# Patient Record
Sex: Female | Born: 1945 | Race: White | Hispanic: No | Marital: Married | Smoking: Former smoker
Health system: Southern US, Community
[De-identification: ages and names within clinical notes are randomized; demographics above are authoritative.]

## PROBLEM LIST (undated history)

## (undated) DIAGNOSIS — Z923 Personal history of irradiation: Secondary | ICD-10-CM

## (undated) DIAGNOSIS — K219 Gastro-esophageal reflux disease without esophagitis: Secondary | ICD-10-CM

## (undated) DIAGNOSIS — M8580 Other specified disorders of bone density and structure, unspecified site: Secondary | ICD-10-CM

## (undated) DIAGNOSIS — M1711 Unilateral primary osteoarthritis, right knee: Secondary | ICD-10-CM

## (undated) DIAGNOSIS — M1990 Unspecified osteoarthritis, unspecified site: Secondary | ICD-10-CM

## (undated) DIAGNOSIS — I447 Left bundle-branch block, unspecified: Secondary | ICD-10-CM

## (undated) DIAGNOSIS — C4491 Basal cell carcinoma of skin, unspecified: Secondary | ICD-10-CM

## (undated) DIAGNOSIS — G8929 Other chronic pain: Secondary | ICD-10-CM

## (undated) DIAGNOSIS — C8330 Diffuse large B-cell lymphoma, unspecified site: Secondary | ICD-10-CM

## (undated) DIAGNOSIS — M549 Dorsalgia, unspecified: Secondary | ICD-10-CM

## (undated) DIAGNOSIS — C8590 Non-Hodgkin lymphoma, unspecified, unspecified site: Secondary | ICD-10-CM

## (undated) DIAGNOSIS — E785 Hyperlipidemia, unspecified: Secondary | ICD-10-CM

## (undated) DIAGNOSIS — K644 Residual hemorrhoidal skin tags: Secondary | ICD-10-CM

## (undated) HISTORY — PX: BASAL CELL CARCINOMA EXCISION: SHX1214

## (undated) HISTORY — DX: Hyperlipidemia, unspecified: E78.5

## (undated) HISTORY — DX: Personal history of irradiation: Z92.3

## (undated) HISTORY — DX: Residual hemorrhoidal skin tags: K64.4

## (undated) HISTORY — DX: Other specified disorders of bone density and structure, unspecified site: M85.80

## (undated) HISTORY — DX: Diffuse large B-cell lymphoma, unspecified site: C83.30

## (undated) HISTORY — DX: Gastro-esophageal reflux disease without esophagitis: K21.9

---

## 1985-12-22 HISTORY — PX: TUBAL LIGATION: SHX77

## 1985-12-22 HISTORY — PX: DILATION AND CURETTAGE OF UTERUS: SHX78

## 1986-12-22 HISTORY — PX: ABDOMINAL HYSTERECTOMY: SHX81

## 2000-09-24 ENCOUNTER — Other Ambulatory Visit: Admission: RE | Admit: 2000-09-24 | Discharge: 2000-09-24 | Payer: Self-pay | Admitting: Family Medicine

## 2002-10-31 ENCOUNTER — Other Ambulatory Visit: Admission: RE | Admit: 2002-10-31 | Discharge: 2002-10-31 | Payer: Self-pay | Admitting: Family Medicine

## 2002-11-21 HISTORY — PX: BREAST BIOPSY: SHX20

## 2002-12-07 ENCOUNTER — Encounter: Admission: RE | Admit: 2002-12-07 | Discharge: 2002-12-07 | Payer: Self-pay | Admitting: Family Medicine

## 2002-12-07 ENCOUNTER — Encounter (INDEPENDENT_AMBULATORY_CARE_PROVIDER_SITE_OTHER): Payer: Self-pay | Admitting: *Deleted

## 2002-12-07 ENCOUNTER — Encounter: Payer: Self-pay | Admitting: Family Medicine

## 2005-03-24 ENCOUNTER — Ambulatory Visit: Payer: Self-pay | Admitting: Family Medicine

## 2005-04-08 ENCOUNTER — Other Ambulatory Visit: Admission: RE | Admit: 2005-04-08 | Discharge: 2005-04-08 | Payer: Self-pay | Admitting: Family Medicine

## 2005-04-08 ENCOUNTER — Ambulatory Visit: Payer: Self-pay | Admitting: Family Medicine

## 2005-04-21 HISTORY — PX: COLONOSCOPY: SHX174

## 2005-04-23 ENCOUNTER — Ambulatory Visit: Payer: Self-pay | Admitting: Internal Medicine

## 2005-04-24 ENCOUNTER — Encounter: Admission: RE | Admit: 2005-04-24 | Discharge: 2005-04-24 | Payer: Self-pay | Admitting: Family Medicine

## 2005-04-30 ENCOUNTER — Ambulatory Visit: Payer: Self-pay | Admitting: Family Medicine

## 2005-05-06 ENCOUNTER — Ambulatory Visit: Payer: Self-pay | Admitting: Family Medicine

## 2005-05-08 ENCOUNTER — Ambulatory Visit: Payer: Self-pay | Admitting: Family Medicine

## 2005-05-09 ENCOUNTER — Ambulatory Visit: Payer: Self-pay | Admitting: Internal Medicine

## 2005-05-09 LAB — HM COLONOSCOPY: HM Colonoscopy: NORMAL

## 2005-06-04 ENCOUNTER — Ambulatory Visit: Payer: Self-pay | Admitting: Family Medicine

## 2005-10-08 ENCOUNTER — Ambulatory Visit: Payer: Self-pay | Admitting: Family Medicine

## 2006-07-01 ENCOUNTER — Encounter: Admission: RE | Admit: 2006-07-01 | Discharge: 2006-07-01 | Payer: Self-pay | Admitting: Family Medicine

## 2006-08-07 ENCOUNTER — Ambulatory Visit: Payer: Self-pay | Admitting: Family Medicine

## 2006-09-04 ENCOUNTER — Ambulatory Visit: Payer: Self-pay | Admitting: Family Medicine

## 2006-09-23 ENCOUNTER — Ambulatory Visit: Payer: Self-pay | Admitting: Family Medicine

## 2007-07-15 ENCOUNTER — Encounter: Admission: RE | Admit: 2007-07-15 | Discharge: 2007-07-15 | Payer: Self-pay | Admitting: Family Medicine

## 2007-07-16 ENCOUNTER — Encounter (INDEPENDENT_AMBULATORY_CARE_PROVIDER_SITE_OTHER): Payer: Self-pay | Admitting: *Deleted

## 2007-08-06 DIAGNOSIS — T7840XA Allergy, unspecified, initial encounter: Secondary | ICD-10-CM | POA: Insufficient documentation

## 2007-09-13 ENCOUNTER — Ambulatory Visit: Payer: Self-pay | Admitting: Family Medicine

## 2007-09-13 DIAGNOSIS — M8580 Other specified disorders of bone density and structure, unspecified site: Secondary | ICD-10-CM | POA: Insufficient documentation

## 2007-09-14 LAB — CONVERTED CEMR LAB
ALT: 20 units/L (ref 0–35)
AST: 24 units/L (ref 0–37)
Albumin: 3.8 g/dL (ref 3.5–5.2)
Alkaline Phosphatase: 58 units/L (ref 39–117)
BUN: 14 mg/dL (ref 6–23)
Basophils Absolute: 0.1 10*3/uL (ref 0.0–0.1)
Basophils Relative: 1.1 % — ABNORMAL HIGH (ref 0.0–1.0)
Bilirubin, Direct: 0.1 mg/dL (ref 0.0–0.3)
CO2: 27 meq/L (ref 19–32)
Calcium: 9 mg/dL (ref 8.4–10.5)
Chloride: 111 meq/L (ref 96–112)
Cholesterol: 161 mg/dL (ref 0–200)
Creatinine, Ser: 0.9 mg/dL (ref 0.4–1.2)
Eosinophils Absolute: 0.1 10*3/uL (ref 0.0–0.6)
Eosinophils Relative: 2 % (ref 0.0–5.0)
GFR calc Af Amer: 82 mL/min
GFR calc non Af Amer: 68 mL/min
Glucose, Bld: 103 mg/dL — ABNORMAL HIGH (ref 70–99)
HCT: 39.5 % (ref 36.0–46.0)
HDL: 41.2 mg/dL (ref >39.0)
Hemoglobin: 13.4 g/dL (ref 12.0–15.0)
LDL Cholesterol: 100 mg/dL — ABNORMAL HIGH (ref 0–99)
Lymphocytes Relative: 28.3 % (ref 12.0–46.0)
MCHC: 34 g/dL (ref 30.0–36.0)
MCV: 90.4 fL (ref 78.0–100.0)
Monocytes Absolute: 0.6 10*3/uL (ref 0.2–0.7)
Monocytes Relative: 8.8 % (ref 3.0–11.0)
Neutro Abs: 3.7 10*3/uL (ref 1.4–7.7)
Neutrophils Relative %: 59.8 % (ref 43.0–77.0)
Platelets: 178 10*3/uL (ref 150–400)
Potassium: 3.8 meq/L (ref 3.5–5.1)
RBC: 4.37 M/uL (ref 3.87–5.11)
RDW: 12.7 % (ref 11.5–14.6)
Sodium: 144 meq/L (ref 135–145)
TSH: 1.58 microintl units/mL (ref 0.35–5.50)
Total Bilirubin: 0.8 mg/dL (ref 0.3–1.2)
Total CHOL/HDL Ratio: 3.9
Total Protein: 6.6 g/dL (ref 6.0–8.3)
Triglycerides: 101 mg/dL (ref 0–149)
VLDL: 20 mg/dL (ref 0–40)
WBC: 6.3 10*3/uL (ref 4.5–10.5)

## 2007-09-27 ENCOUNTER — Ambulatory Visit: Payer: Self-pay | Admitting: Family Medicine

## 2007-09-28 ENCOUNTER — Encounter (INDEPENDENT_AMBULATORY_CARE_PROVIDER_SITE_OTHER): Payer: Self-pay | Admitting: *Deleted

## 2007-09-28 LAB — FECAL OCCULT BLOOD, GUAIAC: Fecal Occult Blood: NEGATIVE

## 2008-03-21 ENCOUNTER — Ambulatory Visit: Payer: Self-pay | Admitting: Internal Medicine

## 2008-03-23 LAB — CONVERTED CEMR LAB
Albumin: 4.1 g/dL (ref 3.5–5.2)
BUN: 13 mg/dL (ref 6–23)
CO2: 26 meq/L (ref 19–32)
Calcium: 9.5 mg/dL (ref 8.4–10.5)
Chloride: 107 meq/L (ref 96–112)
Creatinine, Ser: 0.9 mg/dL (ref 0.4–1.2)
GFR calc Af Amer: 82 mL/min
GFR calc non Af Amer: 67 mL/min
Glucose, Bld: 109 mg/dL — ABNORMAL HIGH (ref 70–99)
HCT: 42.3 % (ref 36.0–46.0)
Hemoglobin: 13.9 g/dL (ref 12.0–15.0)
Phosphorus: 3.2 mg/dL (ref 2.3–4.6)
Potassium: 3.9 meq/L (ref 3.5–5.1)
Sodium: 141 meq/L (ref 135–145)
TSH: 1.62 microintl units/mL (ref 0.35–5.50)

## 2008-03-27 ENCOUNTER — Telehealth: Payer: Self-pay | Admitting: Family Medicine

## 2008-03-29 ENCOUNTER — Ambulatory Visit: Payer: Self-pay | Admitting: Family Medicine

## 2008-03-29 LAB — CONVERTED CEMR LAB
Bilirubin Urine: NEGATIVE
Blood in Urine, dipstick: NEGATIVE
Glucose, Urine, Semiquant: NEGATIVE
Ketones, urine, test strip: NEGATIVE
Nitrite: NEGATIVE
Protein, U semiquant: NEGATIVE
Specific Gravity, Urine: <1.005
Urobilinogen, UA: 0.2
WBC Urine, dipstick: NEGATIVE
pH: 7

## 2008-03-30 ENCOUNTER — Encounter: Payer: Self-pay | Admitting: Family Medicine

## 2008-04-02 ENCOUNTER — Observation Stay (HOSPITAL_COMMUNITY): Admission: EM | Admit: 2008-04-02 | Discharge: 2008-04-03 | Payer: Self-pay | Admitting: Emergency Medicine

## 2008-04-02 ENCOUNTER — Ambulatory Visit: Payer: Self-pay | Admitting: Internal Medicine

## 2008-04-02 ENCOUNTER — Encounter: Payer: Self-pay | Admitting: Family Medicine

## 2008-04-02 ENCOUNTER — Encounter: Payer: Self-pay | Admitting: Internal Medicine

## 2008-04-02 DIAGNOSIS — J309 Allergic rhinitis, unspecified: Secondary | ICD-10-CM | POA: Insufficient documentation

## 2008-04-02 DIAGNOSIS — K219 Gastro-esophageal reflux disease without esophagitis: Secondary | ICD-10-CM | POA: Insufficient documentation

## 2008-04-02 DIAGNOSIS — E785 Hyperlipidemia, unspecified: Secondary | ICD-10-CM | POA: Insufficient documentation

## 2008-04-03 ENCOUNTER — Encounter: Payer: Self-pay | Admitting: Family Medicine

## 2008-04-07 ENCOUNTER — Ambulatory Visit: Payer: Self-pay

## 2008-04-07 ENCOUNTER — Encounter: Payer: Self-pay | Admitting: Family Medicine

## 2008-04-10 ENCOUNTER — Ambulatory Visit: Payer: Self-pay | Admitting: Family Medicine

## 2008-07-21 ENCOUNTER — Encounter: Admission: RE | Admit: 2008-07-21 | Discharge: 2008-07-21 | Payer: Self-pay | Admitting: Family Medicine

## 2008-10-10 ENCOUNTER — Ambulatory Visit: Payer: Self-pay | Admitting: Family Medicine

## 2008-10-11 LAB — CONVERTED CEMR LAB
ALT: 22 units/L (ref 0–35)
AST: 26 units/L (ref 0–37)
Albumin: 3.9 g/dL (ref 3.5–5.2)
Alkaline Phosphatase: 56 units/L (ref 39–117)
BUN: 21 mg/dL (ref 6–23)
Basophils Absolute: 0 10*3/uL (ref 0.0–0.1)
Basophils Relative: 0.6 % (ref 0.0–3.0)
Bilirubin, Direct: 0.1 mg/dL (ref 0.0–0.3)
CO2: 27 meq/L (ref 19–32)
Calcium: 9.1 mg/dL (ref 8.4–10.5)
Chloride: 106 meq/L (ref 96–112)
Cholesterol: 168 mg/dL (ref 0–200)
Creatinine, Ser: 0.9 mg/dL (ref 0.4–1.2)
Eosinophils Absolute: 0.1 10*3/uL (ref 0.0–0.7)
Eosinophils Relative: 0.8 % (ref 0.0–5.0)
GFR calc Af Amer: 82 mL/min
GFR calc non Af Amer: 67 mL/min
Glucose, Bld: 101 mg/dL — ABNORMAL HIGH (ref 70–99)
HCT: 40.6 % (ref 36.0–46.0)
HDL: 37.8 mg/dL — ABNORMAL LOW (ref >39.0)
Hemoglobin: 13.9 g/dL (ref 12.0–15.0)
LDL Cholesterol: 105 mg/dL — ABNORMAL HIGH (ref 0–99)
Lymphocytes Relative: 22.8 % (ref 12.0–46.0)
MCHC: 34.2 g/dL (ref 30.0–36.0)
MCV: 90.3 fL (ref 78.0–100.0)
Monocytes Absolute: 0.7 10*3/uL (ref 0.1–1.0)
Monocytes Relative: 8.8 % (ref 3.0–12.0)
Neutro Abs: 5.2 10*3/uL (ref 1.4–7.7)
Neutrophils Relative %: 67 % (ref 43.0–77.0)
Platelets: 171 10*3/uL (ref 150–400)
Potassium: 4 meq/L (ref 3.5–5.1)
RBC: 4.5 M/uL (ref 3.87–5.11)
RDW: 12.4 % (ref 11.5–14.6)
Sodium: 141 meq/L (ref 135–145)
TSH: 1.28 microintl units/mL (ref 0.35–5.50)
Total Bilirubin: 1 mg/dL (ref 0.3–1.2)
Total CHOL/HDL Ratio: 4.4
Total Protein: 6.9 g/dL (ref 6.0–8.3)
Triglycerides: 125 mg/dL (ref 0–149)
VLDL: 25 mg/dL (ref 0–40)
WBC: 7.8 10*3/uL (ref 4.5–10.5)

## 2008-10-12 LAB — CONVERTED CEMR LAB: Vit D, 1,25-Dihydroxy: 34 (ref 30–89)

## 2009-01-24 ENCOUNTER — Encounter: Payer: Self-pay | Admitting: Family Medicine

## 2009-01-24 ENCOUNTER — Ambulatory Visit: Payer: Self-pay | Admitting: Internal Medicine

## 2009-02-09 ENCOUNTER — Encounter (INDEPENDENT_AMBULATORY_CARE_PROVIDER_SITE_OTHER): Payer: Self-pay | Admitting: *Deleted

## 2009-08-02 ENCOUNTER — Encounter: Admission: RE | Admit: 2009-08-02 | Discharge: 2009-08-02 | Payer: Self-pay | Admitting: Family Medicine

## 2009-08-06 ENCOUNTER — Encounter (INDEPENDENT_AMBULATORY_CARE_PROVIDER_SITE_OTHER): Payer: Self-pay | Admitting: *Deleted

## 2009-10-12 ENCOUNTER — Ambulatory Visit: Payer: Self-pay | Admitting: Family Medicine

## 2009-10-12 LAB — CONVERTED CEMR LAB
Cholesterol, target level: 200 mg/dL
HDL goal, serum: 40 mg/dL
LDL Goal: 130 mg/dL

## 2009-10-16 ENCOUNTER — Encounter: Payer: Self-pay | Admitting: Family Medicine

## 2009-10-16 LAB — CONVERTED CEMR LAB
ALT: 25 units/L (ref 0–35)
AST: 26 units/L (ref 0–37)
Albumin: 4 g/dL (ref 3.5–5.2)
Alkaline Phosphatase: 53 units/L (ref 39–117)
BUN: 22 mg/dL (ref 6–23)
Basophils Absolute: 0 10*3/uL (ref 0.0–0.1)
Basophils Relative: 0.7 % (ref 0.0–3.0)
Bilirubin, Direct: 0 mg/dL (ref 0.0–0.3)
CO2: 30 meq/L (ref 19–32)
Calcium: 9 mg/dL (ref 8.4–10.5)
Chloride: 105 meq/L (ref 96–112)
Cholesterol: 196 mg/dL (ref 0–200)
Creatinine, Ser: 1 mg/dL (ref 0.4–1.2)
Eosinophils Absolute: 0.1 10*3/uL (ref 0.0–0.7)
Eosinophils Relative: 1.3 % (ref 0.0–5.0)
GFR calc non Af Amer: 59.34 mL/min (ref >60)
Glucose, Bld: 96 mg/dL (ref 70–99)
HCT: 39.1 % (ref 36.0–46.0)
HDL: 39.6 mg/dL (ref >39.00)
Hemoglobin: 14 g/dL (ref 12.0–15.0)
LDL Cholesterol: 119 mg/dL — ABNORMAL HIGH (ref 0–99)
Lymphocytes Relative: 27.4 % (ref 12.0–46.0)
Lymphs Abs: 1.9 10*3/uL (ref 0.7–4.0)
MCHC: 35.9 g/dL (ref 30.0–36.0)
MCV: 88.8 fL (ref 78.0–100.0)
Monocytes Absolute: 0.6 10*3/uL (ref 0.1–1.0)
Monocytes Relative: 9 % (ref 3.0–12.0)
Neutro Abs: 4.3 10*3/uL (ref 1.4–7.7)
Neutrophils Relative %: 61.6 % (ref 43.0–77.0)
Platelets: 209 10*3/uL (ref 150.0–400.0)
Potassium: 3.9 meq/L (ref 3.5–5.1)
RBC: 4.41 M/uL (ref 3.87–5.11)
RDW: 12.7 % (ref 11.5–14.6)
Sodium: 143 meq/L (ref 135–145)
TSH: 1.55 microintl units/mL (ref 0.35–5.50)
Total Bilirubin: 0.9 mg/dL (ref 0.3–1.2)
Total CHOL/HDL Ratio: 5
Total Protein: 6.9 g/dL (ref 6.0–8.3)
Triglycerides: 189 mg/dL — ABNORMAL HIGH (ref 0.0–149.0)
VLDL: 37.8 mg/dL (ref 0.0–40.0)
Vit D, 25-Hydroxy: 33 ng/mL (ref 30–89)
WBC: 6.9 10*3/uL (ref 4.5–10.5)

## 2010-04-19 ENCOUNTER — Ambulatory Visit: Payer: Self-pay | Admitting: Family Medicine

## 2010-04-19 LAB — CONVERTED CEMR LAB
ALT: 20 units/L (ref 0–35)
AST: 21 units/L (ref 0–37)
Cholesterol: 175 mg/dL (ref 0–200)
HDL: 37 mg/dL — ABNORMAL LOW (ref >39.00)
LDL Cholesterol: 104 mg/dL — ABNORMAL HIGH (ref 0–99)
Total CHOL/HDL Ratio: 5
Triglycerides: 171 mg/dL — ABNORMAL HIGH (ref 0.0–149.0)
VLDL: 34.2 mg/dL (ref 0.0–40.0)

## 2010-04-22 LAB — CONVERTED CEMR LAB: Vit D, 25-Hydroxy: 70 ng/mL (ref 30–89)

## 2010-08-12 ENCOUNTER — Encounter: Admission: RE | Admit: 2010-08-12 | Discharge: 2010-08-12 | Payer: Self-pay | Admitting: Family Medicine

## 2010-08-12 LAB — HM MAMMOGRAPHY

## 2010-08-15 ENCOUNTER — Encounter: Payer: Self-pay | Admitting: Family Medicine

## 2010-12-06 ENCOUNTER — Ambulatory Visit: Payer: Self-pay | Admitting: Family Medicine

## 2010-12-06 ENCOUNTER — Encounter: Payer: Self-pay | Admitting: Family Medicine

## 2010-12-09 LAB — CONVERTED CEMR LAB
ALT: 18 units/L (ref 0–35)
AST: 22 units/L (ref 0–37)
Albumin: 3.9 g/dL (ref 3.5–5.2)
Alkaline Phosphatase: 66 units/L (ref 39–117)
BUN: 24 mg/dL — ABNORMAL HIGH (ref 6–23)
Basophils Absolute: 0 10*3/uL (ref 0.0–0.1)
Basophils Relative: 0.7 % (ref 0.0–3.0)
Bilirubin, Direct: 0.1 mg/dL (ref 0.0–0.3)
CO2: 28 meq/L (ref 19–32)
Calcium: 9.5 mg/dL (ref 8.4–10.5)
Chloride: 107 meq/L (ref 96–112)
Cholesterol: 172 mg/dL (ref 0–200)
Creatinine, Ser: 0.8 mg/dL (ref 0.4–1.2)
Eosinophils Absolute: 0.1 10*3/uL (ref 0.0–0.7)
Eosinophils Relative: 1.5 % (ref 0.0–5.0)
GFR calc non Af Amer: 76.49 mL/min (ref >60.00)
Glucose, Bld: 101 mg/dL — ABNORMAL HIGH (ref 70–99)
HCT: 39.8 % (ref 36.0–46.0)
HDL: 40.8 mg/dL (ref >39.00)
Hemoglobin: 13.5 g/dL (ref 12.0–15.0)
LDL Cholesterol: 106 mg/dL — ABNORMAL HIGH (ref 0–99)
Lymphocytes Relative: 28.7 % (ref 12.0–46.0)
Lymphs Abs: 2.1 10*3/uL (ref 0.7–4.0)
MCHC: 33.9 g/dL (ref 30.0–36.0)
MCV: 89.7 fL (ref 78.0–100.0)
Monocytes Absolute: 0.7 10*3/uL (ref 0.1–1.0)
Monocytes Relative: 9.8 % (ref 3.0–12.0)
Neutro Abs: 4.3 10*3/uL (ref 1.4–7.7)
Neutrophils Relative %: 59.3 % (ref 43.0–77.0)
Platelets: 193 10*3/uL (ref 150.0–400.0)
Potassium: 4.5 meq/L (ref 3.5–5.1)
RBC: 4.43 M/uL (ref 3.87–5.11)
RDW: 13.9 % (ref 11.5–14.6)
Sodium: 144 meq/L (ref 135–145)
TSH: 1.87 microintl units/mL (ref 0.35–5.50)
Total Bilirubin: 0.9 mg/dL (ref 0.3–1.2)
Total CHOL/HDL Ratio: 4
Total Protein: 6.6 g/dL (ref 6.0–8.3)
Triglycerides: 127 mg/dL (ref 0.0–149.0)
VLDL: 25.4 mg/dL (ref 0.0–40.0)
Vit D, 25-Hydroxy: 66 ng/mL (ref 30–89)
WBC: 7.3 10*3/uL (ref 4.5–10.5)

## 2010-12-12 ENCOUNTER — Ambulatory Visit: Payer: Self-pay | Admitting: Family Medicine

## 2010-12-12 DIAGNOSIS — M19049 Primary osteoarthritis, unspecified hand: Secondary | ICD-10-CM | POA: Insufficient documentation

## 2010-12-27 ENCOUNTER — Encounter: Payer: Self-pay | Admitting: Family Medicine

## 2011-01-21 NOTE — Letter (Signed)
Summary: Results Follow up Letter  [facility]  [address] [place], [place] [address]   Phone: [phone]  Fax: [phone]    08/15/2010 MRN: [id number]  Tara Sloan [address] [place]  [address]  Dear [name],  The following are the results of your recent test(s):  Test         Result    Pap Smear:        Normal _____  Not Normal _____ Comments: ______________________________________________________ Cholesterol: LDL(Bad cholesterol):         Your goal is less than:         HDL (Good cholesterol):       Your goal is more than: Comments:  ______________________________________________________ Mammogram:        Normal __X___  Not Normal _____ Comments: Please repeat in one year.  ___________________________________________________________________ Hemoccult:        Normal _____  Not normal _______ Comments:    _____________________________________________________________________ Other Tests:    We routinely do not discuss normal results over the telephone.  If you desire a copy of the results, or you have any questions about this information we can discuss them at your next office visit.   Sincerely,

## 2011-01-21 NOTE — Assessment & Plan Note (Signed)
Summary: CPX/CLE   Vital Signs:  Patient profile:   65 year old female Height:      63 inches Weight:      150.25 pounds BMI:     26.71 Temp:     97.8 degrees F oral Pulse rate:   72 / minute Pulse rhythm:   regular BP sitting:   118 / 80  (left arm) Cuff size:   regular  Vitals Entered By: Lewanda Rife LPN (October 12, 2009 9:50 AM)  History of Present Illness: here for health mt exam and to rev chronic med issues has been feeling good   wt is up 4 lb bp 118/80 today- good   mam nl 8/10 self exam - no lumps   had hyst - no gyn symptoms at all   colonosc 5/06-- not due for 10 years check yet   dexa 2/10- osteopenia  ca and vit D- is good with that  is exercising at [facility]    chol- on zocor and diet- fairly well controlled  due for check Last Lipid ProfileCholesterol: 168 (10/10/2008 10:05:00 AM)HDL:  37.8 (10/10/2008 10:05:00 AM)LDL:  105 (10/10/2008 10:05:00 AM)Triglycerides:  Last Liver profileSGOT:  26 (10/10/2008 10:05:00 AM)SPGT:  22 (10/10/2008 10:05:00 AM)T. Bili:  1.0 (10/10/2008 10:05:00 AM)Alk Phos:  56 (10/10/2008 10:05:00 AM)   Td 03 up to date  flu shot - wants one today     Lipid Management History:      Positive NCEP/ATP III risk factors include female age 46 years old or older, HDL cholesterol less than 40, and family history for ischemic heart disease (males less than 4 years old).  Negative NCEP/ATP III risk factors include no history of early menopause without estrogen hormone replacement, non-tobacco-user status, non-hypertensive, no ASHD (atherosclerotic heart disease), no prior stroke/TIA, no peripheral vascular disease, and no history of aortic aneurysm.    Allergies (verified): No Known Drug Allergies  Review of Systems General:  Denies fatigue, fever, loss of appetite, and malaise. Eyes:  Denies blurring and eye pain. CV:  Denies chest pain or discomfort and palpitations. Resp:  Denies cough, shortness of breath, and wheezing. GI:   Denies abdominal pain, change in bowel habits, and indigestion. GU:  Denies abnormal vaginal bleeding, discharge, and dysuria. MS:  Denies joint pain and joint swelling. Derm:  Denies itching, lesion(s), poor wound healing, and rash. Neuro:  Denies numbness and tingling. Psych:  Denies anxiety and depression. Endo:  Denies cold intolerance, excessive thirst, excessive urination, and heat intolerance. Heme:  Denies abnormal bruising and bleeding.  Physical Exam  General:  Well-developed,well-nourished,in no acute distress; alert,appropriate and cooperative throughout examination Head:  normocephalic, atraumatic, and no abnormalities observed.   Eyes:  vision grossly intact, pupils equal, pupils round, and pupils reactive to light.  no conjunctival pallor, injection or icterus  Ears:  R ear normal and L ear normal.   Nose:  no nasal discharge.   Mouth:  pharynx pink and moist.   Neck:  supple with full rom and no masses or thyromegally, no JVD or carotid bruit  Breasts:  No mass, nodules, thickening, tenderness, bulging, retraction, inflamation, nipple discharge or skin changes noted.   Lungs:  Normal respiratory effort, chest expands symmetrically. Lungs are clear to auscultation, no crackles or wheezes. Heart:  Normal rate and regular rhythm. S1 and S2 normal without gallop, murmur, click, rub or other extra sounds. Abdomen:  Bowel sounds positive,abdomen soft and non-tender without masses, organomegaly or hernias noted.  no renal bruits  Msk:  No deformity or scoliosis noted of thoracic or lumbar spine.  no acute joint changes  Pulses:  R and L carotid,radial,femoral,dorsalis pedis and posterior tibial pulses are full and equal bilaterally Extremities:  No clubbing, cyanosis, edema, or deformity noted with normal full range of motion of all joints.   Neurologic:  sensation intact to light touch, gait normal, and DTRs symmetrical and normal.   Skin:  Intact without suspicious lesions or  rashes some lentigos  Cervical Nodes:  No lymphadenopathy noted Axillary Nodes:  No palpable lymphadenopathy Inguinal Nodes:  No significant adenopathy Psych:  normal affect, talkative and pleasant    Impression & Recommendations:  Problem # 1:  HEALTH MAINTENANCE EXAM (ICD-V70.0) Assessment Comment Only reviewed health habits including diet, exercise and skin cancer prevention reviewed health maintenance list and family history commended on good habits  lab today for wellness and lipid flu shot  Orders: Venipuncture (09811) TLB-Lipid Panel (80061-LIPID) TLB-BMP (Basic Metabolic Panel-BMET) (80048-METABOL) TLB-Hepatic/Liver Function Pnl (80076-HEPATIC) TLB-CBC Platelet - w/Differential (85025-CBCD) TLB-TSH (Thyroid Stimulating Hormone) (84443-TSH)  Problem # 2:  HYPERLIPIDEMIA (ICD-272.4) Assessment: Unchanged  has been well controlled with zocor disc goals for hdl and ldl rev low sat fat diet Her updated medication list for this problem includes:    Zocor 40 Mg Tabs (Simvastatin) .Marland Kitchen... 1 by mouth once daily  Orders: Venipuncture (91478) TLB-Lipid Panel (80061-LIPID) TLB-BMP (Basic Metabolic Panel-BMET) (80048-METABOL) TLB-Hepatic/Liver Function Pnl (80076-HEPATIC) TLB-CBC Platelet - w/Differential (85025-CBCD) TLB-TSH (Thyroid Stimulating Hormone) (84443-TSH)  Labs Reviewed: SGOT: 26 (10/10/2008)   SGPT: 22 (10/10/2008)   HDL:37.8 (10/10/2008), 41.2 (09/13/2007)  LDL:105 (10/10/2008), 100 (09/13/2007)  Chol:168 (10/10/2008), 161 (09/13/2007)  Trig:125 (10/10/2008), 101 (09/13/2007)  Labs Reviewed: SGOT: 26 (10/10/2008)   SGPT: 22 (10/10/2008)  Lipid Goals: Chol Goal: 200 (10/12/2009)   HDL Goal: 40 (10/12/2009)   LDL Goal: 130 (10/12/2009)   TG Goal: 150 (10/12/2009)  10 Yr Risk Heart Disease: 11 %   HDL:37.8 (10/10/2008), 41.2 (09/13/2007)  LDL:105 (10/10/2008), 100 (09/13/2007)  Chol:168 (10/10/2008), 161 (09/13/2007)  Trig:125 (10/10/2008), 101  (09/13/2007)  Problem # 3:  OSTEOPENIA (ICD-733.90) Assessment: Unchanged  is up to date on dexa disc rec for ca and vit D and exercise  lab today- vit D and tsh  Orders: Venipuncture (29562) TLB-TSH (Thyroid Stimulating Hormone) (84443-TSH) T-Vitamin D (25-Hydroxy) (13086-57846) Specimen Handling (96295)  Bone Density: abnormal (01/24/2009) Vit D:34 (10/10/2008)  Problem # 4:  GERD (ICD-530.81) Assessment: Unchanged well controlled with omeprazole without change  good diet  Her updated medication list for this problem includes:    Omeprazole 20 Mg Cpdr (Omeprazole) .Marland Kitchen... 1 by mouth once daily  Complete Medication List: 1)  Zocor 40 Mg Tabs (Simvastatin) .Marland Kitchen.. 1 by mouth once daily 2)  Multi-vitamin Tabs (Multiple vitamin) .... One by mouth qd 3)  Calcium Plus D  4)  Omeprazole 20 Mg Cpdr (Omeprazole) .Marland Kitchen.. 1 by mouth once daily 5)  Adult Aspirin Low Strength 81 Mg Tbdp (Aspirin) .... Take one by mouth daily 6)  Tylenol 325 Mg Tabs (Acetaminophen) .... Otc as directed. 7)  Goodys Body Pain 500-325 Mg Pack (Aspirin-acetaminophen) .... Otc as directed. 8)  Advil 200 Mg Tabs (Ibuprofen) .... Otc as directed.  Other Orders: Flu Vaccine 35yrs + [phone]) Admin 1st Vaccine (24401) Admin 1st Vaccine (State) [phone])  Lipid Assessment/Plan:      Based on NCEP/ATP III, the patient's risk factor category is "0-1 risk factors".  The patient's lipid goals are as follows: Total cholesterol goal is 200;  LDL cholesterol goal is 130; HDL cholesterol goal is 40; Triglyceride goal is 150.  Her LDL cholesterol goal has been met.     Patient Instructions: 1)  flu shot today  2)  keep up the good work with diet and exercise  3)  no change in medicines 4)  the current recommendation for calcium intake is 1200-1500 mg daily with 1000 IU of vitamin D  5)  if you are interested in shingles vaccine in future- check with your insurance co about coverage and call us to schedule   Prescriptions: OMEPRAZOLE 20 MG  CPDR (OMEPRAZOLE) 1 by mouth once daily  #30 x 11   Entered and Authorized by:   Judith Part MD   Signed by:   Judith Part MD on 10/12/2009   Method used:   Print then Give to Patient   RxID:   [id number] ZOCOR 40 MG TABS (SIMVASTATIN) 1 by mouth once daily  #30 x 11   Entered and Authorized by:   Judith Part MD   Signed by:   Judith Part MD on 10/12/2009   Method used:   Print then Give to Patient   RxID:   [id number]   Current Allergies (reviewed today): No known allergies      Influenza Vaccine    Vaccine Type: Fluvax 3+    Site: left deltoid    Mfr: GlaxoSmithKline    Dose: 0.5 ml    Route: IM    Given by: Lewanda Rife LPN    Exp. Date: 06/20/2010    Lot #: [id number]    VIS given: 07/15/07 version given October 12, 2009.  Flu Vaccine Consent Questions    Do you have a history of severe allergic reactions to this vaccine? no    Any prior history of allergic reactions to egg and/or gelatin? no    Do you have a sensitivity to the preservative Thimersol? no    Do you have a past history of Guillan-Barre Syndrome? no    Do you currently have an acute febrile illness? no    Have you ever had a severe reaction to latex? no    Vaccine information given and explained to patient? yes    Are you currently pregnant? no

## 2011-01-21 NOTE — Assessment & Plan Note (Signed)
Summary: CPX/PAP/HEA   Vital Signs:  Patient Profile:   65 Years Old Female Weight:      148 pounds Temp:     97.7 degrees F oral Pulse rate:   68 / minute Pulse rhythm:   regular BP sitting:   130 / 70  (left arm) Cuff size:   regular  Vitals Entered By: Lowella Petties (September 13, 2007 9:26 AM)                 Chief Complaint:  30 minute check up.  History of Present Illness: has had a good year wt and bp are stable no new family hx no gyn problems no breast lumps on self exam mamm was nl had osteopenia in 4/06 dexa- is on Ca and vit D still goes to [employer] for exercise  needs refil on zocor- good diet is due for labs- is fasting no chest pain, sob or any new sympt  takes zantac for occas heartburn if she eats the wrong thing   Current Allergies: No known allergies    Family History:    Father: deceased from MI age 98    Mother: deceased age 30- CVA    Siblings:     P aunt breast ca    cholesterol elevation in family   Risk Factors:  Colonoscopy History:     Date of Last Colonoscopy:  05/09/2005    Results:  normal    Review of Systems      See HPI  General      Denies chills, fatigue, fever, and loss of appetite.  Eyes      Denies blurring.  CV      Denies chest pain or discomfort, palpitations, and shortness of breath with exertion.  Resp      Denies cough.  GI      Denies bloody stools and change in bowel habits.  GU      Denies discharge and dysuria.  Derm      Denies changes in color of skin and rash.      some SK moles that are scaley  Neuro      Denies numbness.  Psych      mood has been good  Endo      Denies excessive thirst and excessive urination.   Physical Exam  General:     Well-developed,well-nourished,in no acute distress; alert,appropriate and cooperative throughout examination Head:     Normocephalic and atraumatic without obvious abnormalities. No apparent alopecia or balding. Eyes:     vision  grossly intact, pupils equal, pupils round, and pupils reactive to light.   Ears:     R ear normal and L ear normal.   Nose:     no nasal discharge.   Mouth:     pharynx pink and moist.   Neck:     No deformities, masses, or tenderness noted.supple, full ROM, no JVD, and no carotid bruits.   Breasts:     No mass, nodules, thickening, tenderness, bulging, retraction, inflamation, nipple discharge or skin changes noted.   Lungs:     Normal respiratory effort, chest expands symmetrically. Lungs are clear to auscultation, no crackles or wheezes. Heart:     Normal rate and regular rhythm. S1 and S2 normal without gallop, murmur, click, rub or other extra sounds. Abdomen:     Bowel sounds positive,abdomen soft and non-tender without masses, organomegaly or hernias noted. Msk:     No deformity or scoliosis noted of thoracic or  lumbar spine.  no acute joint changes Pulses:     R and L carotid,radial,femoral,dorsalis pedis and posterior tibial pulses are full and equal bilaterally Extremities:     No clubbing, cyanosis, edema, or deformity noted with normal full range of motion of all joints.   Neurologic:     sensation intact to light touch, gait normal, and DTRs symmetrical and normal.   Skin:     turgor normal, color normal, and no rashes.   Cervical Nodes:     No lymphadenopathy noted Axillary Nodes:     No palpable lymphadenopathy Inguinal Nodes:     No significant adenopathy Psych:     nl affect, pleasant    Impression & Recommendations:  Problem # 1:  HEALTH MAINTENANCE EXAM (ICD-V70.0) overall great health habits incl diet/exercise and skin ca prev reviewed health mt list and fam hx pt may consider zostavax in future when it is availible  Orders: Venipuncture ([address]) TLB-BMP (Basic Metabolic Panel-BMET) (80048-METABOL) TLB-Hepatic/Liver Function Pnl (80076-HEPATIC) TLB-CBC Platelet - w/Differential (85025-CBCD) TLB-TSH (Thyroid Stimulating Hormone)  (84443-TSH)   Problem # 2:  HYPERCHOLESTEROLEMIA (ICD-272.0) will check fasting lipids and ast/alt continue zocor and good diet Her updated medication list for this problem includes:    Zocor 40 Mg Tabs (Simvastatin) .Marland Kitchen... 1 by mouth qd  Orders: Venipuncture (60454) TLB-Lipid Panel (80061-LIPID)   Problem # 3:  OSTEOPENIA (ICD-733.90) wants to wait another year for dexa rec ca vit D and continue exercise   Complete Medication List: 1)  Zocor 40 Mg Tabs (Simvastatin) .Marland Kitchen.. 1 by mouth qd 2)  Multi-vitamin Tabs (Multiple vitamin) .... One by mouth qd 3)  Calcium Plus D    Patient Instructions: 1)  keep up great diet and exercise 2)  the recommendation for calcium is 1200- 1500 mg daily and vitamin D 800 International Units daily    Prescriptions: ZOCOR 40 MG TABS (SIMVASTATIN) 1 by mouth qd  #30 x 11   Entered and Authorized by:   Judith Part MD   Signed by:   Judith Part MD on 09/13/2007   Method used:   Print then Give to Patient   RxID:   [id number]  ] Prior Medications: ZOCOR 40 MG TABS (SIMVASTATIN) 1 by mouth qd MULTI-VITAMIN   TABS (MULTIPLE VITAMIN) one by mouth qd CALCIUM PLUS D ()  Current Allergies: No known allergies     Preventive Care Screening  Colonoscopy:    Date:  05/09/2005    Next Due:  05/2010    Results:  normal   Bone Density:    Date:  03/22/2005    Results:  abnormal std dev     breast exam done 9/08

## 2011-01-21 NOTE — Assessment & Plan Note (Signed)
Summary: PAP SMEAR AND CPX/CLE   Vital Signs:  Patient Profile:   65 Years Old Female Height:     63 inches Weight:      146 pounds BMI:     25.96 Temp:     97.8 degrees F oral Pulse rate:   80 / minute Pulse rhythm:   regular BP sitting:   124 / 78  (left arm) Cuff size:   regular  Vitals Entered By: Liane Comber (October 10, 2008 9:46 AM)                 PCP:  [facility]  Chief Complaint:  cpx and pap.  History of Present Illness: is doing well and had a good year  is eating healthy and exercising and taking care of herself   had hyst for fibroids  no abn pap past  no gyn symptoms  needs breast exam-- no lumps on self exam, had nl mam in summer   is taking ca and vit D   is fasting for labs       Current Allergies (reviewed today): No known allergies   Past Medical History:    Reviewed history from 04/02/2008 and no changes required:       Hyperlipidemia       Osteopenia        GERD       Allergic rhinitis  Past Surgical History:    Reviewed history from 04/10/2008 and no changes required:       GYN surgery- D % C (1987)       Hysterectomy- partial, fibroids (1988)       Tubal ligation (1987)       Dexa- osteopenia (08/2000),   osteopenia (11/2002), stable (03/2005)       Breast biopsy- neg (11/2002)       Colonoscopy- ext hemorrhoids (04/2005)       hospital- chest pain/ruled out for MI 4/09       nl nuclear stress test 4/09   Family History:    Reviewed history from 09/13/2007 and no changes required:       Father: deceased from MI age 33       Mother: deceased age 92- CVA       Siblings:        P aunt breast ca       cholesterol elevation in family         Social History:    Reviewed history from 04/02/2008 and no changes required:       Marital Status: Married       Children: 2       Former Smoker       Alcohol use-no       exercise- going to [employer]    Risk Factors:  Colonoscopy History:     Date of Last Colonoscopy:   05/09/2005    Results:  normal    Review of Systems  General      Denies fatigue, loss of appetite, and malaise.  Eyes      Denies blurring and eye pain.  CV      Denies chest pain or discomfort, lightheadness, palpitations, and shortness of breath with exertion.  Resp      Denies cough and wheezing.  GI      Denies abdominal pain, bloody stools, and change in bowel habits.  GU      Denies discharge and dysuria.  MS      Denies joint redness  and joint swelling.  Derm      Denies itching, lesion(s), and rash.  Neuro      Denies numbness and tingling.  Psych      mood is ok   Endo      Denies cold intolerance, excessive thirst, excessive urination, and heat intolerance.  Heme      Denies abnormal bruising and bleeding.   Physical Exam  General:     Well-developed,well-nourished,in no acute distress; alert,appropriate and cooperative throughout examination Head:     normocephalic, atraumatic, and no abnormalities observed.   Eyes:     vision grossly intact, pupils equal, pupils round, and pupils reactive to light.   Nose:     no nasal discharge.   Mouth:     pharynx pink and moist.   Neck:     supple with full rom and no masses or thyromegally, no JVD or carotid bruit  Chest Wall:     No deformities, masses, or tenderness noted. Breasts:     No mass, nodules, thickening, tenderness, bulging, retraction, inflamation, nipple discharge or skin changes noted.   Lungs:     Normal respiratory effort, chest expands symmetrically. Lungs are clear to auscultation, no crackles or wheezes. Heart:     Normal rate and regular rhythm. S1 and S2 normal without gallop, murmur, click, rub or other extra sounds. Abdomen:     Bowel sounds positive,abdomen soft and non-tender without masses, organomegaly or hernias noted.  no renal bruits  Msk:     No deformity or scoliosis noted of thoracic or lumbar spine.  no acute joint changes  Pulses:     R and L  carotid,radial,femoral,dorsalis pedis and posterior tibial pulses are full and equal bilaterally Extremities:     No clubbing, cyanosis, edema, or deformity noted with normal full range of motion of all joints.   Neurologic:     sensation intact to light touch, gait normal, and DTRs symmetrical and normal.   Skin:     Intact without suspicious lesions or rashes some lentigos  Cervical Nodes:     No lymphadenopathy noted Axillary Nodes:     No palpable lymphadenopathy Inguinal Nodes:     No significant adenopathy Psych:     normal affect, talkative and pleasant     Impression & Recommendations:  Problem # 1:  HEALTH MAINTENANCE EXAM (ICD-V70.0) Assessment: Comment Only reviewed health habits including diet, exercise and skin cancer prevention reviewed health maintenance list and family history commended on good habits  flu shot today  Orders: Venipuncture (13086) TLB-Lipid Panel (80061-LIPID) TLB-BMP (Basic Metabolic Panel-BMET) (80048-METABOL) TLB-CBC Platelet - w/Differential (85025-CBCD) TLB-Hepatic/Liver Function Pnl (80076-HEPATIC) TLB-TSH (Thyroid Stimulating Hormone) (84443-TSH)   Problem # 2:  HYPERLIPIDEMIA (ICD-272.4) Assessment: Unchanged has been well controlled with statin and diet  labs today Her updated medication list for this problem includes:    Zocor 40 Mg Tabs (Simvastatin) .Marland Kitchen... 1 by mouth once daily  Labs Reviewed: Chol: 161 (09/13/2007)   HDL: 41.2 (09/13/2007)   LDL: 100 (09/13/2007)   TG: 101 (09/13/2007) SGOT: 24 (09/13/2007)   SGPT: 20 (09/13/2007)   Problem # 3:  OSTEOPENIA (ICD-733.90) Assessment: Unchanged rev rec for ca and D and exercise  will check vit D level today sched f/u dexa in the spring  Orders: Radiology Referral (Radiology) T-Vitamin D (25-Hydroxy) [phone])   Complete Medication List: 1)  Zocor 40 Mg Tabs (Simvastatin) .Marland Kitchen.. 1 by mouth once daily 2)  Multi-vitamin Tabs (Multiple vitamin) .... One by mouth  qd 3)  Calcium Plus D  4)  Omeprazole 20 Mg Cpdr (Omeprazole) .Marland Kitchen.. 1 by mouth once daily 5)  Adult Aspirin Low Strength 81 Mg Tbdp (Aspirin) .... Take one by mouth daily  Other Orders: Admin 1st Vaccine ([address]) Flu Vaccine 66yrs + [phone])   Patient Instructions: 1)  we will set up dexa for spring at check out 2)  the current recommendation for calcium intake is 1200-1500 mg daily with [phone] IU of vitamin D  3)  flu shot today 4)  labs today 5)  no change in medicines  6)  keep up the good work with diet and exercise    Prescriptions: OMEPRAZOLE 20 MG  CPDR (OMEPRAZOLE) 1 by mouth once daily  #30 x 11   Entered and Authorized by:   Judith Part MD   Signed by:   Judith Part MD on 10/10/2008   Method used:   Print then Give to Patient   RxID:   [id number] ZOCOR 40 MG TABS (SIMVASTATIN) 1 by mouth once daily  #30 x 11   Entered and Authorized by:   Judith Part MD   Signed by:   Judith Part MD on 10/10/2008   Method used:   Print then Give to Patient   RxID:   [id number]  ]  Preventive Care Screening  Breast Exam:    Date:  10/10/2008    Results:  Normal  Last Flu Shot:    Date:  10/10/2008    Results:  Fluvax 3+  Colonoscopy:    Date:  05/09/2005    Next Due:  05/2015    Results:  normal        Flu Vaccine Consent Questions     Do you have a history of severe allergic reactions to this vaccine? no    Any prior history of allergic reactions to egg and/or gelatin? no    Do you have a sensitivity to the preservative Thimersol? no    Do you have a past history of Guillan-Barre Syndrome? no    Do you currently have an acute febrile illness? no    Have you ever had a severe reaction to latex? no    Vaccine information given and explained to patient? yes    Are you currently pregnant? no    Lot Number:[id number]   Exp Date:06/20/2009   Site Given  Left Deltoid IMcflu

## 2011-01-21 NOTE — Letter (Signed)
Summary: Results Follow up Letter  [place] at [facility]  [address] [place], [place] [address]   Phone: [phone]  Fax: [phone]    07/16/2007 MRN: [id number]   Tara Sloan Darco [address] [place]  [address]  Dear [name],  The following are the results of your recent test(s):  Test         Result    Pap Smear:        Normal _____  Not Normal _____ Comments: ______________________________________________________ Cholesterol: LDL(Bad cholesterol):         Your goal is less than:         HDL (Good cholesterol):       Your goal is more than: Comments:  ______________________________________________________ Mammogram:        Normal _____  Not Normal _____ Comments:  ___________________________________________________________________ Hemoccult:        Normal _____  Not normal _______ Comments:    _____________________________________________________________________ Other Tests:    We routinely do not discuss normal results over the telephone.  If you desire a copy of the results, or you have any questions about this information we can discuss them at your next office visit.   Sincerely,

## 2011-01-21 NOTE — Assessment & Plan Note (Signed)
Summary: Weakness, nauseated, and nervousness   Vital Signs:  Patient Profile:   65 Years Old Female Weight:      146.13 pounds Temp:     97.8 degrees F oral Pulse rate:   76 / minute Pulse rhythm:   regular Resp:     20 per minute BP sitting:   148 / 80  (left arm) Cuff size:   regular  Vitals Entered By: Wandra Mannan (March 21, 2008 3:29 PM)                 PCP:  [facility]  Chief Complaint:  weakness, nauseated, and nervousness.  History of Present Illness: Some came into their office sick and then she started feeling sick about 3-4 days later. Chills and no fever. Feels like someone poured a bucket of ice water over her head and then she just feels now that she is weak and doesn't feel well. Just has vague type sx's and the malaise and fatigue. Denies any type pain.    Prior Medication List:  ZOCOR 40 MG TABS (SIMVASTATIN) 1 by mouth qd MULTI-VITAMIN   TABS (MULTIPLE VITAMIN) one by mouth qd * CALCIUM PLUS D    Current Allergies (reviewed today): No known allergies   Past Surgical History:    Reviewed history from 08/06/2007 and no changes required:       GYN surgery- D % C (1987)       Hysterectomy- partial, fibroids (1988)       Tubal ligation (1987)       Dexa- osteopenia (08/2000),   osteopenia (11/2002), stable (03/2005)       Breast biopsy- neg (11/2002)       Colonoscopy- ext hemorrhoids (04/2005)   Family History:    Reviewed history from 09/13/2007 and no changes required:       Father: deceased from MI age 57       Mother: deceased age 74- CVA       Siblings:        P aunt breast ca       cholesterol elevation in family  Social History:    Reviewed history from 08/06/2007 and no changes required:       Marital Status: Married       Children: 2       Occupation:     Review of Systems       The patient complains of severe indigestion/heartburn.  The patient denies fever, decreased hearing, hoarseness, chest pain, syncope, dyspnea on  exhertion, peripheral edema, prolonged cough, and abdominal pain.         The indigestion is not severe but it is present and is relieved with Zantac . No vomiting. No diarrhea.      Impression & Recommendations:  Problem # 1:  MALAISE AND FATIGUE (ICD-780.79) Continue treating the sx's - Drink increased fluids and eat bland diet. Lab today to check thyroid and H/H. Call if sx's change or worsen. Orders: Venipuncture (81191) TLB-Renal Function Panel (80069-RENAL) TLB-TSH (Thyroid Stimulating Hormone) (84443-TSH) TLB-Hematocrit (Hct) (85013-HCT) TLB-Hemoglobin (Hgb) (85018-HGB)   Complete Medication List: 1)  Zocor 40 Mg Tabs (Simvastatin) .Marland Kitchen.. 1 by mouth qd 2)  Multi-vitamin Tabs (Multiple vitamin) .... One by mouth qd 3)  Calcium Plus D    Patient Instructions: 1)  Continue treating the sx's - Drink increased fluids and eat bland diet. Lab today to check thyroid and H/H. 2)  Call if sx's change or worsen. 3)  Avoid caffeine in  diet.     ] Current Allergies (reviewed today): No known allergies  Current Medications (including changes made in today's visit):  ZOCOR 40 MG TABS (SIMVASTATIN) 1 by mouth qd MULTI-VITAMIN   TABS (MULTIPLE VITAMIN) one by mouth qd * CALCIUM PLUS D   Appended Document: Weakness, nauseated, and nervousness     Vital Signs:  Patient Profile:   65 Years Old Female Weight:      146.13 pounds Temp:     98.7 degrees F oral Pulse rate:   76 / minute Pulse rhythm:   regular Resp:     20 per minute BP sitting:   148 / 80  (left arm) Cuff size:   regular                  Visit Type:  Acute PCP:  [facility]  Chief Complaint:  weakness and nauseated and nervousness.  History of Present Illness: Patient reports that she had co-workers who came in sick and then she started feeling sick about 3-4 days later. Chills, but no fever. Feels like someone poured a bucket of ice water over her head and then she just feels now that she is weak  and doesn't feel well. Just has fague type sx's of malaise and fatigue. Denies any type pain.    Prior Medication List:  ZOCOR 40 MG TABS (SIMVASTATIN) 1 by mouth qd MULTI-VITAMIN   TABS (MULTIPLE VITAMIN) one by mouth qd * CALCIUM PLUS D  OMEPRAZOLE 20 MG  CPDR (OMEPRAZOLE) 1 by mouth qd   Current Allergies (reviewed today): No known allergies   Past Surgical History:    Reviewed history from 08/06/2007 and no changes required:       GYN surgery- D % C (1987)       Hysterectomy- partial, fibroids (1988)       Tubal ligation (1987)       Dexa- osteopenia (08/2000),   osteopenia (11/2002), stable (03/2005)       Breast biopsy- neg (11/2002)       Colonoscopy- ext hemorrhoids (04/2005)   Family History:    Reviewed history from 09/13/2007 and no changes required:       Father: deceased from MI age 10       Mother: deceased age 78- CVA       Siblings:        P aunt breast ca       cholesterol elevation in family  Social History:    Reviewed history from 04/02/2008 and no changes required:       Marital Status: Married       Children: 2       Occupation:     Review of Systems       The patient complains of severe indigestion/heartburn.  The patient denies fever, decreased hearing, hoarseness, chest pain, syncope, dyspnea on exhertion, peripheral edema, prolonged cough, and abdominal pain.         The indigestion is not severe and is relieved by Zantac. No vomiting or diarrhea.   Physical Exam  General:     alert, well-developed, well-nourished, well-hydrated, appropriate dress, normal appearance, healthy-appearing, cooperative to examination, and good hygiene.   Eyes:     pupils equal, pupils round, pupils reactive to light, and pupils react to accomodation.   Nose:     no external deformity, no external erythema, no nasal discharge, and no mucosal edema.   Mouth:     no gingival abnormalities, pharynx pink and  moist, no erythema, no postnasal drip, and no lesions.    Neck:     supple and full ROM.   Lungs:     normal respiratory effort, no accessory muscle use, and normal breath sounds.   Heart:     normal rate and regular rhythm.   Abdomen:     soft, non-tender, normal bowel sounds, no distention, no guarding, and no rigidity.   Neurologic:     alert & oriented X3, cranial nerves III-XII intact, strength normal in all extremities, and sensation intact to light touch.   Skin:     turgor normal, color normal, no rashes, no suspicious lesions, and no ecchymoses.   Psych:     memory intact for recent and remote, normally interactive, good eye contact, not anxious appearing, and not depressed appearing.      Impression & Recommendations:  Problem # 1:  MALAISE AND FATIGUE (ICD-780.79) Continue treating the sx's- drink increased fluids and eat a bland diet. Labs today to check renal, H/H and TSH. Call if sx's worsen or are not improving in 2-3 days.      Complete Medication List: 1)  Zocor 40 Mg Tabs (Simvastatin) .Marland Kitchen.. 1 by mouth qd 2)  Multi-vitamin Tabs (Multiple vitamin) .... One by mouth qd 3)  Calcium Plus D  4)  Omeprazole 20 Mg Cpdr (Omeprazole) .Marland Kitchen.. 1 by mouth qd   Patient Instructions: 1)  Continue treating the sx's- drink increased fluids and eat a bland diet. 2)  Labs today to check renal profile, H/H and TSH. Dx code: 780.79 3)  Call if sx's worsen or are not improving in 2-3 days. 4)  Avoid caffeine in diet as much as possible.    ]

## 2011-01-23 NOTE — Assessment & Plan Note (Signed)
Summary: CPX/RI   Vital Signs:  Patient profile:   65 year old female Height:      63.5 inches Weight:      145.75 pounds BMI:     25.51 Temp:     98.3 degrees F oral Pulse rate:   80 / minute Pulse rhythm:   regular BP sitting:   114 / 76  (left arm) Cuff size:   regular  Vitals Entered By: Lewanda Rife LPN (December 12, 2010 9:44 AM) CC: CPX LMP hyst, CHF Management   History of Present Illness: here for check up of chronic med problems and also to rev health mt list   is feeling good but feeling her age as well  aches and pains and some arthritis  arthritis inher neck and hands  thinks she is loosing muscle mass in hands - and ? swelling  seldom gets numbness in fingers- not at work   is eating low cholesterol healthy food   wt is down 5 lb with bmi of 25  bp great 114/76  mam 8/11 nl  self exam -- no lumps or changes   colonosc 5/06 nl -- 10 y f/.u rec no bowel changes   Td 03 flu shot -- has not had one -- yet - will go to pharmacy is interested in pneumovax   lipids are controlled with zocor with trig 127 and  Hdl 40 and LDL 106-- overall stable   dexa 2/10 - osteopenia ca and D D level is 66   partial hyst in past  no problems or symtpoms at all   goes to derm yearly   Allergies (verified): No Known Drug Allergies  Past History:  Past Medical History: Last updated: 11-08-08 Hyperlipidemia Osteopenia  GERD Allergic rhinitis  Past Surgical History: Last updated: 02/08/2009 GYN surgery- D % C (1987) Hysterectomy- partial, fibroids (1988) Tubal ligation (1987) Dexa- osteopenia (08/2000),   osteopenia (11/2002), stable (03/2005) Breast biopsy- neg (11/2002) Colonoscopy- ext hemorrhoids (04/2005) hospital- chest pain/ruled out for MI 4/09 nl nuclear stress test 4/09 dexa (2/10) mild osteopenia T score -1.0  Family History: Last updated: November 08, 2008 Father: deceased from MI age 72 Mother: deceased age 10- CVA Siblings:  P aunt breast  ca cholesterol elevation in family  Social History: Last updated: 12/12/2010 Marital Status: Married  (husb with a lot of health probs and CAD)  Children: 2 Former Smoker Alcohol use-no exercise- going to [employer]  employed -plans to retire at 53   Risk Factors: Smoking Status: quit (04/02/2008)  Social History: Marital Status: Married  (husb with a lot of health probs and CAD)  Children: 2 Former Smoker Alcohol use-no exercise- going to [employer]  employed -plans to retire at 51   Review of Systems General:  Denies fatigue and malaise. Eyes:  Denies blurring and eye irritation. CV:  Denies chest pain or discomfort, lightheadness, palpitations, and shortness of breath with exertion. Resp:  Denies cough, shortness of breath, and wheezing. GI:  Denies abdominal pain, change in bowel habits, indigestion, and nausea. GU:  Denies dysuria and urinary frequency. MS:  Complains of joint pain and stiffness. Derm:  Denies itching, lesion(s), poor wound healing, and rash. Neuro:  Denies numbness and tingling. Psych:  Denies anxiety and depression. Endo:  Denies cold intolerance, excessive thirst, excessive urination, and heat intolerance. Heme:  Denies abnormal bruising and bleeding.  Physical Exam  General:  Well-developed,well-nourished,in no acute distress; alert,appropriate and cooperative throughout examination Head:  normocephalic, atraumatic, and no abnormalities observed.  Eyes:  vision grossly intact, pupils equal, pupils round, and pupils reactive to light.  no conjunctival pallor, injection or icterus  Ears:  R ear normal and L ear normal.   Nose:  no nasal discharge.   Mouth:  pharynx pink and moist.   Neck:  supple with full rom and no masses or thyromegally, no JVD or carotid bruit  Chest Wall:  No deformities, masses, or tenderness noted. Breasts:  No mass, nodules, thickening, tenderness, bulging, retraction, inflamation, nipple discharge or skin changes noted.     Lungs:  Normal respiratory effort, chest expands symmetrically. Lungs are clear to auscultation, no crackles or wheezes. Heart:  Normal rate and regular rhythm. S1 and S2 normal without gallop, murmur, click, rub or other extra sounds. Abdomen:  Bowel sounds positive,abdomen soft and non-tender without masses, organomegaly or hernias noted. no renal bruits  Msk:  No deformity or scoliosis noted of thoracic or lumbar spine.  some joint changes in hands - distal as well as thenar wasting Pulses:  R and L carotid,radial,femoral,dorsalis pedis and posterior tibial pulses are full and equal bilaterally Extremities:  No clubbing, cyanosis, edema, or deformity noted with normal full range of motion of all joints.   Neurologic:  sensation intact to light touch, gait normal, and DTRs symmetrical and normal.   Skin:  many sks  no other skin changes  Cervical Nodes:  No lymphadenopathy noted Axillary Nodes:  No palpable lymphadenopathy Inguinal Nodes:  No significant adenopathy Psych:  normal affect, talkative and pleasant    Impression & Recommendations:  Problem # 1:  HEALTH MAINTENANCE EXAM (ICD-V70.0) Assessment Comment Only reviewed health habits including diet, exercise and skin cancer prevention reviewed health maintenance list and family history reviewed wellness labs in detail   Problem # 2:  GERD (ICD-530.81) Assessment: Improved  well controlled on ppi refilled that  disc diet Her updated medication list for this problem includes:    Omeprazole 20 Mg Cpdr (Omeprazole) .Marland Kitchen... 1 by mouth once daily  Orders: Prescription Created Electronically [phone])  Problem # 3:  HYPERLIPIDEMIA (ICD-272.4) Assessment: Unchanged  well controlled on statin and diet rev labs with pt rev low sat fat diet  ref med Her updated medication list for this problem includes:    Zocor 40 Mg Tabs (Simvastatin) .Marland Kitchen... 1 by mouth once daily  Labs Reviewed: SGOT: 22 (12/06/2010)   SGPT: 18  (12/06/2010)  Lipid Goals: Chol Goal: 200 (10/12/2009)   HDL Goal: 40 (10/12/2009)   LDL Goal: 130 (10/12/2009)   TG Goal: 150 (10/12/2009)  Prior 10 Yr Risk Heart Disease: 11 % (10/12/2009)   HDL:40.80 (12/06/2010), 37.00 (04/19/2010)  LDL:106 (12/06/2010), 104 (04/19/2010)  Chol:172 (12/06/2010), 175 (04/19/2010)  Trig:127.0 (12/06/2010), 171.0 (04/19/2010)  Orders: Prescription Created Electronically [phone])  Problem # 4:  OSTEOPENIA (ICD-733.90) Assessment: Unchanged pt on PPI and post menop good vit D level dexa after feb sched Her updated medication list for this problem includes:    Vitamin D 2000 Unit Tabs (Cholecalciferol) .Marland Kitchen... Take 2000 international units daily otc    Calcium Carbonate-vitamin D 600-400 Mg-unit Tabs (Calcium carbonate-vitamin d) ..... One tablet by mouth twice a day  Orders: Radiology Referral (Radiology)  Problem # 5:  ARTHRITIS, HANDS, BILATERAL (HYQ-657.84) Assessment: New oa and now some muscle wasting in hands - thenar area no carpal tunnel symptoms  Orders: Orthopedic Referral (Ortho)  Complete Medication List: 1)  Zocor 40 Mg Tabs (Simvastatin) .Marland Kitchen.. 1 by mouth once daily 2)  Multi-vitamin Tabs (Multiple vitamin) .Marland KitchenMarland KitchenMarland Kitchen  One by mouth daily 3)  Omeprazole 20 Mg Cpdr (Omeprazole) .Marland Kitchen.. 1 by mouth once daily 4)  Adult Aspirin Low Strength 81 Mg Tbdp (Aspirin) .... Take one by mouth daily 5)  Tylenol 325 Mg Tabs (Acetaminophen) .... Otc as directed. 6)  Goodys Body Pain 500-325 Mg Pack (Aspirin-acetaminophen) .... Otc as directed. 7)  Advil 200 Mg Tabs (Ibuprofen) .... Otc as directed. 8)  Vitamin D 2000 Unit Tabs (Cholecalciferol) .... Take 2000 international units daily otc 9)  Calcium Carbonate-vitamin D 600-400 Mg-unit Tabs (Calcium carbonate-vitamin d) .... One tablet by mouth twice a day  Other Orders: Pneumococcal Vaccine (40981) Admin 1st Vaccine (19147)  CHF Assessment/Plan:      The patient's current weight is 145.75 pounds.  Her  previous weight was 150.25 pounds.    Patient Instructions: 1)  it is not too late to get a flu shot -- get one at a pharmacy  2)  we will do referral for dexa at check out  3)  we will do hand doctor referral at check out  4)  pneuonia vaccine today 5)  if you are interested in a shingles vaccine in the future- please call your insurance co to see how coverage is and we can schedule that  6)  stay active and eat a healthy diet  Prescriptions: OMEPRAZOLE 20 MG  CPDR (OMEPRAZOLE) 1 by mouth once daily  #30 x 11   Entered and Authorized by:   Judith Part MD   Signed by:   Judith Part MD on 12/12/2010   Method used:   Electronically to        [facility]* (retail)       [address]       [place], [place]  [address]       Ph: [phone]       Fax: [phone]   RxID:   [id number] ZOCOR 40 MG TABS (SIMVASTATIN) 1 by mouth once daily  #30 x 11   Entered and Authorized by:   Judith Part MD   Signed by:   Judith Part MD on 12/12/2010   Method used:   Electronically to        [facility]* (retail)       [address]       [place], [place]  [address]       Ph: [phone]       Fax: [phone]   RxID:   [id number]    Orders Added: 1)  Orthopedic Referral [Ortho] 2)  Radiology Referral [Radiology] 3)  Pneumococcal Vaccine [90732] 4)  Admin 1st Vaccine [90471] 5)  Prescription Created Electronically [G8553] 6)  Est. Patient 65& > [99397] 7)  Est. Patient Level II [55732]   Immunizations Administered:  Pneumonia Vaccine:    Vaccine Type: Pneumovax    Site: left deltoid    Mfr: Merck    Dose: 0.5 ml    Route: IM    Given by: Lewanda Rife LPN    Exp. Date: 04/30/2012    Lot #: [id number]    VIS given: 11/26/09 version given December 12, 2010.   Immunizations Administered:  Pneumonia Vaccine:    Vaccine Type: Pneumovax    Site: left deltoid    Mfr: Merck    Dose: 0.5 ml    Route: IM    Given by: Lewanda Rife LPN    Exp. Date: 04/30/2012     Lot #: [id number]    VIS given: 11/26/09 version given December 12, 2010.  Current Allergies (reviewed today): No known allergies

## 2011-01-23 NOTE — Consult Note (Signed)
Summary: [facility]   Imported By: Lanelle Bal 01/08/2011 10:41:18  _____________________________________________________________________  External Attachment:    Type:   Image     Comment:   External Document

## 2011-01-28 ENCOUNTER — Encounter: Payer: Self-pay | Admitting: Family Medicine

## 2011-01-31 ENCOUNTER — Other Ambulatory Visit: Payer: Self-pay | Admitting: Family Medicine

## 2011-01-31 ENCOUNTER — Encounter: Payer: Self-pay | Admitting: Family Medicine

## 2011-01-31 ENCOUNTER — Other Ambulatory Visit: Payer: Self-pay

## 2011-01-31 ENCOUNTER — Ambulatory Visit (INDEPENDENT_AMBULATORY_CARE_PROVIDER_SITE_OTHER)
Admission: RE | Admit: 2011-01-31 | Discharge: 2011-01-31 | Disposition: A | Discharge status: Home / Self Care | Payer: Self-pay | Source: Ambulatory Visit | Attending: Family Medicine | Admitting: Family Medicine

## 2011-01-31 DIAGNOSIS — M899 Disorder of bone, unspecified: Secondary | ICD-10-CM

## 2011-01-31 DIAGNOSIS — M949 Disorder of cartilage, unspecified: Secondary | ICD-10-CM

## 2011-01-31 LAB — HM DEXA SCAN

## 2011-02-06 NOTE — Miscellaneous (Signed)
   Clinical Lists Changes  Orders: Added new Referral order of Radiology Referral (Radiology) - Signed  Appended Document:  please ignore and cancel this order Shirlee Limerick- wrong patient ! -- MT  Appended Document:  hey- wait a minute-- this is the right patient!-- sorry- don't cancel the order -- the flag sent me to this chart but had different name  go foward with referral  --Tara Sloan T

## 2011-02-06 NOTE — Miscellaneous (Addendum)
Summary: order for dexa    Clinical Lists Changes  Orders: Added new Test order of T-Bone Densitometry [phone]) - Signed Added new Test order of T-Lumbar Vertebral Assessment [phone]) - Signed  Appended Document: order for dexa   please let pt know that dexa is fairly stable showing mildl osteopenia  slt better in spine and slt worse in hip - overall stable  continue ca and d and exercise as tolerated  plan to re check this in about 2 years  Letter mailed. Delilah Shan CMA Duncan Dull)  February 14, 2011 2:46 PM  Clinical Lists Changes  Observations: Added new observation of BD LTFEMNECK: -1.2  (01/31/2011 19:49) Added new observation of BD L1-L4 T: -0.4  (01/31/2011 19:49) Added new observation of BONE DENSITY: osteopenia std dev (01/31/2011 19:49)       Preventive Care Screening  T-score L femur:    Date:  01/31/2011    Results:  -1.2   T-score L-Spine:    Date:  01/31/2011    Results:  -0.4   Bone Density:    Date:  01/31/2011    Results:  osteopenia std dev

## 2011-02-14 ENCOUNTER — Encounter (INDEPENDENT_AMBULATORY_CARE_PROVIDER_SITE_OTHER): Payer: Self-pay | Admitting: *Deleted

## 2011-02-18 NOTE — Letter (Signed)
Summary: Generic Letter  [place] at [facility]  [address] [place], [place] [address]   Phone: [phone]  Fax: [phone]    02/14/2011    [place] [address], [place]  [address]  [place]    Dear Ms. Bramhall,    Dexa scan is fairly stable showing mild osteopenia.  It is slightly better in the spine and slightly worse in the hip - overall stable.  Continue Calcium and Vitamin D and exercise as tolerated.  We will plan to re check this in about 2 years .     Sincerely,   Delilah Shan CMA (AAMA)for [place] A. Milinda Antis, M.D.  MAT:lsf

## 2011-05-06 NOTE — Discharge Summary (Signed)
NAMESANTORIA, CHASON             ACCOUNT NO.:  [id number]   MEDICAL RECORD NO.:  [id number]          PATIENT TYPE:  INP   LOCATION:  4705                         FACILITY:  [facility]   PHYSICIAN:  Valerie A. Felicity Coyer, MDDATE OF BIRTH:  03-07-1946   DATE OF ADMISSION:  04/02/2008  DATE OF DISCHARGE:  04/03/2008                               DISCHARGE SUMMARY   DISCHARGE DIAGNOSES:  1. Chest pain of unclear etiology.  2. Hypokalemia.  3. Gastroesophageal reflux disease.   HISTORY OF PRESENT ILLNESS:  Ms. Croke is a 65 year old white female  with history of hyperlipidemia and acid reflux, who presented to the  emergency room on day of admission with reports of midsternal and left-  sided chest burning.  The patient reports discomfort that woke her from  sleep at 5:30 in the morning on day of admission.  She denied any  associated symptoms with the pain including no nausea, vomiting,  diaphoresis, or shortness of breath.  She reports the pain is constant  and worse on inspiration.  The patient re-awoke that a.m. with continued  pain; at which time, she decided to come into the emergency room for  further evaluation and treatment.  She reports pain resolved quickly on  arrival to ER.  She did receive 324 of aspirin and potassium during  evaluation in the ER.  Due to the patient's positive risk factors for  coronary artery disease, the patient was admitted for further evaluation  and treatment.   PAST MEDICAL HISTORY:  1. Hyperlipidemia.  2. Osteopenia.  3. GERD.  4. Allergic rhinitis.  5. Hysterectomy.  6. Tubal ligation.  7. External hemorrhoids.   HOSPITAL COURSE:  Problem:  1. Chest pain.  The patient with family history of coronary artery      disease with father deceased at 34 with MI, according to the      patient.  The patient reports 2 brothers alive and well with no      known CAD.  The patient also with hyperlipidemia, therefore making      her high risk for coronary  artery disease.  Evaluation revealed      normal EKG with no signs of ischemia.  Cardiac enzymes were      negative x2.  The patient with no arrhythmias overnight on      telemetry.  D-dimer was less than 0.22, therefore highly unlikely      for PE.  The patient with no recurrent pain since in the emergency      room.  The patient is instructed to continue on aspirin 81 mg      daily.  Due to the patient's multiple risk factors for coronary      artery disease, she has been scheduled for an outpatient Myoview on      Friday, April 07, 2008, at 8:45 a.m.  The results of this test will      need to be followed up by her primary care physician at her      appointment the following Monday.  Copies of the results of this  test have been send to [place] at [facility].  2. Hypokalemia.  The patient status post repeat at time of admission,      this can be checked at her followup appointment with her primary      care physician.  3. GERD.  The patient placed on Prilosec 2 weeks ago by primary care      physician.  She reports relief of nausea and reflux-type symptoms.      Question, whether or not the patient's history of reflux is related      to chest pain.  The patient was instructed to continue her PPI at      the time of discharge.   MEDICATIONS:  At time of discharge.  1. Aspirin 81 mg p.o. daily.  2. Zocor 40 mg p.o. daily.  3. Prilosec 20 mg p.o. daily.  4. Multivitamin p.o. daily.   LABORATORY DATA:  Pertinent lab work during hospitalization, white cell  count 7.0, platelet count 175, hemoglobin 14.1, hematocrit 41.2.  Sodium  141, potassium 3.2, BUN 22, creatinine 1.2.  D-dimer less than 0.22.  Cardiac enzymes negative x2.  Chest x-ray with no acute findings,  however, did reveal chronic component of lung disease in addition to  osteopenia of the spine.   DISPOSITION:  The patient felt medically stable for discharge home as  her pain is highly unlikely cardiac related due  to negative workup and  physical exam.  The patient was instructed to follow up with her primary  care physician, Dr. Milinda Antis, on April 10, 2008, at 12:15 p.m.  In addition  this Friday, she is to have a Myoview stress test done by [facility] at 8:45 a.m.  The results of the patient's stress test will  need to be reviewed at that time of her appointment on April 10, 2008.  Results of a Myoview stress test have been requested to be sent to the  [place] at [facility], attention Dr. [name].      Cordelia Pen, NP      Raenette Rover. Felicity Coyer, MD  Electronically Signed    LE/MEDQ  D:  04/03/2008  T:  04/04/2008  Job:  [id number]   cc:   Marne A. Milinda Antis, MD

## 2011-07-02 ENCOUNTER — Encounter: Payer: Self-pay | Admitting: Family Medicine

## 2011-07-03 ENCOUNTER — Ambulatory Visit (INDEPENDENT_AMBULATORY_CARE_PROVIDER_SITE_OTHER): Payer: BC Managed Care – PPO | Admitting: Family Medicine

## 2011-07-03 ENCOUNTER — Encounter: Payer: Self-pay | Admitting: Family Medicine

## 2011-07-03 VITALS — BP 134/78 | HR 76 | Temp 98.3°F | Wt 146.8 lb

## 2011-07-03 DIAGNOSIS — J029 Acute pharyngitis, unspecified: Secondary | ICD-10-CM

## 2011-07-03 DIAGNOSIS — J020 Streptococcal pharyngitis: Secondary | ICD-10-CM

## 2011-07-03 LAB — POCT RAPID STREP A (OFFICE): Rapid Strep A Screen: POSITIVE — AB

## 2011-07-03 MED ORDER — AMOXICILLIN 875 MG PO TABS: 875.0000 mg | ORAL_TABLET | Freq: Two times a day (BID) | ORAL | Status: AC

## 2011-07-03 NOTE — Progress Notes (Signed)
Started last week with some intermittent chills.  She was fatigued ~July 4th.  She didn't have a ST but did notice some white spots on the R side. Some sinus drainage.  Getting ready to leave town.  No fevers, but she typically doesn't have fevers with an illness. She may have some days that are better than other.    Meds, vitals, and allergies reviewed.   ROS: See HPI.  Otherwise, noncontributory.  nad Ncat Tm wnl Nasal exam with mild erythema Op with cobblestoning and tonsil stones noted Neck with shotty LA rrr ctab Ext well perfused.   rst positive.

## 2011-07-03 NOTE — Patient Instructions (Signed)
Start the amoxil today.  Get some rest and drink plenty of fluids.  Take care.

## 2011-07-03 NOTE — Assessment & Plan Note (Signed)
Amoxil, supportive tx and f/u prn.  She agrees. Okay for outpatient f/u.

## 2011-07-25 ENCOUNTER — Other Ambulatory Visit: Payer: Self-pay | Admitting: Family Medicine

## 2011-07-25 DIAGNOSIS — Z1231 Encounter for screening mammogram for malignant neoplasm of breast: Secondary | ICD-10-CM

## 2011-08-14 ENCOUNTER — Ambulatory Visit: Payer: BC Managed Care – PPO

## 2011-08-18 ENCOUNTER — Ambulatory Visit
Admission: RE | Admit: 2011-08-18 | Discharge: 2011-08-18 | Disposition: A | Discharge status: Home / Self Care | Payer: BC Managed Care – PPO | Source: Ambulatory Visit | Attending: Family Medicine | Admitting: Family Medicine

## 2011-08-18 DIAGNOSIS — Z1231 Encounter for screening mammogram for malignant neoplasm of breast: Secondary | ICD-10-CM

## 2011-08-22 ENCOUNTER — Encounter: Payer: Self-pay | Admitting: *Deleted

## 2011-09-16 LAB — DIFFERENTIAL
Basophils Absolute: 0
Basophils Relative: 1
Eosinophils Absolute: 0.1
Eosinophils Relative: 1
Lymphocytes Relative: 33
Lymphs Abs: 2.3
Monocytes Absolute: 0.7
Monocytes Relative: 10
Neutro Abs: 3.9
Neutrophils Relative %: 56

## 2011-09-16 LAB — POCT CARDIAC MARKERS
CKMB, poc: <1 — ABNORMAL LOW
Myoglobin, poc: 40.8
Operator id: 294521
Troponin i, poc: <0.05

## 2011-09-16 LAB — CBC
HCT: 41.2
Hemoglobin: 14.1
MCHC: 34.3
MCV: 88.6
Platelets: 175
RBC: 4.66
RDW: 13.3
WBC: 7

## 2011-09-16 LAB — CK TOTAL AND CKMB (NOT AT ARMC)
CK, MB: 1.7
CK, MB: 1.7
Relative Index: 1.6
Relative Index: INVALID
Total CK: 107
Total CK: 96

## 2011-09-16 LAB — POCT I-STAT, CHEM 8
BUN: 22
Calcium, Ion: 1.06 — ABNORMAL LOW
Chloride: 108
Creatinine, Ser: 1.2
Glucose, Bld: 114 — ABNORMAL HIGH
HCT: 41
Hemoglobin: 13.9
Potassium: 3.2 — ABNORMAL LOW
Sodium: 141
TCO2: 21

## 2011-09-16 LAB — TROPONIN I
Troponin I: 0.01
Troponin I: 0.02

## 2011-09-16 LAB — D-DIMER, QUANTITATIVE (NOT AT ARMC): D-Dimer, Quant: <0.22

## 2011-10-23 HISTORY — PX: TONSILLECTOMY: SUR1361

## 2011-11-05 ENCOUNTER — Encounter (HOSPITAL_BASED_OUTPATIENT_CLINIC_OR_DEPARTMENT_OTHER): Payer: Self-pay | Admitting: *Deleted

## 2011-11-05 NOTE — Progress Notes (Signed)
Needs ekg am surg Still works full time

## 2011-11-06 NOTE — H&P (Signed)
PREOPERATIVE H&P  Chief Complaint: enlarged r tonsil  HPI: Tara Sloan is a 65 y.o. female who presents for evaluation of enlarged r tonsil. She has a history of strept. She continued continues to have an enlarged right tonsil compared to the left. This is firm to palpation. She is admitted at this time to have a right tonsillectomy.  Past Medical History  Diagnosis Date  . HLD (hyperlipidemia)   . Osteopenia   . GERD (gastroesophageal reflux disease)   . Allergic rhinitis   . External hemorrhoid   . Arthritis     osteopenia  . Osteopenia    Past Surgical History  Procedure Date  . Dilation and curettage of uterus 1987  . Partial hysterectomy 1988    fibroids  . Tubal ligation 1987  . Colonoscopy 5/06    Ext. hemms  . Breast biopsy 12/03    Negative   History   Social History  . Marital Status: Married    Spouse Name: N/A    Number of Children: 2  . Years of Education: N/A   Occupational History  . Employed    Social History Main Topics  . Smoking status: Former Smoker    Quit date: 11/04/1974  . Smokeless tobacco: None  . Alcohol Use: Yes     Rare  . Drug Use: No  . Sexually Active: None   Other Topics Concern  . None   Social History Narrative   Married (husband with a lot of health problems and CAD)2 childrenExercise-going to [employer]Employed-plans to retire at 40   Family History  Problem Relation Age of Onset  . Heart attack Father 56  . Stroke Mother 21  . Breast cancer Paternal Aunt   . Hyperlipidemia      Family history   No Known Allergies Prior to Admission medications   Medication Sig Start Date End Date Taking? Authorizing Provider  acetaminophen (TYLENOL) 325 MG tablet Take 650 mg by mouth every 6 (six) hours as needed.      Historical Provider, MD  aspirin 81 MG tablet Take 81 mg by mouth daily.      Historical Provider, MD  Aspirin-Acetaminophen (GOODY BODY PAIN) 500-325 MG PACK Take by mouth as directed.      Historical Provider,  MD  Calcium Carbonate-Vitamin D (CALCIUM 600/VITAMIN D) 600-400 MG-UNIT per tablet Take 1 tablet by mouth 2 (two) times daily.      Historical Provider, MD  Ergocalciferol (VITAMIN D2) 2000 UNITS TABS Take 1 tablet by mouth daily.      Historical Provider, MD  ibuprofen (ADVIL,MOTRIN) 200 MG tablet Take 200 mg by mouth as needed.      Historical Provider, MD  Multiple Vitamin (MULTIVITAMIN) tablet Take 1 tablet by mouth daily.      Historical Provider, MD  omeprazole (PRILOSEC) 20 MG capsule Take 20 mg by mouth daily.      Historical Provider, MD  simvastatin (ZOCOR) 40 MG tablet Take 40 mg by mouth at bedtime.      Historical Provider, MD     Positive ROS: Enlarged right tonsil  All other systems have been reviewed and were otherwise negative with the exception of those mentioned in the HPI and as above.  Physical Exam: There were no vitals filed for this visit.  General: Alert, no acute distress Oral: Enlarged right tonsil twice the size of left Nasal: Clear nasal passages Neck: No palpable adenopathy or thyroid nodules Ear: Ear canal is clear with normal appearing TMs  Cardiovascular: Regular rate and rhythm, no murmur.  Respiratory: Clear to auscultation Neurologic: Alert and oriented x 3   Assessment/Plan: Enlarged right tonsil Plan for Procedure(s): TONSILLECTOMY   NEWMAN, CHRISTOPHER E, MD 11/06/2011 4:58 PM

## 2011-11-07 ENCOUNTER — Encounter (HOSPITAL_BASED_OUTPATIENT_CLINIC_OR_DEPARTMENT_OTHER): Payer: Self-pay | Admitting: Anesthesiology

## 2011-11-07 ENCOUNTER — Other Ambulatory Visit: Payer: Self-pay | Admitting: Otolaryngology

## 2011-11-07 ENCOUNTER — Encounter (HOSPITAL_BASED_OUTPATIENT_CLINIC_OR_DEPARTMENT_OTHER)
Admission: RE | Disposition: A | Discharge status: Home / Self Care | Payer: Self-pay | Source: Ambulatory Visit | Attending: Otolaryngology

## 2011-11-07 ENCOUNTER — Ambulatory Visit (HOSPITAL_BASED_OUTPATIENT_CLINIC_OR_DEPARTMENT_OTHER)
Admission: RE | Admit: 2011-11-07 | Discharge: 2011-11-07 | Disposition: A | Discharge status: Home / Self Care | Payer: BC Managed Care – PPO | Source: Ambulatory Visit | Attending: Otolaryngology | Admitting: Otolaryngology

## 2011-11-07 ENCOUNTER — Ambulatory Visit (HOSPITAL_BASED_OUTPATIENT_CLINIC_OR_DEPARTMENT_OTHER): Payer: BC Managed Care – PPO | Admitting: Anesthesiology

## 2011-11-07 ENCOUNTER — Encounter (HOSPITAL_BASED_OUTPATIENT_CLINIC_OR_DEPARTMENT_OTHER): Payer: Self-pay | Admitting: *Deleted

## 2011-11-07 ENCOUNTER — Other Ambulatory Visit: Payer: Self-pay

## 2011-11-07 DIAGNOSIS — E785 Hyperlipidemia, unspecified: Secondary | ICD-10-CM | POA: Insufficient documentation

## 2011-11-07 DIAGNOSIS — J3501 Chronic tonsillitis: Secondary | ICD-10-CM | POA: Insufficient documentation

## 2011-11-07 DIAGNOSIS — K219 Gastro-esophageal reflux disease without esophagitis: Secondary | ICD-10-CM | POA: Insufficient documentation

## 2011-11-07 DIAGNOSIS — C8330 Diffuse large B-cell lymphoma, unspecified site: Secondary | ICD-10-CM

## 2011-11-07 HISTORY — PX: TONSILLECTOMY: SHX5217

## 2011-11-07 HISTORY — DX: Diffuse large B-cell lymphoma, unspecified site: C83.30

## 2011-11-07 HISTORY — DX: Unspecified osteoarthritis, unspecified site: M19.90

## 2011-11-07 LAB — POCT I-STAT, CHEM 8
BUN: 25 mg/dL — ABNORMAL HIGH (ref 6–23)
Calcium, Ion: 0.97 mmol/L — ABNORMAL LOW (ref 1.12–1.32)
Chloride: 110 mEq/L (ref 96–112)
Creatinine, Ser: 0.9 mg/dL (ref 0.50–1.10)
Glucose, Bld: 115 mg/dL — ABNORMAL HIGH (ref 70–99)
HCT: 39 % (ref 36.0–46.0)
Hemoglobin: 13.3 g/dL (ref 12.0–15.0)
Potassium: 3.9 mEq/L (ref 3.5–5.1)
Sodium: 140 mEq/L (ref 135–145)
TCO2: 23 mmol/L (ref 0–100)

## 2011-11-07 SURGERY — TONSILLECTOMY
Anesthesia: General

## 2011-11-07 MED ORDER — LORTAB 7.5-500 MG/15ML PO ELIX: 15.0000 mL | ORAL_SOLUTION | ORAL | Status: AC | PRN

## 2011-11-07 MED ORDER — CISATRACURIUM PEDS BOLUS VIA INFUSION (> 20 KG)
INTRAVENOUS | Status: DC | PRN
  Administered 2011-11-07: 8 mg via INTRAVENOUS

## 2011-11-07 MED ORDER — CEFAZOLIN SODIUM 1-5 GM-% IV SOLN
1.0000 g | Freq: Once | INTRAVENOUS | Status: AC
  Administered 2011-11-07: 1 g via INTRAVENOUS

## 2011-11-07 MED ORDER — NEOSTIGMINE METHYLSULFATE 1 MG/ML IJ SOLN
INTRAMUSCULAR | Status: DC | PRN
  Administered 2011-11-07: 3 mg via INTRAVENOUS

## 2011-11-07 MED ORDER — MIDAZOLAM HCL 5 MG/5ML IJ SOLN
INTRAMUSCULAR | Status: DC | PRN
  Administered 2011-11-07: 2 mg via INTRAVENOUS

## 2011-11-07 MED ORDER — AMOXIL 400 MG/5ML PO SUSR: 500.0000 mg | Freq: Two times a day (BID) | ORAL | Status: AC

## 2011-11-07 MED ORDER — FENTANYL CITRATE 0.05 MG/ML IJ SOLN
INTRAMUSCULAR | Status: DC | PRN
  Administered 2011-11-07 (×2): 100 ug via INTRAVENOUS

## 2011-11-07 MED ORDER — DEXAMETHASONE SODIUM PHOSPHATE 10 MG/ML IJ SOLN: 10.0000 mg | Freq: Once | INTRAMUSCULAR | Status: DC

## 2011-11-07 MED ORDER — SODIUM CHLORIDE 0.9 % IR SOLN
Status: DC | PRN
  Administered 2011-11-07: 100 mL

## 2011-11-07 MED ORDER — HYDROCODONE-ACETAMINOPHEN 7.5-500 MG/15ML PO SOLN
15.0000 mL | Freq: Once | ORAL | Status: AC
  Administered 2011-11-07: 15 mL via ORAL

## 2011-11-07 MED ORDER — ONDANSETRON HCL 4 MG/2ML IJ SOLN: 4.0000 mg | Freq: Once | INTRAMUSCULAR | Status: DC | PRN

## 2011-11-07 MED ORDER — DEXAMETHASONE SODIUM PHOSPHATE 4 MG/ML IJ SOLN
INTRAMUSCULAR | Status: DC | PRN
  Administered 2011-11-07: 10 mg via INTRAVENOUS

## 2011-11-07 MED ORDER — FENTANYL CITRATE 0.05 MG/ML IJ SOLN
100.0000 ug | INTRAMUSCULAR | Status: DC | PRN
  Administered 2011-11-07: 50 ug via INTRAVENOUS

## 2011-11-07 MED ORDER — LACTATED RINGERS IV SOLN
INTRAVENOUS | Status: DC
  Administered 2011-11-07 (×2): via INTRAVENOUS

## 2011-11-07 MED ORDER — PROPOFOL 10 MG/ML IV EMUL
INTRAVENOUS | Status: DC | PRN
  Administered 2011-11-07: 30 mg via INTRAVENOUS
  Administered 2011-11-07: 50 mg via INTRAVENOUS
  Administered 2011-11-07: 170 mg via INTRAVENOUS

## 2011-11-07 MED ORDER — GLYCOPYRROLATE 0.2 MG/ML IJ SOLN
INTRAMUSCULAR | Status: DC | PRN
  Administered 2011-11-07: .4 mg via INTRAVENOUS

## 2011-11-07 SURGICAL SUPPLY — 30 items
BANDAGE COBAN STERILE 2 (GAUZE/BANDAGES/DRESSINGS) IMPLANT
CANISTER SUCTION 1200CC (MISCELLANEOUS) ×2 IMPLANT
CATH ROBINSON RED A/P 12FR (CATHETERS) IMPLANT
CATH ROBINSON RED A/P 14FR (CATHETERS) ×2 IMPLANT
CLOTH BEACON ORANGE TIMEOUT ST (SAFETY) ×2 IMPLANT
COAGULATOR SUCT SWTCH 10FR 6 (ELECTROSURGICAL) IMPLANT
COVER MAYO STAND STRL (DRAPES) ×2 IMPLANT
ELECT COATED BLADE 2.86 ST (ELECTRODE) ×2 IMPLANT
ELECT REM PT RETURN 9FT ADLT (ELECTROSURGICAL) ×2
ELECT REM PT RETURN 9FT PED (ELECTROSURGICAL)
ELECTRODE REM PT RETRN 9FT PED (ELECTROSURGICAL) IMPLANT
ELECTRODE REM PT RTRN 9FT ADLT (ELECTROSURGICAL) ×1 IMPLANT
GAUZE SPONGE 4X4 12PLY STRL LF (GAUZE/BANDAGES/DRESSINGS) ×2 IMPLANT
GLOVE SKINSENSE NS SZ7.0 (GLOVE) ×1
GLOVE SKINSENSE STRL SZ7.0 (GLOVE) ×1 IMPLANT
GLOVE SS BIOGEL STRL SZ 7.5 (GLOVE) ×1 IMPLANT
GLOVE SUPERSENSE BIOGEL SZ 7.5 (GLOVE) ×1
GOWN PREVENTION PLUS XLARGE (GOWN DISPOSABLE) ×2 IMPLANT
GOWN PREVENTION PLUS XXLARGE (GOWN DISPOSABLE) IMPLANT
MARKER SKIN DUAL TIP RULER LAB (MISCELLANEOUS) IMPLANT
NS IRRIG 1000ML POUR BTL (IV SOLUTION) ×2 IMPLANT
PENCIL FOOT CONTROL (ELECTRODE) ×2 IMPLANT
SHEET MEDIUM DRAPE 40X70 STRL (DRAPES) ×2 IMPLANT
SOLUTION BUTLER CLEAR DIP (MISCELLANEOUS) ×2 IMPLANT
SPONGE TONSIL 1 RF SGL (DISPOSABLE) IMPLANT
SPONGE TONSIL 1.25 RF SGL STRG (GAUZE/BANDAGES/DRESSINGS) IMPLANT
SYR BULB 3OZ (MISCELLANEOUS) ×2 IMPLANT
TOWEL OR 17X24 6PK STRL BLUE (TOWEL DISPOSABLE) ×2 IMPLANT
TUBE CONNECTING 20X1/4 (TUBING) ×2 IMPLANT
WATER STERILE IRR 1000ML POUR (IV SOLUTION) IMPLANT

## 2011-11-07 NOTE — Anesthesia Procedure Notes (Signed)
Procedure Name: Intubation Date/Time: 11/07/2011 7:46 AM Performed by: Signa Kell Pre-anesthesia Checklist: Patient identified, Emergency Drugs available, Suction available and Patient being monitored Patient Re-evaluated:Patient Re-evaluated prior to inductionOxygen Delivery Method: Circle System Utilized Preoxygenation: Pre-oxygenation with 100% oxygen Intubation Type: IV induction Ventilation: Mask ventilation without difficulty Laryngoscope Size: Mac and 3 Grade View: Grade I Tube type: Oral Tube size: 7.0 mm Number of attempts: 1 Airway Equipment and Method: stylet Placement Confirmation: ETT inserted through vocal cords under direct vision,  positive ETCO2 and breath sounds checked- equal and bilateral Tube secured with: Tape Dental Injury: Teeth and Oropharynx as per pre-operative assessment

## 2011-11-07 NOTE — Brief Op Note (Signed)
11/07/2011  8:27 AM  PATIENT:  Salina April  65 y.o. female  PRE-OPERATIVE DIAGNOSIS:  chronic tonsillitis  POST-OPERATIVE DIAGNOSIS:  Chronic Tonsillitis- Enlarged Right Tonsil  PROCEDURE:  Procedure(s): TONSILLECTOMY  SURGEON:  Surgeon(s): Drema Halon, MD  PHYSICIAN ASSISTANT:   ASSISTANTS: none   ANESTHESIA:   general  EBL:  Total I/O In: 1000 [I.V.:1000] Out: -     DRAINS: none   LOCAL MEDICATIONS USED:  NONE  SPECIMEN:  Source of Specimen:  tonsils  DISPOSITION OF SPECIMEN:  PATHOLOGY  COUNTS:  YES  TOURNIQUET:  * No tourniquets in log *  DICTATION: .Other Dictation: Dictation Number [id number]  PLAN OF CARE: Discharge to home after PACU  PATIENT DISPOSITION:  PACU - hemodynamically stable.

## 2011-11-07 NOTE — Interval H&P Note (Signed)
History and Physical Interval Note:   11/07/2011   7:14 AM   Tara Sloan  has presented today for surgery, with the diagnosis of chronic tonsillitis  The various methods of treatment have been discussed with the patient and family. After consideration of risks, benefits and other options for treatment, the patient has consented to  Procedure(s): TONSILLECTOMY as a surgical intervention .  The patients' history has been reviewed, patient examined, no change in status, stable for surgery.  I have reviewed the patients' chart and labs.  Questions were answered to the patient's satisfaction.     Drema Halon  MD

## 2011-11-07 NOTE — Op Note (Signed)
NAME:  Tara Sloan, Tara Sloan                  ACCOUNT NO.:  MEDICAL RECORD NO.:  [id number]  LOCATION:                                 FACILITY:  PHYSICIAN:  Kristine Garbe. Ezzard Standing, M.D. DATE OF BIRTH:  DATE OF PROCEDURE:  11/07/2011 DATE OF DISCHARGE:                              OPERATIVE REPORT   PREOPERATIVE DIAGNOSIS:  History of tonsillitis with abnormally enlarged right tonsil.  POSTOPERATIVE DIAGNOSIS:  History of tonsillitis with abnormally enlarged right tonsil.  OPERATION PERFORMED:   Tonsillectomy.  SURGEON:  Kristine Garbe. Ezzard Standing, MD  ANESTHESIA:  General endotracheal.  COMPLICATIONS:  None.  BRIEF CLINICAL INDICATION:  Tara Sloan is a 65 year old female who has had history of sore throats and recent history of strep a couple months ago.  Following her last tonsil infection, she has had a persistently enlarged right tonsil that has been present now for 3 months.  It is little bit firm to palpation and there is concern as whether this asymmetric tonsillar size is secondary to infection versus possible neoplasm.  Because of this, she is taken to operating room this time for tonsillectomy.  DESCRIPTION OF PROCEDURE:  After adequate endotracheal anesthesia, the patient received 1 g Ancef and 10 mg of Decadron IV preoperatively.  A mouth gag was used to expose the oropharynx.  The right tonsil was approximately twice the size of the left tonsil.  Tonsils were resected from tonsillar fossa using the cautery.  Hemostasis was obtained with the cautery.  After obtaining adequate hemostasis, the tonsils were sent to pathology separately.  Oropharynx was irrigated with saline and the patient was awoken from anesthesia.  DISPOSITION:  Tara Sloan is discharged home later this morning on Lortab, elixir one to one half tablespoons q.4 h. p.r.n. pain and amoxicillin suspension 500 mg b.i.d. for 1 week.  We will follow up in my office in 10-14 days for recheck.    ______________________________ Kristine Garbe. Ezzard Standing, M.D.    CEN/MEDQ  D:  11/07/2011  T:  11/07/2011  Job:  [id number]  cc:   Marne A. Milinda Antis, MD

## 2011-11-07 NOTE — Transfer of Care (Signed)
Immediate Anesthesia Transfer of Care Note  Patient: Tara Sloan  Procedure(s) Performed:  TONSILLECTOMY  Patient Location: PACU  Anesthesia Type: General  Level of Consciousness: awake, sedated and patient cooperative  Airway & Oxygen Therapy: Patient Spontanous Breathing and Patient connected to face mask oxygen  Post-op Assessment: Report given to PACU RN and Post -op Vital signs reviewed and stable  Post vital signs: Reviewed and stable  Complications: No apparent anesthesia complications

## 2011-11-07 NOTE — Anesthesia Postprocedure Evaluation (Signed)
  Anesthesia Post-op Note  Patient: Tara Sloan  Procedure(s) Performed:  TONSILLECTOMY  Patient Location: PACU  Anesthesia Type: General  Level of Consciousness: awake, alert  and oriented  Airway and Oxygen Therapy: Patient Spontanous Breathing  Post-op Pain: mild  Post-op Assessment: Post-op Vital signs reviewed  Post-op Vital Signs: stable  Complications: No apparent anesthesia complications and cardiovascular complications

## 2011-11-07 NOTE — Anesthesia Preprocedure Evaluation (Addendum)
Anesthesia Evaluation  Patient identified by MRN, date of birth, ID band Patient awake    Reviewed: Allergy & Precautions, H&P , Patient's Chart, lab work & pertinent test results  Airway Mallampati: I      Dental  (+) Teeth Intact   Pulmonary          Cardiovascular Regular Normal    Neuro/Psych    GI/Hepatic   Endo/Other    Renal/GU      Musculoskeletal   Abdominal   Peds  Hematology   Anesthesia Other Findings   Reproductive/Obstetrics                          Anesthesia Physical Anesthesia Plan  ASA: II  Anesthesia Plan: General   Post-op Pain Management:    Induction: Intravenous  Airway Management Planned: Oral ETT  Additional Equipment:   Intra-op Plan:   Post-operative Plan: Extubation in OR  Informed Consent: I have reviewed the patients History and Physical, chart, labs and discussed the procedure including the risks, benefits and alternatives for the proposed anesthesia with the patient or authorized representative who has indicated his/her understanding and acceptance.   Dental advisory given  Plan Discussed with:   Anesthesia Plan Comments:         Anesthesia Quick Evaluation

## 2011-11-11 ENCOUNTER — Encounter (HOSPITAL_BASED_OUTPATIENT_CLINIC_OR_DEPARTMENT_OTHER): Payer: Self-pay | Admitting: Otolaryngology

## 2011-11-22 DIAGNOSIS — Z8572 Personal history of non-Hodgkin lymphomas: Secondary | ICD-10-CM | POA: Insufficient documentation

## 2011-11-24 ENCOUNTER — Ambulatory Visit (HOSPITAL_BASED_OUTPATIENT_CLINIC_OR_DEPARTMENT_OTHER): Payer: BC Managed Care – PPO | Admitting: Oncology

## 2011-11-24 ENCOUNTER — Telehealth: Payer: Self-pay | Admitting: Oncology

## 2011-11-24 ENCOUNTER — Ambulatory Visit (HOSPITAL_BASED_OUTPATIENT_CLINIC_OR_DEPARTMENT_OTHER): Payer: BC Managed Care – PPO

## 2011-11-24 ENCOUNTER — Ambulatory Visit: Payer: BC Managed Care – PPO

## 2011-11-24 ENCOUNTER — Encounter: Payer: Self-pay | Admitting: Oncology

## 2011-11-24 VITALS — BP 145/87 | HR 92 | Temp 98.0°F | Ht 62.5 in | Wt 141.4 lb

## 2011-11-24 DIAGNOSIS — C8330 Diffuse large B-cell lymphoma, unspecified site: Secondary | ICD-10-CM

## 2011-11-24 DIAGNOSIS — C8581 Other specified types of non-Hodgkin lymphoma, lymph nodes of head, face, and neck: Secondary | ICD-10-CM

## 2011-11-24 DIAGNOSIS — C8589 Other specified types of non-Hodgkin lymphoma, extranodal and solid organ sites: Secondary | ICD-10-CM

## 2011-11-24 LAB — CBC WITH DIFFERENTIAL/PLATELET
BASO%: 0.3 % (ref 0.0–2.0)
Basophils Absolute: 0 10*3/uL (ref 0.0–0.1)
EOS%: 0.2 % (ref 0.0–7.0)
Eosinophils Absolute: 0 10*3/uL (ref 0.0–0.5)
HCT: 36.4 % (ref 34.8–46.6)
HGB: 12.4 g/dL (ref 11.6–15.9)
LYMPH%: 21.3 % (ref 14.0–49.7)
MCH: 30.4 pg (ref 25.1–34.0)
MCHC: 34.1 g/dL (ref 31.5–36.0)
MCV: 89.1 fL (ref 79.5–101.0)
MONO#: 0.6 10*3/uL (ref 0.1–0.9)
MONO%: 8.1 % (ref 0.0–14.0)
NEUT#: 5.6 10*3/uL (ref 1.5–6.5)
NEUT%: 70.1 % (ref 38.4–76.8)
Platelets: 268 10*3/uL (ref 145–400)
RBC: 4.09 10*6/uL (ref 3.70–5.45)
RDW: 13.9 % (ref 11.2–14.5)
WBC: 8 10*3/uL (ref 3.9–10.3)
lymph#: 1.7 10*3/uL (ref 0.9–3.3)

## 2011-11-24 LAB — COMPREHENSIVE METABOLIC PANEL
ALT: 18 U/L (ref 0–35)
AST: 21 U/L (ref 0–37)
Albumin: 3.8 g/dL (ref 3.5–5.2)
Alkaline Phosphatase: 64 U/L (ref 39–117)
BUN: 21 mg/dL (ref 6–23)
CO2: 24 mEq/L (ref 19–32)
Calcium: 9.8 mg/dL (ref 8.4–10.5)
Chloride: 106 mEq/L (ref 96–112)
Creatinine, Ser: 0.88 mg/dL (ref 0.50–1.10)
Glucose, Bld: 124 mg/dL — ABNORMAL HIGH (ref 70–99)
Potassium: 3.7 mEq/L (ref 3.5–5.3)
Sodium: 141 mEq/L (ref 135–145)
Total Bilirubin: 0.4 mg/dL (ref 0.3–1.2)
Total Protein: 7.1 g/dL (ref 6.0–8.3)

## 2011-11-24 LAB — LACTATE DEHYDROGENASE: LDH: 185 U/L (ref 94–250)

## 2011-11-24 NOTE — Telephone Encounter (Signed)
Added new pt for today @ 3 pm. Per tiffany in HIM pt aware.

## 2011-11-24 NOTE — Progress Notes (Signed)
[facility] - INITIAL CONSULATION   HPI: Tara Sloan is a 65 yo Caucasian woman with no significant history.  She was in usual state of health and to July of this year. She then developed fatigue, chills, not feeling well sensation. She was given a course of amoxicillin with improvement of her vague symptoms. However she noticed that she developed progressive persistent right tonsillar mass. Despite 2 courses of antibiotic the tonsillar mass did not resolve. She was referred to Dr. Christopher Newman who performed bilateral tonsillectomy. Pathology case number [id number] was notable for right tonsillar large B-cell lymphoma and the left tonsil showed only lymphoid hyperplasia.   Tara Sloan presented to the clinic for the first time with her husband. She has not noticed any pain in her bilateral tonsillectomy site. She denies any palpable node swelling. 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   Patient denies headache, visual changes, confusion, drenching night sweats, palpable lymph node swelling, mucositis, odynophagia, dysphagia, nausea vomiting, jaundice, chest pain, palpitation, shortness of breath, dyspnea on exertion, productive cough, gum bleeding, epistaxis, hematemesis, hemoptysis, abdominal pain, abdominal swelling, early satiety, melena, hematochezia, hematuria, skin rash, spontaneous bleeding, joint swelling, joint pain, heat or cold intolerance, bowel bladder incontinence, back pain, focal motor weakness, paresthesia, depression, suicidal or homocidal ideation, feeling hopelessness.    Past Medical History  Diagnosis Date  . HLD (hyperlipidemia)   . Osteopenia   . GERD (gastroesophageal reflux disease)   . Allergic rhinitis   . External hemorrhoid   . Arthritis     osteopenia  . Osteopenia   . 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 Dilation and curettage of uterus  1987  . Partial hysterectomy 1988    fibroids  . Tubal ligation 1987  . Colonoscopy 5/06    Ext. hemms  . Breast biopsy 12/03    Negative  . Tonsillectomy 11/07/2011    Procedure: TONSILLECTOMY;  Surgeon: Christopher E Newman, MD;  Location: [facility];  Service: ENT;  Laterality: N/A;  :  Current outpatient prescriptions:acetaminophen (TYLENOL) 325 MG tablet, Take 650 mg by mouth every 6 (six) hours as needed.  , Disp: , Rfl: ;  aspirin 81 MG tablet, Take 81 mg by mouth daily.  , Disp: , Rfl: ;  Aspirin-Acetaminophen (GOODY BODY PAIN) 500-325 MG PACK, Take by mouth as directed.  , Disp: , Rfl:  Calcium Carbonate-Vitamin D (CALCIUM 600/VITAMIN D) 600-400 MG-UNIT per tablet, Take 1 tablet by mouth 2 (two) times daily.  , Disp: , Rfl: ;  Ergocalciferol (VITAMIN D2) 2000 UNITS TABS, Take 1 tablet by mouth daily.  , Disp: , Rfl: ;  ibuprofen (ADVIL,MOTRIN) 200 MG tablet, Take 200 mg by mouth as needed.  , Disp: , Rfl: ;  Multiple Vitamin (MULTIVITAMIN) tablet, Take 1 tablet by mouth daily.  , Disp: , Rfl:  omeprazole (PRILOSEC) 20 MG capsule, Take 20 mg by mouth daily.  , Disp: , Rfl: ;  simvastatin (ZOCOR) 40 MG tablet, Take 40 mg by mouth at bedtime.  , Disp: , Rfl: :    :  No Known Allergies:  Family History  Problem Relation Age of Onset  . Heart attack Father 55  . Stroke Mother 79  . Breast cancer Paternal Aunt   . Hyperlipidemia      Family history  :  History   Social History  .   Marital Status: Married    Spouse Name: N/A    Number of Children: 2  . Years of Education: N/A   Occupational History  . Employed     [employer], Office manager   Social History Main Topics  . Smoking status: Former Smoker -- 5 years    Quit date: 11/04/1974  . Smokeless tobacco: Never Used  . Alcohol Use: Yes     Rare  . Drug Use: No  . Sexually Active: Yes    Birth Control/ Protection: None   Other Topics Concern  . Not on file   Social History Narrative    Married (husband with a lot of health problems and CAD)2 childrenExercise-going to [employer]Employ[employer]-plans to retire at 65    Pertinent items are noted in HPI.  EXAM:   General:  well-nourished in no acute distress.  Eyes:  no scleral icterus.  ENT:  There were no oropharyngeal lesions.  Neck was without thyromegaly.  Lymphatics:  Negative cervical, supraclavicular or axillary adenopathy.  Respiratory: lungs were clear bilaterally without wheezing or crackles.  Cardiovascular:  Regular rate and rhythm, S1/S2, without murmur, rub or gallop.  There was no pedal edema.  GI:  abdomen was soft, flat, nontender, nondistended, without organomegaly.  Muscoloskeletal:  no spinal tenderness of palpation of vertebral spine.  Skin exam was without echymosis, petichae.  Neuro exam was nonfocal.  Patient was able to get on and off exam table without assistance.  Gait was normal.  Patient was alerted and oriented.  Attention was good.   Language was appropriate.  Mood was normal without depression.  Speech was not pressured.  Thought content was not tangential.    LABS:   Basename 11/24/11 1522  WBC 8.0  HGB 12.4  HCT 36.4  PLT 268    Basename 11/24/11 1522  NA 141  K 3.7  CL 106  CO2 24  GLUCOSE 124*  BUN 21  CREATININE 0.88  CALCIUM 9.8    Assessment and Plan:   Newly diagnosed large B cell, NHL of right tonsil; s/p excisional biopsy.   I discussed with Tara Sloan and husband that this is a systemic disease. Normally a lymph node is biopsied and then patients undergo systemic chemotherapy for this diagnosis. Without systemic chemotherapy this disease has high chance of systemic recurrence. Even though she may not have any residual lymphoma as the only organ involved originally was the right tonsil; however, I do not feel comfortable with observation alone.  Since when the disease recurs with only observation, it may become more than to stage I, in which case the chance of cure is much lower.    The patient would like to request a second opinion. Her niece, Dr. Sally Smith, is a radiation oncologist [place],  [place]. I wait her direction as to what she would like me to send the pathology for secondary review.  Staging: I recommended patient undergoing PET scan to ensure there is no disease involvement elsewhere beside the right tonsillar area. I arranged for this for next to 3 weeks.  I recommended patient undergoing diagnostic bone marrow biopsy. The chance of involvement in the bone marrow space is low in her case however it is important to be determined definitively given the fact that the staging information would dictate the number of cycle of chemotherapy.  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 The recommended plan of treatment in that case is about 3 cycles   of R-CHOP chemotherapy followed by consolidation therapy versus 6 cycles chemotherapy +/- ration therapy.  If she has minimal disease on the staging PET scan then I may recommend every 3 cycles followed by consolidative radiation therapy to the right tonsilar bed.  If she has stage III or stage IV disease or high risk features despite stage I or 2, I may consider 6 cycles chemotherapy.  I briefly discussed with the patient and her husband the chemotherapy regimen R-CHOP which includes Rituxan, Cytoxan, Adriamycin, vincristine, prednisone.  This chemotherapy regimen has side effects which include but not limited to fatigue, alopecia, mucositis, nausea vomiting, diarrhea, cytopenia, is a bleeding, infection, constipation, neuropathy, congestive heart failure.  In preparation for treatment I asked her to think about where she would like to go for second opinion to review pathology and the treatment plan as solitary tonsilar DLBCL is not a common presentation. She would like to go ahead with a bone marrow biopsy within the next few weeks. I referred her to 2-D echocardiogram to ascertain  her ejection fraction at baseline.  I referred her to interventional radiology for placement of Port-A-Cath for chemotherapy access. I arranged for her to attend chemo class with the next 2-3 weeks. I will see her myself in about 3-4 weeks to go over staging plan and finalize treatment option as there is no emergency at this time as she is completely assymptomatic. 

## 2011-11-25 ENCOUNTER — Telehealth: Payer: Self-pay | Admitting: *Deleted

## 2011-11-25 ENCOUNTER — Other Ambulatory Visit: Payer: Self-pay | Admitting: Oncology

## 2011-11-25 DIAGNOSIS — C8590 Non-Hodgkin lymphoma, unspecified, unspecified site: Secondary | ICD-10-CM

## 2011-11-25 LAB — HEPATITIS B CORE ANTIBODY, TOTAL: Hep B Core Total Ab: NEGATIVE

## 2011-11-25 LAB — HEPATITIS C ANTIBODY: HCV Ab: NEGATIVE

## 2011-11-25 LAB — HEPATITIS B SURFACE ANTIGEN: Hepatitis B Surface Ag: NEGATIVE

## 2011-11-25 LAB — HEPATITIS B SURFACE ANTIBODY,QUALITATIVE

## 2011-11-25 NOTE — Telephone Encounter (Signed)
VM from [place] at [facility].  States she contacted pt re PAC placement ordered w/i next 2 weeks.  Pt stated she wants to wait until after her PET scan and BMBx to do PAC, but plans on having it done prior to starting chemo at beginning of next year.   Inetta Fermo says the order for [facility] will have to be put back in the computer once pt schedules it again.   Note to Dr. Gaylyn Rong.

## 2011-11-26 ENCOUNTER — Telehealth: Payer: Self-pay | Admitting: Oncology

## 2011-11-26 NOTE — Telephone Encounter (Signed)
Pt. Appt. To see Dr. Tressia Danas @ [facility] is 12/04/11 @ 1:30. Medical records faxed, slides and scans will be fedex'ed. Pt is aware.

## 2011-11-27 ENCOUNTER — Telehealth: Payer: Self-pay | Admitting: *Deleted

## 2011-11-27 NOTE — Telephone Encounter (Signed)
Pt's daughter called to clarify pt's schedule.  Pt has appt to see Dr. Fabian November at [facility] but needs to have PET and Bone Marrow BX done first. Reviewed appts w/ her and she will r/s appt at [facility] until after these are completed.

## 2011-11-28 ENCOUNTER — Encounter (HOSPITAL_COMMUNITY): Payer: Self-pay | Admitting: Pharmacy Technician

## 2011-11-28 NOTE — Telephone Encounter (Signed)
Called pt to r/s BMBx from 12/09/11 to next week per Dr. Gaylyn Rong.  Pt can come in on Wed 12/03/11 at 8 am.  Called Flow Cytology and confirmed new schedule w/ Montez Morita.  Called Infusion and left VM for Marcelino Duster to r/s as above with lab after Bx appointment.     (Clarification to previous note;  It was pt's Niece, Dr. Janann Colonel (not Daughter) who called regarding the [facility] appointment)

## 2011-11-28 NOTE — Telephone Encounter (Signed)
Please call pt, Tara Sloan, and Tara Sloan reschedule her BM bx with me from late Dec 2012 to this coming week at 8am (LAB AFTER bone marrow not before since I have pts at 830 everyday).  Any day will work with me except for Tue 12/11.    Please also move up her PET for within 1 wk.    These need to be done soon for her 2nd opinion visit to [facility].    Thanks.

## 2011-12-01 ENCOUNTER — Encounter (HOSPITAL_COMMUNITY)
Admission: RE | Admit: 2011-12-01 | Discharge: 2011-12-01 | Disposition: A | Discharge status: Home / Self Care | Payer: BC Managed Care – PPO | Source: Ambulatory Visit | Attending: Oncology | Admitting: Oncology

## 2011-12-01 ENCOUNTER — Other Ambulatory Visit: Payer: Self-pay | Admitting: Physician Assistant

## 2011-12-01 ENCOUNTER — Telehealth: Payer: Self-pay | Admitting: Family Medicine

## 2011-12-01 DIAGNOSIS — E785 Hyperlipidemia, unspecified: Secondary | ICD-10-CM

## 2011-12-01 DIAGNOSIS — R7303 Prediabetes: Secondary | ICD-10-CM | POA: Insufficient documentation

## 2011-12-01 DIAGNOSIS — C8330 Diffuse large B-cell lymphoma, unspecified site: Secondary | ICD-10-CM

## 2011-12-01 DIAGNOSIS — C8589 Other specified types of non-Hodgkin lymphoma, extranodal and solid organ sites: Secondary | ICD-10-CM | POA: Insufficient documentation

## 2011-12-01 DIAGNOSIS — M949 Disorder of cartilage, unspecified: Secondary | ICD-10-CM

## 2011-12-01 DIAGNOSIS — K219 Gastro-esophageal reflux disease without esophagitis: Secondary | ICD-10-CM

## 2011-12-01 DIAGNOSIS — R739 Hyperglycemia, unspecified: Secondary | ICD-10-CM

## 2011-12-01 DIAGNOSIS — M899 Disorder of bone, unspecified: Secondary | ICD-10-CM

## 2011-12-01 DIAGNOSIS — Z13220 Encounter for screening for lipoid disorders: Secondary | ICD-10-CM | POA: Insufficient documentation

## 2011-12-01 DIAGNOSIS — Z131 Encounter for screening for diabetes mellitus: Secondary | ICD-10-CM

## 2011-12-01 MED ORDER — FLUDEOXYGLUCOSE F - 18 (FDG) INJECTION
19.2000 | Freq: Once | INTRAVENOUS | Status: AC | PRN
  Administered 2011-12-01: 19.2 via INTRAVENOUS

## 2011-12-01 NOTE — Telephone Encounter (Signed)
Message copied by Judy Pimple on Mon Dec 01, 2011  9:10 PM ------      Message from: Alvina Chou      Created: Tue Nov 25, 2011 11:51 AM      Regarding: orders for 12-02-11       Patient is scheduled for CPX labs, please order future labs, Thanks , Camelia Eng

## 2011-12-02 ENCOUNTER — Ambulatory Visit (HOSPITAL_COMMUNITY)
Admission: RE | Admit: 2011-12-02 | Discharge: 2011-12-02 | Disposition: A | Discharge status: Home / Self Care | Payer: BC Managed Care – PPO | Source: Ambulatory Visit | Attending: Oncology | Admitting: Oncology

## 2011-12-02 ENCOUNTER — Other Ambulatory Visit: Payer: BC Managed Care – PPO

## 2011-12-02 ENCOUNTER — Other Ambulatory Visit: Payer: Self-pay | Admitting: Oncology

## 2011-12-02 VITALS — BP 112/84 | HR 74 | Temp 97.7°F | Resp 13 | Ht 63.0 in | Wt 141.0 lb

## 2011-12-02 DIAGNOSIS — Z87891 Personal history of nicotine dependence: Secondary | ICD-10-CM | POA: Insufficient documentation

## 2011-12-02 DIAGNOSIS — K219 Gastro-esophageal reflux disease without esophagitis: Secondary | ICD-10-CM | POA: Insufficient documentation

## 2011-12-02 DIAGNOSIS — C8330 Diffuse large B-cell lymphoma, unspecified site: Secondary | ICD-10-CM

## 2011-12-02 DIAGNOSIS — M949 Disorder of cartilage, unspecified: Secondary | ICD-10-CM | POA: Insufficient documentation

## 2011-12-02 DIAGNOSIS — C8589 Other specified types of non-Hodgkin lymphoma, extranodal and solid organ sites: Secondary | ICD-10-CM | POA: Insufficient documentation

## 2011-12-02 DIAGNOSIS — E785 Hyperlipidemia, unspecified: Secondary | ICD-10-CM | POA: Insufficient documentation

## 2011-12-02 DIAGNOSIS — M899 Disorder of bone, unspecified: Secondary | ICD-10-CM | POA: Insufficient documentation

## 2011-12-02 LAB — GLUCOSE, CAPILLARY: Glucose-Capillary: 111 mg/dL — ABNORMAL HIGH (ref 70–99)

## 2011-12-02 MED ORDER — CEFAZOLIN SODIUM 1-5 GM-% IV SOLN
1.0000 g | Freq: Once | INTRAVENOUS | Status: AC
  Administered 2011-12-02: 1 g via INTRAVENOUS
  Filled 2011-12-02: qty 50

## 2011-12-02 MED ORDER — LIDOCAINE-EPINEPHRINE 2 %-1:100000 IJ SOLN
INTRAMUSCULAR | Status: AC
  Filled 2011-12-02: qty 1

## 2011-12-02 MED ORDER — HEPARIN SOD (PORK) LOCK FLUSH 100 UNIT/ML IV SOLN
500.0000 [IU] | Freq: Once | INTRAVENOUS | Status: AC
  Administered 2011-12-02: 500 [IU] via INTRAVENOUS

## 2011-12-02 MED ORDER — FENTANYL CITRATE 0.05 MG/ML IJ SOLN
INTRAMUSCULAR | Status: AC | PRN
  Administered 2011-12-02 (×2): 100 ug via INTRAVENOUS

## 2011-12-02 MED ORDER — LIDOCAINE HCL 1 % IJ SOLN
INTRAMUSCULAR | Status: AC
  Filled 2011-12-02: qty 20

## 2011-12-02 MED ORDER — MIDAZOLAM HCL 5 MG/5ML IJ SOLN
INTRAMUSCULAR | Status: AC | PRN
  Administered 2011-12-02: 2 mg via INTRAVENOUS

## 2011-12-02 MED ORDER — SODIUM CHLORIDE 0.9 % IV SOLN: INTRAVENOUS | Status: DC

## 2011-12-02 NOTE — Interval H&P Note (Cosign Needed)
History and Physical Interval Note: Patient's history reviewed.  No new symptoms, denies any changes in ROS.  No recent CP, SOB or other factors contraindicated for procedure today.  12/02/2011 12:49 PM  Tara Sloan  has presented today for surgery, with the diagnosis of * B-cell lymphoma in need of chemotherapy. *  The various methods of treatment have been discussed with the patient and family. After consideration of risks, benefits and other options for treatment, the patient has consented to port a cath placement under moderate sedation as a surgical intervention.  The patients' history has been reviewed, patient examined, no change in status, stable for surgery.  I have reviewed the patients' chart and labs.  Questions were answered to the patient's satisfaction.  Written consent obtained.    Emersen Carroll D, PA-C

## 2011-12-02 NOTE — Procedures (Signed)
Placement of portacath.  Tip in SVC.  Ready to use.

## 2011-12-02 NOTE — H&P (View-Only) (Signed)
[facility] - INITIAL CONSULATION   HPI: Tara Sloan is a 65 yo Caucasian woman with no significant history.  She was in usual state of health and to July of this year. She then developed fatigue, chills, not feeling well sensation. She was given a course of amoxicillin with improvement of her vague symptoms. However she noticed that she developed progressive persistent right tonsillar mass. Despite 2 courses of antibiotic the tonsillar mass did not resolve. She was referred to Dr. Dillard Cannon who performed bilateral tonsillectomy. Pathology case number [id number] was notable for right tonsillar large B-cell lymphoma and the left tonsil showed only lymphoid hyperplasia.   Tara Sloan presented to the clinic for the first time with her husband. She has not noticed any pain in her bilateral tonsillectomy site. She denies any palpable node swelling. 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   Patient denies headache, visual changes, confusion, drenching night sweats, palpable lymph node swelling, mucositis, odynophagia, dysphagia, nausea vomiting, jaundice, chest pain, palpitation, shortness of breath, dyspnea on exertion, productive cough, gum bleeding, epistaxis, hematemesis, hemoptysis, abdominal pain, abdominal swelling, early satiety, melena, hematochezia, hematuria, skin rash, spontaneous bleeding, joint swelling, joint pain, heat or cold intolerance, bowel bladder incontinence, back pain, focal motor weakness, paresthesia, depression, suicidal or homocidal ideation, feeling hopelessness.    Past Medical History  Diagnosis Date  . HLD (hyperlipidemia)   . Osteopenia   . GERD (gastroesophageal reflux disease)   . Allergic rhinitis   . External hemorrhoid   . Arthritis     osteopenia  . Osteopenia   . 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 Dilation and curettage of uterus  1987  . Partial hysterectomy 1988    fibroids  . Tubal ligation 1987  . Colonoscopy 5/06    Ext. hemms  . Breast biopsy 12/03    Negative  . Tonsillectomy 11/07/2011    Procedure: TONSILLECTOMY;  Surgeon: Drema Halon, MD;  Location: [facility];  Service: ENT;  Laterality: N/A;  :  Current outpatient prescriptions:acetaminophen (TYLENOL) 325 MG tablet, Take 650 mg by mouth every 6 (six) hours as needed.  , Disp: , Rfl: ;  aspirin 81 MG tablet, Take 81 mg by mouth daily.  , Disp: , Rfl: ;  Aspirin-Acetaminophen (GOODY BODY PAIN) 500-325 MG PACK, Take by mouth as directed.  , Disp: , Rfl:  Calcium Carbonate-Vitamin D (CALCIUM 600/VITAMIN D) 600-400 MG-UNIT per tablet, Take 1 tablet by mouth 2 (two) times daily.  , Disp: , Rfl: ;  Ergocalciferol (VITAMIN D2) 2000 UNITS TABS, Take 1 tablet by mouth daily.  , Disp: , Rfl: ;  ibuprofen (ADVIL,MOTRIN) 200 MG tablet, Take 200 mg by mouth as needed.  , Disp: , Rfl: ;  Multiple Vitamin (MULTIVITAMIN) tablet, Take 1 tablet by mouth daily.  , Disp: , Rfl:  omeprazole (PRILOSEC) 20 MG capsule, Take 20 mg by mouth daily.  , Disp: , Rfl: ;  simvastatin (ZOCOR) 40 MG tablet, Take 40 mg by mouth at bedtime.  , Disp: , Rfl: :    :  No Known Allergies:  Family History  Problem Relation Age of Onset  . Heart attack Father 68  . Stroke Mother 26  . Breast cancer Paternal Aunt   . Hyperlipidemia      Family history  :  History   Social History  .  Marital Status: Married    Spouse Name: N/A    Number of Children: 2  . Years of Education: N/A   Occupational History  . Employed     [employer], Print production planner   Social History Main Topics  . Smoking status: Former Smoker -- 5 years    Quit date: 11/04/1974  . Smokeless tobacco: Never Used  . Alcohol Use: Yes     Rare  . Drug Use: No  . Sexually Active: Yes    Birth Control/ Protection: None   Other Topics Concern  . Not on file   Social History Narrative    Married (husband with a lot of health problems and CAD)2 childrenExercise-going to CurvesEmployed-plans to retire at 57    Pertinent items are noted in HPI.  EXAM:   General:  well-nourished in no acute distress.  Eyes:  no scleral icterus.  ENT:  There were no oropharyngeal lesions.  Neck was without thyromegaly.  Lymphatics:  Negative cervical, supraclavicular or axillary adenopathy.  Respiratory: lungs were clear bilaterally without wheezing or crackles.  Cardiovascular:  Regular rate and rhythm, S1/S2, without murmur, rub or gallop.  There was no pedal edema.  GI:  abdomen was soft, flat, nontender, nondistended, without organomegaly.  Muscoloskeletal:  no spinal tenderness of palpation of vertebral spine.  Skin exam was without echymosis, petichae.  Neuro exam was nonfocal.  Patient was able to get on and off exam table without assistance.  Gait was normal.  Patient was alerted and oriented.  Attention was good.   Language was appropriate.  Mood was normal without depression.  Speech was not pressured.  Thought content was not tangential.    LABS:   Basename 11/24/11 1522  WBC 8.0  HGB 12.4  HCT 36.4  PLT 268    Basename 11/24/11 1522  NA 141  K 3.7  CL 106  CO2 24  GLUCOSE 124*  BUN 21  CREATININE 0.88  CALCIUM 9.8    Assessment and Plan:   Newly diagnosed large B cell, NHL of right tonsil; s/p excisional biopsy.   I discussed with Tara Sloan and husband that this is a systemic disease. Normally a lymph node is biopsied and then patients undergo systemic chemotherapy for this diagnosis. Without systemic chemotherapy this disease has high chance of systemic recurrence. Even though she may not have any residual lymphoma as the only organ involved originally was the right tonsil; however, I do not feel comfortable with observation alone.  Since when the disease recurs with only observation, it may become more than to stage I, in which case the chance of cure is much lower.    The patient would like to request a second opinion. Her niece, Dr. Janann Colonel, is a radiation oncologist [place],  [place]. I wait her direction as to what she would like me to send the pathology for secondary review.  Staging: I recommended patient undergoing PET scan to ensure there is no disease involvement elsewhere beside the right tonsillar area. I arranged for this for next to 3 weeks.  I recommended patient undergoing diagnostic bone marrow biopsy. The chance of involvement in the bone marrow space is low in her case however it is important to be determined definitively given the fact that the staging information would dictate the number of cycle of chemotherapy.  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 The recommended plan of treatment in that case is about 3 cycles  of R-CHOP chemotherapy followed by consolidation therapy versus 6 cycles chemotherapy +/- ration therapy.  If she has minimal disease on the staging PET scan then I may recommend every 3 cycles followed by consolidative radiation therapy to the right tonsilar bed.  If she has stage III or stage IV disease or high risk features despite stage I or 2, I may consider 6 cycles chemotherapy.  I briefly discussed with the patient and her husband the chemotherapy regimen R-CHOP which includes Rituxan, Cytoxan, Adriamycin, vincristine, prednisone.  This chemotherapy regimen has side effects which include but not limited to fatigue, alopecia, mucositis, nausea vomiting, diarrhea, cytopenia, is a bleeding, infection, constipation, neuropathy, congestive heart failure.  In preparation for treatment I asked her to think about where she would like to go for second opinion to review pathology and the treatment plan as solitary tonsilar DLBCL is not a common presentation. She would like to go ahead with a bone marrow biopsy within the next few weeks. I referred her to 2-D echocardiogram to ascertain  her ejection fraction at baseline.  I referred her to interventional radiology for placement of Port-A-Cath for chemotherapy access. I arranged for her to attend chemo class with the next 2-3 weeks. I will see her myself in about 3-4 weeks to go over staging plan and finalize treatment option as there is no emergency at this time as she is completely assymptomatic.

## 2011-12-02 NOTE — ED Notes (Signed)
Patient denies pain and is resting comfortably.  

## 2011-12-03 ENCOUNTER — Other Ambulatory Visit (HOSPITAL_BASED_OUTPATIENT_CLINIC_OR_DEPARTMENT_OTHER): Payer: BC Managed Care – PPO | Admitting: Lab

## 2011-12-03 ENCOUNTER — Other Ambulatory Visit: Payer: Self-pay | Admitting: Oncology

## 2011-12-03 ENCOUNTER — Ambulatory Visit (HOSPITAL_BASED_OUTPATIENT_CLINIC_OR_DEPARTMENT_OTHER): Payer: BC Managed Care – PPO | Admitting: Oncology

## 2011-12-03 ENCOUNTER — Ambulatory Visit (HOSPITAL_COMMUNITY)
Admission: RE | Admit: 2011-12-03 | Discharge: 2011-12-03 | Disposition: A | Discharge status: Home / Self Care | Payer: BC Managed Care – PPO | Source: Ambulatory Visit | Attending: Oncology | Admitting: Oncology

## 2011-12-03 ENCOUNTER — Ambulatory Visit: Payer: BC Managed Care – PPO

## 2011-12-03 ENCOUNTER — Other Ambulatory Visit (HOSPITAL_COMMUNITY)
Admission: RE | Admit: 2011-12-03 | Discharge: 2011-12-03 | Disposition: A | Discharge status: Home / Self Care | Payer: BC Managed Care – PPO | Source: Ambulatory Visit | Attending: Oncology | Admitting: Oncology

## 2011-12-03 VITALS — BP 167/93 | HR 94 | Temp 98.1°F

## 2011-12-03 DIAGNOSIS — C8589 Other specified types of non-Hodgkin lymphoma, extranodal and solid organ sites: Secondary | ICD-10-CM | POA: Insufficient documentation

## 2011-12-03 DIAGNOSIS — J309 Allergic rhinitis, unspecified: Secondary | ICD-10-CM | POA: Insufficient documentation

## 2011-12-03 DIAGNOSIS — E785 Hyperlipidemia, unspecified: Secondary | ICD-10-CM | POA: Insufficient documentation

## 2011-12-03 DIAGNOSIS — M899 Disorder of bone, unspecified: Secondary | ICD-10-CM | POA: Insufficient documentation

## 2011-12-03 DIAGNOSIS — C8330 Diffuse large B-cell lymphoma, unspecified site: Secondary | ICD-10-CM

## 2011-12-03 DIAGNOSIS — Z23 Encounter for immunization: Secondary | ICD-10-CM

## 2011-12-03 DIAGNOSIS — I369 Nonrheumatic tricuspid valve disorder, unspecified: Secondary | ICD-10-CM

## 2011-12-03 DIAGNOSIS — Z01818 Encounter for other preprocedural examination: Secondary | ICD-10-CM | POA: Insufficient documentation

## 2011-12-03 DIAGNOSIS — K219 Gastro-esophageal reflux disease without esophagitis: Secondary | ICD-10-CM | POA: Insufficient documentation

## 2011-12-03 DIAGNOSIS — M129 Arthropathy, unspecified: Secondary | ICD-10-CM | POA: Insufficient documentation

## 2011-12-03 LAB — CBC WITH DIFFERENTIAL/PLATELET
BASO%: 0.6 % (ref 0.0–2.0)
Basophils Absolute: 0 10*3/uL (ref 0.0–0.1)
EOS%: 0.8 % (ref 0.0–7.0)
Eosinophils Absolute: 0 10*3/uL (ref 0.0–0.5)
HCT: 36.9 % (ref 34.8–46.6)
HGB: 12.6 g/dL (ref 11.6–15.9)
LYMPH%: 19.2 % (ref 14.0–49.7)
MCH: 30.6 pg (ref 25.1–34.0)
MCHC: 34.2 g/dL (ref 31.5–36.0)
MCV: 89.6 fL (ref 79.5–101.0)
MONO#: 0.5 10*3/uL (ref 0.1–0.9)
MONO%: 8.8 % (ref 0.0–14.0)
NEUT#: 4.3 10*3/uL (ref 1.5–6.5)
NEUT%: 70.6 % (ref 38.4–76.8)
Platelets: 187 10*3/uL (ref 145–400)
RBC: 4.12 10*6/uL (ref 3.70–5.45)
RDW: 14.1 % (ref 11.2–14.5)
WBC: 6 10*3/uL (ref 3.9–10.3)
lymph#: 1.2 10*3/uL (ref 0.9–3.3)

## 2011-12-03 MED ORDER — INFLUENZA VIRUS VACC SPLIT PF IM SUSP
0.5000 mL | Freq: Once | INTRAMUSCULAR | Status: AC
  Administered 2011-12-03: 0.5 mL via INTRAMUSCULAR
  Filled 2011-12-03: qty 0.5

## 2011-12-03 NOTE — Progress Notes (Signed)
Bone marrow biopsy and aspiration.  INDICATION:  Procedure: After obtained from consent, Tara Sloan was placed in the prone position. Time out was performed verifying correct patient and procedure. The skin overlying the left posterior crest was prepped with Betadine and draped in the usual sterile fashion. The skin and periosteum were infiltrated with 30 mL of 2% lidocaine as she was still uncomfortable with the first 2 attempts of 10mL each. A small puncture wound was made with #11 scalpel blade.  Bone marrow aspirate was obtained on the first pass of the aspiration needle.  One separate core biopsy was obtained through the same incision after the 3rd attempt.   The aspirate was sent for routine histology, flow cytometry, and cytogenetics.  Core biopsy was sent for routine histology.   Tara Sloan tolerated procedure well with minimal  blood loss and without immediate complication.   A sterile dressing was applied.   Jethro Bolus M.D. 12/03/2011

## 2011-12-03 NOTE — Progress Notes (Signed)
  Echocardiogram 2D Echocardiogram has been performed.  Tara Sloan [facility] 12/03/2011, 10:34 AM

## 2011-12-03 NOTE — Patient Instructions (Signed)
L iliac pressure dressing clean dry and intact. Pt instructed to keep pressure dressing in place for 24 hours. Apply pressure if site bleeds. She verbalized understanding.

## 2011-12-05 ENCOUNTER — Telehealth: Payer: Self-pay | Admitting: Internal Medicine

## 2011-12-05 DIAGNOSIS — C8330 Diffuse large B-cell lymphoma, unspecified site: Secondary | ICD-10-CM

## 2011-12-05 MED ORDER — PROMETHAZINE HCL 25 MG PO TABS: 25.0000 mg | ORAL_TABLET | Freq: Four times a day (QID) | ORAL | Status: DC | PRN

## 2011-12-05 NOTE — Telephone Encounter (Signed)
Happy to call in the phenergan  They did not mention pharmacy so Px written for call in   Please tell Dr Gaylyn Rong about the nausea and vomiting - in case there is something else he needs to recommend or check out (since I'm not an oncologist) Watch out for sedation with this and hope it helps

## 2011-12-05 NOTE — Telephone Encounter (Signed)
Left message on VM with results, advised that rx was sent to pharmacy and to call if any problems.

## 2011-12-05 NOTE — Telephone Encounter (Signed)
Patient's son called and stated that she was Dx was Large B-cells non-hospkins lymphoma two weeks ago by by her Oncolgoist Dr. Jethro Bolus and took both tonsils out becsause of tumor.  Now she is in stage 1 and she is nausea and throwing up and he wanted to know if we could call her in phenergan or Zoloft.  No diarrhea or no treatment has been started.  She is going for a second opinion at [facility].   Robbie [phone]

## 2011-12-09 ENCOUNTER — Other Ambulatory Visit: Payer: BC Managed Care – PPO | Admitting: Lab

## 2011-12-09 ENCOUNTER — Ambulatory Visit: Payer: BC Managed Care – PPO | Admitting: Oncology

## 2011-12-09 ENCOUNTER — Encounter: Payer: BC Managed Care – PPO | Admitting: Family Medicine

## 2011-12-11 NOTE — Progress Notes (Signed)
Received office notes from Dr. Ollen Gross.

## 2011-12-17 ENCOUNTER — Ambulatory Visit: Payer: BC Managed Care – PPO | Admitting: Oncology

## 2011-12-19 ENCOUNTER — Ambulatory Visit (HOSPITAL_BASED_OUTPATIENT_CLINIC_OR_DEPARTMENT_OTHER): Payer: BC Managed Care – PPO | Admitting: Oncology

## 2011-12-19 VITALS — BP 136/85 | HR 99 | Temp 97.1°F | Ht 63.0 in | Wt 142.5 lb

## 2011-12-19 DIAGNOSIS — C8330 Diffuse large B-cell lymphoma, unspecified site: Secondary | ICD-10-CM

## 2011-12-19 DIAGNOSIS — C8589 Other specified types of non-Hodgkin lymphoma, extranodal and solid organ sites: Secondary | ICD-10-CM

## 2011-12-19 MED ORDER — LIDOCAINE-PRILOCAINE 2.5-2.5 % EX CREA: TOPICAL_CREAM | CUTANEOUS | Status: AC | PRN

## 2011-12-19 MED ORDER — PROCHLORPERAZINE MALEATE 10 MG PO TABS: 10.0000 mg | ORAL_TABLET | Freq: Four times a day (QID) | ORAL | Status: DC | PRN

## 2011-12-19 MED ORDER — PREDNISONE 10 MG PO TABS: 70.0000 mg | ORAL_TABLET | Freq: Every day | ORAL | Status: AC

## 2011-12-19 MED ORDER — ONDANSETRON HCL 8 MG PO TABS: ORAL_TABLET | ORAL | Status: DC

## 2011-12-19 NOTE — Progress Notes (Signed)
[facility] OFFICE PROGRESS NOTE  Cc:  [name], MD, MD  DIAGNOSIS: Stage I Diffuse Large B-cell Lymphoma; IPSS of 1 given age; good prognosis.   CURRENT THERAPY:Due to start R-CHOP x 3 cycles followed by consolidation radiation on 12/30/2011.   INTERVAL HISTORY: Tara Sloan 65 y.o. female returns for regular follow up to go over her work up.  She visited with Dr. Beatrice Lecher at [facility] who concurred with the treatment plan.  She denies mouth sore, mucositis, odynophagia, dysphagia, nausea/vomiting, node swelling, fatigue, abdomina pain, weight loss, bleeding symptoms.   MEDICAL HISTORY: Past Medical History  Diagnosis Date  . HLD (hyperlipidemia)   . Osteopenia   . GERD (gastroesophageal reflux disease)   . Allergic rhinitis   . External hemorrhoid   . Arthritis     osteopenia  . Osteopenia   . 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 Dilation and curettage of uterus 1987  . Partial hysterectomy 1988    fibroids  . Tubal ligation 1987  . Colonoscopy 5/06    Ext. hemms  . Breast biopsy 12/03    Negative  . Tonsillectomy 11/07/2011    Procedure: TONSILLECTOMY;  Surgeon: Drema Halon, MD;  Location: [facility];  Service: ENT;  Laterality: N/A;    MEDICATIONS: Current Outpatient Prescriptions  Medication Sig Dispense Refill  . acetaminophen (TYLENOL) 325 MG tablet Take 650 mg by mouth every 6 (six) hours as needed. PAIN       . Aspirin-Acetaminophen (GOODY BODY PAIN) 500-325 MG PACK Take 0.5 packets by mouth every 4 (four) hours as needed. PAIN       . Calcium Carbonate-Vitamin D (CALCIUM 600/VITAMIN D) 600-400 MG-UNIT per tablet Take 1 tablet by mouth 2 (two) times daily.        . Ergocalciferol (VITAMIN D2) 2000 UNITS TABS Take 1 tablet by mouth daily.        . Multiple Vitamin (MULTIVITAMIN) tablet Take 1 tablet by mouth daily.        .  naproxen sodium (ANAPROX) 220 MG tablet Take 220 mg by mouth 2 (two) times daily as needed. PAIN        . omeprazole (PRILOSEC) 20 MG capsule Take 20 mg by mouth daily as needed. HEART BURN       . promethazine (PHENERGAN) 25 MG tablet       . simvastatin (ZOCOR) 40 MG tablet Take 40 mg by mouth at bedtime.        . lidocaine-prilocaine (EMLA) cream Apply topically as needed.  30 g  0  . ondansetron (ZOFRAN) 8 MG tablet Take 1 tablet two times a day starting the day after chemo for 3 days. Then take 1 tablet two times a day as needed for nausea or vomiting.   30 tablet  1  . predniSONE (DELTASONE) 10 MG tablet Take 7 tablets (70 mg total) by mouth daily.  35 tablet  2  . prochlorperazine (COMPAZINE) 10 MG tablet Take 1 tablet (10 mg total) by mouth every 6 (six) hours as needed (Nausea or vomiting).  30 tablet  6    ALLERGIES:   has no known allergies.  REVIEW OF SYSTEMS:  The rest of the 14-point review of system was negative.   Filed Vitals:   12/19/11 0902  BP: 136/85  Pulse: 99  Temp: 97.1 F (36.2 C)  Wt Readings from Last 3 Encounters:  12/19/11 142 lb 8 oz (64.638 kg)  12/02/11 141 lb (63.957 kg)  11/24/11 141 lb 6.4 oz (64.139 kg)   ECOG Performance status: 0  PHYSICAL EXAMINATION:   General:  well-nourished in no acute distress.  Eyes:  no scleral icterus.  ENT:  There were no oropharyngeal lesions.  Neck was without thyromegaly.  Lymphatics:  Negative cervical, supraclavicular or axillary adenopathy.  Respiratory: lungs were clear bilaterally without wheezing or crackles.  Cardiovascular:  Regular rate and rhythm, S1/S2, without murmur, rub or gallop.  There was no pedal edema.  GI:  abdomen was soft, flat, nontender, nondistended, without organomegaly.  Muscoloskeletal:  no spinal tenderness of palpation of vertebral spine.  Skin exam was without echymosis, petichae.  Neuro exam was nonfocal.  Patient was able to get on and off exam table without assistance.  Gait was  normal.  Patient was alerted and oriented.  Attention was good.   Language was appropriate.  Mood was normal without depression.  Speech was not pressured.  Thought content was not tangential.      LABORATORY/RADIOLOGY DATA:  Lab Results  Component Value Date   WBC 6.0 12/03/2011   HGB 12.6 12/03/2011   HCT 36.9 12/03/2011   PLT 187 12/03/2011   GLUCOSE 124* 11/24/2011   CHOL 172 12/06/2010   TRIG 127.0 12/06/2010   HDL 40.80 12/06/2010   LDLCALC 106* 12/06/2010   ALT 18 11/24/2011   AST 21 11/24/2011   NA 141 11/24/2011   K 3.7 11/24/2011   CL 106 11/24/2011   CREATININE 0.88 11/24/2011   BUN 21 11/24/2011   CO2 24 11/24/2011   TSH 1.87 12/06/2010   IMAGING:  I personally reviewed her PET scan myself.  There was uptake at the right oropharynx but nowhere else.   Nm Pet Image Initial (pi) Skull Base To Thigh  12/01/2011  *RADIOLOGY REPORT*  Clinical Data: Initial treatment strategy for lymphoma.  Large B- cell lymphoma of the right tonsil.  The  NUCLEAR MEDICINE PET SKULL BASE TO THIGH  Fasting Blood Glucose:  111  Technique:  19.2 mCi F-18 FDG was injected intravenously.   CT data was obtained and used for attenuation correction and anatomic localization only.  (This was not acquired as a diagnostic CT examination.) Additional exam technical data entered  on technologist worksheet.  Comparison: None the  Findings:  Neck: There is asymmetric metabolic activity within the posterior right oropharynx with SUV max = 6.7 (image 24). There is activity on the left additionally which is slightly less intense and a smaller region.  No hypermetabolic cervical lymph nodes or submental lymph nodes.   Salivary glands are normal.  The orbits are normal.  Chest: No hypermetabolic mediastinal or hilar lymph nodes.  No supraclavicular nodes.  Review of lung parenchyma demonstrates no suspicious nodules.  Abdomen / Pelvis:No abnormal hypermetabolic activity within the solid organs.  No evidence of abdominal or  pelvic hypermetabolic nodes.  Spleen appears normal.  Skeleton:No focal hypermetabolic activity to suggest skeletal metastasis.  IMPRESSION:  1.  Asymmetric hypermetabolic activity within the posterior right oropharynx consistent with the biopsy proven tonsillar lymphoma. 2.  No evidence of local nodal metastasis in the neck. 3.  No evidence of hypermetabolic nodes within the chest, abdomen, or pelvis.  Normal spleen.  Original Report Authenticated By: Genevive Bi, M.D.    ASSESSMENT AND PLAN:   Newly diagnosed large B cell, NHL of right tonsil; s/p excisional biopsy; IPSS  1; good risk.  Her PET scan did not show disease elsewhere.  Bone marrow biopsy showed rare lymphoid aggregates; however, not enough to be positive.  Thus, she is stage I.  Her chance of cure is up to 80-90%.   We gain went over in detail R-CHOP given once every 3 weeks.  Day 1 of each cycle is IV Rituxan, Cytoxan, Adriamycin, Vincristine.  She takes Prednisone 70mg  PO daily from days 1-5.  She returns to clinic on day 2 for Neulasta to decrease the risk of neutropenic fever.  We again went over side effects of this chemo regimen which include but not limited to fatigue, alopecia, mucositis, nausea/vomiting, cytopenia, infection, bleeding, neuropathy, hemorrhagic cystitis, skin rash, rare chance of congestive heart failure, secondary leukemia/MDS.  Prednisone can cause fluid retention, insomnia, feeling wired.  I strongly advised her to contact us for any concerning symptoms, especially fever to more than 100.63F.   She expressed informed understanding of the risk and benefit and wished to proceed.   In preparation for chemo; she had already had an echo with normal EF.  She had already had portacath placement.  I gave her prescription today for Compazine/Zofran prn nausea/vomiting; EMLA cream, and Prednisone.  I referred her to chemo class on 12/24/11.    She tentatively has chemo starting on 12/30/11 and Neulasta on 12/31/11.  I'll  see her on 01/21/12 before the 2nd cycle of chemo that day.     The length of time of the face-to-face encounter was 30 minutes. More than 50% of time was spent counseling and coordination of care.

## 2011-12-24 ENCOUNTER — Encounter: Payer: Self-pay | Admitting: *Deleted

## 2011-12-24 ENCOUNTER — Telehealth: Payer: Self-pay | Admitting: Oncology

## 2011-12-24 ENCOUNTER — Other Ambulatory Visit: Payer: BC Managed Care – PPO

## 2011-12-24 NOTE — Telephone Encounter (Signed)
Pt came in today to check on inj appts for 1/10 and 1/31. Added inj appts and lb/HH for 1/30 as ordered epr 12/28 electronic pof.

## 2011-12-30 ENCOUNTER — Other Ambulatory Visit: Payer: Self-pay | Admitting: Certified Registered Nurse Anesthetist

## 2011-12-30 ENCOUNTER — Other Ambulatory Visit: Payer: Medicare Other | Admitting: Lab

## 2011-12-30 ENCOUNTER — Ambulatory Visit (HOSPITAL_BASED_OUTPATIENT_CLINIC_OR_DEPARTMENT_OTHER): Payer: Medicare Other

## 2011-12-30 ENCOUNTER — Other Ambulatory Visit: Payer: Self-pay | Admitting: Oncology

## 2011-12-30 VITALS — BP 144/89 | HR 85 | Temp 97.2°F

## 2011-12-30 DIAGNOSIS — C8330 Diffuse large B-cell lymphoma, unspecified site: Secondary | ICD-10-CM

## 2011-12-30 DIAGNOSIS — C8589 Other specified types of non-Hodgkin lymphoma, extranodal and solid organ sites: Secondary | ICD-10-CM

## 2011-12-30 LAB — COMPREHENSIVE METABOLIC PANEL
ALT: 20 U/L (ref 0–35)
AST: 23 U/L (ref 0–37)
Albumin: 4.4 g/dL (ref 3.5–5.2)
Alkaline Phosphatase: 77 U/L (ref 39–117)
BUN: 20 mg/dL (ref 6–23)
CO2: 22 mEq/L (ref 19–32)
Calcium: 9.6 mg/dL (ref 8.4–10.5)
Chloride: 107 mEq/L (ref 96–112)
Creatinine, Ser: 0.88 mg/dL (ref 0.50–1.10)
Glucose, Bld: 102 mg/dL — ABNORMAL HIGH (ref 70–99)
Potassium: 4.2 mEq/L (ref 3.5–5.3)
Sodium: 141 mEq/L (ref 135–145)
Total Bilirubin: 0.3 mg/dL (ref 0.3–1.2)
Total Protein: 6.9 g/dL (ref 6.0–8.3)

## 2011-12-30 LAB — CBC WITH DIFFERENTIAL/PLATELET
BASO%: 0.8 % (ref 0.0–2.0)
Basophils Absolute: 0.1 10*3/uL (ref 0.0–0.1)
EOS%: 1.5 % (ref 0.0–7.0)
Eosinophils Absolute: 0.1 10*3/uL (ref 0.0–0.5)
HCT: 41 % (ref 34.8–46.6)
HGB: 13.6 g/dL (ref 11.6–15.9)
LYMPH%: 23.6 % (ref 14.0–49.7)
MCH: 29.4 pg (ref 25.1–34.0)
MCHC: 33.2 g/dL (ref 31.5–36.0)
MCV: 88.7 fL (ref 79.5–101.0)
MONO#: 0.6 10*3/uL (ref 0.1–0.9)
MONO%: 7.6 % (ref 0.0–14.0)
NEUT#: 4.9 10*3/uL (ref 1.5–6.5)
NEUT%: 66.5 % (ref 38.4–76.8)
Platelets: 172 10*3/uL (ref 145–400)
RBC: 4.62 10*6/uL (ref 3.70–5.45)
RDW: 14.1 % (ref 11.2–14.5)
WBC: 7.3 10*3/uL (ref 3.9–10.3)
lymph#: 1.7 10*3/uL (ref 0.9–3.3)
nRBC: 0 % (ref 0–0)

## 2011-12-30 MED ORDER — SODIUM CHLORIDE 0.9 % IV SOLN
Freq: Once | INTRAVENOUS | Status: AC
  Administered 2011-12-30: 10:00:00 via INTRAVENOUS

## 2011-12-30 MED ORDER — ACETAMINOPHEN 325 MG PO TABS
650.0000 mg | ORAL_TABLET | Freq: Once | ORAL | Status: AC
  Administered 2011-12-30: 650 mg via ORAL

## 2011-12-30 MED ORDER — DOXORUBICIN HCL CHEMO IV INJECTION 2 MG/ML
50.0000 mg/m2 | Freq: Once | INTRAVENOUS | Status: AC
  Administered 2011-12-30: 84 mg via INTRAVENOUS
  Filled 2011-12-30: qty 42

## 2011-12-30 MED ORDER — DEXAMETHASONE SODIUM PHOSPHATE 4 MG/ML IJ SOLN
20.0000 mg | Freq: Once | INTRAMUSCULAR | Status: AC
  Administered 2011-12-30: 20 mg via INTRAVENOUS

## 2011-12-30 MED ORDER — VINCRISTINE SULFATE CHEMO INJECTION 1 MG/ML
2.0000 mg | Freq: Once | INTRAVENOUS | Status: AC
  Administered 2011-12-30: 2 mg via INTRAVENOUS
  Filled 2011-12-30: qty 2

## 2011-12-30 MED ORDER — SODIUM CHLORIDE 0.9 % IJ SOLN
10.0000 mL | INTRAMUSCULAR | Status: DC | PRN
  Administered 2011-12-30: 10 mL
  Filled 2011-12-30: qty 10

## 2011-12-30 MED ORDER — ONDANSETRON 16 MG/50ML IVPB (CHCC)
16.0000 mg | Freq: Once | INTRAVENOUS | Status: AC
  Administered 2011-12-30: 16 mg via INTRAVENOUS

## 2011-12-30 MED ORDER — FAMOTIDINE IN NACL 20-0.9 MG/50ML-% IV SOLN
20.0000 mg | Freq: Once | INTRAVENOUS | Status: AC
  Administered 2011-12-30: 20 mg via INTRAVENOUS

## 2011-12-30 MED ORDER — DIPHENHYDRAMINE HCL 25 MG PO CAPS
50.0000 mg | ORAL_CAPSULE | Freq: Once | ORAL | Status: AC
  Administered 2011-12-30: 50 mg via ORAL

## 2011-12-30 MED ORDER — DIPHENHYDRAMINE HCL 25 MG PO CAPS
25.0000 mg | ORAL_CAPSULE | Freq: Once | ORAL | Status: AC
  Administered 2011-12-30: 25 mg via ORAL

## 2011-12-30 MED ORDER — SODIUM CHLORIDE 0.9 % IV SOLN
750.0000 mg/m2 | Freq: Once | INTRAVENOUS | Status: AC
  Administered 2011-12-30: 1260 mg via INTRAVENOUS
  Filled 2011-12-30: qty 63

## 2011-12-30 MED ORDER — SODIUM CHLORIDE 0.9 % IV SOLN
375.0000 mg/m2 | Freq: Once | INTRAVENOUS | Status: AC
  Administered 2011-12-30: 600 mg via INTRAVENOUS
  Filled 2011-12-30: qty 60

## 2011-12-30 MED ORDER — HEPARIN SOD (PORK) LOCK FLUSH 100 UNIT/ML IV SOLN
500.0000 [IU] | Freq: Once | INTRAVENOUS | Status: AC | PRN
  Administered 2011-12-30: 500 [IU]
  Filled 2011-12-30: qty 5

## 2011-12-30 NOTE — Progress Notes (Signed)
At 1255, VSS,denies distress, RITUXAN infusion increased to flow at a rate of 79 mls per hour for a volume of 39 mls.

## 2011-12-30 NOTE — Progress Notes (Signed)
At 1700, VSS,denies distress,  Infusion rate  Of RITUXAN increased to flow at 158 mls per hour for 79 mls.

## 2011-12-30 NOTE — Progress Notes (Signed)
At 1325, VSS,denies distress,  Infusion of RITUXAN increased to flow at 105 mls per hour for a volume of 53 mls.

## 2011-12-30 NOTE — Progress Notes (Signed)
At 1345, pt c/o itching, small rash noted on neck,  No difficulty breathing,  No chest pain or chills. Infusion stopped.  Dr. Clelia Croft

## 2011-12-30 NOTE — Progress Notes (Signed)
At 1225, VSS,denies distress, RITUXAN infusion increased  To flow at 53 mls per hour for a volume of 26 mls.

## 2011-12-30 NOTE — Progress Notes (Signed)
At 1630, VSS,denies distress,  RITUXAN infusion increased to flow at 13 131 mls per hour for 66 mls.

## 2011-12-30 NOTE — Progress Notes (Signed)
At 1530, Pt denies distress,  Infusion   Increased at 79 mls  Per hour for 39 mls.

## 2011-12-30 NOTE — Progress Notes (Signed)
Pt infusion completed at 1715, VSS,denies distress,

## 2011-12-30 NOTE — Progress Notes (Signed)
At 1600, VSS,denies distress, RITUXAN infusion increased to  105 mls per hour for 53 mls.

## 2011-12-30 NOTE — Progress Notes (Signed)
At  1155, RITUXAN infusion started at 26 mls per hour for a volume of 13 mls. Pt instructed  To inform nurse at once if having difficulty breathing, chest pain, or sudden chills.

## 2011-12-30 NOTE — Progress Notes (Signed)
At 1500, RITUXAN restarted at 53 mls per hour for 26 mls. Pt instructed to inform nurse at once if having, difficulty breathing, chest pain, or sudden chills.

## 2011-12-31 ENCOUNTER — Other Ambulatory Visit: Payer: BC Managed Care – PPO | Admitting: Lab

## 2011-12-31 ENCOUNTER — Ambulatory Visit: Payer: BC Managed Care – PPO

## 2011-12-31 ENCOUNTER — Telehealth: Payer: Self-pay | Admitting: *Deleted

## 2011-12-31 ENCOUNTER — Ambulatory Visit (HOSPITAL_BASED_OUTPATIENT_CLINIC_OR_DEPARTMENT_OTHER): Payer: Medicare Other

## 2011-12-31 VITALS — BP 127/78 | HR 97 | Temp 99.4°F

## 2011-12-31 DIAGNOSIS — C8589 Other specified types of non-Hodgkin lymphoma, extranodal and solid organ sites: Secondary | ICD-10-CM

## 2011-12-31 DIAGNOSIS — C8330 Diffuse large B-cell lymphoma, unspecified site: Secondary | ICD-10-CM

## 2011-12-31 DIAGNOSIS — Z5189 Encounter for other specified aftercare: Secondary | ICD-10-CM

## 2011-12-31 MED ORDER — PEGFILGRASTIM INJECTION 6 MG/0.6ML
6.0000 mg | Freq: Once | SUBCUTANEOUS | Status: AC
  Administered 2011-12-31: 6 mg via SUBCUTANEOUS
  Filled 2011-12-31: qty 0.6

## 2011-12-31 NOTE — Telephone Encounter (Signed)
Message left requesting a return call for update on status and to answer any questions.  Also asked that she drink lots of fluids/water.

## 2011-12-31 NOTE — Telephone Encounter (Signed)
Message copied by Augusto Garbe on Wed Dec 31, 2011  3:19 PM ------      Message from: Charlette Caffey      Created: Tue Dec 30, 2011 11:32 AM      Regarding: Chemo Follow up Call       [facility]-Chop Dr. Gaylyn Rong

## 2012-01-15 ENCOUNTER — Telehealth: Payer: Self-pay | Admitting: Family Medicine

## 2012-01-15 ENCOUNTER — Other Ambulatory Visit: Payer: Self-pay | Admitting: *Deleted

## 2012-01-15 MED ORDER — OMEPRAZOLE 20 MG PO CPDR: 20.0000 mg | DELAYED_RELEASE_CAPSULE | Freq: Every day | ORAL | Status: DC | PRN

## 2012-01-15 MED ORDER — SIMVASTATIN 40 MG PO TABS: 40.0000 mg | ORAL_TABLET | Freq: Every day | ORAL | Status: DC

## 2012-01-15 NOTE — Telephone Encounter (Signed)
OK to refill

## 2012-01-15 NOTE — Telephone Encounter (Signed)
Yes- she has appt with me and labs in march Will refill electronically

## 2012-01-15 NOTE — Telephone Encounter (Signed)
Pt left v.m wanting to know appt info for March. Left v/m for pt to call back. Pt has fasting lab appt 03/02/12 at 8:20 am and CPX with Dr Milinda Antis on 03/09/12 at 9:30 am.

## 2012-01-16 ENCOUNTER — Telehealth: Payer: Self-pay | Admitting: Family Medicine

## 2012-01-16 NOTE — Telephone Encounter (Signed)
Patient returned call to [facility] re: meds and had questions about rescheduling physical due to now taking chemo.

## 2012-01-16 NOTE — Telephone Encounter (Signed)
Pt advised.

## 2012-01-19 NOTE — Telephone Encounter (Signed)
Pt states she has already spoken with someone and has appt and med was called in.

## 2012-01-21 ENCOUNTER — Ambulatory Visit: Payer: Medicare Other | Admitting: Oncology

## 2012-01-21 ENCOUNTER — Telehealth: Payer: Self-pay | Admitting: Oncology

## 2012-01-21 ENCOUNTER — Ambulatory Visit (HOSPITAL_BASED_OUTPATIENT_CLINIC_OR_DEPARTMENT_OTHER): Payer: Medicare Other

## 2012-01-21 ENCOUNTER — Other Ambulatory Visit: Payer: Medicare Other | Admitting: Lab

## 2012-01-21 VITALS — BP 117/67 | HR 91 | Temp 97.8°F

## 2012-01-21 VITALS — BP 134/79 | HR 82 | Temp 97.3°F | Ht 63.0 in | Wt 144.0 lb

## 2012-01-21 DIAGNOSIS — C8330 Diffuse large B-cell lymphoma, unspecified site: Secondary | ICD-10-CM

## 2012-01-21 DIAGNOSIS — C8589 Other specified types of non-Hodgkin lymphoma, extranodal and solid organ sites: Secondary | ICD-10-CM

## 2012-01-21 DIAGNOSIS — Z5112 Encounter for antineoplastic immunotherapy: Secondary | ICD-10-CM

## 2012-01-21 LAB — CBC WITH DIFFERENTIAL/PLATELET
BASO%: 1.7 % (ref 0.0–2.0)
Basophils Absolute: 0.1 10*3/uL (ref 0.0–0.1)
EOS%: 0.7 % (ref 0.0–7.0)
Eosinophils Absolute: 0.1 10*3/uL (ref 0.0–0.5)
HCT: 37.6 % (ref 34.8–46.6)
HGB: 12.5 g/dL (ref 11.6–15.9)
LYMPH%: 21.4 % (ref 14.0–49.7)
MCH: 29.1 pg (ref 25.1–34.0)
MCHC: 33.2 g/dL (ref 31.5–36.0)
MCV: 87.4 fL (ref 79.5–101.0)
MONO#: 1 10*3/uL — ABNORMAL HIGH (ref 0.1–0.9)
MONO%: 13 % (ref 0.0–14.0)
NEUT#: 4.9 10*3/uL (ref 1.5–6.5)
NEUT%: 63.2 % (ref 38.4–76.8)
Platelets: 272 10*3/uL (ref 145–400)
RBC: 4.3 10*6/uL (ref 3.70–5.45)
RDW: 14.7 % — ABNORMAL HIGH (ref 11.2–14.5)
WBC: 7.7 10*3/uL (ref 3.9–10.3)
lymph#: 1.6 10*3/uL (ref 0.9–3.3)
nRBC: 0 % (ref 0–0)

## 2012-01-21 LAB — COMPREHENSIVE METABOLIC PANEL
ALT: 35 U/L (ref 0–35)
AST: 31 U/L (ref 0–37)
Albumin: 4 g/dL (ref 3.5–5.2)
Alkaline Phosphatase: 65 U/L (ref 39–117)
BUN: 20 mg/dL (ref 6–23)
CO2: 26 mEq/L (ref 19–32)
Calcium: 9.3 mg/dL (ref 8.4–10.5)
Chloride: 106 mEq/L (ref 96–112)
Creatinine, Ser: 0.78 mg/dL (ref 0.50–1.10)
Glucose, Bld: 100 mg/dL — ABNORMAL HIGH (ref 70–99)
Potassium: 4.2 mEq/L (ref 3.5–5.3)
Sodium: 140 mEq/L (ref 135–145)
Total Bilirubin: 0.3 mg/dL (ref 0.3–1.2)
Total Protein: 6.3 g/dL (ref 6.0–8.3)

## 2012-01-21 MED ORDER — SODIUM CHLORIDE 0.9 % IV SOLN
375.0000 mg/m2 | Freq: Once | INTRAVENOUS | Status: AC
  Administered 2012-01-21: 600 mg via INTRAVENOUS
  Filled 2012-01-21: qty 60

## 2012-01-21 MED ORDER — FAMOTIDINE IN NACL 20-0.9 MG/50ML-% IV SOLN
20.0000 mg | Freq: Once | INTRAVENOUS | Status: AC
  Administered 2012-01-21: 20 mg via INTRAVENOUS

## 2012-01-21 MED ORDER — DIPHENHYDRAMINE HCL 25 MG PO CAPS
50.0000 mg | ORAL_CAPSULE | Freq: Once | ORAL | Status: AC
  Administered 2012-01-21: 50 mg via ORAL

## 2012-01-21 MED ORDER — SODIUM CHLORIDE 0.9 % IV SOLN
750.0000 mg/m2 | Freq: Once | INTRAVENOUS | Status: AC
  Administered 2012-01-21: 1260 mg via INTRAVENOUS
  Filled 2012-01-21: qty 63

## 2012-01-21 MED ORDER — VINCRISTINE SULFATE CHEMO INJECTION 1 MG/ML
2.0000 mg | Freq: Once | INTRAVENOUS | Status: AC
  Administered 2012-01-21: 2 mg via INTRAVENOUS
  Filled 2012-01-21: qty 2

## 2012-01-21 MED ORDER — ACETAMINOPHEN 325 MG PO TABS
650.0000 mg | ORAL_TABLET | Freq: Once | ORAL | Status: AC
  Administered 2012-01-21: 650 mg via ORAL

## 2012-01-21 MED ORDER — ONDANSETRON 16 MG/50ML IVPB (CHCC)
16.0000 mg | Freq: Once | INTRAVENOUS | Status: AC
  Administered 2012-01-21: 16 mg via INTRAVENOUS

## 2012-01-21 MED ORDER — DEXAMETHASONE SODIUM PHOSPHATE 4 MG/ML IJ SOLN
20.0000 mg | Freq: Once | INTRAMUSCULAR | Status: AC
  Administered 2012-01-21: 20 mg via INTRAVENOUS

## 2012-01-21 MED ORDER — DOXORUBICIN HCL CHEMO IV INJECTION 2 MG/ML
50.0000 mg/m2 | Freq: Once | INTRAVENOUS | Status: AC
  Administered 2012-01-21: 84 mg via INTRAVENOUS
  Filled 2012-01-21: qty 42

## 2012-01-21 NOTE — Progress Notes (Signed)
Pepcid added to Rituxan premeds due to reaction of itching and hives with first cycle.

## 2012-01-21 NOTE — Progress Notes (Signed)
[name] OFFICE PROGRESS NOTE  Cc:  [name], MD, MD  DIAGNOSIS: Stage I Diffuse Large B-cell Lymphoma; IPSS of 1 given age; good prognosis.   CURRENT THERAPY: started  R-CHOP on 12/30/2011; goal for 3 cycles before consolidative radiation.    INTERVAL HISTORY: Tara Sloan 66 y.o. female returns after chemo R-CHOP 3 weeks ago.  She had some skin rash with Rituxan 3 weeks ago which resolved with Pepcid.  She had some constipation and mild fatigue.  However, she is still independent of all activities of daily living.  She has alopecia.  She denies severe fatigue, fever, headache, mucositis.  Patient denies eadache, visual changes, confusion, drenching night sweats, palpable lymph node swelling, mucositis, odynophagia, dysphagia, nausea vomiting, jaundice, chest pain, palpitation, shortness of breath, dyspnea on exertion, productive cough, gum bleeding, epistaxis, hematemesis, hemoptysis, abdominal pain, abdominal swelling, early satiety, melena, hematochezia, hematuria, skin rash, spontaneous bleeding, joint swelling, joint pain, heat or cold intolerance, bowel bladder incontinence, back pain, focal motor weakness, paresthesia, depression, suicidal or homocidal ideation, feeling hopelessness.  MEDICAL HISTORY: Past Medical History  Diagnosis Date  . HLD (hyperlipidemia)   . Osteopenia   . GERD (gastroesophageal reflux disease)   . Allergic rhinitis   . External hemorrhoid   . Arthritis     osteopenia  . Osteopenia   . 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 Dilation and curettage of uterus 1987  . Partial hysterectomy 1988    fibroids  . Tubal ligation 1987  . Colonoscopy 5/06    Ext. hemms  . Breast biopsy 12/03    Negative  . Tonsillectomy 11/07/2011    Procedure: TONSILLECTOMY;  Surgeon: Drema Halon, MD;  Location: [facility];  Service: ENT;   Laterality: N/A;    MEDICATIONS: Current Outpatient Prescriptions  Medication Sig Dispense Refill  . acetaminophen (TYLENOL) 325 MG tablet Take 650 mg by mouth every 6 (six) hours as needed. PAIN       . Aspirin-Acetaminophen (GOODY BODY PAIN) 500-325 MG PACK Take 0.5 packets by mouth every 4 (four) hours as needed. PAIN       . Calcium Carbonate-Vitamin D (CALCIUM 600/VITAMIN D) 600-400 MG-UNIT per tablet Take 1 tablet by mouth 2 (two) times daily.        . Ergocalciferol (VITAMIN D2) 2000 UNITS TABS Take 1 tablet by mouth daily.        Marland Kitchen lidocaine-prilocaine (EMLA) cream Apply topically as needed.  30 g  0  . Multiple Vitamin (MULTIVITAMIN) tablet Take 1 tablet by mouth daily.        . naproxen sodium (ANAPROX) 220 MG tablet Take 220 mg by mouth 2 (two) times daily as needed. PAIN        . omeprazole (PRILOSEC) 20 MG capsule Take 1 capsule (20 mg total) by mouth daily as needed. HEART BURN  30 capsule  3  . ondansetron (ZOFRAN) 8 MG tablet Take 1 tablet two times a day starting the day after chemo for 3 days. Then take 1 tablet two times a day as needed for nausea or vomiting.   30 tablet  1  . predniSONE (DELTASONE) 10 MG tablet Take 10 mg by mouth daily. As directed      . prochlorperazine (COMPAZINE) 10 MG tablet Take 1 tablet (10 mg total) by mouth every 6 (six) hours as needed (Nausea  or vomiting).  30 tablet  6  . promethazine (PHENERGAN) 25 MG tablet       . simvastatin (ZOCOR) 40 MG tablet Take 1 tablet (40 mg total) by mouth at bedtime.  30 tablet  3   No current facility-administered medications for this visit.   Facility-Administered Medications Ordered in Other Visits  Medication Dose Route Frequency Provider Last Rate Last Dose  . acetaminophen (TYLENOL) tablet 650 mg  650 mg Oral Once Jethro Bolus, MD      . cyclophosphamide (CYTOXAN) 1,260 mg in sodium chloride 0.9 % 250 mL chemo infusion  750 mg/m2 (Treatment Plan Actual) Intravenous Once Jethro Bolus, MD      . dexamethasone  (DECADRON) injection 20 mg  20 mg Intravenous Once Jethro Bolus, MD      . diphenhydrAMINE (BENADRYL) capsule 50 mg  50 mg Oral Once Jethro Bolus, MD      . DOXOrubicin (ADRIAMYCIN) chemo injection 84 mg  50 mg/m2 (Treatment Plan Actual) Intravenous Once Jethro Bolus, MD      . ondansetron (ZOFRAN) IVPB 16 mg  16 mg Intravenous Once Jethro Bolus, MD      . riTUXimab (RITUXAN) 600 mg in sodium chloride 0.9 % 250 mL chemo infusion  375 mg/m2 (Treatment Plan Actual) Intravenous Once Jethro Bolus, MD      . vinCRIStine (ONCOVIN) 2 mg in sodium chloride 0.9 % 18 mL chemo injection  2 mg Intravenous Once Jethro Bolus, MD        ALLERGIES:   has no known allergies.  REVIEW OF SYSTEMS:  The rest of the 14-point review of system was negative.   Filed Vitals:   01/21/12 0843  BP: 134/79  Pulse: 82  Temp: 97.3 F (36.3 C)   Wt Readings from Last 3 Encounters:  01/21/12 144 lb (65.318 kg)  12/19/11 142 lb 8 oz (64.638 kg)  12/02/11 141 lb (63.957 kg)   ECOG Performance status: 0  PHYSICAL EXAMINATION:   General:  well-nourished in no acute distress.  Eyes:  no scleral icterus.  ENT:  There were no oropharyngeal lesions.  Neck was without thyromegaly.  Lymphatics:  Negative cervical, supraclavicular or axillary adenopathy.  Respiratory: lungs were clear bilaterally without wheezing or crackles.  Cardiovascular:  Regular rate and rhythm, S1/S2, without murmur, rub or gallop.  There was no pedal edema.  GI:  abdomen was soft, flat, nontender, nondistended, without organomegaly.  Muscoloskeletal:  no spinal tenderness of palpation of vertebral spine.  Skin exam was without echymosis, petichae.  Neuro exam was nonfocal.  Patient was able to get on and off exam table without assistance.  Gait was normal.  Patient was alerted and oriented.  Attention was good.   Language was appropriate.  Mood was normal without depression.  Speech was not pressured.  Thought content was not tangential.      LABORATORY/RADIOLOGY DATA:  Lab Results   Component Value Date   WBC 7.7 01/21/2012   HGB 12.5 01/21/2012   HCT 37.6 01/21/2012   PLT 272 01/21/2012   GLUCOSE 102* 12/30/2011   CHOL 172 12/06/2010   TRIG 127.0 12/06/2010   HDL 40.80 12/06/2010   LDLCALC 106* 12/06/2010   ALT 20 12/30/2011   AST 23 12/30/2011   NA 141 12/30/2011   K 4.2 12/30/2011   CL 107 12/30/2011   CREATININE 0.88 12/30/2011   BUN 20 12/30/2011   CO2 22 12/30/2011   TSH 1.87 12/06/2010    ASSESSMENT AND PLAN:   Newly  diagnosed large B cell, NHL of right tonsil; s/p excisional biopsy; IPSS 1; good risk.  She tolerated chemotherapy well last time with grade 1 pruritus with Rituxan which resolved with Pepcid.  Will give Pepcid premed today.  She has mild grade 1 constipation from vincristine.  She has no dose limiting toxicity.  Thus, I recommended that she proceeds with cycle #2 of chemotherapy without dose modification.    I will see her in 3 weeks prior to the 3rd cycle of chemo.  Plan is for restaging after 3rd cycle and referral to Rad Onc for evaluation for consolidative radiation.

## 2012-01-21 NOTE — Telephone Encounter (Signed)
gv pt appts for 2/20 and 2/1. Also confirmed 1/31 appt.

## 2012-01-21 NOTE — Patient Instructions (Addendum)
Pt tolerated treatment today without difficulty. No reaction noted. Discharged to home, ambulatory with sister. Antiemetic instructions reviewed. Pt verbalized understanding. Pt will return to [facility] for Neulasta injection on 01/22/12.

## 2012-01-22 ENCOUNTER — Ambulatory Visit (HOSPITAL_BASED_OUTPATIENT_CLINIC_OR_DEPARTMENT_OTHER): Payer: Medicare Other

## 2012-01-22 VITALS — BP 112/69 | HR 81 | Temp 99.1°F

## 2012-01-22 DIAGNOSIS — C8330 Diffuse large B-cell lymphoma, unspecified site: Secondary | ICD-10-CM

## 2012-01-22 DIAGNOSIS — C8589 Other specified types of non-Hodgkin lymphoma, extranodal and solid organ sites: Secondary | ICD-10-CM

## 2012-01-22 MED ORDER — PEGFILGRASTIM INJECTION 6 MG/0.6ML
6.0000 mg | Freq: Once | SUBCUTANEOUS | Status: AC
  Administered 2012-01-22: 6 mg via SUBCUTANEOUS
  Filled 2012-01-22: qty 0.6

## 2012-02-11 ENCOUNTER — Ambulatory Visit: Payer: Medicare Other | Admitting: Oncology

## 2012-02-11 ENCOUNTER — Ambulatory Visit (HOSPITAL_BASED_OUTPATIENT_CLINIC_OR_DEPARTMENT_OTHER): Payer: Medicare Other

## 2012-02-11 ENCOUNTER — Other Ambulatory Visit (HOSPITAL_BASED_OUTPATIENT_CLINIC_OR_DEPARTMENT_OTHER): Payer: Medicare Other | Admitting: Lab

## 2012-02-11 VITALS — BP 136/85 | HR 98 | Temp 97.5°F | Ht 63.0 in | Wt 146.7 lb

## 2012-02-11 VITALS — BP 126/56 | HR 95 | Temp 97.0°F

## 2012-02-11 DIAGNOSIS — C8330 Diffuse large B-cell lymphoma, unspecified site: Secondary | ICD-10-CM

## 2012-02-11 DIAGNOSIS — Z5112 Encounter for antineoplastic immunotherapy: Secondary | ICD-10-CM

## 2012-02-11 DIAGNOSIS — C8589 Other specified types of non-Hodgkin lymphoma, extranodal and solid organ sites: Secondary | ICD-10-CM

## 2012-02-11 DIAGNOSIS — T7840XA Allergy, unspecified, initial encounter: Secondary | ICD-10-CM

## 2012-02-11 LAB — CBC WITH DIFFERENTIAL/PLATELET
BASO%: 0.8 % (ref 0.0–2.0)
Basophils Absolute: 0.1 10*3/uL (ref 0.0–0.1)
EOS%: 0.2 % (ref 0.0–7.0)
Eosinophils Absolute: 0 10*3/uL (ref 0.0–0.5)
HCT: 35.1 % (ref 34.8–46.6)
HGB: 11.8 g/dL (ref 11.6–15.9)
LYMPH%: 13 % — ABNORMAL LOW (ref 14.0–49.7)
MCH: 29.4 pg (ref 25.1–34.0)
MCHC: 33.6 g/dL (ref 31.5–36.0)
MCV: 87.3 fL (ref 79.5–101.0)
MONO#: 0.9 10*3/uL (ref 0.1–0.9)
MONO%: 9.6 % (ref 0.0–14.0)
NEUT#: 7.3 10*3/uL — ABNORMAL HIGH (ref 1.5–6.5)
NEUT%: 76.4 % (ref 38.4–76.8)
Platelets: 263 10*3/uL (ref 145–400)
RBC: 4.02 10*6/uL (ref 3.70–5.45)
RDW: 16.3 % — ABNORMAL HIGH (ref 11.2–14.5)
WBC: 9.6 10*3/uL (ref 3.9–10.3)
lymph#: 1.3 10*3/uL (ref 0.9–3.3)
nRBC: 0 % (ref 0–0)

## 2012-02-11 LAB — COMPREHENSIVE METABOLIC PANEL
ALT: 88 U/L — ABNORMAL HIGH (ref 0–35)
AST: 60 U/L — ABNORMAL HIGH (ref 0–37)
Albumin: 4.2 g/dL (ref 3.5–5.2)
Alkaline Phosphatase: 68 U/L (ref 39–117)
BUN: 20 mg/dL (ref 6–23)
CO2: 25 mEq/L (ref 19–32)
Calcium: 9.3 mg/dL (ref 8.4–10.5)
Chloride: 106 mEq/L (ref 96–112)
Creatinine, Ser: 0.98 mg/dL (ref 0.50–1.10)
Glucose, Bld: 111 mg/dL — ABNORMAL HIGH (ref 70–99)
Potassium: 4 mEq/L (ref 3.5–5.3)
Sodium: 139 mEq/L (ref 135–145)
Total Bilirubin: 0.3 mg/dL (ref 0.3–1.2)
Total Protein: 6.2 g/dL (ref 6.0–8.3)

## 2012-02-11 MED ORDER — ONDANSETRON 16 MG/50ML IVPB (CHCC)
16.0000 mg | Freq: Once | INTRAVENOUS | Status: AC
  Administered 2012-02-11: 16 mg via INTRAVENOUS

## 2012-02-11 MED ORDER — DIPHENHYDRAMINE HCL 25 MG PO CAPS
50.0000 mg | ORAL_CAPSULE | Freq: Once | ORAL | Status: AC
  Administered 2012-02-11: 50 mg via ORAL

## 2012-02-11 MED ORDER — SODIUM CHLORIDE 0.9 % IV SOLN
375.0000 mg/m2 | Freq: Once | INTRAVENOUS | Status: AC
  Administered 2012-02-11: 600 mg via INTRAVENOUS
  Filled 2012-02-11: qty 60

## 2012-02-11 MED ORDER — ACETAMINOPHEN 325 MG PO TABS
650.0000 mg | ORAL_TABLET | Freq: Once | ORAL | Status: AC
  Administered 2012-02-11: 650 mg via ORAL

## 2012-02-11 MED ORDER — SODIUM CHLORIDE 0.9 % IV SOLN
750.0000 mg/m2 | Freq: Once | INTRAVENOUS | Status: AC
  Administered 2012-02-11: 1260 mg via INTRAVENOUS
  Filled 2012-02-11: qty 63

## 2012-02-11 MED ORDER — FAMOTIDINE IN NACL 20-0.9 MG/50ML-% IV SOLN
20.0000 mg | Freq: Two times a day (BID) | INTRAVENOUS | Status: DC
  Administered 2012-02-11: 20 mg via INTRAVENOUS

## 2012-02-11 MED ORDER — DOXORUBICIN HCL CHEMO IV INJECTION 2 MG/ML
50.0000 mg/m2 | Freq: Once | INTRAVENOUS | Status: AC
  Administered 2012-02-11: 84 mg via INTRAVENOUS
  Filled 2012-02-11: qty 42

## 2012-02-11 MED ORDER — SODIUM CHLORIDE 0.9 % IV SOLN: Freq: Once | INTRAVENOUS | Status: DC

## 2012-02-11 MED ORDER — VINCRISTINE SULFATE CHEMO INJECTION 1 MG/ML
2.0000 mg | Freq: Once | INTRAVENOUS | Status: AC
  Administered 2012-02-11: 2 mg via INTRAVENOUS
  Filled 2012-02-11: qty 2

## 2012-02-11 MED ORDER — DEXAMETHASONE SODIUM PHOSPHATE 4 MG/ML IJ SOLN
20.0000 mg | Freq: Once | INTRAMUSCULAR | Status: AC
  Administered 2012-02-11: 20 mg via INTRAVENOUS

## 2012-02-11 NOTE — Progress Notes (Signed)
[facility] OFFICE PROGRESS NOTE  Cc:  [name], MD, MD  DIAGNOSIS: Stage I Diffuse Large B-cell Lymphoma; IPSS of 1 given age; good prognosis.   CURRENT THERAPY: started  R-CHOP on 12/30/2011; goal for 3 cycles before consolidative radiation.    INTERVAL HISTORY: Tara Sloan 66 y.o. female returns for regular follow up with her sister for regular follow up before the 3rd cycle of chemo today.  She reported some constipation which predated cancer diagnosis.  He has had mild to moderate fatigue; however, she is still independent of all activities of daily living.  She denies any palpable node swelling.    Patient denies headache, visual changes, confusion, drenching night sweats, mucositis, odynophagia, dysphagia, nausea vomiting, jaundice, chest pain, palpitation, shortness of breath, dyspnea on exertion, productive cough, gum bleeding, epistaxis, hematemesis, hemoptysis, abdominal pain, abdominal swelling, early satiety, melena, hematochezia, hematuria, skin rash, spontaneous bleeding, joint swelling, joint pain, heat or cold intolerance, bowel bladder incontinence, back pain, focal motor weakness, paresthesia, depression, suicidal or homocidal ideation, feeling hopelessness.   MEDICAL HISTORY: Past Medical History  Diagnosis Date  . HLD (hyperlipidemia)   . Osteopenia   . GERD (gastroesophageal reflux disease)   . Allergic rhinitis   . External hemorrhoid   . Arthritis     osteopenia  . Osteopenia   . 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 Dilation and curettage of uterus 1987  . Partial hysterectomy 1988    fibroids  . Tubal ligation 1987  . Colonoscopy 5/06    Ext. hemms  . Breast biopsy 12/03    Negative  . Tonsillectomy 11/07/2011    Procedure: TONSILLECTOMY;  Surgeon: Drema Halon, MD;  Location: [facility];  Service: ENT;  Laterality:  N/A;    MEDICATIONS: Current Outpatient Prescriptions  Medication Sig Dispense Refill  . acetaminophen (TYLENOL) 325 MG tablet Take 650 mg by mouth every 6 (six) hours as needed. PAIN       . Aspirin-Acetaminophen (GOODY BODY PAIN) 500-325 MG PACK Take 0.5 packets by mouth every 4 (four) hours as needed. PAIN       . Calcium Carbonate-Vitamin D (CALCIUM 600/VITAMIN D) 600-400 MG-UNIT per tablet Take 1 tablet by mouth 2 (two) times daily.        . Ergocalciferol (VITAMIN D2) 2000 UNITS TABS Take 1 tablet by mouth daily.        Marland Kitchen lidocaine-prilocaine (EMLA) cream Apply topically as needed.  30 g  0  . Multiple Vitamin (MULTIVITAMIN) tablet Take 1 tablet by mouth daily.        . naproxen sodium (ANAPROX) 220 MG tablet Take 220 mg by mouth 2 (two) times daily as needed. PAIN        . omeprazole (PRILOSEC) 20 MG capsule Take 1 capsule (20 mg total) by mouth daily as needed. HEART BURN  30 capsule  3  . ondansetron (ZOFRAN) 8 MG tablet Take 1 tablet two times a day starting the day after chemo for 3 days. Then take 1 tablet two times a day as needed for nausea or vomiting.   30 tablet  1  . predniSONE (DELTASONE) 10 MG tablet Take 10 mg by mouth daily. As directed      . prochlorperazine (COMPAZINE) 10 MG tablet Take 1 tablet (10 mg total) by mouth every 6 (six) hours as needed (Nausea or  vomiting).  30 tablet  6  . promethazine (PHENERGAN) 25 MG tablet       . simvastatin (ZOCOR) 40 MG tablet Take 1 tablet (40 mg total) by mouth at bedtime.  30 tablet  3   No current facility-administered medications for this visit.   Facility-Administered Medications Ordered in Other Visits  Medication Dose Route Frequency Provider Last Rate Last Dose  . 0.9 %  sodium chloride infusion   Intravenous Once Jethro Bolus, MD      . cyclophosphamide (CYTOXAN) 1,260 mg in sodium chloride 0.9 % 250 mL chemo infusion  750 mg/m2 (Treatment Plan Actual) Intravenous Once Jethro Bolus, MD      . dexamethasone (DECADRON)  injection 20 mg  20 mg Intravenous Once Jethro Bolus, MD   20 mg at 02/11/12 0925  . DOXOrubicin (ADRIAMYCIN) chemo injection 84 mg  50 mg/m2 (Treatment Plan Actual) Intravenous Once Jethro Bolus, MD      . ondansetron (ZOFRAN) IVPB 16 mg  16 mg Intravenous Once Jethro Bolus, MD   16 mg at 02/11/12 0925  . vinCRIStine (ONCOVIN) 2 mg in sodium chloride 0.9 % 18 mL chemo injection  2 mg Intravenous Once Jethro Bolus, MD        ALLERGIES:   has no known allergies.  REVIEW OF SYSTEMS:  The rest of the 14-point review of system was negative.   Filed Vitals:   02/11/12 0824  BP: 136/85  Pulse: 98  Temp: 97.5 F (36.4 C)   Wt Readings from Last 3 Encounters:  02/11/12 146 lb 11.2 oz (66.543 kg)  01/21/12 144 lb (65.318 kg)  12/19/11 142 lb 8 oz (64.638 kg)   ECOG Performance status: 1  PHYSICAL EXAMINATION:   General:  well-nourished in no acute distress.  Eyes:  no scleral icterus.  ENT:  There were no oropharyngeal lesions.  Neck was without thyromegaly.  Lymphatics:  Negative cervical, supraclavicular or axillary adenopathy.  Respiratory: lungs were clear bilaterally without wheezing or crackles.  Cardiovascular:  Regular rate and rhythm, S1/S2, without murmur, rub or gallop.  There was no pedal edema.  GI:  abdomen was soft, flat, nontender, nondistended, without organomegaly.  Muscoloskeletal:  no spinal tenderness of palpation of vertebral spine.  Skin exam was without echymosis, petichae.  Neuro exam was nonfocal.  Patient was able to get on and off exam table without assistance.  Gait was normal.  Patient was alerted and oriented.  Attention was good.   Language was appropriate.  Mood was normal without depression.  Speech was not pressured.  Thought content was not tangential.     LABORATORY/RADIOLOGY DATA:  Lab Results  Component Value Date   WBC 9.6 02/11/2012   HGB 11.8 02/11/2012   HCT 35.1 02/11/2012   PLT 263 02/11/2012   GLUCOSE 100* 01/21/2012   CHOL 172 12/06/2010   TRIG 127.0 12/06/2010    HDL 40.80 12/06/2010   LDLCALC 106* 12/06/2010   ALT 35 01/21/2012   AST 31 01/21/2012   NA 140 01/21/2012   K 4.2 01/21/2012   CL 106 01/21/2012   CREATININE 0.78 01/21/2012   BUN 20 01/21/2012   CO2 26 01/21/2012   TSH 1.87 12/06/2010    ASSESSMENT AND PLAN:   Newly diagnosed large B cell, NHL of right tonsil; s/p excisional biopsy; IPSS 1; good risk.  She tolerated chemotherapy well last time with grade 2 fatigue, grade 1 constipation from vincristine.  She has no dose limiting toxicity.  Thus, I recommended that  she proceeds with cycle #3 of chemotherapy without dose modification.    I requested restaging CT neck/chest/abdomen/pelvis in 3 weeks after this 3rd cycle of chemo and I'll see her the day after.  If she has no residual disease, then she should proceed to be evaluated by Rad Onc (she requested Dr. Chipper Herb) for role of consolidative radiation.   The length of time of the face-to-face encounter was 15 minutes. More than 50% of time was spent counseling and coordination of care.

## 2012-02-12 ENCOUNTER — Ambulatory Visit (HOSPITAL_BASED_OUTPATIENT_CLINIC_OR_DEPARTMENT_OTHER): Payer: Medicare Other

## 2012-02-12 VITALS — BP 143/84 | HR 88 | Temp 98.3°F

## 2012-02-12 DIAGNOSIS — C8330 Diffuse large B-cell lymphoma, unspecified site: Secondary | ICD-10-CM

## 2012-02-12 DIAGNOSIS — C8589 Other specified types of non-Hodgkin lymphoma, extranodal and solid organ sites: Secondary | ICD-10-CM

## 2012-02-12 DIAGNOSIS — Z5189 Encounter for other specified aftercare: Secondary | ICD-10-CM

## 2012-02-12 MED ORDER — PEGFILGRASTIM INJECTION 6 MG/0.6ML
6.0000 mg | Freq: Once | SUBCUTANEOUS | Status: AC
  Administered 2012-02-12: 6 mg via SUBCUTANEOUS
  Filled 2012-02-12: qty 0.6

## 2012-02-24 ENCOUNTER — Ambulatory Visit: Payer: Medicare Other

## 2012-02-24 ENCOUNTER — Ambulatory Visit: Payer: Medicare Other | Admitting: Radiation Oncology

## 2012-03-01 ENCOUNTER — Encounter: Payer: Self-pay | Admitting: Radiation Oncology

## 2012-03-01 NOTE — Progress Notes (Signed)
66 year old female. Married. Two children. Works full time as an Print production planner.   Newly diagnosed large B cell, NHL of right tonsil. Started R-CHOP on 12/30/2011; foal for 3 cycles. Dr. Gaylyn Rong recommends CT 3 weeks after third cycle of chemo. Referred to Dr. Dayton Scrape by Dr. Gaylyn Rong to discuss role of consolidative radiation.   Niece, Dr. Janann Colonel, is radiation oncologist in [place], [place].  NKDA No indication of a pacemaker No hx of radiation therapy

## 2012-03-02 ENCOUNTER — Other Ambulatory Visit (INDEPENDENT_AMBULATORY_CARE_PROVIDER_SITE_OTHER): Payer: Medicare Other

## 2012-03-02 ENCOUNTER — Ambulatory Visit
Admission: RE | Admit: 2012-03-02 | Discharge: 2012-03-02 | Disposition: A | Discharge status: Home / Self Care | Payer: Medicare Other | Source: Ambulatory Visit | Attending: Radiation Oncology | Admitting: Radiation Oncology

## 2012-03-02 ENCOUNTER — Encounter: Payer: Self-pay | Admitting: Radiation Oncology

## 2012-03-02 VITALS — BP 120/77 | HR 99 | Temp 97.6°F | Resp 18 | Ht 63.0 in | Wt 145.3 lb

## 2012-03-02 DIAGNOSIS — E785 Hyperlipidemia, unspecified: Secondary | ICD-10-CM | POA: Insufficient documentation

## 2012-03-02 DIAGNOSIS — M949 Disorder of cartilage, unspecified: Secondary | ICD-10-CM | POA: Insufficient documentation

## 2012-03-02 DIAGNOSIS — C8330 Diffuse large B-cell lymphoma, unspecified site: Secondary | ICD-10-CM

## 2012-03-02 DIAGNOSIS — M899 Disorder of bone, unspecified: Secondary | ICD-10-CM | POA: Insufficient documentation

## 2012-03-02 DIAGNOSIS — Z87891 Personal history of nicotine dependence: Secondary | ICD-10-CM | POA: Insufficient documentation

## 2012-03-02 DIAGNOSIS — R7309 Other abnormal glucose: Secondary | ICD-10-CM

## 2012-03-02 DIAGNOSIS — R739 Hyperglycemia, unspecified: Secondary | ICD-10-CM

## 2012-03-02 DIAGNOSIS — Z803 Family history of malignant neoplasm of breast: Secondary | ICD-10-CM | POA: Insufficient documentation

## 2012-03-02 DIAGNOSIS — Z510 Encounter for antineoplastic radiation therapy: Secondary | ICD-10-CM | POA: Insufficient documentation

## 2012-03-02 DIAGNOSIS — C8589 Other specified types of non-Hodgkin lymphoma, extranodal and solid organ sites: Secondary | ICD-10-CM | POA: Insufficient documentation

## 2012-03-02 DIAGNOSIS — Z79899 Other long term (current) drug therapy: Secondary | ICD-10-CM | POA: Insufficient documentation

## 2012-03-02 DIAGNOSIS — K219 Gastro-esophageal reflux disease without esophagitis: Secondary | ICD-10-CM | POA: Insufficient documentation

## 2012-03-02 LAB — LIPID PANEL
Cholesterol: 168 mg/dL (ref 0–200)
HDL: 34.2 mg/dL — ABNORMAL LOW (ref >39.00)
Total CHOL/HDL Ratio: 5
Triglycerides: 222 mg/dL — ABNORMAL HIGH (ref 0.0–149.0)
VLDL: 44.4 mg/dL — ABNORMAL HIGH (ref 0.0–40.0)

## 2012-03-02 LAB — LDL CHOLESTEROL, DIRECT: Direct LDL: 92.2 mg/dL

## 2012-03-02 LAB — HEMOGLOBIN A1C: Hgb A1c MFr Bld: 6.3 % (ref 4.6–6.5)

## 2012-03-02 NOTE — Progress Notes (Signed)
[facility] Radiation Oncology NEW PATIENT EVALUATION  Name: Tara Sloan MRN: [id number]  Date: 03/02/2012  DOB: 1946/05/27  Status: outpatient   CC: [name], MD, MD  Exie Parody, MD    REFERRING PHYSICIAN: Exie Parody, MD   DIAGNOSIS: Stage IAE diffuse large B cell non-Hodgkin's lymphoma     HISTORY OF PRESENT ILLNESS:  Tara Sloan is a 66 y.o. female who is seen today for the courtesy of Dr. Gaylyn Rong for consideration of consolidative radiation therapy in the management of her stage I AE diffuse large B cell non-Hodgkin's lymphoma she noted right tonsillar enlargement last fall and was treated with antibiotics with no interval change. There was no associated sore throat. She seen by Dr. 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 This was CD20 positive. Her staging workup included CT scans and a PET scan on 12/01/2011 showing asymmetric hypermetabolic activity within the posterior right oropharynx assistant with biopsy-proven tonsillar lymphoma. There was no adenopathy appreciated. There is no evidence for distant involvement. She was on to receive 3 cycles of R. CHOP under the direction of Dr. Gaylyn Rong and this was reasonably well tolerated. She is now seen for a possible consolidative radiation therapy. She denies B. symptoms.   PREVIOUS RADIATION THERAPY: No   PAST MEDICAL HISTORY:  has a past medical history of HLD (hyperlipidemia); Osteopenia; GERD (gastroesophageal reflux disease); Allergic rhinitis; External hemorrhoid; Arthritis; Osteopenia; and Diffuse large B cell lymphoma (11/2011).     PAST SURGICAL HISTORY:  Past Surgical History  Procedure Date  . Dilation and curettage of uterus 1987  . Partial hysterectomy 1988    fibroids  . Tubal ligation 1987  . Colonoscopy 5/06    Ext. hemms  . Breast biopsy 12/03    Negative  . Tonsillectomy 11/07/2011    Procedure:  TONSILLECTOMY;  Surgeon: Drema Halon, MD;  Location: [facility];  Service: ENT;  Laterality: N/A;     FAMILY HISTORY: family history includes Breast cancer in her paternal aunt; Cancer in her paternal aunts; Heart attack (age of onset:55) in her father; Hyperlipidemia in an unspecified family member; and Stroke (age of onset:79) in her mother.   SOCIAL HISTORY:  reports that she quit smoking about 37 years ago. She has never used smokeless tobacco. She reports that she drinks alcohol. She reports that she does not use illicit drugs. 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 She is married and has 2 children.   ALLERGIES: Review of patient's allergies indicates no known allergies.   MEDICATIONS:  Current Outpatient Prescriptions  Medication Sig Dispense Refill  . acetaminophen (TYLENOL) 325 MG tablet Take 650 mg by mouth every 6 (six) hours as needed. PAIN       . Aspirin-Acetaminophen (GOODY BODY PAIN) 500-325 MG PACK Take 0.5 packets by mouth every 4 (four) hours as needed. PAIN       . Calcium Carbonate-Vitamin D (CALCIUM 600/VITAMIN D) 600-400 MG-UNIT per tablet Take 1 tablet by mouth 2 (two) times daily.        . Ergocalciferol (VITAMIN D2) 2000 UNITS TABS Take 1 tablet by mouth daily.        Marland Kitchen lidocaine-prilocaine (EMLA) cream Apply topically as needed.  30 g  0  . Multiple Vitamin (MULTIVITAMIN) tablet Take 1 tablet by mouth daily.        . naproxen sodium (ANAPROX) 220 MG tablet  Take 220 mg by mouth 2 (two) times daily as needed. PAIN        . omeprazole (PRILOSEC) 20 MG capsule Take 1 capsule (20 mg total) by mouth daily as needed. HEART BURN  30 capsule  3  . simvastatin (ZOCOR) 40 MG tablet Take 1 tablet (40 mg total) by mouth at bedtime.  30 tablet  3  . ondansetron (ZOFRAN) 8 MG tablet Take 1 tablet two times a day starting the day after chemo for 3 days. Then take 1 tablet two times a day as needed for nausea or vomiting.   30 tablet   1  . predniSONE (DELTASONE) 10 MG tablet Take 10 mg by mouth daily. As directed      . prochlorperazine (COMPAZINE) 10 MG tablet Take 1 tablet (10 mg total) by mouth every 6 (six) hours as needed (Nausea or vomiting).  30 tablet  6  . promethazine (PHENERGAN) 25 MG tablet           REVIEW OF SYSTEMS:  Pertinent items are noted in HPI.    PHYSICAL EXAM:  height is 5\' 3"  (1.6 m) and weight is 145 lb 4.8 oz (65.908 kg). Her oral temperature is 97.6 F (36.4 C). Her blood pressure is 120/77 and her pulse is 99. Her respiration is 18.   Physical examination: Nodes: Without palpable lymphadenopathy in the neck. Oral cavity and oropharynx are unremarkable to inspection. Specifically, there is no evidence for persistent disease along the right oropharynx. There are numerous restorations along the mandibular and maxillary dentition. Indirect mirror examination likewise unremarkable. Right oropharynx normal to palpation. Cranial nerves II through XII intact.   LABORATORY DATA:  Lab Results  Component Value Date   WBC 9.6 02/11/2012   HGB 11.8 02/11/2012   HCT 35.1 02/11/2012   MCV 87.3 02/11/2012   PLT 263 02/11/2012   Lab Results  Component Value Date   NA 139 02/11/2012   K 4.0 02/11/2012   CL 106 02/11/2012   CO2 25 02/11/2012   Lab Results  Component Value Date   ALT 88* 02/11/2012   AST 60* 02/11/2012   ALKPHOS 68 02/11/2012   BILITOT 0.3 02/11/2012      IMPRESSION: Stage I AE diffuse large B cell non-Hodgkin's lymphoma . I explained to the patient, and her niece, Dr. Janann Colonel, a radiation oncologist in [place], that her management options include a total of 6 cycles of R. CHOP chemotherapy versus 3 cycles of R. CHOP chemotherapy followed by consolidative radiation therapy. He NCCN guidelines suggest that she undergo a repeat PET scan since she did have PET scan positivity along the right oropharynx prior to treatment. My understanding is a Dr. Gaylyn Rong has him scheduled for restaging CT  scans rather than a PET scan. Based on her initial clinical stage, the fact that she is a easily examined and there is no visible or palpable evidence for persistent disease, I am okay with omitting a PET scan with the patient's understanding. I do feel that she should have dental oncologic consultation with Dr. Robin Searing for construction of his scatter protection devices and fluoride trays in addition to dental evaluation. She is seen really by her dentist, so I do not anticipate any acute problems at this time. I feel that a dose of 3000 cGy in 15 sessions would be the appropriate dose in this setting. We discussed the potential acute and late toxicities of radiation therapy and she wishes to proceed as outlined. She'll meet with  Dr. Gaylyn Rong later this week when she has completed her staging workup and make a final decision. In the meantime, I'll have her seen by Dr. Robin Searing later this week or early next week. Consent was signed today. Her prognosis is excellent.   PLAN: As discussed above. She'll complete her restaging workup this week and then visited Dr. Robin Searing for dental oncology consultation and construction of scatter protection devices then return here later next week for simulation/treatment planning.   I spent 60 minutes minutes face to face with the patient and more than 50% of that time was spent in counseling and/or coordination of care.

## 2012-03-02 NOTE — Progress Notes (Signed)
Encounter addended by: Maryln Gottron, MD on: 03/02/2012  3:54 PM<BR>     Documentation filed: Flowsheet VN, Orders

## 2012-03-02 NOTE — Progress Notes (Signed)
Patient presents to the clinic today for a consultation with Dr. Dayton Scrape. Patient is alert and oriented to person, place, and time. No distress noted. Pleasant affect noted. Steady gait noted. Patient denies pain at this time. Patient denies nausea, vomiting, headache, or diarrhea. Patient reports that he weight has remained the same. Patient reports eating and sleeping without difficulty. Patient reports that she finished her third cycle of chemotherapy three weeks ago tomorrow. Patient is scheduled for an MRI tomorrow and a follow up with Dr. Gaylyn Rong. Patient has no complaints at this time. Reported all findings to Dr. Dayton Scrape.

## 2012-03-02 NOTE — Progress Notes (Signed)
Complete NUTRITION RISK SCREEN worksheet without concerns submitted to Barbara Neff, RD. Also, complete PATIENT MEASURE OF DISTRESS worksheet with a score of 0 submitted to social work.  

## 2012-03-02 NOTE — Progress Notes (Signed)
See progress note under physician encounter. 

## 2012-03-03 ENCOUNTER — Telehealth: Payer: Self-pay | Admitting: *Deleted

## 2012-03-03 ENCOUNTER — Encounter (HOSPITAL_COMMUNITY): Payer: Self-pay

## 2012-03-03 ENCOUNTER — Ambulatory Visit (HOSPITAL_COMMUNITY)
Admission: RE | Admit: 2012-03-03 | Discharge: 2012-03-03 | Disposition: A | Discharge status: Home / Self Care | Payer: Medicare Other | Source: Ambulatory Visit | Attending: Oncology | Admitting: Oncology

## 2012-03-03 ENCOUNTER — Other Ambulatory Visit (HOSPITAL_BASED_OUTPATIENT_CLINIC_OR_DEPARTMENT_OTHER): Payer: Medicare Other | Admitting: Lab

## 2012-03-03 DIAGNOSIS — M47817 Spondylosis without myelopathy or radiculopathy, lumbosacral region: Secondary | ICD-10-CM | POA: Insufficient documentation

## 2012-03-03 DIAGNOSIS — M47814 Spondylosis without myelopathy or radiculopathy, thoracic region: Secondary | ICD-10-CM | POA: Insufficient documentation

## 2012-03-03 DIAGNOSIS — K5710 Diverticulosis of small intestine without perforation or abscess without bleeding: Secondary | ICD-10-CM | POA: Insufficient documentation

## 2012-03-03 DIAGNOSIS — C8330 Diffuse large B-cell lymphoma, unspecified site: Secondary | ICD-10-CM

## 2012-03-03 DIAGNOSIS — I700 Atherosclerosis of aorta: Secondary | ICD-10-CM | POA: Insufficient documentation

## 2012-03-03 DIAGNOSIS — J984 Other disorders of lung: Secondary | ICD-10-CM | POA: Insufficient documentation

## 2012-03-03 DIAGNOSIS — Z90710 Acquired absence of both cervix and uterus: Secondary | ICD-10-CM | POA: Insufficient documentation

## 2012-03-03 DIAGNOSIS — E041 Nontoxic single thyroid nodule: Secondary | ICD-10-CM | POA: Insufficient documentation

## 2012-03-03 DIAGNOSIS — I2510 Atherosclerotic heart disease of native coronary artery without angina pectoris: Secondary | ICD-10-CM | POA: Insufficient documentation

## 2012-03-03 DIAGNOSIS — Q619 Cystic kidney disease, unspecified: Secondary | ICD-10-CM | POA: Insufficient documentation

## 2012-03-03 DIAGNOSIS — M47812 Spondylosis without myelopathy or radiculopathy, cervical region: Secondary | ICD-10-CM | POA: Insufficient documentation

## 2012-03-03 DIAGNOSIS — C8589 Other specified types of non-Hodgkin lymphoma, extranodal and solid organ sites: Secondary | ICD-10-CM | POA: Insufficient documentation

## 2012-03-03 LAB — CMP (CANCER CENTER ONLY)
ALT(SGPT): 64 U/L — ABNORMAL HIGH (ref 10–47)
AST: 50 U/L — ABNORMAL HIGH (ref 11–38)
Albumin: 3.7 g/dL (ref 3.3–5.5)
Alkaline Phosphatase: 71 U/L (ref 26–84)
BUN, Bld: 19 mg/dL (ref 7–22)
CO2: 28 mEq/L (ref 18–33)
Calcium: 9.1 mg/dL (ref 8.0–10.3)
Chloride: 99 mEq/L (ref 98–108)
Creat: 0.8 mg/dl (ref 0.6–1.2)
Glucose, Bld: 112 mg/dL (ref 73–118)
Potassium: 3.8 mEq/L (ref 3.3–4.7)
Sodium: 145 mEq/L (ref 128–145)
Total Bilirubin: 0.6 mg/dl (ref 0.20–1.60)
Total Protein: 6.8 g/dL (ref 6.4–8.1)

## 2012-03-03 LAB — CBC WITH DIFFERENTIAL/PLATELET
BASO%: 2.5 % — ABNORMAL HIGH (ref 0.0–2.0)
Basophils Absolute: 0.3 10*3/uL — ABNORMAL HIGH (ref 0.0–0.1)
EOS%: 0 % (ref 0.0–7.0)
Eosinophils Absolute: 0 10*3/uL (ref 0.0–0.5)
HCT: 34.4 % — ABNORMAL LOW (ref 34.8–46.6)
HGB: 11.7 g/dL (ref 11.6–15.9)
LYMPH%: 28 % (ref 14.0–49.7)
MCH: 30.1 pg (ref 25.1–34.0)
MCHC: 34 g/dL (ref 31.5–36.0)
MCV: 88.4 fL (ref 79.5–101.0)
MONO#: 1.4 10*3/uL — ABNORMAL HIGH (ref 0.1–0.9)
MONO%: 12.5 % (ref 0.0–14.0)
NEUT#: 6.2 10*3/uL (ref 1.5–6.5)
NEUT%: 57 % (ref 38.4–76.8)
Platelets: 198 10*3/uL (ref 145–400)
RBC: 3.89 10*6/uL (ref 3.70–5.45)
RDW: 18.5 % — ABNORMAL HIGH (ref 11.2–14.5)
WBC: 10.8 10*3/uL — ABNORMAL HIGH (ref 3.9–10.3)
lymph#: 3 10*3/uL (ref 0.9–3.3)
nRBC: 0 % (ref 0–0)

## 2012-03-03 LAB — VITAMIN D 25 HYDROXY (VIT D DEFICIENCY, FRACTURES): Vit D, 25-Hydroxy: 65 ng/mL (ref 30–89)

## 2012-03-03 MED ORDER — IOHEXOL 300 MG/ML  SOLN
125.0000 mL | Freq: Once | INTRAMUSCULAR | Status: AC | PRN
  Administered 2012-03-03: 125 mL via INTRAVENOUS

## 2012-03-03 NOTE — Telephone Encounter (Signed)
xxxx 

## 2012-03-03 NOTE — Progress Notes (Signed)
Encounter addended by: Agnes Lawrence, RN on: 03/03/2012  4:56 PM<BR>     Documentation filed: Charges VN, Inpatient Patient Education

## 2012-03-04 ENCOUNTER — Ambulatory Visit (HOSPITAL_BASED_OUTPATIENT_CLINIC_OR_DEPARTMENT_OTHER): Payer: Medicare Other | Admitting: Oncology

## 2012-03-04 ENCOUNTER — Telehealth: Payer: Self-pay | Admitting: Oncology

## 2012-03-04 VITALS — BP 122/75 | HR 98 | Temp 99.1°F | Ht 63.0 in | Wt 143.6 lb

## 2012-03-04 DIAGNOSIS — R7401 Elevation of levels of liver transaminase levels: Secondary | ICD-10-CM

## 2012-03-04 DIAGNOSIS — R7402 Elevation of levels of lactic acid dehydrogenase (LDH): Secondary | ICD-10-CM

## 2012-03-04 DIAGNOSIS — C8589 Other specified types of non-Hodgkin lymphoma, extranodal and solid organ sites: Secondary | ICD-10-CM

## 2012-03-04 DIAGNOSIS — C8330 Diffuse large B-cell lymphoma, unspecified site: Secondary | ICD-10-CM

## 2012-03-04 NOTE — Progress Notes (Signed)
[facility] OFFICE PROGRESS NOTE  Cc:  [name], MD, MD  DIAGNOSIS: Stage I Diffuse Large B-cell Lymphoma; IPSS of 1 given age; good prognosis.   PAST THERAPY: s/p 3 cycles of R-CHOP; last cycle in Feb 2013.   CURRENT THERAPY:  Pending consolidative radiation.    INTERVAL HISTORY: Tara Sloan 66 y.o. female returns for regular follow up by herself.  This 3rd cycle of chemo caused more fatigue than the first two.  She has now recovered about 80% compared to before starting chemo.  She denies mucositis, nausea/vomiting, palpable node swelling, night sweat.   Patient denies headache, visual changes, confusion, drenching night sweats, mucositis, odynophagia, dysphagia, nausea vomiting, jaundice, chest pain, palpitation, shortness of breath, dyspnea on exertion, productive cough, gum bleeding, epistaxis, hematemesis, hemoptysis, abdominal pain, abdominal swelling, early satiety, melena, hematochezia, hematuria, skin rash, spontaneous bleeding, joint swelling, joint pain, heat or cold intolerance, bowel bladder incontinence, back pain, focal motor weakness, paresthesia, depression, suicidal or homocidal ideation, feeling hopelessness.   MEDICAL HISTORY: Past Medical History  Diagnosis Date  . HLD (hyperlipidemia)   . Osteopenia   . GERD (gastroesophageal reflux disease)   . Allergic rhinitis   . External hemorrhoid   . Arthritis     osteopenia  . Osteopenia   . Diffuse large B cell lymphoma 11/2011    dx from tonsilar biopsy  . Cancer     nhl    SURGICAL HISTORY:  Past Surgical History  Procedure Date  . Dilation and curettage of uterus 1987  . Partial hysterectomy 1988    fibroids  . Tubal ligation 1987  . Colonoscopy 5/06    Ext. hemms  . Breast biopsy 12/03    Negative  . Tonsillectomy 11/07/2011    Procedure: TONSILLECTOMY;  Surgeon: Drema Halon, MD;  Location: [facility];  Service: ENT;  Laterality: N/A;     MEDICATIONS: Current Outpatient Prescriptions  Medication Sig Dispense Refill  . acetaminophen (TYLENOL) 325 MG tablet Take 650 mg by mouth every 6 (six) hours as needed. PAIN       . Aspirin-Acetaminophen (GOODY BODY PAIN) 500-325 MG PACK Take 0.5 packets by mouth every 4 (four) hours as needed. PAIN       . Calcium Carbonate-Vitamin D (CALCIUM 600/VITAMIN D) 600-400 MG-UNIT per tablet Take 1 tablet by mouth 2 (two) times daily.        . Ergocalciferol (VITAMIN D2) 2000 UNITS TABS Take 1 tablet by mouth daily.        Marland Kitchen lidocaine-prilocaine (EMLA) cream Apply topically as needed.  30 g  0  . Multiple Vitamin (MULTIVITAMIN) tablet Take 1 tablet by mouth daily.        . naproxen sodium (ANAPROX) 220 MG tablet Take 220 mg by mouth 2 (two) times daily as needed. PAIN        . omeprazole (PRILOSEC) 20 MG capsule Take 1 capsule (20 mg total) by mouth daily as needed. HEART BURN  30 capsule  3  . simvastatin (ZOCOR) 40 MG tablet Take 1 tablet (40 mg total) by mouth at bedtime.  30 tablet  3  . ondansetron (ZOFRAN) 8 MG tablet Take 1 tablet two times a day starting the day after chemo for 3 days. Then take 1 tablet two times a day as needed for nausea or vomiting.   30 tablet  1  . prochlorperazine (COMPAZINE) 10 MG tablet Take 1 tablet (10 mg total) by mouth every 6 (six) hours  as needed (Nausea or vomiting).  30 tablet  6    ALLERGIES:   has no known allergies.  REVIEW OF SYSTEMS:  The rest of the 14-point review of system was negative.   Filed Vitals:   03/04/12 1432  BP: 122/75  Pulse: 98  Temp: 99.1 F (37.3 C)   Wt Readings from Last 3 Encounters:  03/04/12 143 lb 9.6 oz (65.137 kg)  03/02/12 145 lb 4.8 oz (65.908 kg)  02/11/12 146 lb 11.2 oz (66.543 kg)   ECOG Performance status: 1  PHYSICAL EXAMINATION:   General:  well-nourished in no acute distress.  Eyes:  no scleral icterus.  ENT:  There were no oropharyngeal lesions.  Neck was without thyromegaly.  Lymphatics:   Negative cervical, supraclavicular or axillary adenopathy.  Respiratory: lungs were clear bilaterally without wheezing or crackles.  Cardiovascular:  Regular rate and rhythm, S1/S2, without murmur, rub or gallop.  There was no pedal edema.  GI:  abdomen was soft, flat, nontender, nondistended, without organomegaly.  Muscoloskeletal:  no spinal tenderness of palpation of vertebral spine.  Skin exam was without echymosis, petichae.  Neuro exam was nonfocal.  Patient was able to get on and off exam table without assistance.  Gait was normal.  Patient was alerted and oriented.  Attention was good.   Language was appropriate.  Mood was normal without depression.  Speech was not pressured.  Thought content was not tangential.     LABORATORY/RADIOLOGY DATA:  Lab Results  Component Value Date   WBC 10.8* 03/03/2012   HGB 11.7 03/03/2012   HCT 34.4* 03/03/2012   PLT 198 03/03/2012   GLUCOSE 112 03/03/2012   CHOL 168 03/02/2012   TRIG 222.0* 03/02/2012   HDL 34.20* 03/02/2012   LDLDIRECT 92.2 03/02/2012   LDLCALC 106* 12/06/2010   ALT 88* 02/11/2012   AST 50* 03/03/2012   NA 145 03/03/2012   K 3.8 03/03/2012   CL 99 03/03/2012   CREATININE 0.8 03/03/2012   BUN 19 03/03/2012   CO2 28 03/03/2012   TSH 1.87 12/06/2010   HGBA1C 6.3 03/02/2012    IMAGING:  I personally reviewed the CT neck/chest/abd/pel performed this week and showed the images to the patient.  There was no evidence of recurrent lymphoma.   ASSESSMENT AND PLAN:   1.  Newly diagnosed large B cell, NHL of right tonsil; s/p excisional biopsy; IPSS 1; good risk.  She is s/p 3 cycles of chemo with grade 1-2 fatigue.  There was no other side effects.  She has no evidence of residual or recurrent disease.  I advised her that she has finished chemo.  I recommended her to proceed consolidative XRT with Dr. Dayton Scrape.  I will follow up with her in 3 months.  Plan for future follow up will be q73month CT x 2 years and yearly thereafter.    2.  Slight  transaminitis:  Most likely due to recent chemo. CT abdomen did not show abnormality.  I'll continue to monitor this with future visit.     The length of time of the face-to-face encounter was 15 minutes. More than 50% of time was spent counseling and coordination of care.

## 2012-03-04 NOTE — Progress Notes (Signed)
[facility] END OF TREATMENT   Name: ABEL RA Date: 03/04/2012 MRN: [id number] DOB: [birth year]   TREATMENT DATES:  12/30/2011 to 02/11/2012.    REFERRING PHYSICIAN: Tower, Audrie Gallus, MD   DIAGNOSIS: Diffuse Large B-cell Lymphoma.   STAGE AT START OF TREATMENT: I   INTENT:Curative   DRUGS OR REGIMENS GIVEN:  R-CHOP   MAJOR TOXICITIES:  Grade 2 fatigue.    REASON TREATMENT STOPPED:  Finish of planned treatment.    PERFORMANCE STATUS AT END:  ECOG 1    ONGOING PROBLEMS: none except for mild fatigue.    FOLLOW UP PLANS:  Consolidative radiation with Dr. Dayton Scrape.  I'll see her in about 3 months.

## 2012-03-04 NOTE — Telephone Encounter (Signed)
Md/lab visit had been printed for pt then dr ha added port flushs and pt was called with these appts   aom

## 2012-03-05 ENCOUNTER — Encounter (HOSPITAL_COMMUNITY): Payer: Self-pay | Admitting: Dentistry

## 2012-03-05 ENCOUNTER — Ambulatory Visit (HOSPITAL_COMMUNITY): Payer: Self-pay | Admitting: Dentistry

## 2012-03-05 VITALS — BP 122/85 | HR 92 | Temp 97.5°F

## 2012-03-05 DIAGNOSIS — K08109 Complete loss of teeth, unspecified cause, unspecified class: Secondary | ICD-10-CM

## 2012-03-05 DIAGNOSIS — Z0189 Encounter for other specified special examinations: Secondary | ICD-10-CM

## 2012-03-05 DIAGNOSIS — C8589 Other specified types of non-Hodgkin lymphoma, extranodal and solid organ sites: Secondary | ICD-10-CM

## 2012-03-05 DIAGNOSIS — M264 Malocclusion, unspecified: Secondary | ICD-10-CM

## 2012-03-05 NOTE — Progress Notes (Signed)
DENTAL CONSULTATION  Date of Consultation:  03/05/2012 Patient Name:   Tara Sloan Date of Birth:   [birth year] Medical Record Number: [id number]  VITALS: BP 122/85  Pulse 92  Temp 97.5 F (36.4 C)   Reason for consultation: Patient needs a preradiation therapy dental evaluation.  Tara Sloan is a 66 year old female referred by Dr. Chipper Herb  for a Pre-Radiation therapy Dental Consultation.  She was recently diagnosed with non-Hodgkin's lymphoma of the right tonsil. Patient has undergone 3 cycles of chemotherapy with Dr. Gaylyn Rong. Patient with anticipated radiation therapy with Dr. Dayton Scrape. Patient now seen as part of a preradiation therapy dental protocol evaluation.  Patient currently denies acute toothache, swellings, or abscesses. Patient last saw her primary dentist on Monday, 03/01/2012 for an exam and cleaning. Patient sees Dr. Breck Coons for her dental needs. Patient denies having any acute dental problems. Patient usually sees the dentist twice a year.   PMH: Past Medical History  Diagnosis Date  . HLD (hyperlipidemia)   . Osteopenia   . GERD (gastroesophageal reflux disease)   . Allergic rhinitis   . External hemorrhoid   . Arthritis     osteopenia  . Osteopenia   . 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 Cancer     nhl    PSH: Past Surgical History  Procedure Date  . Dilation and curettage of uterus 1987  . Partial hysterectomy 1988    fibroids  . Tubal ligation 1987  . Colonoscopy 5/06    Ext. hemms  . Breast biopsy 12/03    Negative  . Tonsillectomy 11/07/2011    Procedure: TONSILLECTOMY;  Surgeon: Drema Halon, MD;  Location: [facility];  Service: ENT;  Laterality: N/A;    ALLERGIES: No Known Allergies  MEDICATIONS: Current Outpatient Prescriptions  Medication Sig Dispense Refill  . acetaminophen (TYLENOL) 325 MG tablet Take 650 mg by mouth every 6 (six) hours as needed. PAIN      . Aspirin-Acetaminophen (GOODY BODY PAIN) 500-325 MG PACK Take 0.5 packets by mouth every 4 (four) hours as needed. PAIN       . Calcium Carbonate-Vitamin D (CALCIUM 600/VITAMIN D) 600-400 MG-UNIT per tablet Take 1 tablet by mouth 2 (two) times daily.        . Ergocalciferol (VITAMIN D2) 2000 UNITS TABS Take 1 tablet by mouth daily.        Marland Kitchen lidocaine-prilocaine (EMLA) cream Apply topically as needed.  30 g  0  . Multiple Vitamin (MULTIVITAMIN) tablet Take 1 tablet by mouth daily.        . naproxen sodium (ANAPROX) 220 MG tablet Take 220 mg by mouth 2 (two) times daily as needed. PAIN        . omeprazole (PRILOSEC) 20 MG capsule Take 1 capsule (20 mg total) by mouth daily as needed. HEART BURN  30 capsule  3  . ondansetron (ZOFRAN) 8 MG tablet Take 1 tablet two times a day starting the day after chemo for 3 days. Then take 1 tablet two times a day as needed for nausea or vomiting.   30 tablet  1  . simvastatin (ZOCOR) 40 MG tablet Take 1 tablet (40 mg total) by mouth at bedtime.  30 tablet  3  . prochlorperazine (COMPAZINE) 10 MG tablet Take 1 tablet (10 mg total) by mouth every 6 (six) hours as needed (Nausea or vomiting).  30 tablet  6  LABS: Lab Results  Component Value Date   WBC 10.8* 03/03/2012   HGB 11.7 03/03/2012   HCT 34.4* 03/03/2012   MCV 88.4 03/03/2012   PLT 198 03/03/2012      Component Value Date/Time   NA 145 03/03/2012 1201   NA 139 02/11/2012 0812   K 3.8 03/03/2012 1201   K 4.0 02/11/2012 0812   CL 99 03/03/2012 1201   CL 106 02/11/2012 0812   CO2 28 03/03/2012 1201   CO2 25 02/11/2012 0812   GLUCOSE 112 03/03/2012 1201   GLUCOSE 111* 02/11/2012 0812   BUN 19 03/03/2012 1201   BUN 20 02/11/2012 0812   CREATININE 0.8 03/03/2012 1201   CREATININE 0.98 02/11/2012 0812   CALCIUM 9.1 03/03/2012 1201   CALCIUM 9.3 02/11/2012 0812   GFRNONAA 76.49 12/06/2010 0816   GFRAA 82 10/10/2008 1005   No results found for this basename: INR, PROTIME   No results found for this  basename: PTT    SOCIAL HISTORY: History   Social History  . Marital Status: Married    Spouse Name: N/A    Number of Children: 2  . Years of Education: N/A   Occupational History  . Employed     [employer], Print production planner   Social History Main Topics  . Smoking status: Former Smoker -- 5 years    Quit date: 11/04/1974  . Smokeless tobacco: Never Used  . Alcohol Use: Yes     Rare  . Drug Use: No  . Sexually Active: Yes    Birth Control/ Protection: None   Other Topics Concern  . Not on file   Social History Narrative   Married (husband with a lot of health problems and CAD)2 childrenExercise-going to CurvesEmployed-plans to retire at 89    FAMILY HISTORY: Family History  Problem Relation Age of Onset  . Heart attack Father 38  . Heart disease Father   . Stroke Mother 22  . Breast cancer Paternal Aunt   . Cancer Paternal Aunt   . Hyperlipidemia      Family history  . Cancer Paternal Aunt      REVIEW OF SYSTEMS: Reviewed with patient and negative with exception of above positive history.  DENTAL HISTORY: CC: Patient needs a preradiation therapy dental evaluation.  EAV:Tara Sloan is a 66 year old female referred by Dr. Chipper Herb  for a Pre-Radiation therapy Dental Consultation.  She was recently diagnosed with non-Hodgkin's lymphoma of the right tonsil. Patient has undergone 3 cycles of chemotherapy with Dr. Gaylyn Rong. Patient with anticipated radiation therapy with Dr. Dayton Scrape. Patient now seen as part of a preradiation therapy dental protocol evaluation.  Patient currently denies acute toothache, swellings, or abscesses. Patient last saw her primary dentist on Monday, 03/01/2012 for an exam and cleaning. Patient sees Dr. Breck Coons for her dental needs. Patient denies having any acute dental problems. Patient usually sees the dentist twice a year.   DENTAL EXAMINATION:  GENERAL: Patient is a well-developed, well-nourished female in no acute  distress. HEAD AND NECK: There is no palpable submandibular lymphadenopathy. The patient denies acute TMJ symptoms. INTRAORAL EXAM: Patient has normal saliva. There is a small mid palatal torus. DENTITION: Patient is missing tooth numbers 1, 6, 11, 16, 17, 22, 29, and 32. Most spaces have closed due to to tooth movement and drifting. Patient denies ever having had orthodontic therapy. PERIODONTAL: Patient with chronic periodontitis with minimal plaque and calculus accumulations, selective areas of gingival recession, and  no significant tooth mobility. DENTAL CARIES/SUBOPTIMAL RESTORATIONS: There are no obvious dental caries. Patient could be evaluated for future crown restorations as indicated. ENDODONTIC: Patient denies acute pulpitis symptoms. Patient has had previous root canal therapies associated with tooth numbers 4, 13, and 30. Patient also had an apical surgery associated with tooth #4 . I do not see any evidence of persistent periapical pathology. CROWN AND BRIDGE: Patient has multiple crown restorations that appear to be acceptable. Patient could be evaluated for future crown restorations. PROSTHODONTIC: Patient without need for partial dentures. OCCLUSION: She has a poor occlusal scheme but a stable occlusion.  RADIOGRAPHIC INTERPRETATION: A panoramic x-ray was obtained along with a full series of dental radiographs. There are multiple missing teeth. There is supra-eruption and drifting of the unopposed teeth into the edentulous areas. There are multiple dental restorations noted. There are previous root canal therapies noted. There are no obvious persistent periapical radiolucencies.   ASSESSMENTS: 1. Chronic periodontitis 2. Minimal accretions 3. Selective areas of gingival recession 4. No obvious tooth mobility 5. Multiple missing teeth. Most spaces have closed due to tooth movement. 6. Poor occlusal scheme but stable occlusion 7. Small, mid palatal torus 8. History of previous  root canal therapies with no obvious persistent periapical pathology or symptoms.  PLAN/RECOMMENDATIONS: 1. I discussed the risks, benefits, and complications of various treatment options with the patient in relationship to the medical and dental conditions. We discussed various treatment options to include no treatment, multiple extractions with alveoloplasty, pre-prosthetic surgery as indicated, periodontal therapy, dental restorations, root canal therapy, crown and bridge therapy, implant therapy, and replacement of missing teeth as indicated. The patient currently wishes to proceed with impression for the fabrication of upper and lower fluoride trays and scatter protection devices. No other treatment is desired at this time in patient will return to her primary dentist for periodontal recalls as indicated. Patient with a minimal dose of radiation therapy and anticipated for treatment of the non-Hodgkin's lymphoma. Patient should be at very minimal risk for osteoradionecrosis.   2. Discussion of findings with Dr. Chipper Herb and other medical and dental team members as indicated. Patient is scheduled for insertion of the fluoride trays and scatter protection devices on Monday, March 18 at 9:45 AM. Simulation may occur any time after this appointment .    Charlynne Pander, DDS

## 2012-03-05 NOTE — Patient Instructions (Signed)

## 2012-03-08 ENCOUNTER — Ambulatory Visit (HOSPITAL_COMMUNITY): Payer: Self-pay | Admitting: Dentistry

## 2012-03-08 VITALS — BP 119/71 | HR 90 | Temp 97.2°F

## 2012-03-08 DIAGNOSIS — Z463 Encounter for fitting and adjustment of dental prosthetic device: Secondary | ICD-10-CM

## 2012-03-08 DIAGNOSIS — Z0189 Encounter for other specified special examinations: Secondary | ICD-10-CM

## 2012-03-08 NOTE — Patient Instructions (Signed)
FLUORIDE TRAYS PATIENT INSTRUCTIONS    Obtain prescription from the pharmacy.  Don't be surprised if it needs to be ordered.   Be sure to let the pharmacy know when you are close to needing a new refill for them to have it ready for you without interruption of Fluoride use.   The best time to use your Fluoride is before bed time.   You must brush your teeth very well and floss before using the Fluoride in order to get the best use out of the Fluoride treatments.   Place 1 drop of Fluoride gel per tooth in the tray.   Place the tray on your lower teeth and/or your upper teeth.  Make sure the trays are seated all the way.  Remember, they only fit one way on your teeth.   Insert for 5 full minutes.   At the end of the 5 minutes, take the trays out.  SPIT OUT excess. .    Do NOT rinse your mouth!    Do NOT eat or drink after treatments for at least 30 minutes.  This is why the best time for your treatments is before bedtime.    Clean the inside of your Fluoride trays using COLD WATER and a toothbrush.    In order to keep your Trays from discoloring and free from odors, soak them overnight in denture cleaners such as Efferdent.  Do not use bleach or non denture products.    Store the trays in a safe dry place AWAY from any heat until your next treatment.    Bring the trays with you for your next dental check-up.  The dentist will confirm their fit.    If anything happens to your Fluoride trays, or they don't fit as well after any dental work, please let us know as soon as possible.  TRISMUS  Trismus is a condition where the jaw does not allow the mouth to open as wide as it usually does.  This can happen almost suddenly, or in other cases the process is so slow, it is hard to notice it-until it is too far along.  When the jaw joints and/or muscles have been exposed to radiation treatments, the onset of Trismus is very slow.  This is because the muscles are losing  their stretching ability over a long period of time, as long as 2 YEARS after the end of radiation.  It is therefore important to exercise these muscles and joints.  TRISMUS EXERCISES   Stack of tongue depressors measuring the same or a little less than the last documented MIO (Maximum Interincisal Opening).  Secure them with a rubber band on both ends.  Place the stack in the patient's mouth, supporting the other end.  Allow 30 seconds for muscle stretching.  Rest for a few seconds.  Repeat 3-5 times  For all radiation patients, this exercise is recommended in the mornings and evenings unless otherwise instructed.  The exercise should be done for a period of 2 YEARS after the end of radiation.  MIO should be checked routinely on recall dental visits by the general dentist or the hospital dentist.  The patient is advised to report any changes, soreness, or difficulties encountered when doing the exercises. 

## 2012-03-08 NOTE — Progress Notes (Signed)
03/08/2012  BP:   119/71   P:  90    T:   97.2 Tara Sloan presents for insertion of upper and lower fluoride trays and Scatter protection devices. Procedure: Appliances tried in and were adjusted prn. [place]. Trismus device fabricated as well with maximum interincisal opening of 50 mm and use of 32 sticks for trismus device. Postop instructions were provided in a written and verbal format on the use and care of appliances. All questions answered. RTC for periodic oral exam during radiation therapy in three weeks. Patient to call for appointment once treatment time is determined. Call if questions or problems before then. Dr. Dayton Scrape was contacted and patient is cleared for radiation therapy and simulation.   Dr. Cindra Eves

## 2012-03-09 ENCOUNTER — Encounter: Payer: BC Managed Care – PPO | Admitting: Family Medicine

## 2012-03-11 ENCOUNTER — Ambulatory Visit
Admission: RE | Admit: 2012-03-11 | Discharge: 2012-03-11 | Disposition: A | Discharge status: Home / Self Care | Payer: Medicare Other | Source: Ambulatory Visit | Attending: Radiation Oncology | Admitting: Radiation Oncology

## 2012-03-11 DIAGNOSIS — C8330 Diffuse large B-cell lymphoma, unspecified site: Secondary | ICD-10-CM

## 2012-03-11 NOTE — Progress Notes (Signed)
Simulation/treatment planning note:  The patient was taken to the CT simulator. A custom head cath was constructed for immobilization. Her head and neck was scanned. I chose and isocenter along her right tonsil. I requested and reviewed a fusion of her planning CT scan and pretreatment PET scan. I contoured her initial uptake as seen on her PET scan. She was set up to RAO and RPO fields and 2 separate multileaf collimators were designed to shield the normal surrounding structures. I prescribing 3000 cGy in 15 sessions. An isodose plan and dosimetry are requested.

## 2012-03-11 NOTE — Progress Notes (Signed)
Met with patient to discuss RO billing.   Attending Rad:  Dr. Dayton Scrape   Dx: [phone] Other lymphomas, unspecified site, Non Hodgkins Lymp   Rad Tx: ([id number] Extrl Beam)

## 2012-03-15 ENCOUNTER — Encounter: Payer: Self-pay | Admitting: Family Medicine

## 2012-03-15 ENCOUNTER — Ambulatory Visit (INDEPENDENT_AMBULATORY_CARE_PROVIDER_SITE_OTHER): Payer: Medicare Other | Admitting: Family Medicine

## 2012-03-15 VITALS — BP 120/70 | HR 90 | Temp 98.0°F | Ht 63.5 in | Wt 142.5 lb

## 2012-03-15 DIAGNOSIS — E785 Hyperlipidemia, unspecified: Secondary | ICD-10-CM

## 2012-03-15 DIAGNOSIS — R7309 Other abnormal glucose: Secondary | ICD-10-CM

## 2012-03-15 DIAGNOSIS — C8330 Diffuse large B-cell lymphoma, unspecified site: Secondary | ICD-10-CM

## 2012-03-15 DIAGNOSIS — R739 Hyperglycemia, unspecified: Secondary | ICD-10-CM

## 2012-03-15 DIAGNOSIS — C8589 Other specified types of non-Hodgkin lymphoma, extranodal and solid organ sites: Secondary | ICD-10-CM

## 2012-03-15 MED ORDER — OMEPRAZOLE 20 MG PO CPDR: 20.0000 mg | DELAYED_RELEASE_CAPSULE | Freq: Every day | ORAL | Status: DC | PRN

## 2012-03-15 MED ORDER — SIMVASTATIN 40 MG PO TABS: 40.0000 mg | ORAL_TABLET | Freq: Every day | ORAL | Status: DC

## 2012-03-15 NOTE — Patient Instructions (Signed)
If you are interested in a shingles/zoster vaccine - call your insurance to check on coverage,( you should not get it within 1 month of other vaccines) , then call us for a prescription  for it to take to a pharmacy that gives the shot  You also need a Tdap vaccine -- but medicare may not pay- check with them to see (if so it is much cheaper to get at the health dept)  Also make sure you get the OK from your oncologist about all vaccines Labs look good  Try to keep up the best you can with low sugar diet (when able) Follow up in about 6 months

## 2012-03-15 NOTE — Assessment & Plan Note (Signed)
Stable/ diet controlled On statin Disc low glycemic diet when able  At times - has to eat soft foods

## 2012-03-15 NOTE — Assessment & Plan Note (Signed)
Good control  Disc goals for lipids and reasons to control them Rev labs with pt Rev low sat fat diet in detail  On statin and diet

## 2012-03-15 NOTE — Progress Notes (Signed)
Subjective:    Patient ID: Tara Sloan, female    DOB: June 17, 1946, 66 y.o.   MRN: [id number]  HPI Here for check up of chronic medical conditions and to review health mt list    Pt had recent CT of abd/ chest/ neck for lymphoma  So far is doing very well  Caught it early Tonsillectomy  Has already done chemo -- finished that  Radiation is upcoming - and then will be done with it  Energy level is coming back   bp good 120/70  Wt is stable with bmi of 24  LFTs slt elevated but improved  Chol well controlled with statin and diet  Lab Results  Component Value Date   CHOL 168 03/02/2012   CHOL 172 12/06/2010   CHOL 175 04/19/2010   Lab Results  Component Value Date   HDL 34.20* 03/02/2012   HDL 40.80 12/06/2010   HDL 37.00* 04/19/2010   Lab Results  Component Value Date   LDLCALC 106* 12/06/2010   LDLCALC 104* 04/19/2010   LDLCALC 119* 10/12/2009   Lab Results  Component Value Date   TRIG 222.0* 03/02/2012   TRIG 127.0 12/06/2010   TRIG 171.0* 04/19/2010   Lab Results  Component Value Date   CHOLHDL 5 03/02/2012   CHOLHDL 4 12/06/2010   CHOLHDL 5 04/19/2010   Lab Results  Component Value Date   LDLDIRECT 92.2 03/02/2012    HDL is a bit low - less exercise than she has had in the past   Osteopenia - dexa 2/12 very mild Ca and D-doing well with that  Exercise -- is getting better with increase in energy    Hyperglycemia Lab Results  Component Value Date   HGBA1C 6.3 03/02/2012   overall well controlled with diet  Ate a lot of potato with limited diet for a while   Zoster status- has not had a vaccine   Due Tdap  colonosc 06 ? 7 years recall  Has not rec a letter yet     mammo 8/12- they send her a letter when due  Self exam-no lumps   Gyn exam - had partial hyst in past  No symptoms or problems at all   Patient Active Problem List  Diagnoses  . HYPERLIPIDEMIA  . ALLERGIC RHINITIS  . GERD  . OSTEOPENIA  . ALLERGY  . ARTHRITIS, HANDS,  BILATERAL  . Strep throat  . Diffuse large B cell lymphoma  . Screening for lipoid disorders  . Diabetes mellitus screening  . Hyperglycemia   Past Medical History  Diagnosis Date  . HLD (hyperlipidemia)   . Osteopenia   . GERD (gastroesophageal reflux disease)   . Allergic rhinitis   . External hemorrhoid   . Arthritis     osteopenia  . Osteopenia   . 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 Cancer     nhl   Past Surgical History  Procedure Date  . Dilation and curettage of uterus 1987  . Partial hysterectomy 1988    fibroids  . Tubal ligation 1987  . Colonoscopy 5/06    Ext. hemms  . Breast biopsy 12/03    Negative  . Tonsillectomy 11/07/2011    Procedure: TONSILLECTOMY;  Surgeon: Drema Halon, MD;  Location: [facility];  Service: ENT;  Laterality: N/A;   History  Substance Use Topics  . Smoking status: Former Smoker -- 5 years    Quit date: 11/04/1974  .  Smokeless tobacco: Never Used  . Alcohol Use: Yes     Rare   Family History  Problem Relation Age of Onset  . Heart attack Father 46  . Heart disease Father   . Stroke Mother 19  . Breast cancer Paternal Aunt   . Cancer Paternal Aunt   . Hyperlipidemia      Family history  . Cancer Paternal Aunt    No Known Allergies Current Outpatient Prescriptions on File Prior to Visit  Medication Sig Dispense Refill  . acetaminophen (TYLENOL) 325 MG tablet Take 650 mg by mouth every 6 (six) hours as needed. PAIN       . Aspirin-Acetaminophen (GOODY BODY PAIN) 500-325 MG PACK Take 0.5 packets by mouth every 4 (four) hours as needed. PAIN       . Calcium Carbonate-Vitamin D (CALCIUM 600/VITAMIN D) 600-400 MG-UNIT per tablet Take 1 tablet by mouth 2 (two) times daily.        . Ergocalciferol (VITAMIN D2) 2000 UNITS TABS Take 1 tablet by mouth daily.        Marland Kitchen lidocaine-prilocaine (EMLA) cream Apply topically as needed.  30 g  0  . Multiple Vitamin (MULTIVITAMIN)  tablet Take 1 tablet by mouth daily.        . naproxen sodium (ANAPROX) 220 MG tablet Take 220 mg by mouth 2 (two) times daily as needed. PAIN        . ondansetron (ZOFRAN) 8 MG tablet Take 1 tablet two times a day starting the day after chemo for 3 days. Then take 1 tablet two times a day as needed for nausea or vomiting.   30 tablet  1  . promethazine (PHENERGAN) 25 MG tablet Take 1 tablet (25 mg total) by mouth every 6 (six) hours as needed for nausea.  30 tablet  0  . DISCONTD: prochlorperazine (COMPAZINE) 10 MG tablet Take 1 tablet (10 mg total) by mouth every 6 (six) hours as needed (Nausea or vomiting).  30 tablet  6      Review of Systems Review of Systems  Constitutional: Negative for fever, appetite change,  and unexpected weight change. pos for fatigue that is improving  Eyes: Negative for pain and visual disturbance.  Respiratory: Negative for cough and shortness of breath.   Cardiovascular: Negative for cp or palpitations    Gastrointestinal: Negative for nausea, diarrhea and constipation.  Genitourinary: Negative for urgency and frequency.  Skin: Negative for pallor or rash   Neurological: Negative for weakness, light-headedness, numbness and headaches.  Hematological: Negative for adenopathy. Does not bruise/bleed easily.  Psychiatric/Behavioral: Negative for dysphoric mood. The patient is not nervous/anxious.          Objective:   Physical Exam  Constitutional: She appears well-developed and well-nourished. No distress.  HENT:  Head: Normocephalic and atraumatic.  Right Ear: External ear normal.  Left Ear: External ear normal.  Nose: Nose normal.  Mouth/Throat: Oropharynx is clear and moist.  Eyes: Conjunctivae and EOM are normal. Pupils are equal, round, and reactive to light. No scleral icterus.  Neck: Normal range of motion. Neck supple. No JVD present. Carotid bruit is not present. No thyromegaly present.  Cardiovascular: Normal rate, regular rhythm, normal  heart sounds and intact distal pulses.  Exam reveals no gallop.   Pulmonary/Chest: Effort normal and breath sounds normal. No respiratory distress. She has no wheezes.  Abdominal: Soft. Bowel sounds are normal. She exhibits no distension, no abdominal bruit and no mass. There is no tenderness.  Genitourinary:  No breast swelling, tenderness, discharge or bleeding.       Breast exam: No mass, nodules, thickening, tenderness, bulging, retraction, inflamation, nipple discharge or skin changes noted.  No axillary or clavicular LA.  Chaperoned exam.    Musculoskeletal: Normal range of motion. She exhibits no edema and no tenderness.  Lymphadenopathy:    She has no cervical adenopathy.  Neurological: She is alert. She has normal reflexes. No cranial nerve deficit. She exhibits normal muscle tone. Coordination normal.  Skin: Skin is warm and dry. No rash noted. No erythema. No pallor.  Psychiatric: She has a normal mood and affect.          Assessment & Plan:

## 2012-03-15 NOTE — Assessment & Plan Note (Signed)
Pt doing well after chemo and tonsillectomy Will be starting radiation soon  Is due for shingles vaccine and Tdap if she is safe to get them (also needs to check with her insurance)

## 2012-03-18 ENCOUNTER — Ambulatory Visit
Admission: RE | Admit: 2012-03-18 | Discharge: 2012-03-18 | Disposition: A | Discharge status: Home / Self Care | Payer: Medicare Other | Source: Ambulatory Visit | Attending: Radiation Oncology | Admitting: Radiation Oncology

## 2012-03-18 ENCOUNTER — Encounter: Payer: Self-pay | Admitting: Radiation Oncology

## 2012-03-18 NOTE — Progress Notes (Signed)
Simulation verification note:  The patient underwent simulation verification for treatment to her right tonsil. Her isocenter is in good position and the multileaf collimators contoured the treatment volume appropriately.

## 2012-03-22 ENCOUNTER — Encounter: Payer: Self-pay | Admitting: Radiation Oncology

## 2012-03-22 ENCOUNTER — Ambulatory Visit
Admission: RE | Admit: 2012-03-22 | Discharge: 2012-03-22 | Disposition: A | Discharge status: Home / Self Care | Payer: Medicare Other | Source: Ambulatory Visit | Attending: Radiation Oncology | Admitting: Radiation Oncology

## 2012-03-22 VITALS — BP 141/80 | HR 99 | Resp 18 | Wt 144.9 lb

## 2012-03-22 DIAGNOSIS — C8330 Diffuse large B-cell lymphoma, unspecified site: Secondary | ICD-10-CM

## 2012-03-22 NOTE — Progress Notes (Signed)
   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  CBCT/MVCT images/Port film x-rays were reviewed.  The chart was checked. She has a cold. No other complaints.  Physical Findings: Weight: 144 lb 14.4 oz (65.726 kg). She is sniffling. In no acute distress.  Impression:  The patient is tolerating radiotherapy.  Plan:  Continue radiotherapy as planned. I encouraged hydration to the patient.

## 2012-03-22 NOTE — Progress Notes (Signed)
Patient presents to the clinic today unaccompanied for an under treat visit with Dr. Basilio Cairo. Patient is alert and oriented to person, place, and time. No distress noted. Steady gait noted. Pleasant affect noted. Patient denies pain at this time. Patient reports a cold x 4 days. Patient reports a persistent productive cough with clear to green tinged sputum. Patient has no other complaints. Reported all findings to Dr. Basilio Cairo.

## 2012-03-23 ENCOUNTER — Ambulatory Visit
Admission: RE | Admit: 2012-03-23 | Discharge: 2012-03-23 | Disposition: A | Discharge status: Home / Self Care | Payer: Medicare Other | Source: Ambulatory Visit | Attending: Radiation Oncology | Admitting: Radiation Oncology

## 2012-03-23 NOTE — Progress Notes (Signed)
Received patient in the clinic today following treatment for post sim education with Sam, RN. Patient is alert and oriented to person, place, and time. No distress noted. Steady gait noted. Pleasant affect noted. Patient denies pain at this time. Oriented patient to staff and routine of the clinic. Provided patient with Radiation Therapy and You handbook then, reviewed pertinent information. Educated patient reference potential side effects and management. Provided patient with this writer's business card and encouraged to call with needs. Patient verbalized understanding of all things reviewed.

## 2012-03-24 ENCOUNTER — Ambulatory Visit
Admission: RE | Admit: 2012-03-24 | Discharge: 2012-03-24 | Disposition: A | Discharge status: Home / Self Care | Payer: Medicare Other | Source: Ambulatory Visit | Attending: Radiation Oncology | Admitting: Radiation Oncology

## 2012-03-25 ENCOUNTER — Ambulatory Visit
Admission: RE | Admit: 2012-03-25 | Discharge: 2012-03-25 | Disposition: A | Discharge status: Home / Self Care | Payer: Medicare Other | Source: Ambulatory Visit | Attending: Radiation Oncology | Admitting: Radiation Oncology

## 2012-03-25 ENCOUNTER — Ambulatory Visit (HOSPITAL_BASED_OUTPATIENT_CLINIC_OR_DEPARTMENT_OTHER): Payer: Medicare Other

## 2012-03-25 VITALS — BP 127/72 | HR 85 | Temp 97.7°F

## 2012-03-25 DIAGNOSIS — C8590 Non-Hodgkin lymphoma, unspecified, unspecified site: Secondary | ICD-10-CM

## 2012-03-25 DIAGNOSIS — C8589 Other specified types of non-Hodgkin lymphoma, extranodal and solid organ sites: Secondary | ICD-10-CM

## 2012-03-25 DIAGNOSIS — Z469 Encounter for fitting and adjustment of unspecified device: Secondary | ICD-10-CM

## 2012-03-25 MED ORDER — SODIUM CHLORIDE 0.9 % IJ SOLN
10.0000 mL | INTRAMUSCULAR | Status: DC | PRN
  Administered 2012-03-25: 10 mL via INTRAVENOUS
  Filled 2012-03-25: qty 10

## 2012-03-25 MED ORDER — HEPARIN SOD (PORK) LOCK FLUSH 100 UNIT/ML IV SOLN
500.0000 [IU] | Freq: Once | INTRAVENOUS | Status: AC
  Administered 2012-03-25: 500 [IU] via INTRAVENOUS
  Filled 2012-03-25: qty 5

## 2012-03-26 ENCOUNTER — Encounter: Payer: Self-pay | Admitting: Family Medicine

## 2012-03-26 ENCOUNTER — Telehealth: Payer: Self-pay | Admitting: Family Medicine

## 2012-03-26 ENCOUNTER — Ambulatory Visit (INDEPENDENT_AMBULATORY_CARE_PROVIDER_SITE_OTHER): Payer: Medicare Other | Admitting: Family Medicine

## 2012-03-26 ENCOUNTER — Ambulatory Visit
Admission: RE | Admit: 2012-03-26 | Discharge: 2012-03-26 | Disposition: A | Discharge status: Home / Self Care | Payer: Medicare Other | Source: Ambulatory Visit | Attending: Radiation Oncology | Admitting: Radiation Oncology

## 2012-03-26 VITALS — BP 110/70 | HR 84 | Temp 98.1°F | Wt 145.0 lb

## 2012-03-26 DIAGNOSIS — H109 Unspecified conjunctivitis: Secondary | ICD-10-CM

## 2012-03-26 MED ORDER — POLYMYXIN B-TRIMETHOPRIM 10000-0.1 UNIT/ML-% OP SOLN: 1.0000 [drp] | OPHTHALMIC | Status: AC

## 2012-03-26 MED ORDER — AMOXICILLIN-POT CLAVULANATE 875-125 MG PO TABS: 1.0000 | ORAL_TABLET | Freq: Two times a day (BID) | ORAL | Status: AC

## 2012-03-26 NOTE — Telephone Encounter (Signed)
Triage Record Num: [id number] Operator: Caswell Corwin Patient Name: Tara Sloan Call Date & Time: 03/26/2012 10:21:51AM Patient Phone: [phone] PCP: Audrie Gallus. Tower Patient Gender: Female PCP Fax : Patient DOB: 07/25/1946 Practice Name: [facility] Reason for Call: Caller: [place]/Patient; PCP: [name] A.; CB#: [phone]; Call regarding Eye Redness, Drainage (alt #[phone]); Awoke this AM R eye matted shut with yellow discharge. Also has sinus drainage and cough. Triaged Eye-Infection or Irritation and disp = see in 4 hrs for discharge and tenderness around the R eye. Appt made for today at 1345 with Dayton Martes. Home care and call back inst given. Protocol(s) Used: Eye: Infection or Irritation Recommended Outcome per Protocol: See Provider within 4 hours Reason for Outcome: Pain with tenderness, swelling or redness above or below eyes or near ears over sinuses Care Advice: ~ Another adult should drive if vision is impaired. ~ Call provider if symptoms worsen or new symptoms develop. Analgesic/Antipyretic Advice - NSAIDs: Consider aspirin, ibuprofen, naproxen or ketoprofen for pain or fever as directed on label or by pharmacist/provider. PRECAUTIONS: - If over 59 years of age, should not take longer than 1 week without consulting provider. EXCEPTIONS: - Should not be used if taking blood thinners or have bleeding problems. - Do not use if have history of sensitivity/allergy to any of these medications; or history of cardiovascular, ulcer, kidney, liver disease or diabetes unless approved by provider. - Do not exceed recommended dose or frequency. ~ 03/26/2012 10:39:08AM Page 1 of 1 [id number]

## 2012-03-26 NOTE — Progress Notes (Signed)
SUBJECTIVE:  66 y.o. female with burning, redness, discharge and mattering in right eye for 1 days.  No other symptoms.  No significant prior ophthalmological history. No change in visual acuity, no photophobia, no severe eye pain.  Started having URI symptoms a couple of days ago- cough, congestion. No fevers, chills, CP or SOB.  Currently undergoing radiation for Lymphoma.  Per pt, CBC is ok.  Patient Active Problem List  Diagnoses  . HYPERLIPIDEMIA  . ALLERGIC RHINITIS  . GERD  . OSTEOPENIA  . ALLERGY  . ARTHRITIS, HANDS, BILATERAL  . Strep throat  . Diffuse large B cell lymphoma  . Screening for lipoid disorders  . Diabetes mellitus screening  . Hyperglycemia   Past Medical History  Diagnosis Date  . HLD (hyperlipidemia)   . Osteopenia   . GERD (gastroesophageal reflux disease)   . Allergic rhinitis   . External hemorrhoid   . Arthritis     osteopenia  . Osteopenia   . 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 Cancer     nhl   Past Surgical History  Procedure Date  . Dilation and curettage of uterus 1987  . Partial hysterectomy 1988    fibroids  . Tubal ligation 1987  . Colonoscopy 5/06    Ext. hemms  . Breast biopsy 12/03    Negative  . Tonsillectomy 11/07/2011    Procedure: TONSILLECTOMY;  Surgeon: Drema Halon, MD;  Location: [facility];  Service: ENT;  Laterality: N/A;   History  Substance Use Topics  . Smoking status: Former Smoker -- 5 years    Quit date: 11/04/1974  . Smokeless tobacco: Never Used  . Alcohol Use: Yes     Rare   Family History  Problem Relation Age of Onset  . Heart attack Father 7  . Heart disease Father   . Stroke Mother 75  . Breast cancer Paternal Aunt   . Cancer Paternal Aunt   . Hyperlipidemia      Family history  . Cancer Paternal Aunt    No Known Allergies Current Outpatient Prescriptions on File Prior to Visit  Medication Sig Dispense Refill  . acetaminophen  (TYLENOL) 325 MG tablet Take 650 mg by mouth every 6 (six) hours as needed. PAIN       . Aspirin-Acetaminophen (GOODY BODY PAIN) 500-325 MG PACK Take 0.5 packets by mouth every 4 (four) hours as needed. PAIN       . Calcium Carbonate-Vitamin D (CALCIUM 600/VITAMIN D) 600-400 MG-UNIT per tablet Take 1 tablet by mouth 2 (two) times daily.        . Ergocalciferol (VITAMIN D2) 2000 UNITS TABS Take 1 tablet by mouth daily.        Marland Kitchen lidocaine-prilocaine (EMLA) cream Apply topically as needed.  30 g  0  . Multiple Vitamin (MULTIVITAMIN) tablet Take 1 tablet by mouth daily.        . naproxen sodium (ANAPROX) 220 MG tablet Take 220 mg by mouth 2 (two) times daily as needed. PAIN        . omeprazole (PRILOSEC) 20 MG capsule Take 1 capsule (20 mg total) by mouth daily as needed. HEART BURN  30 capsule  11  . ondansetron (ZOFRAN) 8 MG tablet Take 1 tablet two times a day starting the day after chemo for 3 days. Then take 1 tablet two times a day as needed for nausea or vomiting.   30 tablet  1  .  promethazine (PHENERGAN) 25 MG tablet Take 1 tablet (25 mg total) by mouth every 6 (six) hours as needed for nausea.  30 tablet  0  . simvastatin (ZOCOR) 40 MG tablet Take 1 tablet (40 mg total) by mouth at bedtime.  30 tablet  11  . DISCONTD: prochlorperazine (COMPAZINE) 10 MG tablet Take 1 tablet (10 mg total) by mouth every 6 (six) hours as needed (Nausea or vomiting).  30 tablet  6   The PMH, PSH, Social History, Family History, Medications, and allergies have been reviewed in [facility], and have been updated if relevant.  OBJECTIVE:  BP 110/70  Pulse 84  Temp(Src) 98.1 F (36.7 C) (Oral)  Wt 145 lb (65.772 kg)  Patient appears well, vitals signs are normal. Eyes: right eye with findings of typical conjunctivitis noted; erythema and discharge. PERRLA, no foreign body noted. No periorbital cellulitis. The corneas are clear and fundi normal. Visual acuity normal.   ASSESSMENT:  Conjunctivitis - probably  bacterial  PLAN:  Antibiotic drops per order. Hygiene discussed. If other family members develop same condition, may use same medication for them if they are not known to be allergic to it. Call prn.

## 2012-03-26 NOTE — Patient Instructions (Signed)
Conjunctivitis Conjunctivitis is commonly called "pink eye." Conjunctivitis can be caused by bacterial or viral infection, allergies, or injuries. There is usually redness of the lining of the eye, itching, discomfort, and sometimes discharge. There may be deposits of matter along the eyelids. A viral infection usually causes a watery discharge, while a bacterial infection causes a yellowish, thick discharge. Pink eye is very contagious and spreads by direct contact. You may be given antibiotic eyedrops as part of your treatment. Before using your eye medicine, remove all drainage from the eye by washing gently with warm water and cotton balls. Continue to use the medication until you have awakened 2 mornings in a row without discharge from the eye. Do not rub your eye. This increases the irritation and helps spread infection. Use separate towels from other household members. Wash your hands with soap and water before and after touching your eyes. Use cold compresses to reduce pain and sunglasses to relieve irritation from light. Do not wear contact lenses or wear eye makeup until the infection is gone. SEEK MEDICAL CARE IF:   Your symptoms are not better after 3 days of treatment.   You have increased pain or trouble seeing.   The outer eyelids become very red or swollen.  Document Released: 01/15/2005 Document Revised: 11/27/2011 Document Reviewed: 12/08/2005 [employer] Patient Information 2012 [employer]. 

## 2012-03-26 NOTE — Telephone Encounter (Signed)
Has appt today

## 2012-03-29 ENCOUNTER — Encounter: Payer: Self-pay | Admitting: Radiation Oncology

## 2012-03-29 ENCOUNTER — Ambulatory Visit
Admission: RE | Admit: 2012-03-29 | Discharge: 2012-03-29 | Disposition: A | Discharge status: Home / Self Care | Payer: Medicare Other | Source: Ambulatory Visit | Attending: Radiation Oncology | Admitting: Radiation Oncology

## 2012-03-29 VITALS — BP 123/76 | HR 92 | Resp 18 | Wt 144.3 lb

## 2012-03-29 DIAGNOSIS — C8330 Diffuse large B-cell lymphoma, unspecified site: Secondary | ICD-10-CM

## 2012-03-29 NOTE — Progress Notes (Signed)
Patient presents to the clinic today unaccompanied for under treat visit with Dr. Dayton Scrape. Patient is alert and oriented to person, place, and time. No distress noted. Steady gait noted. Pleasant affect noted. Patient denies pain at this time. Patient reports that today has been the best day for her since Easter. Patient reports that she is recovering from pink eye and an upper respiratory infection after taking amoxicillin. Patient denies cough. Patient reports shortness of breath with exertion but, says its better than it was. Patient has no complaints. Reported all findings to Dr. Dayton Scrape.

## 2012-03-29 NOTE — Progress Notes (Signed)
Weekly Management Note:  Site:R tonsil/neck Current Dose:  1200  cGy Projected Dose: 3000  cGy  Narrative: The patient is seen today for routine under treatment assessment. CBCT/MVCT images/port films were reviewed. The chart was reviewed.   She is without complaints today. She is recovering from "pink eye" and a URI.  Physical Examination:  Filed Vitals:   03/29/12 1423  BP: 123/76  Pulse: 92  Resp: 18  .  Weight: 144 lb 4.8 oz (65.454 kg). There is no palpable adenopathy along the neck and oral cavity and oropharynx are unremarkable to inspection.  Impression: Tolerating radiation therapy well.  Plan: Continue radiation therapy as planned.

## 2012-03-30 ENCOUNTER — Ambulatory Visit
Admission: RE | Admit: 2012-03-30 | Discharge: 2012-03-30 | Disposition: A | Discharge status: Home / Self Care | Payer: Medicare Other | Source: Ambulatory Visit | Attending: Radiation Oncology | Admitting: Radiation Oncology

## 2012-03-31 ENCOUNTER — Ambulatory Visit
Admission: RE | Admit: 2012-03-31 | Discharge: 2012-03-31 | Disposition: A | Discharge status: Home / Self Care | Payer: Medicare Other | Source: Ambulatory Visit | Attending: Radiation Oncology | Admitting: Radiation Oncology

## 2012-04-01 ENCOUNTER — Ambulatory Visit
Admission: RE | Admit: 2012-04-01 | Discharge: 2012-04-01 | Disposition: A | Discharge status: Home / Self Care | Payer: Medicare Other | Source: Ambulatory Visit | Attending: Radiation Oncology | Admitting: Radiation Oncology

## 2012-04-02 ENCOUNTER — Ambulatory Visit
Admission: RE | Admit: 2012-04-02 | Discharge: 2012-04-02 | Disposition: A | Discharge status: Home / Self Care | Payer: Medicare Other | Source: Ambulatory Visit | Attending: Radiation Oncology | Admitting: Radiation Oncology

## 2012-04-05 ENCOUNTER — Ambulatory Visit
Admission: RE | Admit: 2012-04-05 | Discharge: 2012-04-05 | Disposition: A | Discharge status: Home / Self Care | Payer: Medicare Other | Source: Ambulatory Visit | Attending: Radiation Oncology | Admitting: Radiation Oncology

## 2012-04-06 ENCOUNTER — Encounter: Payer: Self-pay | Admitting: Radiation Oncology

## 2012-04-06 ENCOUNTER — Ambulatory Visit
Admission: RE | Admit: 2012-04-06 | Discharge: 2012-04-06 | Disposition: A | Discharge status: Home / Self Care | Payer: Medicare Other | Source: Ambulatory Visit | Attending: Radiation Oncology | Admitting: Radiation Oncology

## 2012-04-06 VITALS — BP 123/78 | HR 79 | Resp 18 | Wt 143.6 lb

## 2012-04-06 DIAGNOSIS — C8330 Diffuse large B-cell lymphoma, unspecified site: Secondary | ICD-10-CM

## 2012-04-06 NOTE — Progress Notes (Signed)
Weekly Management Note:  Site:R tonsil/neck Current Dose:  2400  cGy Projected Dose: 3000  cGy  Narrative: The patient is seen today for routine under treatment assessment. CBCT/MVCT images/port films were reviewed. The chart was reviewed.   C7 more discomfort along her right oral cavity/tongue and throat. She inquires about using Hurricaine spray or Magic mouthwash. Her weight remained stable. She just finished a course of amoxicillin for conjunctivitis.  Physical Examination:  Filed Vitals:   04/06/12 0949  BP: 123/78  Pulse: 79  Resp: 18  .  Weight: 143 lb 9.6 oz (65.137 kg). There is no adenopathy in the neck. On inspection of the oral cavity there is focal mucositis, particularly along her dental restorations along the right mandibular molars. No evidence of candidiasis.  Impression: Tolerating radiation therapy well. She has just 3 further treatments, and I suggest that she take 1 Aleve 3 times a day along with saline rinses. I will see her in 2 days if she is not improved I will start Magic mouthwash. She finishes her treatment this Friday.  Plan: Continue radiation therapy as planned.

## 2012-04-06 NOTE — Progress Notes (Signed)
Patient presents to the clinic today unaccompanied for under treat visit with Dr. Dayton Scrape. Patient is alert and oriented to person, place, and time. No distress noted. Steady gait noted. Pleasant affect noted. Patient reports throat pain when swallowing 5 on a scale of 0-10. Patient reports that it hurts to eat, talk or swallow. Patient states,"if I have to move my tongue for any reason it hurts. Patient reports that she has sores along the right side of her tongue and mouth. Patient requesting magic mouthwash script and questioning if she can use HURRICAINE topical anesthic spray. Weight stable. Patient has no other complaints. Patient completed course of amoxicillin yesterday for sinus infection. Reported all findings to Dr. Dayton Scrape.

## 2012-04-07 ENCOUNTER — Ambulatory Visit
Admission: RE | Admit: 2012-04-07 | Discharge: 2012-04-07 | Disposition: A | Discharge status: Home / Self Care | Payer: Medicare Other | Source: Ambulatory Visit | Attending: Radiation Oncology | Admitting: Radiation Oncology

## 2012-04-08 ENCOUNTER — Ambulatory Visit
Admission: RE | Admit: 2012-04-08 | Discharge: 2012-04-08 | Disposition: A | Discharge status: Home / Self Care | Payer: Medicare Other | Source: Ambulatory Visit | Attending: Radiation Oncology | Admitting: Radiation Oncology

## 2012-04-08 ENCOUNTER — Other Ambulatory Visit: Payer: Self-pay | Admitting: Radiation Oncology

## 2012-04-08 ENCOUNTER — Encounter: Payer: Self-pay | Admitting: Radiation Oncology

## 2012-04-08 VITALS — BP 125/85 | HR 82 | Temp 98.5°F | Wt 142.0 lb

## 2012-04-08 DIAGNOSIS — C8330 Diffuse large B-cell lymphoma, unspecified site: Secondary | ICD-10-CM

## 2012-04-08 MED ORDER — HYDROCODONE-ACETAMINOPHEN 7.5-500 MG/15ML PO SOLN: 15.0000 mL | Freq: Four times a day (QID) | ORAL | Status: AC | PRN

## 2012-04-08 NOTE — Progress Notes (Signed)
Weekly Management Note:  Site:R tonsil/neck Current Dose:  2800  cGy Projected Dose: 3000  cGy  Narrative: The patient is seen today for routine under treatment assessment. CBCT/MVCT images/port films were reviewed. The chart was reviewed.   She is having more throat and right oral cavity discomfort. She has been using saltwater rinses and taking Aleve without much benefit.  Physical Examination:  Filed Vitals:   04/08/12 1007  BP: 125/85  Pulse: 82  Temp: 98.5 F (36.9 C)  .  Weight: 142 lb (64.411 kg). There is a confluent mucositis along the right oropharynx/tonsil and patchy mucositis along her right upper mucosa, worse along her mandibular molar restorations.  Impression: Tolerating radiation therapy well although she does have symptomatic mucositis as expected. I will start her on hydrocodone/Lortab elixir.  Plan: Continue radiation therapy as planned. She'll finish radiation therapy tomorrow. She was given a one-month followup visit.

## 2012-04-08 NOTE — Progress Notes (Addendum)
Ms Flanary seen today since she completes on tomorrow.  She has an ulcer on the right side of her tongue and the inside of her right cheek with redness in the cheek ara and and her throat. She has been gargling with salt water without relief.  Grades her pain as a level 8 and states if she eats it is worse.  Reports Thickened saliva and has dyshagia experiencing fatigue.    Encouraged to call for any continued pain once she completes tomorrow.

## 2012-04-09 ENCOUNTER — Ambulatory Visit
Admission: RE | Admit: 2012-04-09 | Discharge: 2012-04-09 | Disposition: A | Discharge status: Home / Self Care | Payer: Medicare Other | Source: Ambulatory Visit | Attending: Radiation Oncology | Admitting: Radiation Oncology

## 2012-04-12 ENCOUNTER — Encounter: Payer: Self-pay | Admitting: Radiation Oncology

## 2012-04-12 NOTE — Progress Notes (Signed)
[facility] Radiation Oncology End of Treatment Note  Name:Tara Sloan  Date: 04/12/2012 ZOX:[id number] DOB:1946/06/06   Status:outpatient    CC: [name], MD, MD  Dr. Jethro Bolus  REFERRING PHYSICIAN: Dr. 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                           PLAN: Routine followup in one month. Patient instructed to call if questions or worsening complaints in interim.

## 2012-04-28 ENCOUNTER — Ambulatory Visit (HOSPITAL_COMMUNITY): Payer: Self-pay | Admitting: Dentistry

## 2012-04-28 ENCOUNTER — Other Ambulatory Visit (HOSPITAL_COMMUNITY): Payer: Self-pay | Admitting: Dentistry

## 2012-04-28 ENCOUNTER — Encounter (HOSPITAL_COMMUNITY): Payer: Self-pay | Admitting: Dentistry

## 2012-04-28 VITALS — BP 123/71 | HR 84 | Temp 97.6°F | Wt 137.0 lb

## 2012-04-28 DIAGNOSIS — Z87898 Personal history of other specified conditions: Secondary | ICD-10-CM

## 2012-04-28 DIAGNOSIS — R1310 Dysphagia, unspecified: Secondary | ICD-10-CM

## 2012-04-28 DIAGNOSIS — Z09 Encounter for follow-up examination after completed treatment for conditions other than malignant neoplasm: Secondary | ICD-10-CM

## 2012-04-28 DIAGNOSIS — K117 Disturbances of salivary secretion: Secondary | ICD-10-CM

## 2012-04-28 DIAGNOSIS — K1233 Oral mucositis (ulcerative) due to radiation: Secondary | ICD-10-CM

## 2012-04-28 DIAGNOSIS — R432 Parageusia: Secondary | ICD-10-CM

## 2012-04-28 MED ORDER — SODIUM FLUORIDE 1.1 % DT CREA: TOPICAL_CREAM | DENTAL | Status: DC

## 2012-04-28 NOTE — Progress Notes (Signed)
Wednesday, Apr 28, 2012  BP:123/71                    P: 84             T: 97.6            Wgt: 137 lbs now and is stable. Patient and lost 5 pounds during treatments.  Tara Sloan is a 66 year old female with History of NHL of the Right Tonsil.  Patient underwent chemotherapy followed by radiation therapy. Patient now presents for a periodic oral examination after radiation therapy.   Patient completed radiation treatment from 03/22/12 thru 4/16/ 2013.  REVIEW OF CHIEF COMPLAINTS: DRY MOUTH: Yes, but not too bad. HARD TO SWALLOW: No HURT TO SWALLOW: Yes TASTE CHANGES: Taste is still gone. SORES IN MOUTH: No TRISMUS SYMPTOMS: Having no problems with trismus. SYMPTOM RELIEF:  Using salt water. HOME ORAL HYGIENE REGIMEN:  BRUSHING: Every time she eats. FLOSSING: 1- 2X a day RINSING: see above FLUORIDE: Doing fluoride at night having no problems TRISMUS EXERCISES: Maximim interincisal opening: 50 mm.   DENTAL EXAM: ORAL HYGIENE(PLAQUE): Good oral hygiene with minimal plaque noted. LOCATION OF MUCOSITIS: Right soft palate erythema. DESCRIPTION OF SALIVA: Decreased. Mild xerostomia. ANY EXPOSED BONE: None noted. OTHER WATCHED AREAS: None. DIAGNOSES: 1.  Xerostomia  2.  Dysgeusia 3.  Odynophagia 4.  Mucositis  RECOMMENDATIONS: 1. Brush after meals and at bedtime. Use fluoride at bedtime. Will call in Rx for Prevident 5000 Plus to [facility]. 2. Use trismus exercises as directed. 3. Use Biotene Rinse or salt water/baking soda rinses. 4. Multiple sips of water as needed. 5. Follow up with primary  Dentist-Dr. Breck Coons- as scheduled. Call if problems before then. Dr. Kristin Bruins

## 2012-05-06 ENCOUNTER — Ambulatory Visit (HOSPITAL_BASED_OUTPATIENT_CLINIC_OR_DEPARTMENT_OTHER): Payer: Medicare Other

## 2012-05-06 VITALS — BP 138/86 | HR 75 | Temp 97.6°F

## 2012-05-06 DIAGNOSIS — C8589 Other specified types of non-Hodgkin lymphoma, extranodal and solid organ sites: Secondary | ICD-10-CM

## 2012-05-06 DIAGNOSIS — C8590 Non-Hodgkin lymphoma, unspecified, unspecified site: Secondary | ICD-10-CM

## 2012-05-06 DIAGNOSIS — Z452 Encounter for adjustment and management of vascular access device: Secondary | ICD-10-CM

## 2012-05-06 MED ORDER — SODIUM CHLORIDE 0.9 % IJ SOLN
10.0000 mL | INTRAMUSCULAR | Status: DC | PRN
  Administered 2012-05-06: 10 mL via INTRAVENOUS
  Filled 2012-05-06: qty 10

## 2012-05-06 MED ORDER — HEPARIN SOD (PORK) LOCK FLUSH 100 UNIT/ML IV SOLN
500.0000 [IU] | Freq: Once | INTRAVENOUS | Status: AC
  Administered 2012-05-06: 500 [IU] via INTRAVENOUS
  Filled 2012-05-06: qty 5

## 2012-05-06 NOTE — Patient Instructions (Signed)
Call MD for problems 

## 2012-05-11 ENCOUNTER — Encounter: Payer: Self-pay | Admitting: Radiation Oncology

## 2012-05-12 ENCOUNTER — Ambulatory Visit
Admission: RE | Admit: 2012-05-12 | Discharge: 2012-05-12 | Disposition: A | Discharge status: Home / Self Care | Payer: Medicare Other | Source: Ambulatory Visit | Attending: Radiation Oncology | Admitting: Radiation Oncology

## 2012-05-12 ENCOUNTER — Encounter: Payer: Self-pay | Admitting: Radiation Oncology

## 2012-05-12 VITALS — BP 125/73 | HR 86 | Temp 97.0°F | Resp 20 | Wt 139.1 lb

## 2012-05-12 DIAGNOSIS — C8330 Diffuse large B-cell lymphoma, unspecified site: Secondary | ICD-10-CM

## 2012-05-12 NOTE — Progress Notes (Signed)
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 She is to do well although she still has some fatigue. Her taste is still altered and she does have slight xerostomia. She keeps up with her dentist and uses her fluoride trays. She sees Dr. Gaylyn Rong again for a followup visit in approximately one month. She denies B. Symptoms.  Physical examination: She looks well. She wears her weight. Nodes: Without palpable lymphadenopathy in the neck. Oral cavity and oropharynx unremarkable with the exception of mild xerostomia. No visible or palpable evidence for recurrent disease along the oropharynx. Indirect mirror examination confirmatory.  Impression: Satisfactory progress with no evidence for persistent/recurrent disease.  Plan: She'll see Dr. Gaylyn Rong in one month and I'll see her back for a followup visit in 3 months. I may release her at that time as long she is being followed closely by Dr. Gaylyn Rong.

## 2012-05-12 NOTE — Progress Notes (Signed)
Pt recovering from cold, completed course of Amoxicillin. Has lingering cough, congestion.  Denies pain, loss of appetite, slight dry mouth, no diff w/foods. Tastes still altered. Exercising jaw.

## 2012-06-02 NOTE — Patient Instructions (Addendum)
1. History of lymphoma:  No evidence of recurrent disease at this time. 2. Follow up:  CT in September 2013 and follow up visit the day after.

## 2012-06-03 ENCOUNTER — Other Ambulatory Visit (HOSPITAL_BASED_OUTPATIENT_CLINIC_OR_DEPARTMENT_OTHER): Payer: Medicare Other | Admitting: Lab

## 2012-06-03 ENCOUNTER — Ambulatory Visit: Payer: Medicare Other

## 2012-06-03 ENCOUNTER — Telehealth: Payer: Self-pay | Admitting: Oncology

## 2012-06-03 ENCOUNTER — Ambulatory Visit (HOSPITAL_BASED_OUTPATIENT_CLINIC_OR_DEPARTMENT_OTHER): Payer: Medicare Other | Admitting: Oncology

## 2012-06-03 VITALS — BP 145/82 | HR 79 | Temp 98.2°F | Ht 63.5 in | Wt 137.6 lb

## 2012-06-03 DIAGNOSIS — C8330 Diffuse large B-cell lymphoma, unspecified site: Secondary | ICD-10-CM

## 2012-06-03 DIAGNOSIS — M2550 Pain in unspecified joint: Secondary | ICD-10-CM

## 2012-06-03 DIAGNOSIS — C8590 Non-Hodgkin lymphoma, unspecified, unspecified site: Secondary | ICD-10-CM

## 2012-06-03 DIAGNOSIS — C8589 Other specified types of non-Hodgkin lymphoma, extranodal and solid organ sites: Secondary | ICD-10-CM

## 2012-06-03 LAB — CBC WITH DIFFERENTIAL/PLATELET
BASO%: 1 % (ref 0.0–2.0)
Basophils Absolute: 0.1 10*3/uL (ref 0.0–0.1)
EOS%: 3.8 % (ref 0.0–7.0)
Eosinophils Absolute: 0.2 10*3/uL (ref 0.0–0.5)
HCT: 38.6 % (ref 34.8–46.6)
HGB: 13.1 g/dL (ref 11.6–15.9)
LYMPH%: 19.7 % (ref 14.0–49.7)
MCH: 30.3 pg (ref 25.1–34.0)
MCHC: 33.9 g/dL (ref 31.5–36.0)
MCV: 89.5 fL (ref 79.5–101.0)
MONO#: 0.7 10*3/uL (ref 0.1–0.9)
MONO%: 12.9 % (ref 0.0–14.0)
NEUT#: 3.5 10*3/uL (ref 1.5–6.5)
NEUT%: 62.6 % (ref 38.4–76.8)
Platelets: 171 10*3/uL (ref 145–400)
RBC: 4.32 10*6/uL (ref 3.70–5.45)
RDW: 14.2 % (ref 11.2–14.5)
WBC: 5.5 10*3/uL (ref 3.9–10.3)
lymph#: 1.1 10*3/uL (ref 0.9–3.3)

## 2012-06-03 LAB — COMPREHENSIVE METABOLIC PANEL
ALT: 22 U/L (ref 0–35)
AST: 22 U/L (ref 0–37)
Albumin: 3.8 g/dL (ref 3.5–5.2)
Alkaline Phosphatase: 58 U/L (ref 39–117)
BUN: 22 mg/dL (ref 6–23)
CO2: 23 mEq/L (ref 19–32)
Calcium: 9.2 mg/dL (ref 8.4–10.5)
Chloride: 109 mEq/L (ref 96–112)
Creatinine, Ser: 1 mg/dL (ref 0.50–1.10)
Glucose, Bld: 110 mg/dL — ABNORMAL HIGH (ref 70–99)
Potassium: 4 mEq/L (ref 3.5–5.3)
Sodium: 142 mEq/L (ref 135–145)
Total Bilirubin: 0.3 mg/dL (ref 0.3–1.2)
Total Protein: 6.1 g/dL (ref 6.0–8.3)

## 2012-06-03 LAB — LACTATE DEHYDROGENASE: LDH: 143 U/L (ref 94–250)

## 2012-06-03 MED ORDER — HEPARIN SOD (PORK) LOCK FLUSH 100 UNIT/ML IV SOLN
500.0000 [IU] | Freq: Once | INTRAVENOUS | Status: AC
  Administered 2012-06-03: 500 [IU] via INTRAVENOUS
  Filled 2012-06-03: qty 5

## 2012-06-03 MED ORDER — SODIUM CHLORIDE 0.9 % IJ SOLN
10.0000 mL | INTRAMUSCULAR | Status: DC | PRN
  Administered 2012-06-03: 10 mL via INTRAVENOUS
  Filled 2012-06-03: qty 10

## 2012-06-03 NOTE — Progress Notes (Signed)
[facility]  [phone] [phone]   OFFICE PROGRESS NOTE   Cc:  [name], MD  DIAGNOSIS: Stage I Diffuse Large B-cell Lymphoma; IPSS of 1 given age; good prognosis.   PAST THERAPY: s/p 3 cycles of R-CHOP; last cycle in Feb 2013.  S/p consolidative radiation finished in April 2013.   CURRENT THERAPY: Watchful observation   INTERVAL HISTORY: Tara Sloan 66 y.o. female returns for regular follow up by herself. She reports mild xerostomia from radiation therapy. She has no problem with eating dried foods including breads and chickens. Her weight is relatively stable. Appetite is normal. She denies any palpable lymph node swelling. She denies fatigue, fever, mucositis, dysphagia, odynophagia, drenching night sweats.  She has hip and knee pain before diagnosis of lymphoma. While on chemotherapy this pain would be control because of steroid. Now the bone pain is coming back. However the pain is mild and does not interfere with her activities daily living. She does not require chronic pain medication.  Patient denies fatigue, headache, visual changes, confusion, drenching night sweats, palpable lymph node swelling, mucositis, odynophagia, dysphagia, nausea vomiting, jaundice, chest pain, palpitation, shortness of breath, dyspnea on exertion, productive cough, gum bleeding, epistaxis, hematemesis, hemoptysis, abdominal pain, abdominal swelling, early satiety, melena, hematochezia, hematuria, skin rash, spontaneous bleeding, joint swelling, heat or cold intolerance, bowel bladder incontinence, back pain, focal motor weakness, paresthesia, depression, suicidal or homocidal ideation, feeling hopelessness.   Past Medical History  Diagnosis Date  . HLD (hyperlipidemia)   . Osteopenia   . GERD (gastroesophageal reflux disease)   . Allergic rhinitis   . External hemorrhoid   . Arthritis     osteopenia  . Osteopenia   . Diffuse large B cell lymphoma  11/07/11    dx from tonsilar biopsy  . Cancer     nhl  . History of radiation therapy 03/22/12 - 04/09/12    right oropharynx/neck    Past Surgical History  Procedure Date  . Dilation and curettage of uterus 1987  . Partial hysterectomy 1988    fibroids  . Tubal ligation 1987  . Colonoscopy 5/06    Ext. hemms  . Breast biopsy 12/03    Negative  . Tonsillectomy 11/07/2011    Procedure: TONSILLECTOMY;  Surgeon: Drema Halon, MD;  Location:  S[facility];  Service: ENT;  Laterality: N/A;    Current Outpatient Prescriptions  Medication Sig Dispense Refill  . Calcium Carbonate-Vitamin D (CALCIUM 600/VITAMIN D) 600-400 MG-UNIT per tablet Take 1 tablet by mouth 2 (two) times daily.        . Ergocalciferol (VITAMIN D2) 2000 UNITS TABS Take 1 tablet by mouth daily.        . GuaiFENesin (MUCINEX PO) Take by mouth as directed.      . lidocaine-prilocaine (EMLA) cream Apply topically as needed.  30 g  0  . Multiple Vitamin (MULTIVITAMIN) tablet Take 1 tablet by mouth daily.        . naproxen sodium (ANAPROX) 220 MG tablet Take 220 mg by mouth 2 (two) times daily as needed. PAIN        . omeprazole (PRILOSEC) 20 MG capsule Take 1 capsule (20 mg total) by mouth daily as needed. HEART BURN  30 capsule  11  . simvastatin (ZOCOR) 40 MG tablet Take 1 tablet (40 mg total) by mouth at bedtime.  30 tablet  11  . sodium fluoride (PREVIDENT 5000 PLUS) 1.1 % CREA dental cream Apply thin ribbon  of cream to tooth brush. Brush teeth for 2 minutes. Spit out excess-DO NOT swallow. DO NOT rinse afterwards. Repeat nightly.  1 Tube  11  . trimethoprim-polymyxin b (POLYTRIM) ophthalmic solution       . acetaminophen (TYLENOL) 325 MG tablet Take 650 mg by mouth every 6 (six) hours as needed. PAIN       . Aspirin-Acetaminophen (GOODY BODY PAIN) 500-325 MG PACK Take 0.5 packets by mouth every 4 (four) hours as needed. PAIN       . ondansetron (ZOFRAN) 8 MG tablet Take 1 tablet two times a day  starting the day after chemo for 3 days. Then take 1 tablet two times a day as needed for nausea or vomiting.   30 tablet  1  . DISCONTD: prochlorperazine (COMPAZINE) 10 MG tablet Take 1 tablet (10 mg total) by mouth every 6 (six) hours as needed (Nausea or vomiting).  30 tablet  6    ALLERGIES:   has no known allergies.  REVIEW OF SYSTEMS:  The rest of the 14-point review of system was negative.   Filed Vitals:   06/03/12 0836  BP: 145/82  Pulse: 79  Temp: 98.2 F (36.8 C)   Wt Readings from Last 3 Encounters:  06/03/12 137 lb 9.6 oz (62.415 kg)  05/12/12 139 lb 1.6 oz (63.095 kg)  04/28/12 137 lb (62.143 kg)   ECOG Performance status: 0  PHYSICAL EXAMINATION:   General: well-nourished in no acute distress. Eyes: no scleral icterus. ENT: There were no oropharyngeal lesions. Neck was without thyromegaly. Lymphatics: Negative cervical, supraclavicular or axillary adenopathy. Respiratory: lungs were clear bilaterally without wheezing or crackles. Cardiovascular: Regular rate and rhythm, S1/S2, without murmur, rub or gallop. There was no pedal edema. GI: abdomen was soft, flat, nontender, nondistended, without organomegaly. Muscoloskeletal: no spinal tenderness of palpation of vertebral spine. Skin exam was without echymosis, petichae. Neuro exam was nonfocal. Patient was able to get on and off exam table without assistance. Gait was normal. Patient was alerted and oriented. Attention was good. Language was appropriate. Mood was normal without depression. Speech was not pressured. Thought content was not tangential.    LABORATORY/RADIOLOGY DATA:  Lab Results  Component Value Date   WBC 5.5 06/03/2012   HGB 13.1 06/03/2012   HCT 38.6 06/03/2012   PLT 171 06/03/2012   GLUCOSE 112 03/03/2012   CHOL 168 03/02/2012   TRIG 222.0* 03/02/2012   HDL 34.20* 03/02/2012   LDLDIRECT 92.2 03/02/2012   LDLCALC 106* 12/06/2010   ALKPHOS 71 03/03/2012   ALT 88* 02/11/2012   AST 50* 03/03/2012   NA 145  03/03/2012   K 3.8 03/03/2012   CL 99 03/03/2012   CREATININE 0.8 03/03/2012   BUN 19 03/03/2012   CO2 28 03/03/2012   HGBA1C 6.3 03/02/2012     ASSESSMENT AND PLAN:   1. History of large B cell, NHL of right tonsil; s/p excisional biopsy; IPSS 1; good risk.  She is s/p RCHOP and consolidative radiation. She had no residual disease. She is still in remission according to today's physical exam clinical history lab test. I recommended her to maintain her Port-A-Cath for at least 1-2 years. Will continue to flush the Port-A-Cath every 6-8 weeks.  2.  Vaccination: I recommended to PCP to delay the vaccination (shingle shot, DTAP) until at least September 2013 which will be about 6 months out from the finish of Rituxan-containing chemotherapy to ensure best response to vaccination.  3.  age-appropriate cancer screening: She  thinks that she is due for colonoscopy later this year. She is up-to-date with a mammogram per her report.   4. Slight transaminitis in the past: Most likely due to chemo. CT abdomen did not show abnormality. Today LFT is pending.  5.  diffuse mild arthralgia: Most likely due to osteoarthritis or DJD. I have low clinical suspicions for lymphoma spreading to her bone as her CBC is completely normal and she had baseline arthralgia before the diagnosis of lymphoma. I advised her to take over-the-counter analgesics when necessary.  6.  followup: CT of the neck, chest, abdomen, pelvis with contrast in about 3 months with clinic follow up the day after.     The length of time of the face-to-face encounter was 15 minutes. More than 50% of time was spent counseling and coordination of care.

## 2012-06-03 NOTE — Patient Instructions (Signed)
Call MD for problems 

## 2012-06-03 NOTE — Telephone Encounter (Signed)
appts made and printed for pt aom °

## 2012-07-06 IMAGING — XA IR FLUORO GUIDE CV LINE*R*
1 series · 2 of 2 positions shown · non-contrast
Comparison: none

CLINICAL DATA: 65-year-old female with lymphoma.

[Series 300: line placements · 2 of 2 slices shown]
[im 1/2]
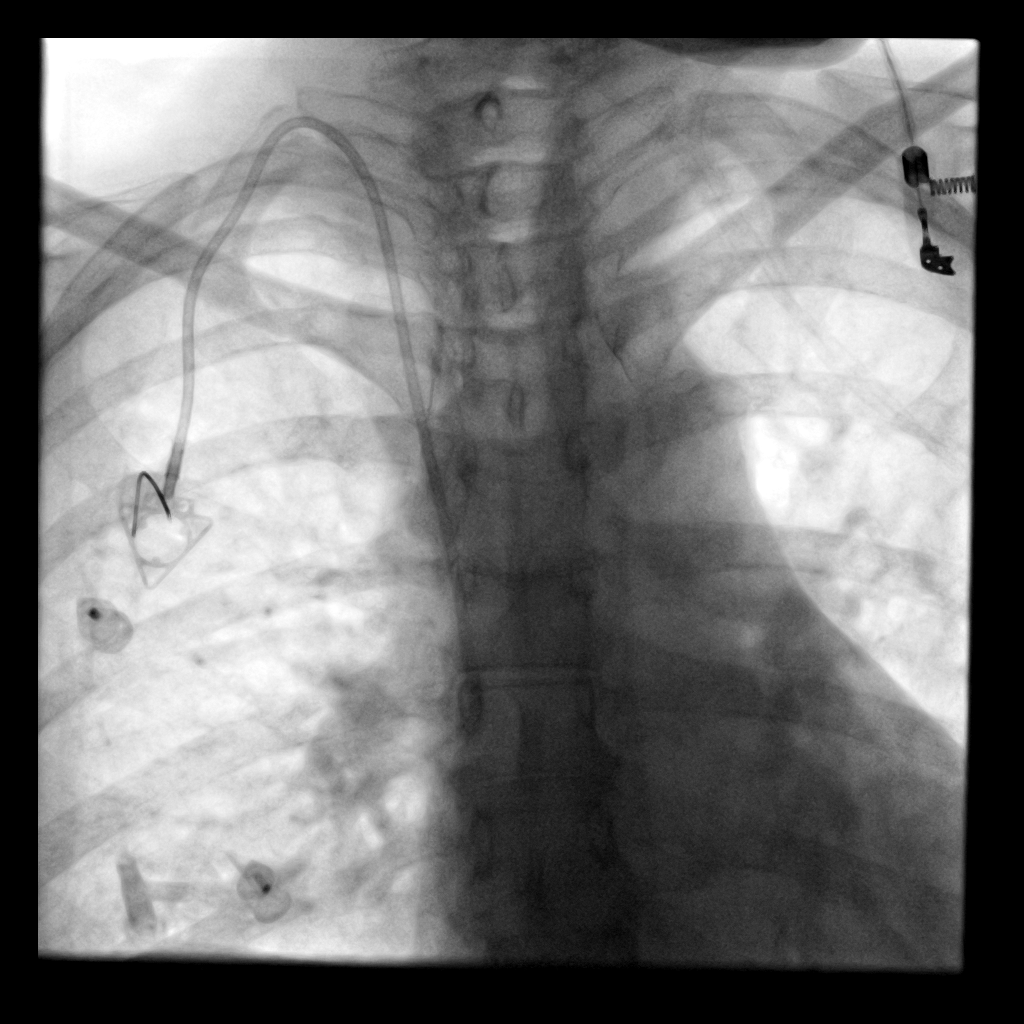
[im 2/2]
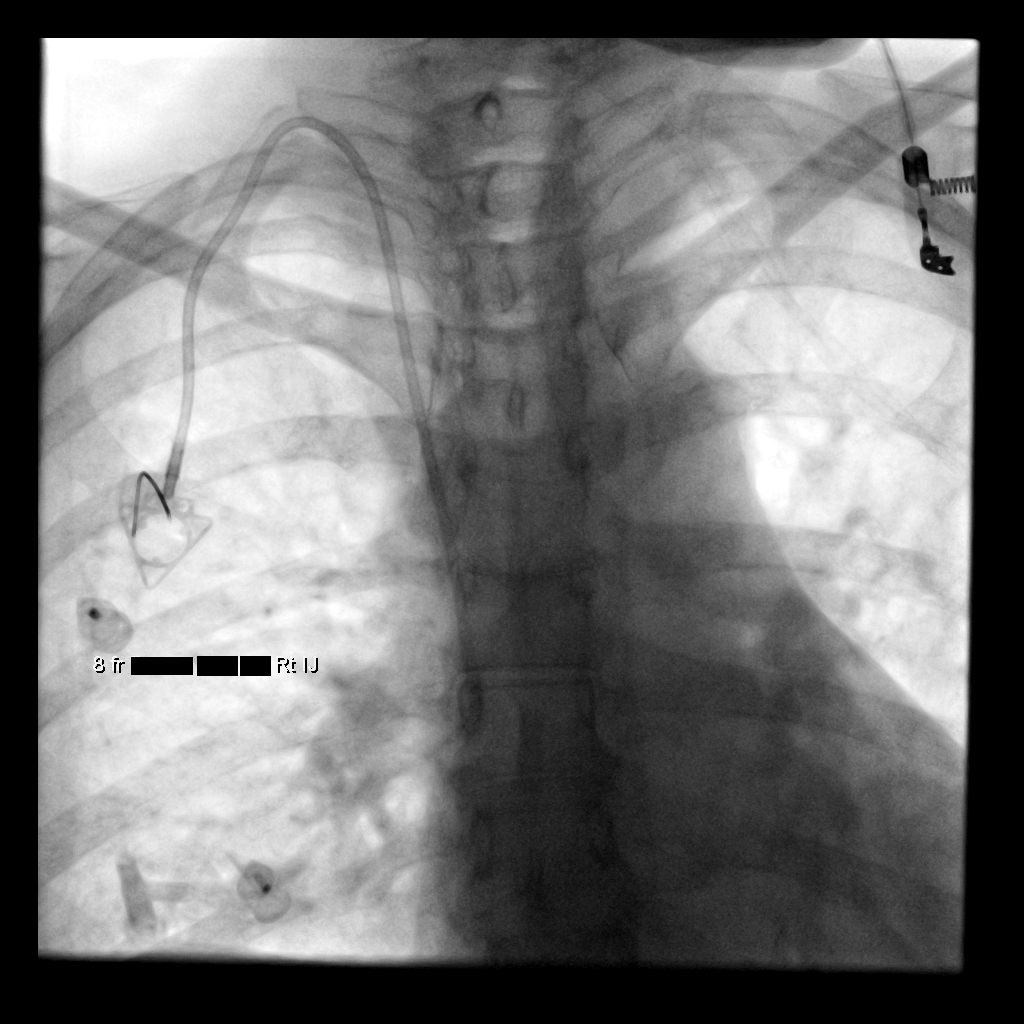

[2 of 2 positions shown; findings below may reference images not displayed]

FLUOROSCOPIC AND ULTRASOUND GUIDED PLACEMENT OF A SUBCUTANEOUS
PORT.

Medications:Versed 2 mg, Fentanyl 200 mcg. A radiology nurse
monitored the patient for moderate sedation.

As antibiotic prophylaxis, Ancef 1 gm was ordered pre-procedure and
administered intravenously within one hour of incision.

Moderate sedation time:50 minutes

Fluoroscopy time: 0.3 minutes

Procedure:  The risks of the procedure were explained to the
patient.  Informed consent was obtained.  Patient was placed supine
on the interventional table.  Ultrasound confirmed a patent right
internal jugular vein.  The right chest and neck were cleaned with
a skin antiseptic and a sterile drape was placed.  Maximal barrier
sterile technique was utilized including caps, mask, sterile gowns,
sterile gloves, sterile drape, hand hygiene and skin antiseptic.
The right neck was anesthetized with 1% lidocaine.  Small incision
was made in the right neck with a blade.  Micropuncture set was
placed in the right IJ with ultrasound guidance.  The micropuncture
wire was used for measurement purposes.  The right chest was
anesthetized with 1% lidocaine with epinephrine.  #15 blade was
used to make an incision and a subcutaneous port pocket was formed.
8 french Power Port was assembled.  Subcutaneous tunnel was formed
with a stiff tunneling device.  The port catheter was brought
through the subcutaneous tunnel.  The port was placed in the
subcutaneous pocket.  The micropuncture set was exchanged for a
peel-away sheath.  The catheter was placed through the peel-away
sheath and the tip was positioned in the lower SVC.  Catheter
placement was confirmed with fluoroscopy.  The port was accessed
and flushed with heparinized saline.  The port pocket was closed
using two layers of absorbable sutures and Dermabond.  The vein
skin site was closed using a single layer of absorbable suture and
Dermabond.  Sterile dressings were applied.  Patient tolerated the
procedure well without an immediate complication.  Ultrasound and
fluoroscopic images were taken and saved for this procedure.

Complications: None
IMPRESSION: Placement of a subcutaneous port device.  The catheter
tip is in the lower SVC and ready to be used.

## 2012-07-22 ENCOUNTER — Ambulatory Visit (HOSPITAL_BASED_OUTPATIENT_CLINIC_OR_DEPARTMENT_OTHER): Payer: Medicare Other

## 2012-07-22 VITALS — BP 123/73 | HR 85 | Temp 98.1°F

## 2012-07-22 DIAGNOSIS — Z452 Encounter for adjustment and management of vascular access device: Secondary | ICD-10-CM

## 2012-07-22 DIAGNOSIS — C8589 Other specified types of non-Hodgkin lymphoma, extranodal and solid organ sites: Secondary | ICD-10-CM

## 2012-07-22 DIAGNOSIS — C8590 Non-Hodgkin lymphoma, unspecified, unspecified site: Secondary | ICD-10-CM

## 2012-07-22 MED ORDER — HEPARIN SOD (PORK) LOCK FLUSH 100 UNIT/ML IV SOLN
500.0000 [IU] | Freq: Once | INTRAVENOUS | Status: AC
  Administered 2012-07-22: 500 [IU] via INTRAVENOUS
  Filled 2012-07-22: qty 5

## 2012-07-22 MED ORDER — SODIUM CHLORIDE 0.9 % IJ SOLN
10.0000 mL | INTRAMUSCULAR | Status: DC | PRN
  Administered 2012-07-22: 10 mL via INTRAVENOUS
  Filled 2012-07-22: qty 10

## 2012-07-22 NOTE — Patient Instructions (Signed)
Call MD for problems 

## 2012-08-03 ENCOUNTER — Other Ambulatory Visit: Payer: Self-pay | Admitting: Family Medicine

## 2012-08-03 DIAGNOSIS — Z1231 Encounter for screening mammogram for malignant neoplasm of breast: Secondary | ICD-10-CM

## 2012-08-18 ENCOUNTER — Encounter: Payer: Self-pay | Admitting: Radiation Oncology

## 2012-08-18 ENCOUNTER — Ambulatory Visit
Admission: RE | Admit: 2012-08-18 | Discharge: 2012-08-18 | Disposition: A | Discharge status: Home / Self Care | Payer: Medicare Other | Source: Ambulatory Visit | Attending: Radiation Oncology | Admitting: Radiation Oncology

## 2012-08-18 VITALS — BP 145/78 | HR 84 | Temp 97.6°F | Resp 20 | Wt 136.6 lb

## 2012-08-18 DIAGNOSIS — C8330 Diffuse large B-cell lymphoma, unspecified site: Secondary | ICD-10-CM

## 2012-08-18 NOTE — Progress Notes (Signed)
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 She feels well. Denies B. symptoms. No significant xerostomia in her taste has returned. She maintains her dental followup. She is scheduled for CT scans of the chest/neck on September 16 through Dr. Gaylyn Rong and then followup visit with him.  Physical examination: Alert and oriented. She has beautiful curly platinum blond hair.  Wt Readings from Last 3 Encounters:  08/18/12 136 lb 9.6 oz (61.961 kg)  06/03/12 137 lb 9.6 oz (62.415 kg)  05/12/12 139 lb 1.6 oz (63.095 kg)   Temp Readings from Last 3 Encounters:  08/18/12 97.6 F (36.4 C) Oral  07/22/12 98.1 F (36.7 C) Oral  06/03/12 98.2 F (36.8 C) Oral   BP Readings from Last 3 Encounters:  08/18/12 145/78  07/22/12 123/73  06/03/12 145/82   Pulse Readings from Last 3 Encounters:  08/18/12 84  07/22/12 85  06/03/12 79   Nodes: Without palpable lymphadenopathy in the neck. Oral cavity and oropharynx are unremarkable to inspection. On indirect mirror examination there is no visible evidence for recurrent lymphoma.  Laboratory data:  Lab Results  Component Value Date   WBC 5.5 06/03/2012   HGB 13.1 06/03/2012   HCT 38.6 06/03/2012   MCV 89.5 06/03/2012   PLT 171 06/03/2012    Impression: Satisfactory progress with no evidence for recurrent disease.  Plan: Followup through Dr. Gaylyn Rong. Peggye Form not scheduled the patient for a formal followup visit with me,  knowing that she'll be followed closely by Dr. Gaylyn Rong.  15 minutes was spent face-to-face with the patient, primarily counseling the patient.

## 2012-08-18 NOTE — Progress Notes (Signed)
Pt reports she is regaining taste buds on right side, does have mild-mod dry mouth. Denies fatigue, loss of appetite. She has hx arthritis pain, no other c/o pain.

## 2012-08-20 ENCOUNTER — Ambulatory Visit
Admission: RE | Admit: 2012-08-20 | Discharge: 2012-08-20 | Disposition: A | Discharge status: Home / Self Care | Payer: Medicare Other | Source: Ambulatory Visit | Attending: Family Medicine | Admitting: Family Medicine

## 2012-08-20 DIAGNOSIS — Z1231 Encounter for screening mammogram for malignant neoplasm of breast: Secondary | ICD-10-CM

## 2012-08-20 LAB — HM MAMMOGRAPHY: HM Mammogram: NORMAL

## 2012-08-24 ENCOUNTER — Encounter: Payer: Self-pay | Admitting: Family Medicine

## 2012-09-06 ENCOUNTER — Encounter (HOSPITAL_COMMUNITY): Payer: Self-pay

## 2012-09-06 ENCOUNTER — Ambulatory Visit (HOSPITAL_COMMUNITY)
Admission: RE | Admit: 2012-09-06 | Discharge: 2012-09-06 | Disposition: A | Discharge status: Home / Self Care | Payer: Medicare Other | Source: Ambulatory Visit | Attending: Oncology | Admitting: Oncology

## 2012-09-06 ENCOUNTER — Other Ambulatory Visit (HOSPITAL_BASED_OUTPATIENT_CLINIC_OR_DEPARTMENT_OTHER): Payer: Medicare Other | Admitting: Lab

## 2012-09-06 DIAGNOSIS — M47817 Spondylosis without myelopathy or radiculopathy, lumbosacral region: Secondary | ICD-10-CM | POA: Insufficient documentation

## 2012-09-06 DIAGNOSIS — I700 Atherosclerosis of aorta: Secondary | ICD-10-CM | POA: Insufficient documentation

## 2012-09-06 DIAGNOSIS — J984 Other disorders of lung: Secondary | ICD-10-CM | POA: Insufficient documentation

## 2012-09-06 DIAGNOSIS — I2510 Atherosclerotic heart disease of native coronary artery without angina pectoris: Secondary | ICD-10-CM | POA: Insufficient documentation

## 2012-09-06 DIAGNOSIS — Z923 Personal history of irradiation: Secondary | ICD-10-CM | POA: Insufficient documentation

## 2012-09-06 DIAGNOSIS — Z9221 Personal history of antineoplastic chemotherapy: Secondary | ICD-10-CM | POA: Insufficient documentation

## 2012-09-06 DIAGNOSIS — N281 Cyst of kidney, acquired: Secondary | ICD-10-CM | POA: Insufficient documentation

## 2012-09-06 DIAGNOSIS — C8589 Other specified types of non-Hodgkin lymphoma, extranodal and solid organ sites: Secondary | ICD-10-CM

## 2012-09-06 DIAGNOSIS — M5137 Other intervertebral disc degeneration, lumbosacral region: Secondary | ICD-10-CM | POA: Insufficient documentation

## 2012-09-06 DIAGNOSIS — Z90710 Acquired absence of both cervix and uterus: Secondary | ICD-10-CM | POA: Insufficient documentation

## 2012-09-06 DIAGNOSIS — M51379 Other intervertebral disc degeneration, lumbosacral region without mention of lumbar back pain or lower extremity pain: Secondary | ICD-10-CM | POA: Insufficient documentation

## 2012-09-06 DIAGNOSIS — N83339 Acquired atrophy of ovary and fallopian tube, unspecified side: Secondary | ICD-10-CM | POA: Insufficient documentation

## 2012-09-06 DIAGNOSIS — Q762 Congenital spondylolisthesis: Secondary | ICD-10-CM | POA: Insufficient documentation

## 2012-09-06 DIAGNOSIS — C8330 Diffuse large B-cell lymphoma, unspecified site: Secondary | ICD-10-CM

## 2012-09-06 LAB — COMPREHENSIVE METABOLIC PANEL (CC13)
ALT: 17 U/L (ref 0–55)
AST: 21 U/L (ref 5–34)
Albumin: 4 g/dL (ref 3.5–5.0)
Alkaline Phosphatase: 79 U/L (ref 40–150)
BUN: 21 mg/dL (ref 7.0–26.0)
CO2: 26 mEq/L (ref 22–29)
Calcium: 10 mg/dL (ref 8.4–10.4)
Chloride: 106 mEq/L (ref 98–107)
Creatinine: 0.9 mg/dL (ref 0.6–1.1)
Glucose: 97 mg/dl (ref 70–99)
Potassium: 4.1 mEq/L (ref 3.5–5.1)
Sodium: 142 mEq/L (ref 136–145)
Total Bilirubin: 0.5 mg/dL (ref 0.20–1.20)
Total Protein: 6.8 g/dL (ref 6.4–8.3)

## 2012-09-06 LAB — CBC WITH DIFFERENTIAL/PLATELET
BASO%: 0.8 % (ref 0.0–2.0)
Basophils Absolute: 0.1 10*3/uL (ref 0.0–0.1)
EOS%: 1.3 % (ref 0.0–7.0)
Eosinophils Absolute: 0.1 10*3/uL (ref 0.0–0.5)
HCT: 38.5 % (ref 34.8–46.6)
HGB: 13.1 g/dL (ref 11.6–15.9)
LYMPH%: 17.6 % (ref 14.0–49.7)
MCH: 30.4 pg (ref 25.1–34.0)
MCHC: 34 g/dL (ref 31.5–36.0)
MCV: 89.5 fL (ref 79.5–101.0)
MONO#: 0.6 10*3/uL (ref 0.1–0.9)
MONO%: 9.2 % (ref 0.0–14.0)
NEUT#: 4.9 10*3/uL (ref 1.5–6.5)
NEUT%: 71.1 % (ref 38.4–76.8)
Platelets: 163 10*3/uL (ref 145–400)
RBC: 4.3 10*6/uL (ref 3.70–5.45)
RDW: 14.8 % — ABNORMAL HIGH (ref 11.2–14.5)
WBC: 6.9 10*3/uL (ref 3.9–10.3)
lymph#: 1.2 10*3/uL (ref 0.9–3.3)

## 2012-09-06 LAB — LACTATE DEHYDROGENASE (CC13): LDH: 179 U/L (ref 125–220)

## 2012-09-06 MED ORDER — IOHEXOL 300 MG/ML  SOLN
125.0000 mL | Freq: Once | INTRAMUSCULAR | Status: AC | PRN
  Administered 2012-09-06: 125 mL via INTRAVENOUS

## 2012-09-08 ENCOUNTER — Telehealth: Payer: Self-pay | Admitting: Oncology

## 2012-09-08 ENCOUNTER — Ambulatory Visit (HOSPITAL_BASED_OUTPATIENT_CLINIC_OR_DEPARTMENT_OTHER): Payer: Medicare Other | Admitting: Oncology

## 2012-09-08 VITALS — BP 126/73 | HR 87 | Temp 97.7°F | Resp 20 | Ht 63.5 in | Wt 134.9 lb

## 2012-09-08 DIAGNOSIS — C8330 Diffuse large B-cell lymphoma, unspecified site: Secondary | ICD-10-CM

## 2012-09-08 DIAGNOSIS — C8581 Other specified types of non-Hodgkin lymphoma, lymph nodes of head, face, and neck: Secondary | ICD-10-CM

## 2012-09-08 NOTE — Telephone Encounter (Signed)
Called pt regarding flush appt until March 2014

## 2012-09-08 NOTE — Telephone Encounter (Signed)
Gave pt appt for December lab and Ml, then in March 2014 lab, Ct then MD

## 2012-09-08 NOTE — Patient Instructions (Addendum)
A.  CT result:   CT NECK  Findings: The nasopharynx is within normal limits, adenoid volume  is stable or mildly diminished. The tonsillar pillars appear  symmetric and within normal limits. The oropharynx and hypopharynx  are distended with gas as before. The epiglottis is within normal  limits. The larynx is within normal limits.  Next enhancing and hypodense nodule in the left thyroid lobe is  stable measuring up to 15 mm in size (CC).  Right IJ Port-A-Cath. The mediastinum and chest findings are  reported separately.  No cervical lymphadenopathy.  Retropharyngeal space, parapharyngeal spaces, sublingual space,  submandibular glands and parotid glands are within normal limits.  Visualized orbit soft tissues are within normal limits. Negative  visualized brain parenchyma. Major vascular structures in the neck  are patent. Mild atherosclerosis. Visualized paranasal sinuses and  mastoids are clear. Degenerative changes in the cervical spine.  Degenerative spinal stenosis at C4-C5. No acute osseous abnormality  identified.   IMPRESSION:  1. Stable CT appearance of the neck with no tonsillar mass or  lymphadenopathy.  2. Chest, abdomen and pelvis findings reported separately.  3. Stable left thyroid nodule, significance doubtful.  CT CHEST  Findings:  Mediastinum: Right IJ single lumen Port-A-Cath with tip terminating  at the superior cavoatrial junction. Heart size is normal. There is  no significant pericardial fluid, thickening or pericardial  calcification. There is atherosclerosis of the thoracic aorta, the  great vessels of the mediastinum and the coronary arteries,  including calcified atherosclerotic plaque in the left anterior  descending, left circumflex and right coronary arteries. No  pathologically enlarged mediastinal or hilar lymph nodes. The  esophagus is unremarkable in appearance.  Lungs/Pleura: Linear opacity in the left lower lobe is unchanged  and  compatible with an area of scarring. Linear opacities in the  right middle lobe to have increased compared to the prior  examination, favored to represent some evolving areas of scarring.  No acute consolidative airspace disease. No suspicious appearing  pulmonary nodules or masses are identified.  Musculoskeletal: There are no aggressive appearing lytic or blastic  lesions noted in the visualized portions of the skeleton.  IMPRESSION:  1. No evidence to suggest lymphomatous involvement in the thorax.  2. Scar in the left lower lobe is unchanged. Evolving scarring in  the right middle lobe, increased compared to the prior examination,  presumably related to prior infection or inflammation.   CT ABDOMEN AND PELVIS   Findings:  Abdomen/Pelvis: The enhanced appearance of the liver, gallbladder,  pancreas, spleen, bilateral adrenal glands and the left kidney is  unremarkable. 14 mm low attenuation lesion in the periphery of the  interpolar region of the right kidney is compatible with a cyst.  No ascites or pneumoperitoneum and no pathologic distension of  bowel. No definite pathologic lymphadenopathy identified within  the abdomen or pelvis. Atherosclerosis of the abdominal and pelvic  vasculature, without evidence of aneurysm or dissection. Status  post hysterectomy. Bilateral ovaries are atrophic. Urinary  bladder is unremarkable in appearance.  Musculoskeletal: There are no aggressive appearing lytic or blastic  lesions noted in the visualized portions of the skeleton.  Degenerative disc disease is noted, most severe at L3 - L4-L4 - L5.  There is 8 mm of anterolisthesis of L4 upon L5 and 7 mm of  anterolisthesis of L3 upon L4.   IMPRESSION:   1. No evidence of lymphomatous involvement in the abdomen or  pelvis.  2. 1.4 cm right renal cyst incidentally noted.  3. Atherosclerosis.  4. Status post hysterectomy.  5. Multilevel degenerative disc disease and lumbar spondylosis,    most severe at L3-L4 and L4-L5 where there is anterolisthesis of  both of these levels (7 mm of L3 on L4, and 8 mm of L4 on L5), as  above.  B.  Follow up:  With lab/ Belenda Cruise in about 3 months.   CT again in 6 months and see me the day after.

## 2012-09-08 NOTE — Progress Notes (Signed)
[facility]  Telephone:([phone]) [phone] Fax:(336) [phone]   OFFICE PROGRESS NOTE   Cc:  [name], MD  DIAGNOSIS: Stage I Diffuse Large B-cell Lymphoma; IPSS of 1 given age; good prognosis.   PAST THERAPY: s/p 3 cycles of R-CHOP; last cycle in Feb 2013. S/p consolidative radiation finished in April 2013.   CURRENT THERAPY: Watchful observation  INTERVAL HISTORY: Tara Sloan 66 y.o. female returns for regular follow up by herself.  She reports doing well except for nasal drainage with the allergy and change in weather.   Patient denies fever, anorexia, weight loss, fatigue, headache, visual changes, confusion, drenching night sweats, palpable lymph node swelling, mucositis, odynophagia, dysphagia, nausea vomiting, jaundice, chest pain, palpitation, shortness of breath, dyspnea on exertion, productive cough, gum bleeding, epistaxis, hematemesis, hemoptysis, abdominal pain, abdominal swelling, early satiety, melena, hematochezia, hematuria, skin rash, spontaneous bleeding, joint swelling, joint pain, heat or cold intolerance, bowel bladder incontinence, back pain, focal motor weakness, paresthesia, depression, suicidal or homicidal ideation, feeling hopelessness.   Past Medical History  Diagnosis Date  . HLD (hyperlipidemia)   . Osteopenia   . GERD (gastroesophageal reflux disease)   . Allergic rhinitis   . External hemorrhoid   . Arthritis     osteopenia  . Osteopenia   . History of radiation therapy 03/22/12 - 04/09/12    right oropharynx/neck  . 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 Cancer     nhl    Past Surgical History  Procedure Date  . Dilation and curettage of uterus 1987  . Partial hysterectomy 1988    fibroids  . Tubal ligation 1987  . Colonoscopy 5/06    Ext. hemms  . Breast biopsy 12/03    Negative  . Tonsillectomy 11/07/2011    Procedure: TONSILLECTOMY;  Surgeon: Drema Halon, MD;  Location: [facility];  Service: ENT;  Laterality: N/A;    Current Outpatient Prescriptions  Medication Sig Dispense Refill  . acetaminophen (TYLENOL) 325 MG tablet Take 650 mg by mouth every 6 (six) hours as needed. PAIN       . Aspirin-Acetaminophen (GOODY BODY PAIN) 500-325 MG PACK Take 0.5 packets by mouth every 4 (four) hours as needed. PAIN       . Calcium Carbonate-Vitamin D (CALCIUM 600/VITAMIN D) 600-400 MG-UNIT per tablet Take 1 tablet by mouth 2 (two) times daily.        . Ergocalciferol (VITAMIN D2) 2000 UNITS TABS Take 1 tablet by mouth daily.        . GuaiFENesin (MUCINEX PO) Take by mouth as directed.      . lidocaine-prilocaine (EMLA) cream Apply topically as needed.  30 g  0  . Multiple Vitamin (MULTIVITAMIN) tablet Take 1 tablet by mouth daily.        . naproxen sodium (ANAPROX) 220 MG tablet Take 220 mg by mouth 2 (two) times daily as needed. PAIN        . omeprazole (PRILOSEC) 20 MG capsule Take 1 capsule (20 mg total) by mouth daily as needed. HEART BURN  30 capsule  11  . simvastatin (ZOCOR) 40 MG tablet Take 1 tablet (40 mg total) by mouth at bedtime.  30 tablet  11  . sodium fluoride (PREVIDENT 5000 PLUS) 1.1 % CREA dental cream Apply thin ribbon of cream to tooth brush. Brush teeth for 2 minutes. Spit out excess-DO NOT swallow. DO NOT rinse afterwards. Repeat nightly.  1  Tube  11  . DISCONTD: prochlorperazine (COMPAZINE) 10 MG tablet Take 1 tablet (10 mg total) by mouth every 6 (six) hours as needed (Nausea or vomiting).  30 tablet  6    ALLERGIES:   has no known allergies.  REVIEW OF SYSTEMS:  The rest of the 14-point review of system was negative.   Filed Vitals:   09/08/12 1439  BP: 126/73  Pulse: 87  Temp: 97.7 F (36.5 C)  Resp: 20   Wt Readings from Last 3 Encounters:  09/08/12 134 lb 14.4 oz (61.19 kg)  08/18/12 136 lb 9.6 oz (61.961 kg)  06/03/12 137 lb 9.6 oz (62.415 kg)   ECOG Performance status: 0  PHYSICAL EXAMINATION:   General:   well-nourished woman, in no acute distress.  Eyes:  no scleral icterus.  ENT:  There were no oropharyngeal lesions.  Neck was without thyromegaly.  Lymphatics:  Negative cervical, supraclavicular or axillary adenopathy.  Respiratory: lungs were clear bilaterally without wheezing or crackles.  Cardiovascular:  Regular rate and rhythm, S1/S2, without murmur, rub or gallop.  There was no pedal edema.  GI:  abdomen was soft, flat, nontender, nondistended, without organomegaly.  Muscoloskeletal:  no spinal tenderness of palpation of vertebral spine.  Skin exam was without echymosis, petichae.  Neuro exam was nonfocal.  Patient was able to get on and off exam table without assistance.  Gait was normal.  Patient was alerted and oriented.  Attention was good.   Language was appropriate.  Mood was normal without depression.  Speech was not pressured.  Thought content was not tangential.     LABORATORY/RADIOLOGY DATA:  Lab Results  Component Value Date   WBC 6.9 09/06/2012   HGB 13.1 09/06/2012   HCT 38.5 09/06/2012   PLT 163 09/06/2012   GLUCOSE 97 09/06/2012   CHOL 168 03/02/2012   TRIG 222.0* 03/02/2012   HDL 34.20* 03/02/2012   LDLDIRECT 92.2 03/02/2012   LDLCALC 106* 12/06/2010   ALKPHOS 79 09/06/2012   ALT 17 09/06/2012   AST 21 09/06/2012   NA 142 09/06/2012   K 4.1 09/06/2012   CL 106 09/06/2012   CREATININE 0.9 09/06/2012   BUN 21.0 09/06/2012   CO2 26 09/06/2012   HGBA1C 6.3 03/02/2012   IMAGING:  I personally reviewed the following CT and showed the images to the patient.   09/06/2012 Ct Soft Tissue Neck W Contrast   Findings: The nasopharynx is within normal limits, adenoid volume is stable or mildly diminished.  The tonsillar pillars appear symmetric and within normal limits.  The oropharynx and hypopharynx are distended with gas as before.  The epiglottis is within normal limits.  The larynx is within normal limits.  Next enhancing and hypodense nodule in the left thyroid lobe is stable measuring up  to 15 mm in size (CC).  Right IJ Port-A-Cath.  The mediastinum and chest findings are reported separately.  No cervical lymphadenopathy.  Retropharyngeal space, parapharyngeal spaces, sublingual space, submandibular glands and parotid glands are within normal limits. Visualized orbit soft tissues are within normal limits.  Negative visualized brain parenchyma.  Major vascular structures in the neck are patent.  Mild atherosclerosis. Visualized paranasal sinuses and mastoids are clear.  Degenerative changes in the cervical spine. Degenerative spinal stenosis at C4-C5. No acute osseous abnormality identified.    IMPRESSION:   1.  Stable CT appearance of the neck with no tonsillar mass or lymphadenopathy. 2.  Chest, abdomen and pelvis findings reported separately. 3.  Stable left  thyroid nodule, significance doubtful.   Original Report Authenticated By: Harley Hallmark, M.D.       CT CHEST  Findings:  Mediastinum: Right IJ single lumen Port-A-Cath with tip terminating at the superior cavoatrial junction. Heart size is normal. There is no significant pericardial fluid, thickening or pericardial calcification. There is atherosclerosis of the thoracic aorta, the great vessels of the mediastinum and the coronary arteries, including calcified atherosclerotic plaque in the left anterior descending, left circumflex and right coronary arteries. No pathologically enlarged mediastinal or hilar lymph nodes. The esophagus is unremarkable in appearance.  Lungs/Pleura: Linear opacity in the left lower lobe is unchanged and compatible with an area of scarring.  Linear opacities in the right middle lobe to have increased compared to the prior examination, favored to represent some evolving areas of scarring. No acute consolidative airspace disease.  No suspicious appearing pulmonary nodules or masses are identified.  Musculoskeletal: There are no aggressive appearing lytic or blastic lesions noted in the visualized portions of  the skeleton.    IMPRESSION:    1.  No evidence to suggest lymphomatous involvement in the thorax. 2.  Scar in the left lower lobe is unchanged.  Evolving scarring in the right middle lobe, increased compared to the prior examination, presumably related to prior infection or inflammation.    CT ABDOMEN AND PELVIS  Findings:  Abdomen/Pelvis: The enhanced appearance of the liver, gallbladder, pancreas, spleen, bilateral adrenal glands and the left kidney is unremarkable.  14 mm low attenuation lesion in the periphery of the interpolar region of the right kidney is compatible with a cyst. No ascites or pneumoperitoneum and no pathologic distension of bowel.  No definite pathologic lymphadenopathy identified within the abdomen or pelvis.  Atherosclerosis of the abdominal and pelvic vasculature, without evidence of aneurysm or dissection.  Status post hysterectomy.  Bilateral ovaries are atrophic.  Urinary bladder is unremarkable in appearance.  Musculoskeletal: There are no aggressive appearing lytic or blastic lesions noted in the visualized portions of the skeleton. Degenerative disc disease is noted, most severe at L3 - L4-L4 - L5. There is 8 mm of anterolisthesis of L4 upon L5 and 7 mm of anterolisthesis of L3 upon L4.    IMPRESSION:    1.  No evidence of lymphomatous involvement in the abdomen or pelvis. 2.  1.4 cm right renal cyst incidentally noted. 3.  Atherosclerosis. 4.  Status post hysterectomy. 5.  Multilevel degenerative disc disease and lumbar spondylosis, most severe at L3-L4 and L4-L5 where there is anterolisthesis of both of these levels (7 mm of L3 on L4, and 8 mm of L4 on L5), as above.   Original Report Authenticated By: Florencia Reasons, M.D.      Mm Digital Screening  08/20/2012  *RADIOLOGY REPORT*  Clinical Data: Screening.  DIGITAL BILATERAL SCREENING MAMMOGRAM WITH CAD  Comparison:  Previous exams.  Findings: The breast tissue is extremely dense. No suspicious masses,  architectural distortion, or calcifications are present.  Images were processed with CAD.  IMPRESSION: No mammographic evidence of malignancy.  A result letter of this screening mammogram will be mailed directly to the patient.  RECOMMENDATION: Screening mammogram in one year. (Code:SM-B-01Y)  BI-RADS CATEGORY 2:  Benign finding(s).   Original Report Authenticated By: Sherian Rein, M.D.        ASSESSMENT AND PLAN:   1. History of large B cell, NHL of right tonsil; s/p excisional biopsy; IPSS 1; good risk.  She is s/p RCHOP and consolidative radiation. She  had no recurrent disease.  She is in remission.    2. Vaccination: OK to resume vaccination per PCP at this time.   3. Age-appropriate cancer screening: She thinks that she is due for colonoscopy in 2014.  She is up-to-date with a mammogram with normal report.   4. Slight transaminitis in the past: Most likely due to chemo. CT abdomen did not show abnormality. Today, LFT was normal.  5. Followup:  Return to clinic in about 3 months with Belenda Cruise NP.  CT of the neck, chest, abdomen, pelvis with contrast in about 6 months with follow up with me the day after.  6.  I assisted patient to enroll to Mychart today.     The length of time of the face-to-face encounter was 15 minutes. More than 50% of time was spent counseling and coordination of care.

## 2012-09-13 ENCOUNTER — Other Ambulatory Visit: Payer: Self-pay | Admitting: Dermatology

## 2012-09-15 ENCOUNTER — Ambulatory Visit (INDEPENDENT_AMBULATORY_CARE_PROVIDER_SITE_OTHER): Payer: Medicare Other | Admitting: Family Medicine

## 2012-09-15 ENCOUNTER — Encounter: Payer: Self-pay | Admitting: Family Medicine

## 2012-09-15 VITALS — BP 114/60 | HR 68 | Temp 98.0°F | Ht 63.5 in | Wt 135.8 lb

## 2012-09-15 DIAGNOSIS — Z23 Encounter for immunization: Secondary | ICD-10-CM

## 2012-09-15 DIAGNOSIS — R7309 Other abnormal glucose: Secondary | ICD-10-CM

## 2012-09-15 DIAGNOSIS — E785 Hyperlipidemia, unspecified: Secondary | ICD-10-CM

## 2012-09-15 DIAGNOSIS — R739 Hyperglycemia, unspecified: Secondary | ICD-10-CM

## 2012-09-15 LAB — LIPID PANEL
Cholesterol: 166 mg/dL (ref 0–200)
HDL: 40.5 mg/dL (ref >39.00)
LDL Cholesterol: 98 mg/dL (ref 0–99)
Total CHOL/HDL Ratio: 4
Triglycerides: 137 mg/dL (ref 0.0–149.0)
VLDL: 27.4 mg/dL (ref 0.0–40.0)

## 2012-09-15 LAB — HEMOGLOBIN A1C: Hgb A1c MFr Bld: 5.8 % (ref 4.6–6.5)

## 2012-09-15 NOTE — Assessment & Plan Note (Signed)
Check labs today Expect imp in HDL with more exercise  Rev low sat fat diet  F/u 6 mo

## 2012-09-15 NOTE — Assessment & Plan Note (Signed)
a1c today Diet better - low glycemic Wt is great  Exercise is great  F/u 6 mo

## 2012-09-15 NOTE — Patient Instructions (Addendum)
You had flu shot today  Call in 1 month to tell me to send a shingles vaccine to [facility] When you see your oncologist in 6 months ask if you should have a Td or a Tdap Labs today for cholesterol and sugar Follow up about 6 months for annual exam with labs prior

## 2012-09-15 NOTE — Progress Notes (Signed)
Subjective:    Patient ID: Tara Sloan, female    DOB: 1946-06-03, 67 y.o.   MRN: [id number]  HPI Is feeling good and doing well   Is in remission from lymphoma - cbc is normal Hair is coming back and she is doing very well  Will f/u at 6 months   Can have the shingles vaccine - after sept   Flu shot today- got that   Wt is stable  bp is good   Lab Results  Component Value Date   CHOL 168 03/02/2012   HDL 34.20* 03/02/2012   LDLCALC 106* 12/06/2010   LDLDIRECT 92.2 03/02/2012   TRIG 222.0* 03/02/2012   CHOLHDL 5 03/02/2012   wanted to get good chol up  She started back in curves - since June, goes 5 days per week  Is eating a fairly healthy diet   Hyperglycemia Lab Results  Component Value Date   HGBA1C 6.3 03/02/2012    Patient Active Problem List  Diagnosis  . HYPERLIPIDEMIA  . ALLERGIC RHINITIS  . GERD  . OSTEOPENIA  . ALLERGY  . ARTHRITIS, HANDS, BILATERAL  . Strep throat  . Diffuse large B cell lymphoma  . Screening for lipoid disorders  . Diabetes mellitus screening  . Hyperglycemia   Past Medical History  Diagnosis Date  . HLD (hyperlipidemia)   . Osteopenia   . GERD (gastroesophageal reflux disease)   . Allergic rhinitis   . External hemorrhoid   . Arthritis     osteopenia  . Osteopenia   . History of radiation therapy 03/22/12 - 04/09/12    right oropharynx/neck  . 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 Cancer     nhl   Past Surgical History  Procedure Date  . Dilation and curettage of uterus 1987  . Partial hysterectomy 1988    fibroids  . Tubal ligation 1987  . Colonoscopy 5/06    Ext. hemms  . Breast biopsy 12/03    Negative  . Tonsillectomy 11/07/2011    Procedure: TONSILLECTOMY;  Surgeon: Drema Halon, MD;  Location: [facility];  Service: ENT;  Laterality: N/A;   History  Substance Use Topics  . Smoking status: Former Smoker -- 5 years    Quit date: 11/04/1974  .  Smokeless tobacco: Never Used  . Alcohol Use: Yes     Rare   Family History  Problem Relation Age of Onset  . Heart attack Father 66  . Heart disease Father   . Stroke Mother 36  . Breast cancer Paternal Aunt   . Cancer Paternal Aunt   . Hyperlipidemia      Family history  . Cancer Paternal Aunt    No Known Allergies Current Outpatient Prescriptions on File Prior to Visit  Medication Sig Dispense Refill  . acetaminophen (TYLENOL) 325 MG tablet Take 650 mg by mouth every 6 (six) hours as needed. PAIN       . Aspirin-Acetaminophen (GOODY BODY PAIN) 500-325 MG PACK Take 0.5 packets by mouth every 4 (four) hours as needed. PAIN       . Calcium Carbonate-Vitamin D (CALCIUM 600/VITAMIN D) 600-400 MG-UNIT per tablet Take 1 tablet by mouth 2 (two) times daily.        . Ergocalciferol (VITAMIN D2) 2000 UNITS TABS Take 1 tablet by mouth daily.        . GuaiFENesin (MUCINEX PO) Take by mouth as directed.      Marland Kitchen  lidocaine-prilocaine (EMLA) cream Apply topically as needed.  30 g  0  . Multiple Vitamin (MULTIVITAMIN) tablet Take 1 tablet by mouth daily.        . naproxen sodium (ANAPROX) 220 MG tablet Take 220 mg by mouth 2 (two) times daily as needed. PAIN        . omeprazole (PRILOSEC) 20 MG capsule Take 1 capsule (20 mg total) by mouth daily as needed. HEART BURN  30 capsule  11  . simvastatin (ZOCOR) 40 MG tablet Take 1 tablet (40 mg total) by mouth at bedtime.  30 tablet  11  . sodium fluoride (PREVIDENT 5000 PLUS) 1.1 % CREA dental cream Apply thin ribbon of cream to tooth brush. Brush teeth for 2 minutes. Spit out excess-DO NOT swallow. DO NOT rinse afterwards. Repeat nightly.  1 Tube  11  . DISCONTD: prochlorperazine (COMPAZINE) 10 MG tablet Take 1 tablet (10 mg total) by mouth every 6 (six) hours as needed (Nausea or vomiting).  30 tablet  6      Review of Systems    Review of Systems  Constitutional: Negative for fever, appetite change, fatigue and unexpected weight change.  Eyes:  Negative for pain and visual disturbance.  Respiratory: Negative for cough and shortness of breath.   Cardiovascular: Negative for cp or palpitations    Gastrointestinal: Negative for nausea, diarrhea and constipation.  Genitourinary: Negative for urgency and frequency.  Skin: Negative for pallor or rash   Neurological: Negative for weakness, light-headedness, numbness and headaches.  Hematological: Negative for adenopathy. Does not bruise/bleed easily.  Psychiatric/Behavioral: Negative for dysphoric mood. The patient is not nervous/anxious.      Objective:   Physical Exam  Constitutional: She appears well-developed and well-nourished. No distress.  HENT:  Head: Normocephalic and atraumatic.  Mouth/Throat: Oropharynx is clear and moist. No oropharyngeal exudate.  Eyes: Conjunctivae normal and EOM are normal. Pupils are equal, round, and reactive to light. Right eye exhibits no discharge. Left eye exhibits no discharge.  Neck: Normal range of motion. Neck supple. No JVD present. Carotid bruit is not present. No thyromegaly present.  Cardiovascular: Normal rate, regular rhythm, normal heart sounds and intact distal pulses.  Exam reveals no gallop.   Pulmonary/Chest: Effort normal and breath sounds normal. No respiratory distress. She has no wheezes.  Abdominal: Soft. Bowel sounds are normal. She exhibits no distension, no abdominal bruit and no mass. There is no tenderness.  Musculoskeletal: Normal range of motion. She exhibits no edema and no tenderness.  Lymphadenopathy:    She has no cervical adenopathy.  Neurological: She is alert. She has normal reflexes. No cranial nerve deficit. She exhibits normal muscle tone. Coordination normal.  Skin: Skin is warm and dry. No rash noted. No erythema. No pallor.  Psychiatric: She has a normal mood and affect.          Assessment & Plan:

## 2012-10-13 ENCOUNTER — Telehealth: Payer: Self-pay

## 2012-10-13 MED ORDER — ZOSTER VACCINE LIVE 19400 UNT/0.65ML ~~LOC~~ SOLR: 0.6500 mL | Freq: Once | SUBCUTANEOUS | Status: DC

## 2012-10-13 NOTE — Telephone Encounter (Signed)
I sent it to the pharmacy  Have her let us know what day she gets it so we can add to her health mt list- thanks

## 2012-10-13 NOTE — Telephone Encounter (Signed)
Pt was seen 09/15/12; pt was to call back in one month to request Dr Milinda Antis to send rx for shingles vaccine to [facility] advise.

## 2012-10-13 NOTE — Telephone Encounter (Signed)
Notified pt vaccine was sent to [facility] and call us when she gets the vaccine so we can update her chart

## 2012-10-20 ENCOUNTER — Ambulatory Visit (HOSPITAL_BASED_OUTPATIENT_CLINIC_OR_DEPARTMENT_OTHER): Payer: Self-pay

## 2012-10-20 VITALS — BP 134/80 | HR 76 | Temp 98.4°F

## 2012-10-20 DIAGNOSIS — Z452 Encounter for adjustment and management of vascular access device: Secondary | ICD-10-CM

## 2012-10-20 DIAGNOSIS — C8590 Non-Hodgkin lymphoma, unspecified, unspecified site: Secondary | ICD-10-CM

## 2012-10-20 DIAGNOSIS — C8589 Other specified types of non-Hodgkin lymphoma, extranodal and solid organ sites: Secondary | ICD-10-CM

## 2012-10-20 MED ORDER — SODIUM CHLORIDE 0.9 % IJ SOLN
10.0000 mL | INTRAMUSCULAR | Status: DC | PRN
  Administered 2012-10-20: 10 mL via INTRAVENOUS
  Filled 2012-10-20: qty 10

## 2012-10-20 MED ORDER — HEPARIN SOD (PORK) LOCK FLUSH 100 UNIT/ML IV SOLN
500.0000 [IU] | Freq: Once | INTRAVENOUS | Status: AC
  Administered 2012-10-20: 500 [IU] via INTRAVENOUS
  Filled 2012-10-20: qty 5

## 2012-12-08 ENCOUNTER — Ambulatory Visit (HOSPITAL_BASED_OUTPATIENT_CLINIC_OR_DEPARTMENT_OTHER): Payer: Medicare Other | Admitting: Oncology

## 2012-12-08 ENCOUNTER — Encounter: Payer: Self-pay | Admitting: Oncology

## 2012-12-08 ENCOUNTER — Ambulatory Visit: Payer: Medicare Other

## 2012-12-08 ENCOUNTER — Other Ambulatory Visit (HOSPITAL_BASED_OUTPATIENT_CLINIC_OR_DEPARTMENT_OTHER): Payer: Medicare Other | Admitting: Lab

## 2012-12-08 VITALS — BP 138/77 | HR 85 | Temp 98.5°F | Resp 18 | Ht 63.5 in | Wt 134.0 lb

## 2012-12-08 DIAGNOSIS — C8581 Other specified types of non-Hodgkin lymphoma, lymph nodes of head, face, and neck: Secondary | ICD-10-CM

## 2012-12-08 DIAGNOSIS — C8330 Diffuse large B-cell lymphoma, unspecified site: Secondary | ICD-10-CM

## 2012-12-08 DIAGNOSIS — C8590 Non-Hodgkin lymphoma, unspecified, unspecified site: Secondary | ICD-10-CM

## 2012-12-08 LAB — COMPREHENSIVE METABOLIC PANEL (CC13)
ALT: 24 U/L (ref 0–55)
AST: 27 U/L (ref 5–34)
Albumin: 3.9 g/dL (ref 3.5–5.0)
Alkaline Phosphatase: 65 U/L (ref 40–150)
BUN: 20 mg/dL (ref 7.0–26.0)
CO2: 28 mEq/L (ref 22–29)
Calcium: 9.3 mg/dL (ref 8.4–10.4)
Chloride: 104 mEq/L (ref 98–107)
Creatinine: 0.9 mg/dL (ref 0.6–1.1)
Glucose: 93 mg/dl (ref 70–99)
Potassium: 4.3 mEq/L (ref 3.5–5.1)
Sodium: 145 mEq/L (ref 136–145)
Total Bilirubin: 0.53 mg/dL (ref 0.20–1.20)
Total Protein: 6.7 g/dL (ref 6.4–8.3)

## 2012-12-08 LAB — CBC WITH DIFFERENTIAL/PLATELET
BASO%: 0.4 % (ref 0.0–2.0)
Basophils Absolute: 0 10*3/uL (ref 0.0–0.1)
EOS%: 0.5 % (ref 0.0–7.0)
Eosinophils Absolute: 0.1 10*3/uL (ref 0.0–0.5)
HCT: 39.7 % (ref 34.8–46.6)
HGB: 13.5 g/dL (ref 11.6–15.9)
LYMPH%: 13.6 % — ABNORMAL LOW (ref 14.0–49.7)
MCH: 30.5 pg (ref 25.1–34.0)
MCHC: 34 g/dL (ref 31.5–36.0)
MCV: 89.7 fL (ref 79.5–101.0)
MONO#: 1 10*3/uL — ABNORMAL HIGH (ref 0.1–0.9)
MONO%: 9.5 % (ref 0.0–14.0)
NEUT#: 8.2 10*3/uL — ABNORMAL HIGH (ref 1.5–6.5)
NEUT%: 76 % (ref 38.4–76.8)
Platelets: 176 10*3/uL (ref 145–400)
RBC: 4.43 10*6/uL (ref 3.70–5.45)
RDW: 14 % (ref 11.2–14.5)
WBC: 10.7 10*3/uL — ABNORMAL HIGH (ref 3.9–10.3)
lymph#: 1.5 10*3/uL (ref 0.9–3.3)

## 2012-12-08 LAB — LACTATE DEHYDROGENASE (CC13): LDH: 198 U/L (ref 125–245)

## 2012-12-08 MED ORDER — HEPARIN SOD (PORK) LOCK FLUSH 100 UNIT/ML IV SOLN
500.0000 [IU] | Freq: Once | INTRAVENOUS | Status: AC
  Administered 2012-12-08: 500 [IU] via INTRAVENOUS
  Filled 2012-12-08: qty 5

## 2012-12-08 MED ORDER — SODIUM CHLORIDE 0.9 % IJ SOLN
10.0000 mL | INTRAMUSCULAR | Status: DC | PRN
  Administered 2012-12-08: 10 mL via INTRAVENOUS
  Filled 2012-12-08: qty 10

## 2012-12-08 NOTE — Progress Notes (Signed)
[facility]  Telephone:([phone] [phone]   OFFICE PROGRESS NOTE   Cc:  [name], MD  DIAGNOSIS: Stage I Diffuse Large B-cell Lymphoma; IPSS of 1 given age; good prognosis.   PAST THERAPY: s/p 3 cycles of R-CHOP; last cycle in Feb 2013. S/p consolidative radiation finished in April 2013.   CURRENT THERAPY: Watchful observation  INTERVAL HISTORY: Tara Sloan 66 y.o. female returns for regular follow up by herself.  She is staying active.  Patient denies fever, anorexia, weight loss, fatigue, headache, visual changes, confusion, drenching night sweats, palpable lymph node swelling, mucositis, odynophagia, dysphagia, nausea vomiting, jaundice, chest pain, palpitation, shortness of breath, dyspnea on exertion, productive cough, gum bleeding, epistaxis, hematemesis, hemoptysis, abdominal pain, abdominal swelling, early satiety, melena, hematochezia, hematuria, skin rash, spontaneous bleeding, joint swelling, joint pain, heat or cold intolerance, bowel bladder incontinence, back pain, focal motor weakness, paresthesia, depression, suicidal or homicidal ideation, feeling hopelessness.   Past Medical History  Diagnosis Date  . HLD (hyperlipidemia)   . Osteopenia   . GERD (gastroesophageal reflux disease)   . Allergic rhinitis   . External hemorrhoid   . Arthritis     osteopenia  . Osteopenia   . History of radiation therapy 03/22/12 - 04/09/12    right oropharynx/neck  . 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 Cancer     nhl    Past Surgical History  Procedure Date  . Dilation and curettage of uterus 1987  . Partial hysterectomy 1988    fibroids  . Tubal ligation 1987  . Colonoscopy 5/06    Ext. hemms  . Breast biopsy 12/03    Negative  . Tonsillectomy 11/07/2011    Procedure: TONSILLECTOMY;  Surgeon: Drema Halon, MD;  Location: [facility];  Service: ENT;  Laterality: N/A;     Current Outpatient Prescriptions  Medication Sig Dispense Refill  . acetaminophen (TYLENOL) 325 MG tablet Take 650 mg by mouth every 6 (six) hours as needed. PAIN       . Aspirin-Acetaminophen (GOODY BODY PAIN) 500-325 MG PACK Take 0.5 packets by mouth every 4 (four) hours as needed. PAIN       . Calcium Carbonate-Vitamin D (CALCIUM 600/VITAMIN D) 600-400 MG-UNIT per tablet Take 1 tablet by mouth 2 (two) times daily.        . Ergocalciferol (VITAMIN D2) 2000 UNITS TABS Take 1 tablet by mouth daily.        . GuaiFENesin (MUCINEX PO) Take by mouth as directed.      . lidocaine-prilocaine (EMLA) cream Apply topically as needed.  30 g  0  . Multiple Vitamin (MULTIVITAMIN) tablet Take 1 tablet by mouth daily.        . naproxen sodium (ANAPROX) 220 MG tablet Take 220 mg by mouth 2 (two) times daily as needed. PAIN        . omeprazole (PRILOSEC) 20 MG capsule Take 1 capsule (20 mg total) by mouth daily as needed. HEART BURN  30 capsule  11  . simvastatin (ZOCOR) 40 MG tablet Take 1 tablet (40 mg total) by mouth at bedtime.  30 tablet  11  . sodium fluoride (PREVIDENT 5000 PLUS) 1.1 % CREA dental cream Apply thin ribbon of cream to tooth brush. Brush teeth for 2 minutes. Spit out excess-DO NOT swallow. DO NOT rinse afterwards. Repeat nightly.  1 Tube  11  . zoster vaccine live, PF, (ZOSTAVAX) 45409 UNT/0.65ML  injection Inject 19,400 Units into the skin once.  1 vial  0  . [DISCONTINUED] prochlorperazine (COMPAZINE) 10 MG tablet Take 1 tablet (10 mg total) by mouth every 6 (six) hours as needed (Nausea or vomiting).  30 tablet  6   No current facility-administered medications for this visit.   Facility-Administered Medications Ordered in Other Visits  Medication Dose Route Frequency Provider Last Rate Last Dose  . sodium chloride 0.9 % injection 10 mL  10 mL Intravenous PRN Exie Parody, MD   10 mL at 12/08/12 1119    ALLERGIES:   has no known allergies.  REVIEW OF SYSTEMS:  The rest of the  14-point review of system was negative.   Filed Vitals:   12/08/12 1045  BP: 138/77  Pulse: 85  Temp: 98.5 F (36.9 C)  Resp: 18   Wt Readings from Last 3 Encounters:  12/08/12 134 lb (60.782 kg)  09/15/12 135 lb 12 oz (61.576 kg)  09/08/12 134 lb 14.4 oz (61.19 kg)   ECOG Performance status: 0  PHYSICAL EXAMINATION:   General:  well-nourished woman, in no acute distress.  Eyes:  no scleral icterus.  ENT:  There were no oropharyngeal lesions.  Neck was without thyromegaly.  Lymphatics:  Negative cervical, supraclavicular or axillary adenopathy.  Respiratory: lungs were clear bilaterally without wheezing or crackles.  Cardiovascular:  Regular rate and rhythm, S1/S2, without murmur, rub or gallop.  There was no pedal edema.  GI:  abdomen was soft, flat, nontender, nondistended, without organomegaly.  Muscoloskeletal:  no spinal tenderness of palpation of vertebral spine.  Skin exam was without echymosis, petichae.  Neuro exam was nonfocal.  Patient was able to get on and off exam table without assistance.  Gait was normal.  Patient was alerted and oriented.  Attention was good.   Language was appropriate.  Mood was normal without depression.  Speech was not pressured.  Thought content was not tangential.     LABORATORY/RADIOLOGY DATA:  Lab Results  Component Value Date   WBC 10.7* 12/08/2012   HGB 13.5 12/08/2012   HCT 39.7 12/08/2012   PLT 176 12/08/2012   GLUCOSE 93 12/08/2012   CHOL 166 09/15/2012   TRIG 137.0 09/15/2012   HDL 40.50 09/15/2012   LDLDIRECT 92.2 03/02/2012   LDLCALC 98 09/15/2012   ALKPHOS 65 12/08/2012   ALT 24 12/08/2012   AST 27 12/08/2012   NA 145 12/08/2012   K 4.3 12/08/2012   CL 104 12/08/2012   CREATININE 0.9 12/08/2012   BUN 20.0 12/08/2012   CO2 28 12/08/2012   HGBA1C 5.8 09/15/2012    ASSESSMENT AND PLAN:   1. History of large B cell, NHL of right tonsil; s/p excisional biopsy; IPSS 1; good risk.  She is s/p RCHOP and consolidative radiation.  She had no recurrent disease.  She is in remission.    2. Vaccination: OK to resume vaccination per PCP at this time.   3. Age-appropriate cancer screening: She thinks that she is due for colonoscopy in 2014.  She is up-to-date with a mammogram with normal report.   4. Slight transaminitis in the past: Most likely due to chemo. CT abdomen did not show abnormality. Today, LFT was normal.  5. Followup:  Return to clinic in about 3 months with a CT of the neck, chest, abdomen, pelvis with contrast prior to the visit.     The length of time of the face-to-face encounter was 15 minutes. More than 50% of time was  spent counseling and coordination of care.

## 2013-01-12 ENCOUNTER — Ambulatory Visit (HOSPITAL_BASED_OUTPATIENT_CLINIC_OR_DEPARTMENT_OTHER): Payer: Medicare Other

## 2013-01-12 VITALS — BP 142/83 | HR 78 | Temp 97.9°F

## 2013-01-12 DIAGNOSIS — C8589 Other specified types of non-Hodgkin lymphoma, extranodal and solid organ sites: Secondary | ICD-10-CM

## 2013-01-12 DIAGNOSIS — C8590 Non-Hodgkin lymphoma, unspecified, unspecified site: Secondary | ICD-10-CM

## 2013-01-12 DIAGNOSIS — Z452 Encounter for adjustment and management of vascular access device: Secondary | ICD-10-CM

## 2013-01-12 MED ORDER — HEPARIN SOD (PORK) LOCK FLUSH 100 UNIT/ML IV SOLN
500.0000 [IU] | Freq: Once | INTRAVENOUS | Status: AC
  Administered 2013-01-12: 500 [IU] via INTRAVENOUS
  Filled 2013-01-12: qty 5

## 2013-01-12 MED ORDER — SODIUM CHLORIDE 0.9 % IJ SOLN
10.0000 mL | INTRAMUSCULAR | Status: DC | PRN
  Administered 2013-01-12: 10 mL via INTRAVENOUS
  Filled 2013-01-12: qty 10

## 2013-01-12 NOTE — Patient Instructions (Signed)
Call MD for problems or concerns 

## 2013-02-23 ENCOUNTER — Ambulatory Visit (HOSPITAL_BASED_OUTPATIENT_CLINIC_OR_DEPARTMENT_OTHER): Payer: Medicare Other

## 2013-02-23 VITALS — BP 137/84 | HR 68 | Temp 97.9°F

## 2013-02-23 DIAGNOSIS — C8590 Non-Hodgkin lymphoma, unspecified, unspecified site: Secondary | ICD-10-CM

## 2013-02-23 DIAGNOSIS — C8589 Other specified types of non-Hodgkin lymphoma, extranodal and solid organ sites: Secondary | ICD-10-CM

## 2013-02-23 DIAGNOSIS — Z452 Encounter for adjustment and management of vascular access device: Secondary | ICD-10-CM

## 2013-02-23 MED ORDER — HEPARIN SOD (PORK) LOCK FLUSH 100 UNIT/ML IV SOLN
500.0000 [IU] | Freq: Once | INTRAVENOUS | Status: AC
  Administered 2013-02-23: 500 [IU] via INTRAVENOUS
  Filled 2013-02-23: qty 5

## 2013-02-23 MED ORDER — SODIUM CHLORIDE 0.9 % IJ SOLN
10.0000 mL | INTRAMUSCULAR | Status: DC | PRN
  Administered 2013-02-23: 10 mL via INTRAVENOUS
  Filled 2013-02-23: qty 10

## 2013-03-08 ENCOUNTER — Ambulatory Visit (HOSPITAL_COMMUNITY)
Admission: RE | Admit: 2013-03-08 | Discharge: 2013-03-08 | Disposition: A | Discharge status: Home / Self Care | Payer: Medicare Other | Source: Ambulatory Visit | Attending: Oncology | Admitting: Oncology

## 2013-03-08 ENCOUNTER — Other Ambulatory Visit (HOSPITAL_BASED_OUTPATIENT_CLINIC_OR_DEPARTMENT_OTHER): Payer: Medicare Other | Admitting: Lab

## 2013-03-08 DIAGNOSIS — I2510 Atherosclerotic heart disease of native coronary artery without angina pectoris: Secondary | ICD-10-CM | POA: Insufficient documentation

## 2013-03-08 DIAGNOSIS — C8330 Diffuse large B-cell lymphoma, unspecified site: Secondary | ICD-10-CM

## 2013-03-08 DIAGNOSIS — Z9221 Personal history of antineoplastic chemotherapy: Secondary | ICD-10-CM | POA: Insufficient documentation

## 2013-03-08 DIAGNOSIS — C8589 Other specified types of non-Hodgkin lymphoma, extranodal and solid organ sites: Secondary | ICD-10-CM | POA: Insufficient documentation

## 2013-03-08 DIAGNOSIS — I359 Nonrheumatic aortic valve disorder, unspecified: Secondary | ICD-10-CM | POA: Insufficient documentation

## 2013-03-08 DIAGNOSIS — I6529 Occlusion and stenosis of unspecified carotid artery: Secondary | ICD-10-CM | POA: Insufficient documentation

## 2013-03-08 DIAGNOSIS — IMO0002 Reserved for concepts with insufficient information to code with codable children: Secondary | ICD-10-CM | POA: Insufficient documentation

## 2013-03-08 DIAGNOSIS — Z90710 Acquired absence of both cervix and uterus: Secondary | ICD-10-CM | POA: Insufficient documentation

## 2013-03-08 DIAGNOSIS — I700 Atherosclerosis of aorta: Secondary | ICD-10-CM | POA: Insufficient documentation

## 2013-03-08 DIAGNOSIS — N281 Cyst of kidney, acquired: Secondary | ICD-10-CM | POA: Insufficient documentation

## 2013-03-08 DIAGNOSIS — Q762 Congenital spondylolisthesis: Secondary | ICD-10-CM | POA: Insufficient documentation

## 2013-03-08 DIAGNOSIS — M129 Arthropathy, unspecified: Secondary | ICD-10-CM | POA: Insufficient documentation

## 2013-03-08 DIAGNOSIS — I658 Occlusion and stenosis of other precerebral arteries: Secondary | ICD-10-CM | POA: Insufficient documentation

## 2013-03-08 DIAGNOSIS — E042 Nontoxic multinodular goiter: Secondary | ICD-10-CM | POA: Insufficient documentation

## 2013-03-08 DIAGNOSIS — M47817 Spondylosis without myelopathy or radiculopathy, lumbosacral region: Secondary | ICD-10-CM | POA: Insufficient documentation

## 2013-03-08 LAB — CBC WITH DIFFERENTIAL/PLATELET
BASO%: 0.4 % (ref 0.0–2.0)
Basophils Absolute: 0 10*3/uL (ref 0.0–0.1)
EOS%: 1.2 % (ref 0.0–7.0)
Eosinophils Absolute: 0.1 10*3/uL (ref 0.0–0.5)
HCT: 41.7 % (ref 34.8–46.6)
HGB: 13.9 g/dL (ref 11.6–15.9)
LYMPH%: 20.4 % (ref 14.0–49.7)
MCH: 29.8 pg (ref 25.1–34.0)
MCHC: 33.3 g/dL (ref 31.5–36.0)
MCV: 89.3 fL (ref 79.5–101.0)
MONO#: 0.6 10*3/uL (ref 0.1–0.9)
MONO%: 7.8 % (ref 0.0–14.0)
NEUT#: 5.7 10*3/uL (ref 1.5–6.5)
NEUT%: 70.2 % (ref 38.4–76.8)
Platelets: 182 10*3/uL (ref 145–400)
RBC: 4.67 10*6/uL (ref 3.70–5.45)
RDW: 13.8 % (ref 11.2–14.5)
WBC: 8.1 10*3/uL (ref 3.9–10.3)
lymph#: 1.7 10*3/uL (ref 0.9–3.3)
nRBC: 0 % (ref 0–0)

## 2013-03-08 LAB — COMPREHENSIVE METABOLIC PANEL (CC13)
ALT: 19 U/L (ref 0–55)
AST: 23 U/L (ref 5–34)
Albumin: 4 g/dL (ref 3.5–5.0)
Alkaline Phosphatase: 65 U/L (ref 40–150)
BUN: 18.9 mg/dL (ref 7.0–26.0)
CO2: 25 mEq/L (ref 22–29)
Calcium: 9.9 mg/dL (ref 8.4–10.4)
Chloride: 105 mEq/L (ref 98–107)
Creatinine: 1 mg/dL (ref 0.6–1.1)
Glucose: 108 mg/dl — ABNORMAL HIGH (ref 70–99)
Potassium: 3.7 mEq/L (ref 3.5–5.1)
Sodium: 142 mEq/L (ref 136–145)
Total Bilirubin: 0.7 mg/dL (ref 0.20–1.20)
Total Protein: 7.2 g/dL (ref 6.4–8.3)

## 2013-03-08 LAB — LACTATE DEHYDROGENASE (CC13): LDH: 185 U/L (ref 125–245)

## 2013-03-08 MED ORDER — IOHEXOL 300 MG/ML  SOLN
100.0000 mL | Freq: Once | INTRAMUSCULAR | Status: AC | PRN
  Administered 2013-03-08: 100 mL via INTRAVENOUS

## 2013-03-09 ENCOUNTER — Ambulatory Visit (HOSPITAL_BASED_OUTPATIENT_CLINIC_OR_DEPARTMENT_OTHER): Payer: Medicare Other | Admitting: Oncology

## 2013-03-09 ENCOUNTER — Telehealth: Payer: Self-pay | Admitting: Oncology

## 2013-03-09 ENCOUNTER — Telehealth: Payer: Self-pay | Admitting: Family Medicine

## 2013-03-09 VITALS — BP 121/72 | HR 79 | Temp 98.0°F | Resp 20 | Ht 63.5 in | Wt 137.5 lb

## 2013-03-09 DIAGNOSIS — C8330 Diffuse large B-cell lymphoma, unspecified site: Secondary | ICD-10-CM

## 2013-03-09 DIAGNOSIS — M899 Disorder of bone, unspecified: Secondary | ICD-10-CM

## 2013-03-09 DIAGNOSIS — K219 Gastro-esophageal reflux disease without esophagitis: Secondary | ICD-10-CM

## 2013-03-09 DIAGNOSIS — E785 Hyperlipidemia, unspecified: Secondary | ICD-10-CM

## 2013-03-09 DIAGNOSIS — R739 Hyperglycemia, unspecified: Secondary | ICD-10-CM

## 2013-03-09 DIAGNOSIS — C8589 Other specified types of non-Hodgkin lymphoma, extranodal and solid organ sites: Secondary | ICD-10-CM

## 2013-03-09 NOTE — Telephone Encounter (Signed)
Message copied by Judy Pimple on Wed Mar 09, 2013 10:41 PM ------      Message from: Alvina Chou      Created: Fri Mar 04, 2013 10:18 AM      Regarding: Lab orders for Thursday, 3.20.14       Patient is scheduled for CPX labs, please order future labs, Thanks , Terri       ------

## 2013-03-09 NOTE — Telephone Encounter (Signed)
gv and printed appt schedule for pt for Sept °

## 2013-03-09 NOTE — Progress Notes (Signed)
[facility]  Telephone:(336) [phone] Fax:([phone]) [phone]   OFFICE PROGRESS NOTE   Cc:  [name], MD  DIAGNOSIS: Stage I Diffuse Large B-cell Lymphoma; IPSS of 1 given age; good prognosis.   PAST THERAPY: s/p 3 cycles of R-CHOP; last cycle in Feb 2013. S/p consolidative radiation finished in April 2013.   CURRENT THERAPY: Watchful observation  INTERVAL HISTORY: Tara Sloan 67 y.o. female returns for regular follow up by herself.  She reports doing well.  She has chronic joint pain in the feet, hip that predated the lymphoma diagnosis.  Pain is worse in the morning but improves as the day progresses.  Patient denies fever, anorexia, weight loss, fatigue, headache, visual changes, confusion, drenching night sweats, palpable lymph node swelling, mucositis, odynophagia, dysphagia, nausea vomiting, jaundice, chest pain, palpitation, shortness of breath, dyspnea on exertion, productive cough, gum bleeding, epistaxis, hematemesis, hemoptysis, abdominal pain, abdominal swelling, early satiety, melena, hematochezia, hematuria, skin rash, spontaneous bleeding, joint swelling, joint pain, heat or cold intolerance, bowel bladder incontinence, back pain, focal motor weakness, paresthesia, depression.     Past Medical History  Diagnosis Date  . HLD (hyperlipidemia)   . Osteopenia   . GERD (gastroesophageal reflux disease)   . Allergic rhinitis   . External hemorrhoid   . Arthritis     osteopenia  . Osteopenia   . History of radiation therapy 03/22/12 - 04/09/12    right oropharynx/neck  . 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 Cancer     nhl    Past Surgical History  Procedure Laterality Date  . Dilation and curettage of uterus  1987  . Partial hysterectomy  1988    fibroids  . Tubal ligation  1987  . Colonoscopy  5/06    Ext. hemms  . Breast biopsy  12/03    Negative  . Tonsillectomy  11/07/2011    Procedure: TONSILLECTOMY;   Surgeon: Drema Halon, MD;  Location: [facility];  Service: ENT;  Laterality: N/A;    Current Outpatient Prescriptions  Medication Sig Dispense Refill  . acetaminophen (TYLENOL) 325 MG tablet Take 650 mg by mouth every 6 (six) hours as needed. PAIN       . Aspirin-Acetaminophen (GOODY BODY PAIN) 500-325 MG PACK Take 0.5 packets by mouth every 4 (four) hours as needed. PAIN       . Calcium Carbonate-Vitamin D (CALCIUM 600/VITAMIN D) 600-400 MG-UNIT per tablet Take 1 tablet by mouth 2 (two) times daily.        . Ergocalciferol (VITAMIN D2) 2000 UNITS TABS Take 1 tablet by mouth daily.        . GuaiFENesin (MUCINEX PO) Take by mouth as directed.      . Multiple Vitamin (MULTIVITAMIN) tablet Take 1 tablet by mouth daily.        . naproxen sodium (ANAPROX) 220 MG tablet Take 220 mg by mouth 2 (two) times daily as needed. PAIN        . omeprazole (PRILOSEC) 20 MG capsule Take 1 capsule (20 mg total) by mouth daily as needed. HEART BURN  30 capsule  11  . simvastatin (ZOCOR) 40 MG tablet Take 1 tablet (40 mg total) by mouth at bedtime.  30 tablet  11  . sodium fluoride (PREVIDENT 5000 PLUS) 1.1 % CREA dental cream Apply thin ribbon of cream to tooth brush. Brush teeth for 2 minutes. Spit out excess-DO NOT swallow. DO  NOT rinse afterwards. Repeat nightly.  1 Tube  11  . [DISCONTINUED] prochlorperazine (COMPAZINE) 10 MG tablet Take 1 tablet (10 mg total) by mouth every 6 (six) hours as needed (Nausea or vomiting).  30 tablet  6   No current facility-administered medications for this visit.    ALLERGIES:  has No Known Allergies.  REVIEW OF SYSTEMS:  The rest of the 14-point review of system was negative.   Filed Vitals:   03/09/13 1004  BP: 121/72  Pulse: 79  Temp: 98 F (36.7 C)  Resp: 20   Wt Readings from Last 3 Encounters:  03/09/13 137 lb 8 oz (62.37 kg)  12/08/12 134 lb (60.782 kg)  09/15/12 135 lb 12 oz (61.576 kg)   ECOG Performance status: 0  PHYSICAL  EXAMINATION:   General:  well-nourished woman, in no acute distress.  Eyes:  no scleral icterus.  ENT:  There were no oropharyngeal lesions.  Neck was without thyromegaly.  Lymphatics:  Negative cervical, supraclavicular or axillary adenopathy.  Respiratory: lungs were clear bilaterally without wheezing or crackles.  Cardiovascular:  Regular rate and rhythm, S1/S2, without murmur, rub or gallop.  There was no pedal edema.  GI:  abdomen was soft, flat, nontender, nondistended, without organomegaly.  Muscoloskeletal:  no spinal tenderness of palpation of vertebral spine.  Skin exam was without echymosis, petichae.  Neuro exam was nonfocal.  Patient was able to get on and off exam table without assistance.  Gait was normal.  Patient was alerted and oriented.  Attention was good.   Language was appropriate.  Mood was normal without depression.  Speech was not pressured.  Thought content was not tangential.     LABORATORY/RADIOLOGY DATA:  Lab Results  Component Value Date   WBC 8.1 03/08/2013   HGB 13.9 03/08/2013   HCT 41.7 03/08/2013   PLT 182 03/08/2013   GLUCOSE 108* 03/08/2013   CHOL 166 09/15/2012   TRIG 137.0 09/15/2012   HDL 40.50 09/15/2012   LDLDIRECT 92.2 03/02/2012   LDLCALC 98 09/15/2012   ALKPHOS 65 03/08/2013   ALT 19 03/08/2013   AST 23 03/08/2013   NA 142 03/08/2013   K 3.7 03/08/2013   CL 105 03/08/2013   CREATININE 1.0 03/08/2013   BUN 18.9 03/08/2013   CO2 25 03/08/2013   HGBA1C 5.8 09/15/2012   IMAGING:  I personally reviewed the following CT and showed the images to the patient.    CT CHEST  Findings:  Mediastinum: Heart size is normal. There is no significant  pericardial fluid, thickening or pericardial calcification. There  is atherosclerosis of the thoracic aorta, the great vessels of the  mediastinum and the coronary arteries, including calcified  atherosclerotic plaque in the left main, left anterior descending,  left circumflex and right coronary arteries. No  pathologically  enlarged mediastinal or hilar lymph nodes. Calcifications of the  aortic valve. Right internal jugular single lumen Port-A-Cath with  tip terminating in the superior cavoatrial junction. Esophagus is  unremarkable in appearance.  Lungs/Pleura: Linear opacities in the lateral aspect of the left  lower lobe and lateral segment of the right middle lobe are  unchanged, and most compatible with areas of chronic scarring. No  suspicious appearing pulmonary nodules or masses are identified.  No acute consolidative air space disease. No pleural effusions.  Musculoskeletal: There are no aggressive appearing lytic or blastic  lesions noted in the visualized portions of the skeleton.  IMPRESSION:  1. No findings to suggest lymphoma recurrence in the  thorax.  2. Atherosclerosis, including left main and three-vessel coronary  artery disease. Please note that although the presence of coronary  artery calcium documents the presence of coronary artery disease,  the severity of this disease and any potential stenosis cannot be  assessed on this non-gated CT examination. Assessment for  potential risk factor modification, dietary therapy or  pharmacologic therapy may be warranted, if clinically indicated.  3. Additional incidental findings, as above, similar to prior  examination.  CT ABDOMEN AND PELVIS  Findings:  Abdomen/Pelvis: The appearance of the liver, gallbladder, pancreas,  spleen, bilateral adrenal glands and the left kidney is  unremarkable. 1.4 cm low attenuation exophytic lesion in the  interpolar region of the right kidney is unchanged, compatible with  a small simple cyst. Atherosclerosis of the abdominal and pelvic  vasculature, without definite aneurysm or dissection. No  significant volume of ascites. No pneumoperitoneum. No pathologic  distension of small bowel. Status post hysterectomy. Ovaries are  not confidently identified and may be surgically absent or    atrophic. The urinary bladder is unremarkable in appearance. No  definite pathologic lymphadenopathy identified within the abdomen  or pelvis.  Musculoskeletal: Severe multilevel degenerative disc disease and  facet arthropathy is again noted, including 8 mm of anterolisthesis  of L4 upon L5 and 7 mm of anterolisthesis of L3 upon L4  (unchanged). There are no aggressive appearing lytic or blastic  lesions noted in the visualized portions of the skeleton.  IMPRESSION:  1. No findings to suggest lymphoma recurrence in the abdomen or  pelvis.  2. 1.4 cm simple cyst in the interpolar region of the right kidney  is unchanged.  3. Atherosclerosis.  4. Multilevel degenerative disc disease and lumbar spondylosis  redemonstrated, as above.    CT NECK WITH CONTRAST  Technique: Multidetector CT imaging of the neck was performed with  intravenous contrast.  Contrast: OMNIPAQUE IOHEXOL 300 MG/ML SOLN  In conjunction with CT(s) of the chest abdomen and pelvis which  is(are) reported separately.  Comparison: 09/06/2012 and earlier.  Findings: Chest findings are reported separately.  No lymphadenopathy at the thoracic inlet.  Several small left thyroid lobe nodules are re-identified.  Hypoechoic nodule on image 75 may be slightly larger than on  03/03/2012, now measuring 7-8 mm versus 6 mm before. These are too  small to characterize, but most likely benign in the absence of  known risk factors for thyroid carcinoma, and of doubtful  significance in this age group.  The stable larynx. Pharynx soft tissue contours are stable and  within normal limits. No pharyngeal or tonsillar mass.  Parapharyngeal and retropharyngeal spaces are stable and within  normal limits. Sublingual space is stable and within normal  limits. Mild postradiation changes to the submandibular gland.  Parotid glands are within normal limits. Visualized orbit soft  tissues are within normal limits. Negative  visualized brain  parenchyma. Visualized major vascular structures are patent.  There is chronic left greater than right calcified atherosclerosis  at the carotid bifurcations.  No cervical lymphadenopathy.  Chronic cervical spine degenerative changes including mild  spondylolisthesis at C3-C4. Multilevel small gas containing  degenerative endplate subchondral geodes, including a large gas  containing geode at the inferior T1 end plate on the right. No  acute osseous abnormality identified. Visualized paranasal sinuses  and mastoids are clear.  IMPRESSION:  1. Stable and negative post-therapy CT appearance of the neck. No  cervical lymphadenopathy.  2. Chest abdomen and pelvis are reported separately.  ASSESSMENT AND PLAN:   1. History of large B cell, NHL of right tonsil; s/p excisional biopsy; IPSS 1; good risk.  She again is still in complete remission. I explained to her that new data does not support routine surveillance CT scan for patient with history of lymphoma. However if she is concerning symptoms or abnormal tests, we may consider followup CT scan. She agreed with this recommendation.  2. Vaccination: OK to resume vaccination per PCP at this time.  Her chemo was about 1 year ago.  She can resume vaccination per Dr. Royden Purl discretion.   3. Followup:  Return to clinic in about 6 months.   4.  Portacath:  I referred her to IR to port removal.      The length of time of the face-to-face encounter was 15 minutes. More than 50% of time was spent counseling and coordination of care.

## 2013-03-10 ENCOUNTER — Other Ambulatory Visit (INDEPENDENT_AMBULATORY_CARE_PROVIDER_SITE_OTHER): Payer: Medicare Other

## 2013-03-10 DIAGNOSIS — R7309 Other abnormal glucose: Secondary | ICD-10-CM

## 2013-03-10 DIAGNOSIS — M899 Disorder of bone, unspecified: Secondary | ICD-10-CM

## 2013-03-10 DIAGNOSIS — E785 Hyperlipidemia, unspecified: Secondary | ICD-10-CM

## 2013-03-10 DIAGNOSIS — R739 Hyperglycemia, unspecified: Secondary | ICD-10-CM

## 2013-03-10 DIAGNOSIS — M949 Disorder of cartilage, unspecified: Secondary | ICD-10-CM

## 2013-03-10 DIAGNOSIS — K219 Gastro-esophageal reflux disease without esophagitis: Secondary | ICD-10-CM

## 2013-03-10 LAB — CBC WITH DIFFERENTIAL/PLATELET
Basophils Absolute: 0 10*3/uL (ref 0.0–0.1)
Basophils Relative: 0.4 % (ref 0.0–3.0)
Eosinophils Absolute: 0.1 10*3/uL (ref 0.0–0.7)
Eosinophils Relative: 2 % (ref 0.0–5.0)
HCT: 39.3 % (ref 36.0–46.0)
Hemoglobin: 13.2 g/dL (ref 12.0–15.0)
Lymphocytes Relative: 20.4 % (ref 12.0–46.0)
Lymphs Abs: 1.2 10*3/uL (ref 0.7–4.0)
MCHC: 33.6 g/dL (ref 30.0–36.0)
MCV: 89.3 fl (ref 78.0–100.0)
Monocytes Absolute: 0.6 10*3/uL (ref 0.1–1.0)
Monocytes Relative: 9.2 % (ref 3.0–12.0)
Neutro Abs: 4.1 10*3/uL (ref 1.4–7.7)
Neutrophils Relative %: 68 % (ref 43.0–77.0)
Platelets: 176 10*3/uL (ref 150.0–400.0)
RBC: 4.41 Mil/uL (ref 3.87–5.11)
RDW: 14.4 % (ref 11.5–14.6)
WBC: 6.1 10*3/uL (ref 4.5–10.5)

## 2013-03-10 LAB — COMPREHENSIVE METABOLIC PANEL
ALT: 22 U/L (ref 0–35)
AST: 24 U/L (ref 0–37)
Albumin: 4.1 g/dL (ref 3.5–5.2)
Alkaline Phosphatase: 55 U/L (ref 39–117)
BUN: 20 mg/dL (ref 6–23)
CO2: 25 mEq/L (ref 19–32)
Calcium: 9.2 mg/dL (ref 8.4–10.5)
Chloride: 109 mEq/L (ref 96–112)
Creatinine, Ser: 0.9 mg/dL (ref 0.4–1.2)
GFR: 68.95 mL/min (ref >60.00)
Glucose, Bld: 107 mg/dL — ABNORMAL HIGH (ref 70–99)
Potassium: 3.8 mEq/L (ref 3.5–5.1)
Sodium: 141 mEq/L (ref 135–145)
Total Bilirubin: 0.9 mg/dL (ref 0.3–1.2)
Total Protein: 6.7 g/dL (ref 6.0–8.3)

## 2013-03-10 LAB — LIPID PANEL
Cholesterol: 165 mg/dL (ref 0–200)
HDL: 40.7 mg/dL (ref >39.00)
LDL Cholesterol: 98 mg/dL (ref 0–99)
Total CHOL/HDL Ratio: 4
Triglycerides: 134 mg/dL (ref 0.0–149.0)
VLDL: 26.8 mg/dL (ref 0.0–40.0)

## 2013-03-10 LAB — HEMOGLOBIN A1C: Hgb A1c MFr Bld: 5.9 % (ref 4.6–6.5)

## 2013-03-11 LAB — VITAMIN D 25 HYDROXY (VIT D DEFICIENCY, FRACTURES): Vit D, 25-Hydroxy: 76 ng/mL (ref 30–89)

## 2013-03-14 ENCOUNTER — Other Ambulatory Visit: Payer: Self-pay | Admitting: Oncology

## 2013-03-14 ENCOUNTER — Telehealth: Payer: Self-pay | Admitting: *Deleted

## 2013-03-14 DIAGNOSIS — C8330 Diffuse large B-cell lymphoma, unspecified site: Secondary | ICD-10-CM

## 2013-03-14 NOTE — Telephone Encounter (Signed)
[facility] Needs an order to remove patient's port-a-cath, per tina

## 2013-03-15 ENCOUNTER — Telehealth: Payer: Self-pay | Admitting: Oncology

## 2013-03-15 NOTE — Telephone Encounter (Signed)
lmonvm for [employer] @ [facility] IR informing her that [facility] has entered order for port removal. IR will contact pt re appt. Pt given schedule for September prior to leaving 3/19.

## 2013-03-16 ENCOUNTER — Encounter: Payer: Self-pay | Admitting: Family Medicine

## 2013-03-16 ENCOUNTER — Ambulatory Visit (INDEPENDENT_AMBULATORY_CARE_PROVIDER_SITE_OTHER): Payer: Medicare Other | Admitting: Family Medicine

## 2013-03-16 VITALS — BP 110/66 | HR 77 | Temp 98.8°F | Ht 63.5 in | Wt 136.2 lb

## 2013-03-16 DIAGNOSIS — M25571 Pain in right ankle and joints of right foot: Secondary | ICD-10-CM

## 2013-03-16 DIAGNOSIS — M25579 Pain in unspecified ankle and joints of unspecified foot: Secondary | ICD-10-CM

## 2013-03-16 DIAGNOSIS — R7309 Other abnormal glucose: Secondary | ICD-10-CM

## 2013-03-16 DIAGNOSIS — Z23 Encounter for immunization: Secondary | ICD-10-CM

## 2013-03-16 DIAGNOSIS — M899 Disorder of bone, unspecified: Secondary | ICD-10-CM

## 2013-03-16 DIAGNOSIS — I2510 Atherosclerotic heart disease of native coronary artery without angina pectoris: Secondary | ICD-10-CM

## 2013-03-16 DIAGNOSIS — E785 Hyperlipidemia, unspecified: Secondary | ICD-10-CM

## 2013-03-16 DIAGNOSIS — I2584 Coronary atherosclerosis due to calcified coronary lesion: Secondary | ICD-10-CM

## 2013-03-16 DIAGNOSIS — R739 Hyperglycemia, unspecified: Secondary | ICD-10-CM

## 2013-03-16 DIAGNOSIS — Z0000 Encounter for general adult medical examination without abnormal findings: Secondary | ICD-10-CM

## 2013-03-16 MED ORDER — SIMVASTATIN 40 MG PO TABS: 40.0000 mg | ORAL_TABLET | Freq: Every day | ORAL | Status: DC

## 2013-03-16 MED ORDER — OMEPRAZOLE 20 MG PO CPDR: 20.0000 mg | DELAYED_RELEASE_CAPSULE | Freq: Every day | ORAL | Status: DC | PRN

## 2013-03-16 NOTE — Patient Instructions (Addendum)
For foot pain -make an appt with Dr Patsy Lager at check out  Td vaccine today  Please send for last dexa at [facility] office  We will do cardiology referral - Shirlee Limerick will call you about that - for the calcium seen on CT in your coronary arteries  Keep taking good care of yourself

## 2013-03-16 NOTE — Progress Notes (Signed)
Subjective:    Patient ID: Tara Sloan, female    DOB: [birth year], 67 y.o.   MRN: [id number]  HPI  She is doing well overall  She feels her age  Has had plantar fasciitis recently (? If rad or chemo cause it) - she sees chiropractor -has orthotics  Worse lately  Other foot hurts more now - under the ball of her foot   Also recent chest CT - did show some ca in her coronary arteries  She has not seen cardiol Has had one stress test in the past   I have personally reviewed the Medicare Annual Wellness questionnaire and have noted 1. The patient's medical and social history 2. Their use of alcohol, tobacco or illicit drugs 3. Their current medications and supplements 4. The patient's functional ability including ADL's, fall risks, home safety risks and hearing or visual             impairment. 5. Diet and physical activities 6. Evidence for depression or mood disorders  The patients weight, height, BMI have been recorded in the chart and visual acuity is per eye clinic.  I have made referrals, counseling and provided education to the patient based review of the above and I have provided the pt with a written personalized care plan for preventive services.  See scanned forms.  Routine anticipatory guidance given to patient.  See health maintenance. Flu 9/13 Shingles 10/13 PNA 12/11 Tetanus 03-- ? Needs that - Tdap vaccine today Colon 2006- 10 year recall for that  Breast cancer screening mammogram 8/13- ok  Self exam-no lumps  Has had a hysterectomy- no gyn cancer - and no symptoms whatsoever  Advance directive-has worked on that with her husband , and has POA and living will  Cognitive function addressed- see scanned forms- and if abnormal then additional documentation follows.  She thinks she has the normal amt of short term memory loss at her age- occ forgetful / short term - but family does not notice and she does not get lost or have accidents   (also had gen aneth- aff  memory)   Osteopenia dexa ordered 2/12- is good about having that done  Ca and D is good about taking that  Also goes to [employer] 5 days per week- good exercise   Hx of lymphoma Lab Results  Component Value Date   WBC 6.1 03/10/2013   HGB 13.2 03/10/2013   HCT 39.3 03/10/2013   MCV 89.3 03/10/2013   PLT 176.0 03/10/2013    Had hysterectomy in past  No gyn symptoms at all   Hyperglycemia  Lab Results  Component Value Date   HGBA1C 5.9 03/10/2013   this is stable   Hyperlipidemia on zocor 40 Lab Results  Component Value Date   CHOL 165 03/10/2013   CHOL 166 09/15/2012   CHOL 168 03/02/2012   Lab Results  Component Value Date   HDL 40.70 03/10/2013   HDL 40.98 09/15/2012   HDL 11.91* 03/02/2012   Lab Results  Component Value Date   LDLCALC 98 03/10/2013   LDLCALC 98 09/15/2012   LDLCALC 106* 12/06/2010   Lab Results  Component Value Date   TRIG 134.0 03/10/2013   TRIG 137.0 09/15/2012   TRIG 222.0* 03/02/2012   Lab Results  Component Value Date   CHOLHDL 4 03/10/2013   CHOLHDL 4 09/15/2012   CHOLHDL 5 03/02/2012   Lab Results  Component Value Date   LDLDIRECT 92.2 03/02/2012   in good control  Wt is down 1 lb with bmi of 23  PMH and SH reviewed  Meds, vitals, and allergies reviewed.   ROS: See HPI.  Otherwise negative.    Falls -none and no broken bones   Mood - is wonderful , stays motivated and happy   Patient Active Problem List  Diagnosis  . HYPERLIPIDEMIA  . ALLERGIC RHINITIS  . GERD  . OSTEOPENIA  . ALLERGY  . ARTHRITIS, HANDS, BILATERAL  . Strep throat  . Diffuse large B cell lymphoma  . Screening for lipoid disorders  . Diabetes mellitus screening  . Hyperglycemia  . Coronary artery calcinosis  . Pain in joint, ankle and foot   Past Medical History  Diagnosis Date  . HLD (hyperlipidemia)   . Osteopenia   . GERD (gastroesophageal reflux disease)   . Allergic rhinitis   . External hemorrhoid   . Arthritis     osteopenia  . Osteopenia    . History of radiation therapy 03/22/12 - 04/09/12    right oropharynx/neck  . 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 Cancer     nhl   Past Surgical History  Procedure Laterality Date  . Dilation and curettage of uterus  1987  . Partial hysterectomy  1988    fibroids  . Tubal ligation  1987  . Colonoscopy  5/06    Ext. hemms  . Breast biopsy  12/03    Negative  . Tonsillectomy  11/07/2011    Procedure: TONSILLECTOMY;  Surgeon: Drema Halon, MD;  Location: [facility];  Service: ENT;  Laterality: N/A;   History  Substance Use Topics  . Smoking status: Former Smoker -- 5 years    Quit date: 11/04/1974  . Smokeless tobacco: Never Used  . Alcohol Use: Yes     Comment: Rare   Family History  Problem Relation Age of Onset  . Heart attack Father 70  . Heart disease Father   . Stroke Mother 43  . Breast cancer Paternal Aunt   . Cancer Paternal Aunt   . Hyperlipidemia      Family history  . Cancer Paternal Aunt    No Known Allergies Current Outpatient Prescriptions on File Prior to Visit  Medication Sig Dispense Refill  . acetaminophen (TYLENOL) 325 MG tablet Take 650 mg by mouth every 6 (six) hours as needed. PAIN       . Aspirin-Acetaminophen (GOODY BODY PAIN) 500-325 MG PACK Take 0.5 packets by mouth as needed. PAIN       . Calcium Carbonate-Vitamin D (CALCIUM 600/VITAMIN D) 600-400 MG-UNIT per tablet Take 1 tablet by mouth 2 (two) times daily.        . GuaiFENesin (MUCINEX PO) PRN when sick      . Multiple Vitamin (MULTIVITAMIN) tablet Take 1 tablet by mouth daily.        . naproxen sodium (ANAPROX) 220 MG tablet Take 220 mg by mouth 2 (two) times daily as needed. PAIN        . sodium fluoride (PREVIDENT 5000 PLUS) 1.1 % CREA dental cream Apply thin ribbon of cream to tooth brush. Brush teeth for 2 minutes. Spit out excess-DO NOT swallow. DO NOT rinse afterwards. Repeat nightly.  1 Tube  11  . [DISCONTINUED]  prochlorperazine (COMPAZINE) 10 MG tablet Take 1 tablet (10 mg total) by mouth every 6 (six) hours as needed (Nausea or vomiting).  30 tablet  6   No current facility-administered  medications on file prior to visit.    Review of Systems Review of Systems  Constitutional: Negative for fever, appetite change, fatigue and unexpected weight change.  Eyes: Negative for pain and visual disturbance.  Respiratory: Negative for cough and shortness of breath.  neg for PND or orthopnea  Cardiovascular: Negative for cp or palpitations    Gastrointestinal: Negative for nausea, diarrhea and constipation.  Genitourinary: Negative for urgency and frequency.  Skin: Negative for pallor or rash   MSK pos for foot pain without swelling  Neurological: Negative for weakness, light-headedness, numbness and headaches.  Hematological: Negative for adenopathy. Does not bruise/bleed easily.  Psychiatric/Behavioral: Negative for dysphoric mood. The patient is not nervous/anxious.         Objective:   Physical Exam  Constitutional: She appears well-developed and well-nourished. No distress.  HENT:  Head: Normocephalic and atraumatic.  Right Ear: External ear normal.  Left Ear: External ear normal.  Nose: Nose normal.  Mouth/Throat: Oropharynx is clear and moist. No oropharyngeal exudate.  Eyes: Conjunctivae and EOM are normal. Pupils are equal, round, and reactive to light. Right eye exhibits no discharge. Left eye exhibits no discharge. No scleral icterus.  Neck: Normal range of motion. Neck supple. No JVD present. Carotid bruit is not present. No thyromegaly present.  Cardiovascular: Normal rate, regular rhythm, normal heart sounds and intact distal pulses.  Exam reveals no gallop.   Pulmonary/Chest: Effort normal and breath sounds normal. No respiratory distress. She has no wheezes. She has no rales.  Abdominal: Soft. Bowel sounds are normal. She exhibits no distension, no abdominal bruit and no mass. There  is no tenderness.  Genitourinary: No breast swelling, tenderness, discharge or bleeding.  Breast exam: No mass, nodules, thickening, tenderness, bulging, retraction, inflamation, nipple discharge or skin changes noted.  No axillary or clavicular LA.  Chaperoned exam.    Musculoskeletal: Normal range of motion. She exhibits tenderness. She exhibits no edema.  Some tenderness in arch of both feet Besides mild medial bunions no deformity  Lymphadenopathy:    She has no cervical adenopathy.  Neurological: She is alert. She has normal reflexes. No cranial nerve deficit. She exhibits normal muscle tone. Coordination normal.  Skin: Skin is warm and dry. No rash noted. No erythema. No pallor.  Psychiatric: She has a normal mood and affect.          Assessment & Plan:

## 2013-03-17 ENCOUNTER — Encounter (HOSPITAL_COMMUNITY): Payer: Self-pay | Admitting: Pharmacy Technician

## 2013-03-17 DIAGNOSIS — Z0000 Encounter for general adult medical examination without abnormal findings: Secondary | ICD-10-CM | POA: Insufficient documentation

## 2013-03-17 NOTE — Assessment & Plan Note (Signed)
Disc goals for lipids and reasons to control them Rev labs with pt Rev low sat fat diet in detail   

## 2013-03-17 NOTE — Assessment & Plan Note (Signed)
Seen incidentally on CT of chest - pt has no symptoms , and is treated for hyperlipidemia  Ref to cardiol for further eval

## 2013-03-17 NOTE — Assessment & Plan Note (Signed)
Reviewed health habits including diet and exercise and skin cancer prevention Also reviewed health mt list, fam hx and immunizations   No falls and mood is good  Wellness labs reviewed in detail

## 2013-03-17 NOTE — Assessment & Plan Note (Signed)
Up to date dexa No fragility fx  Disc ca and D

## 2013-03-17 NOTE — Assessment & Plan Note (Signed)
Pt wears orthotics and has hx of plantar fasciitis Opposite foot is more of a problem under metatarsals  Ref to Dr Patsy Lager for eval

## 2013-03-17 NOTE — Assessment & Plan Note (Signed)
Lab Results  Component Value Date   HGBA1C 5.9 03/10/2013   overall staying below DM numbers Disc low glycemic diet

## 2013-03-18 ENCOUNTER — Other Ambulatory Visit: Payer: Self-pay | Admitting: Radiology

## 2013-03-21 ENCOUNTER — Encounter: Payer: Self-pay | Admitting: Family Medicine

## 2013-03-21 ENCOUNTER — Ambulatory Visit (INDEPENDENT_AMBULATORY_CARE_PROVIDER_SITE_OTHER): Payer: Medicare Other | Admitting: Family Medicine

## 2013-03-21 VITALS — BP 118/68 | HR 84 | Temp 98.2°F | Wt 136.0 lb

## 2013-03-21 DIAGNOSIS — M21619 Bunion of unspecified foot: Secondary | ICD-10-CM

## 2013-03-21 DIAGNOSIS — M7741 Metatarsalgia, right foot: Secondary | ICD-10-CM

## 2013-03-21 DIAGNOSIS — Q667 Congenital pes cavus, unspecified foot: Secondary | ICD-10-CM

## 2013-03-21 DIAGNOSIS — M7750 Other enthesopathy of unspecified foot: Secondary | ICD-10-CM

## 2013-03-21 DIAGNOSIS — M2010 Hallux valgus (acquired), unspecified foot: Secondary | ICD-10-CM

## 2013-03-21 DIAGNOSIS — M205X9 Other deformities of toe(s) (acquired), unspecified foot: Secondary | ICD-10-CM

## 2013-03-21 NOTE — Patient Instructions (Addendum)
[facility] - on [address]. In [place], [place]

## 2013-03-21 NOTE — Progress Notes (Signed)
Nature conservation officer at [facility] [address] [place] [address] Phone: [phone] Fax: [phone]  Date:  03/21/2013   Name:  Tara Sloan   DOB:  [birth year]   MRN:  [id number] Gender: female Age: 67 y.o.  Primary Physician:  [name], MD  Evaluating MD: Hannah Beat, MD   Chief Complaint: Foot Pain   History of Present Illness:  Tara Sloan is a 67 y.o. pleasant patient who presents with the following:  Dr. Milinda Antis asked me to see this patient for foot pain.  Chemo and radiation last year for lymphoma, and got PF in the left heel. Now having more metatarsalgia. Retired a couple of years ago, and she did some Zumba, and a few times a year, and with walking on the forefoot. Her pain is mostly centered in the B forefoot, with no current pain in the heel, midfoot, or ankle.   No trauma or accident.   She does have some orthotics, but she did not bring them to the office.   She also gets some occ. R sided radiculopathy down the R leg.  Patient Active Problem List  Diagnosis  . HYPERLIPIDEMIA  . ALLERGIC RHINITIS  . GERD  . OSTEOPENIA  . ALLERGY  . ARTHRITIS, HANDS, BILATERAL  . Diffuse large B cell lymphoma  . Hyperglycemia  . Coronary artery calcinosis  . Pain in joint, ankle and foot  . Encounter for Medicare annual wellness exam    Past Medical History  Diagnosis Date  . HLD (hyperlipidemia)   . Osteopenia   . GERD (gastroesophageal reflux disease)   . Allergic rhinitis   . External hemorrhoid   . Arthritis     osteopenia  . Osteopenia   . History of radiation therapy 03/22/12 - 04/09/12    right oropharynx/neck  . 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 Cancer     nhl    Past Surgical History  Procedure Laterality Date  . Dilation and curettage of uterus  1987  . Partial hysterectomy  1988    fibroids  . Tubal ligation  1987  . Colonoscopy  5/06    Ext. hemms  . Breast biopsy  12/03   Negative  . Tonsillectomy  11/07/2011    Procedure: TONSILLECTOMY;  Surgeon: Drema Halon, MD;  Location: [facility];  Service: ENT;  Laterality: N/A;    History   Social History  . Marital Status: Married    Spouse Name: N/A    Number of Children: 2  . Years of Education: N/A   Occupational History  . Employed     [employer], Print production planner   Social History Main Topics  . Smoking status: Former Smoker -- 5 years    Quit date: 11/04/1974  . Smokeless tobacco: Never Used  . Alcohol Use: Yes     Comment: Rare  . Drug Use: No  . Sexually Active: Yes    Birth Control/ Protection: None   Other Topics Concern  . Not on file   Social History Narrative   Married (husband with a lot of health problems and CAD)      2 children      Exercise-going to [employer]      Employed-plans to retire at 80    Family History  Problem Relation Age of Onset  . Heart attack Father 59  . Heart disease Father   . Stroke Mother  79  . Breast cancer Paternal Aunt   . Cancer Paternal Aunt   . Hyperlipidemia      Family history  . Cancer Paternal Aunt     No Known Allergies  Medication list has been reviewed and updated.  Outpatient Prescriptions Prior to Visit  Medication Sig Dispense Refill  . acetaminophen (TYLENOL) 325 MG tablet Take 650 mg by mouth every 6 (six) hours as needed. PAIN       . aspirin 81 MG tablet Take 81 mg by mouth every evening.       . Aspirin-Acetaminophen (GOODY BODY PAIN) 500-325 MG PACK Take 0.5 packets by mouth daily as needed (for sinus headache). PAIN       . Calcium Carbonate-Vitamin D (CALCIUM 600/VITAMIN D) 600-400 MG-UNIT per tablet Take 1 tablet by mouth 2 (two) times daily.        . Calcium-Vitamin D-Vitamin K 500-100-40 MG-UNT-MCG CHEW Chew 1 tablet by mouth daily.      . Cholecalciferol (VITAMIN D-3) 1000 UNITS CAPS Take 1 capsule by mouth daily.      . Multiple Vitamin (MULTIVITAMIN) tablet Take 1 tablet by mouth  daily.        . naproxen sodium (ANAPROX) 220 MG tablet Take 220 mg by mouth 2 (two) times daily as needed (for pain). PAIN       . omeprazole (PRILOSEC) 20 MG capsule Take 20 mg by mouth daily as needed (for heartburn).      . simvastatin (ZOCOR) 40 MG tablet Take 40 mg by mouth every evening.      . sodium fluoride (PREVIDENT 5000 PLUS) 1.1 % CREA dental cream Apply thin ribbon of cream to tooth brush. Brush teeth for 2 minutes. Spit out excess-DO NOT swallow. DO NOT rinse afterwards. Repeat nightly.  1 Tube  11   No facility-administered medications prior to visit.    Review of Systems:   GEN: No fevers, chills. Nontoxic. Primarily MSK c/o today. MSK: Detailed in the HPI GI: tolerating PO intake without difficulty Neuro: detailed above Otherwise the pertinent positives of the ROS are noted above.    Physical Examination: BP 118/68  Pulse 84  Temp(Src) 98.2 F (36.8 C) (Oral)  Wt 136 lb (61.689 kg)  BMI 23.71 kg/m2  SpO2 98%  Ideal Body Weight:     GEN: WDWN, NAD, Non-toxic, Alert & Oriented x 3 HEENT: Atraumatic, Normocephalic.  Ears and Nose: No external deformity. EXTR: No clubbing/cyanosis/edema NEURO: Normal gait.  PSYCH: Normally interactive. Conversant. Not depressed or anxious appearing.  Calm demeanor.   FEET: b Echymosis: no Edema: no ROM: full LE B Gait: heel toe, non-antalgic MT pain: no Callus pattern: some on 4th and 5th toe Lateral Mall: NT Medial Mall: NT Talus: NT Navicular: NT Cuboid: NT Calcaneous: NT Metatarsals: pain between 4th and 5th, more on R, and to a lesser degree others 5th MT: NT Phalanges: NT Achilles: NT Plantar Fascia: NT Fat Pad: NT Peroneals: NT Post Tib: NT Great Toe: Nml motion Ant Drawer: neg ATFL: NT CFL: NT Deltoid: NT Other foot breakdown: none Long arch: preserved - high cavus foot Transverse arch: significant collapse. Bunion and bunionette formation with dropped 2-4 mt heads Hindfoot breakdown:  none Sensation: intact   Assessment and Plan:  Metatarsalgia of both feet  Pes cavus  Bunion of great toe, unspecified laterality  Bunionette, unspecified laterality  Metatarsalgia, may have at least 1 neuroma. D/w patient, and we are going to proceed conservatively first. We could doing a  neuroma injection. Ideally, she should bring her orthotics.   Given MT cookies for MT head relief  Orders Today:  No orders of the defined types were placed in this encounter.    Updated Medication List: (Includes new medications, updates to list, dose adjustments) No orders of the defined types were placed in this encounter.    Medications Discontinued: There are no discontinued medications.    Signed, Elpidio Galea. Lamonte Hartt, MD 03/21/2013 9:08 AM

## 2013-03-22 ENCOUNTER — Other Ambulatory Visit: Payer: Self-pay | Admitting: Radiology

## 2013-03-23 ENCOUNTER — Encounter (HOSPITAL_COMMUNITY): Payer: Self-pay

## 2013-03-23 ENCOUNTER — Ambulatory Visit (HOSPITAL_COMMUNITY)
Admission: RE | Admit: 2013-03-23 | Discharge: 2013-03-23 | Disposition: A | Discharge status: Home / Self Care | Payer: Medicare Other | Source: Ambulatory Visit | Attending: Oncology | Admitting: Oncology

## 2013-03-23 ENCOUNTER — Inpatient Hospital Stay (HOSPITAL_COMMUNITY): Admission: RE | Admit: 2013-03-23 | Payer: Self-pay | Source: Ambulatory Visit

## 2013-03-23 DIAGNOSIS — Z452 Encounter for adjustment and management of vascular access device: Secondary | ICD-10-CM | POA: Insufficient documentation

## 2013-03-23 DIAGNOSIS — K219 Gastro-esophageal reflux disease without esophagitis: Secondary | ICD-10-CM | POA: Insufficient documentation

## 2013-03-23 DIAGNOSIS — C8330 Diffuse large B-cell lymphoma, unspecified site: Secondary | ICD-10-CM

## 2013-03-23 DIAGNOSIS — C8589 Other specified types of non-Hodgkin lymphoma, extranodal and solid organ sites: Secondary | ICD-10-CM | POA: Insufficient documentation

## 2013-03-23 DIAGNOSIS — E785 Hyperlipidemia, unspecified: Secondary | ICD-10-CM | POA: Insufficient documentation

## 2013-03-23 DIAGNOSIS — M899 Disorder of bone, unspecified: Secondary | ICD-10-CM | POA: Insufficient documentation

## 2013-03-23 DIAGNOSIS — M949 Disorder of cartilage, unspecified: Secondary | ICD-10-CM | POA: Insufficient documentation

## 2013-03-23 MED ORDER — FENTANYL CITRATE 0.05 MG/ML IJ SOLN
INTRAMUSCULAR | Status: AC
  Filled 2013-03-23: qty 6

## 2013-03-23 MED ORDER — MIDAZOLAM HCL 2 MG/2ML IJ SOLN
INTRAMUSCULAR | Status: AC
  Filled 2013-03-23: qty 6

## 2013-03-23 MED ORDER — SODIUM CHLORIDE 0.9 % IV SOLN
INTRAVENOUS | Status: DC
  Administered 2013-03-23: 13:00:00 via INTRAVENOUS

## 2013-03-23 MED ORDER — FENTANYL CITRATE 0.05 MG/ML IJ SOLN
INTRAMUSCULAR | Status: AC | PRN
  Administered 2013-03-23 (×2): 25 ug via INTRAVENOUS

## 2013-03-23 MED ORDER — LIDOCAINE HCL 1 % IJ SOLN
INTRAMUSCULAR | Status: AC
  Filled 2013-03-23: qty 20

## 2013-03-23 MED ORDER — CEFAZOLIN SODIUM 1-5 GM-% IV SOLN
1.0000 g | Freq: Once | INTRAVENOUS | Status: AC
  Administered 2013-03-23: 1 g via INTRAVENOUS
  Filled 2013-03-23: qty 50

## 2013-03-23 MED ORDER — MIDAZOLAM HCL 2 MG/2ML IJ SOLN
INTRAMUSCULAR | Status: AC | PRN
  Administered 2013-03-23: 0.5 mg via INTRAVENOUS

## 2013-03-23 NOTE — Procedures (Signed)
Port removal No complication No blood loss. See complete dictation in [facility] PACS.  

## 2013-03-23 NOTE — H&P (Signed)
Chief Complaint: "I'm here for my port removal" Referring Physician:Ha HPI: Tara Sloan is an 67 y.o. female with hx of Lymphoma who has completed treatment. She is referred for port removal. PMHx and meds reviewed. Port originally placed 12/02/11 Feels well, no recent illness.  Past Medical History:  Past Medical History  Diagnosis Date  . HLD (hyperlipidemia)   . Osteopenia   . GERD (gastroesophageal reflux disease)   . Allergic rhinitis   . External hemorrhoid   . Arthritis     osteopenia  . Osteopenia   . History of radiation therapy 03/22/12 - 04/09/12    right oropharynx/neck  . 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 Cancer     nhl    Past Surgical History:  Past Surgical History  Procedure Laterality Date  . Dilation and curettage of uterus  1987  . Partial hysterectomy  1988    fibroids  . Tubal ligation  1987  . Colonoscopy  5/06    Ext. hemms  . Breast biopsy  12/03    Negative  . Tonsillectomy  11/07/2011    Procedure: TONSILLECTOMY;  Surgeon: Drema Halon, MD;  Location: [facility];  Service: ENT;  Laterality: N/A;    Family History:  Family History  Problem Relation Age of Onset  . Heart attack Father 37  . Heart disease Father   . Stroke Mother 34  . Breast cancer Paternal Aunt   . Cancer Paternal Aunt   . Hyperlipidemia      Family history  . Cancer Paternal Aunt     Social History:  reports that she quit smoking about 38 years ago. She has never used smokeless tobacco. She reports that  drinks alcohol. She reports that she does not use illicit drugs.  Allergies: No Known Allergies  Medications: acetaminophen (TYLENOL) 325 MG tablet (Taking) Sig - Route: Take 650 mg by mouth every 6 (six) hours as needed. PAIN  - Oral Class: Historical Med Number of times this order has been changed since signing: 1 Order [address] aspirin 81 MG tablet (Taking) Sig - Route: Take 81 mg by mouth every  evening. - Oral Class: Historical Med Number of times this order has been changed since signing: 2 Order [address] Aspirin-Acetaminophen (GOODY BODY PAIN) 500-325 MG PACK (Taking) Sig - Route: Take 0.5 packets by mouth daily as needed (for sinus headache). PAIN  - Oral Class: Historical Med Number of times this order has been changed since signing: 4 Order [address] Calcium Carbonate-Vitamin D (CALCIUM 600/VITAMIN D) 600-400 MG-UNIT per tablet (Taking) Sig - Route: Take 1 tablet by mouth 2 (two) times daily. - Oral Class: Historical Med Number of times this order has been changed since signing: 1 Order [address] Calcium-Vitamin D-Vitamin K 500-100-40 MG-UNT-MCG CHEW (Taking) Sig - Route: Chew 1 tablet by mouth daily. - Oral Class: Historical Med Number of times this order has been changed since signing: 1 Order [address] Cholecalciferol (VITAMIN D-3) 1000 UNITS CAPS (Taking) Sig - Route: Take 1 capsule by mouth daily. - Oral Class: Historical Med Number of times this order has been changed since signing: 1 Order [address] Multiple Vitamin (MULTIVITAMIN) tablet (Taking) Sig - Route: Take 1 tablet by mouth daily. - Oral Class: Historical Med Number of times this order has been changed since signing: 1 Order [address] naproxen sodium (ANAPROX) 220 MG tablet (Taking) Sig - Route: Take 220 mg by  mouth 2 (two) times daily as needed (for pain). PAIN  - Oral Class: Historical Med Number of times this order has been changed since signing: 1 Order [address] sodium fluoride (PREVIDENT 5000 PLUS) 1.1 % CREA dental cream (Taking) 1 Tube 11 04/28/2012 Sig: Apply thin ribbon of cream to tooth brush. Brush teeth for 2 minutes. Spit out excess-DO NOT swallow. DO NOT rinse afterwards   Please HPI for pertinent positives, otherwise complete 10 system ROS negative.  Physical Exam: Temp: 98.2, HR: 81, RR: 18, BP: 121/80, O2: 99%   General Appearance:  Alert, cooperative, no distress, appears stated age  Head:   Normocephalic, without obvious abnormality, atraumatic  ENT: Unremarkable  Neck: Supple, symmetrical, trachea midline.  Lungs:   Clear to auscultation bilaterally, no w/r/r, respirations unlabored without use of accessory muscles.  Chest Wall:  Rt port site without swelling, redness, tenderness  Heart:  Regular rate and rhythm, S1, S2 normal, no murmur, rub or gallop.   Neurologic: Normal affect, no gross deficits.   CBC    Component Value Date/Time   WBC 6.1 03/10/2013 0836   RBC 4.41 03/10/2013 0836   HGB 13.2 03/10/2013 0836   HCT 39.3 03/10/2013 0836   PLT 176.0 03/10/2013 0836      Assessment/Plan Hx of Lymphoma Port removal procedure discussed with pt, including risks, complications, use of sedation. Recent labs reviewed. Consent signed in chart  Brayton El PA-C 03/23/2013, 1:07 PM

## 2013-04-07 ENCOUNTER — Encounter: Payer: Self-pay | Admitting: Cardiovascular Disease

## 2013-04-07 ENCOUNTER — Ambulatory Visit (INDEPENDENT_AMBULATORY_CARE_PROVIDER_SITE_OTHER): Payer: Medicare Other | Admitting: Cardiovascular Disease

## 2013-04-07 VITALS — BP 102/70 | HR 94 | Ht 63.5 in | Wt 135.5 lb

## 2013-04-07 DIAGNOSIS — E785 Hyperlipidemia, unspecified: Secondary | ICD-10-CM

## 2013-04-07 DIAGNOSIS — I2510 Atherosclerotic heart disease of native coronary artery without angina pectoris: Secondary | ICD-10-CM

## 2013-04-07 DIAGNOSIS — I2584 Coronary atherosclerosis due to calcified coronary lesion: Secondary | ICD-10-CM

## 2013-04-07 DIAGNOSIS — K219 Gastro-esophageal reflux disease without esophagitis: Secondary | ICD-10-CM

## 2013-04-07 NOTE — Assessment & Plan Note (Addendum)
Mild coronary and aortic calcifications seen on CT scan in 2013 in 2014. Remote smoking history. Normal EKG. No symptoms with exertion. Prior stress test showing no ischemia several years ago. Given she is asymptomatic, very mild calcification low, we will pursue medical management. We have discussed this with her and she is in agreement.  We have recommended if she has any shortness of breath chest pain, that she call our office for further workup.

## 2013-04-07 NOTE — Patient Instructions (Addendum)
You are doing well. No medication changes were made.  Consider starting crestor 20 to 40 mg a day Or Zetia 5 to 10 mg a day in addition to simvastatin  (could combine simvastatin and zetia into vytorin 10/40 daily) 0r High dose generic lipitor 80 mg daily (start 40 mg and slowly increase)  Or simvastatin 80 mg daily (black box, my least favorite)  Call for any shortness of breath or chest pain  Please call us if you have new issues that need to be addressed before your next appt.

## 2013-04-07 NOTE — Progress Notes (Signed)
Patient ID: Tara Sloan, female    DOB: 06/30/1946, 67 y.o.   MRN: [id number]  HPI Comments: Ms. Tara Sloan is a very pleasant 67 year old woman with strong family history of coronary artery disease who presents by referral from Dr. Milinda Antis for evaluation of findings of PVD and coronary artery disease on CT scan. She does report remote smoking history for less than 10 years  She reports that she had a tonsillectomy last year, biopsy showing large cell non-Hodgkin's lymphoma. She was treated early last year in 2013 with chemotherapy and radiation treatment. Followup studies including full-body CT scan and PET scan did not show recurrent disease or metastases. CT scan did show coronary artery disease in the LAD, OM, RCA and aorta.  She does report having prior stress test in 2009 which showed no ischemia.  Review of her CT scans from September 2013 in March 2014 shows very mild atherosclerosis/PVD in the aortic arch, descending thoracic aorta. Also with mild CAD any proximal LAD, left circumflex and RCA. Difficult to determine extent given motion artifact. Left main was not well visualized in both CT scans given motion artifact. In general, calcifications seen mild. Otherwise normal size cardiac chambers.  She denies any symptoms of shortness of breath or chest pain with exertion. She's otherwise very active. She works out at [employer] on a regular basis with no symptoms.  EKG shows normal sinus rhythm with rate 94 beats per minute with no significant ST or T wave changes. Father had first MI in his mid 1s. He was a heavy smoker  Recent lab work shows LDL 98, total cholesterol in the 170 range   Outpatient Encounter Prescriptions as of 04/07/2013  Medication Sig Dispense Refill  . acetaminophen (TYLENOL) 325 MG tablet Take 650 mg by mouth every 6 (six) hours as needed. PAIN       . aspirin 81 MG tablet Take 81 mg by mouth every evening.       . Aspirin-Acetaminophen (GOODY BODY PAIN) 500-325 MG  PACK Take 0.5 packets by mouth daily as needed (for sinus headache). PAIN       . Calcium-Vitamin D-Vitamin K 500-100-40 MG-UNT-MCG CHEW Chew 2 tablets by mouth daily.       . Cholecalciferol (VITAMIN D-3) 1000 UNITS CAPS Take 1 capsule by mouth daily.      . Multiple Vitamin (MULTIVITAMIN) tablet Take 1 tablet by mouth daily.        . naproxen sodium (ANAPROX) 220 MG tablet Take 220 mg by mouth 2 (two) times daily as needed (for pain). PAIN       . omeprazole (PRILOSEC) 20 MG capsule Take 20 mg by mouth daily as needed (for heartburn).      . simvastatin (ZOCOR) 40 MG tablet Take 40 mg by mouth every evening.      . sodium fluoride (PREVIDENT 5000 PLUS) 1.1 % CREA dental cream Apply thin ribbon of cream to tooth brush. Brush teeth for 2 minutes. Spit out excess-DO NOT swallow. DO NOT rinse afterwards. Repeat nightly.  1 Tube  11    Review of Systems  Constitutional: Negative.   HENT: Negative.   Eyes: Negative.   Respiratory: Negative.   Cardiovascular: Negative.   Gastrointestinal: Negative.   Musculoskeletal: Negative.   Skin: Negative.   Neurological: Negative.   Psychiatric/Behavioral: Negative.   All other systems reviewed and are negative.    BP 102/70  Pulse 94  Ht 5' 3.5" (1.613 m)  Wt 135 lb 8 oz (61.462  kg)  BMI 23.62 kg/m2  Physical Exam  Nursing note and vitals reviewed. Constitutional: She is oriented to person, place, and time. She appears well-developed and well-nourished.  HENT:  Head: Normocephalic.  Nose: Nose normal.  Mouth/Throat: Oropharynx is clear and moist.  Eyes: Conjunctivae are normal. Pupils are equal, round, and reactive to light.  Neck: Normal range of motion. Neck supple. No JVD present.  Cardiovascular: Normal rate, regular rhythm, S1 normal, S2 normal, normal heart sounds and intact distal pulses.  Exam reveals no gallop and no friction rub.   No murmur heard. Pulmonary/Chest: Effort normal and breath sounds normal. No respiratory  distress. She has no wheezes. She has no rales. She exhibits no tenderness.  Abdominal: Soft. Bowel sounds are normal. She exhibits no distension. There is no tenderness.  Musculoskeletal: Normal range of motion. She exhibits no edema and no tenderness.  Lymphadenopathy:    She has no cervical adenopathy.  Neurological: She is alert and oriented to person, place, and time. Coordination normal.  Skin: Skin is warm and dry. No rash noted. No erythema.  Psychiatric: She has a normal mood and affect. Her behavior is normal. Judgment and thought content normal.    Assessment and Plan

## 2013-04-07 NOTE — Assessment & Plan Note (Signed)
Given her PVD and coronary calcification/atherosclerosis, ideal total cholesterol should be less than 150, LDL less than 70. We have discussed various ways this could be achieved which include changing to generic Lipitor 80 mg daily, starting Crestor 20 mg titrating up to 40 mg daily, starting zetia 10 mg daily. Alternates include adding WelChol or colestipol. She will talk with her family. Son has some knowledge in the pharmaceutical world. She will call us back with her decision. Alternative would be to work on her diet and exercise and recheck in 6 months time.

## 2013-04-08 ENCOUNTER — Ambulatory Visit: Payer: Self-pay | Admitting: Cardiovascular Disease

## 2013-04-18 ENCOUNTER — Telehealth: Payer: Self-pay

## 2013-04-18 NOTE — Telephone Encounter (Signed)
I have never in 15 years have a medicare patient tell me there tetanus shot was not covered ---- that is odd  (I do not give Tdap because I know that is not covered , but I do give Td) So- I may have TOLD the patient that it would be covered (my fault) - so can I waive the charge or pay for it myself ?

## 2013-04-18 NOTE — Telephone Encounter (Signed)
Pt left v/m requesting call back re: [employer] denying payment of TD; pt thought on 03/16/13 that Dr Milinda Antis told pt to wait and someone would come in and give her a plain Tetanus shot. Pt request clarification of what immunization she received on 03/16/13.Please advise.

## 2013-04-18 NOTE — Telephone Encounter (Signed)
She was given Td -not Tdap -- I have been under the impression that medicare covers this- and have never known of a case where it was denied--- Tara Sloan, can you clarify for me? Thanks

## 2013-04-18 NOTE — Telephone Encounter (Signed)
Medicare actually does not cover Tetanus.  I am not sure why.

## 2013-04-19 NOTE — Telephone Encounter (Signed)
Spoke with the patient.  Notified her that I would be sending a request to have her balance written off the for the tetanus shot she received due to the confusion of whether it was covered or not.  Patient was thankful and understood she had received a tetanus shot.

## 2013-06-01 ENCOUNTER — Other Ambulatory Visit: Payer: Self-pay

## 2013-06-01 ENCOUNTER — Telehealth: Payer: Self-pay

## 2013-06-01 MED ORDER — ATORVASTATIN CALCIUM 80 MG PO TABS: 40.0000 mg | ORAL_TABLET | Freq: Every day | ORAL | Status: DC

## 2013-06-01 NOTE — Telephone Encounter (Signed)
Per last OV note, pt was to investigate costs of diff chol meds She has chosen to try atorvastatin 40 mg daily Will sen in RX and will call her in 6 weeks to see how she is tolerating med unless she needs [place] sooner Understanding verb

## 2013-06-01 NOTE — Telephone Encounter (Signed)
Pt has some questions about her cholesterol medication. Please call

## 2013-06-02 ENCOUNTER — Telehealth: Payer: Self-pay | Admitting: *Deleted

## 2013-06-02 NOTE — Telephone Encounter (Signed)
Pt left VM asking how to go about getting [employer] claim forms completed by Dr. Gaylyn Rong?  Called pt back and left her a VM instructing her to bring the forms by our office and to call if any questions.

## 2013-06-03 ENCOUNTER — Encounter: Payer: Self-pay | Admitting: *Deleted

## 2013-06-03 NOTE — Progress Notes (Signed)
Pt dropped off a Claim Form to be completed for her Cancer Policy.  Forwarded to [employer] in [facility] Dept.

## 2013-06-06 ENCOUNTER — Encounter: Payer: Self-pay | Admitting: Oncology

## 2013-06-06 NOTE — Progress Notes (Signed)
Put cancer form in registration desk. °

## 2013-08-16 ENCOUNTER — Other Ambulatory Visit: Payer: Self-pay

## 2013-08-16 DIAGNOSIS — Z1231 Encounter for screening mammogram for malignant neoplasm of breast: Secondary | ICD-10-CM

## 2013-09-05 ENCOUNTER — Ambulatory Visit
Admission: RE | Admit: 2013-09-05 | Discharge: 2013-09-05 | Disposition: A | Discharge status: Home / Self Care | Payer: Medicare Other | Source: Ambulatory Visit

## 2013-09-05 DIAGNOSIS — Z1231 Encounter for screening mammogram for malignant neoplasm of breast: Secondary | ICD-10-CM

## 2013-09-06 ENCOUNTER — Telehealth: Payer: Self-pay | Admitting: Hematology and Oncology

## 2013-09-06 NOTE — Telephone Encounter (Signed)
Moved 9/19 appts to 10/17 w/NG. S/w pt re new appt for lb/NG 10/17.

## 2013-09-09 ENCOUNTER — Ambulatory Visit: Payer: Self-pay | Admitting: Oncology

## 2013-09-09 ENCOUNTER — Other Ambulatory Visit: Payer: Self-pay | Admitting: Lab

## 2013-09-21 ENCOUNTER — Other Ambulatory Visit: Payer: Self-pay | Admitting: Dermatology

## 2013-10-06 ENCOUNTER — Other Ambulatory Visit: Payer: Self-pay | Admitting: Hematology and Oncology

## 2013-10-06 DIAGNOSIS — C8330 Diffuse large B-cell lymphoma, unspecified site: Secondary | ICD-10-CM

## 2013-10-07 ENCOUNTER — Ambulatory Visit (HOSPITAL_BASED_OUTPATIENT_CLINIC_OR_DEPARTMENT_OTHER): Payer: Medicare Other | Admitting: Hematology and Oncology

## 2013-10-07 ENCOUNTER — Telehealth: Payer: Self-pay | Admitting: Hematology and Oncology

## 2013-10-07 ENCOUNTER — Encounter: Payer: Self-pay | Admitting: Hematology and Oncology

## 2013-10-07 ENCOUNTER — Other Ambulatory Visit (HOSPITAL_BASED_OUTPATIENT_CLINIC_OR_DEPARTMENT_OTHER): Payer: Medicare Other | Admitting: Lab

## 2013-10-07 VITALS — BP 125/75 | HR 85 | Temp 97.1°F | Resp 19 | Ht 63.5 in | Wt 139.7 lb

## 2013-10-07 DIAGNOSIS — C8589 Other specified types of non-Hodgkin lymphoma, extranodal and solid organ sites: Secondary | ICD-10-CM

## 2013-10-07 DIAGNOSIS — C8330 Diffuse large B-cell lymphoma, unspecified site: Secondary | ICD-10-CM

## 2013-10-07 LAB — COMPREHENSIVE METABOLIC PANEL (CC13)
ALT: 19 U/L (ref 0–55)
AST: 21 U/L (ref 5–34)
Albumin: 3.8 g/dL (ref 3.5–5.0)
Alkaline Phosphatase: 69 U/L (ref 40–150)
Anion Gap: 10 mEq/L (ref 3–11)
BUN: 18.8 mg/dL (ref 7.0–26.0)
CO2: 24 mEq/L (ref 22–29)
Calcium: 9.3 mg/dL (ref 8.4–10.4)
Chloride: 109 mEq/L (ref 98–109)
Creatinine: 0.9 mg/dL (ref 0.6–1.1)
Glucose: 108 mg/dl (ref 70–140)
Potassium: 3.5 mEq/L (ref 3.5–5.1)
Sodium: 143 mEq/L (ref 136–145)
Total Bilirubin: 0.84 mg/dL (ref 0.20–1.20)
Total Protein: 7 g/dL (ref 6.4–8.3)

## 2013-10-07 LAB — CBC WITH DIFFERENTIAL/PLATELET
BASO%: 0.9 % (ref 0.0–2.0)
Basophils Absolute: 0.1 10*3/uL (ref 0.0–0.1)
EOS%: 1.7 % (ref 0.0–7.0)
Eosinophils Absolute: 0.1 10*3/uL (ref 0.0–0.5)
HCT: 39.4 % (ref 34.8–46.6)
HGB: 13.2 g/dL (ref 11.6–15.9)
LYMPH%: 24 % (ref 14.0–49.7)
MCH: 30 pg (ref 25.1–34.0)
MCHC: 33.6 g/dL (ref 31.5–36.0)
MCV: 89.3 fL (ref 79.5–101.0)
MONO#: 0.5 10*3/uL (ref 0.1–0.9)
MONO%: 8.1 % (ref 0.0–14.0)
NEUT#: 4.2 10*3/uL (ref 1.5–6.5)
NEUT%: 65.3 % (ref 38.4–76.8)
Platelets: 183 10*3/uL (ref 145–400)
RBC: 4.41 10*6/uL (ref 3.70–5.45)
RDW: 13.6 % (ref 11.2–14.5)
WBC: 6.4 10*3/uL (ref 3.9–10.3)
lymph#: 1.5 10*3/uL (ref 0.9–3.3)

## 2013-10-07 LAB — LACTATE DEHYDROGENASE (CC13): LDH: 195 U/L (ref 125–245)

## 2013-10-07 NOTE — Progress Notes (Signed)
[facility] OFFICE PROGRESS NOTE  Patient Care Team: Judy Pimple, MD as PCP - General  DIAGNOSIS: History of stage I diffuse large B-cell lymphoma, IPSS score of 1, for further followup   SUMMARY OF ONCOLOGIC HISTORY: This is a pleasant 67 year old lady who was diagnosed with lymphoma last year. According to the patient, she fell an abnormal lump in the tonsil area that prompted an evaluation around July of 2012. She was first diagnosed with streptococcal infection treated with antibiotics that never went away. 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 She subsequently underwent further staging bone marrow aspirate and biopsy, and was subsequently found to have stage I disease In 2013, she completed 3 cycles of R. CHOP chemotherapy. She had profound fatigue and feeling unwell while on chemotherapy including nausea. After her chemotherapy was completed, she had radiation therapy, completed by April 2013 and she had been placed on observation since then  INTERVAL HISTORY: Tara Sloan 67 y.o. female returns for Further followup. She denies any new lumps are palpable lymphadenopathy. She denies any new skin lesions. She did followup with a dermatologist recently and with no new diagnosis of skin cancer. Her energy level is fair. She denies any recent fever, chills, night sweats or abnormal weight loss. All her preventive screening programs are up-to-date. The patient have remote history of hysterectomy. Mammogram is up-to-date. She never really received influenza vaccination this year.  I have reviewed the past medical history, past surgical history, social history and family history with the patient and they are unchanged from previous note.  ALLERGIES:  has No Known Allergies.  MEDICATIONS:  Current Outpatient Prescriptions  Medication Sig Dispense Refill  . acetaminophen (TYLENOL) 325  MG tablet Take 650 mg by mouth every 6 (six) hours as needed. PAIN       . aspirin 81 MG tablet Take 81 mg by mouth every evening.       . Aspirin-Acetaminophen (GOODY BODY PAIN) 500-325 MG PACK Take 0.5 packets by mouth daily as needed (for sinus headache). PAIN       . atorvastatin (LIPITOR) 80 MG tablet Take 0.5 tablets (40 mg total) by mouth daily.  90 tablet  3  . Calcium-Vitamin D-Vitamin K 500-100-40 MG-UNT-MCG CHEW Chew 2 tablets by mouth daily.       . Cholecalciferol (VITAMIN D-3) 1000 UNITS CAPS Take 1 capsule by mouth daily.      . Multiple Vitamin (MULTIVITAMIN) tablet Take 1 tablet by mouth daily.        . naproxen sodium (ANAPROX) 220 MG tablet Take 220 mg by mouth 2 (two) times daily as needed (for pain). PAIN       . omeprazole (PRILOSEC) 20 MG capsule Take 20 mg by mouth daily as needed (for heartburn).      . sodium fluoride (PREVIDENT 5000 PLUS) 1.1 % CREA dental cream Apply thin ribbon of cream to tooth brush. Brush teeth for 2 minutes. Spit out excess-DO NOT swallow. DO NOT rinse afterwards. Repeat nightly.  1 Tube  11  . [DISCONTINUED] prochlorperazine (COMPAZINE) 10 MG tablet Take 1 tablet (10 mg total) by mouth every 6 (six) hours as needed (Nausea or vomiting).  30 tablet  6   No current facility-administered medications for this visit.    REVIEW OF SYSTEMS:   Constitutional: Denies fevers, chills or abnormal weight loss Eyes: Denies blurriness of vision Ears, nose, mouth, throat, and face: Denies  mucositis or sore throat Respiratory: Denies cough, dyspnea or wheezes Cardiovascular: Denies palpitation, chest discomfort or lower extremity swelling Gastrointestinal:  Denies nausea, heartburn or change in bowel habits Skin: Denies abnormal skin rashes Lymphatics: Denies new lymphadenopathy or easy bruising Neurological:Denies numbness, tingling or new weaknesses Behavioral/Psych: Mood is stable, no new changes  All other systems were reviewed with the patient and  are negative.  PHYSICAL EXAMINATION: ECOG PERFORMANCE STATUS: 0 - Asymptomatic  Filed Vitals:   10/07/13 1202  BP: 125/75  Pulse: 85  Temp: 97.1 F (36.2 C)  Resp: 19   Filed Weights   10/07/13 1202  Weight: 139 lb 11.2 oz (63.368 kg)    GENERAL:alert, no distress and comfortable SKIN: skin color, texture, turgor are normal, no rashes or significant lesions EYES: normal, Conjunctiva are pink and non-injected, sclera clear OROPHARYNX:no exudate, no erythema and lips, buccal mucosa, and tongue normal  NECK: supple, thyroid normal size, non-tender, without nodularity LYMPH:  no palpable lymphadenopathy in the cervical, axillary or inguinal LUNGS: clear to auscultation and percussion with normal breathing effort HEART: regular rate & rhythm and no murmurs and no lower extremity edema ABDOMEN:abdomen soft, non-tender and normal bowel sounds Musculoskeletal:no cyanosis of digits and no clubbing  NEURO: alert & oriented x 3 with fluent speech, no focal motor/sensory deficits  LABORATORY DATA:  I have reviewed the data as listed    Component Value Date/Time   NA 143 10/07/2013 1152   NA 141 03/10/2013 0836   NA 145 03/03/2012 1201   K 3.5 10/07/2013 1152   K 3.8 03/10/2013 0836   K 3.8 03/03/2012 1201   CL 109 03/10/2013 0836   CL 105 03/08/2013 0851   CL 99 03/03/2012 1201   CO2 24 10/07/2013 1152   CO2 25 03/10/2013 0836   CO2 28 03/03/2012 1201   GLUCOSE 108 10/07/2013 1152   GLUCOSE 107* 03/10/2013 0836   GLUCOSE 108* 03/08/2013 0851   GLUCOSE 112 03/03/2012 1201   BUN 18.8 10/07/2013 1152   BUN 20 03/10/2013 0836   BUN 19 03/03/2012 1201   CREATININE 0.9 10/07/2013 1152   CREATININE 0.9 03/10/2013 0836   CREATININE 0.8 03/03/2012 1201   CALCIUM 9.3 10/07/2013 1152   CALCIUM 9.2 03/10/2013 0836   CALCIUM 9.1 03/03/2012 1201   PROT 7.0 10/07/2013 1152   PROT 6.7 03/10/2013 0836   PROT 6.8 03/03/2012 1201   ALBUMIN 3.8 10/07/2013 1152   ALBUMIN 4.1 03/10/2013 0836   AST 21  10/07/2013 1152   AST 24 03/10/2013 0836   AST 50* 03/03/2012 1201   ALT 19 10/07/2013 1152   ALT 22 03/10/2013 0836   ALT 64* 03/03/2012 1201   ALKPHOS 69 10/07/2013 1152   ALKPHOS 55 03/10/2013 0836   ALKPHOS 71 03/03/2012 1201   BILITOT 0.84 10/07/2013 1152   BILITOT 0.9 03/10/2013 0836   BILITOT 0.60 03/03/2012 1201   GFRNONAA 76.49 12/06/2010 0816   GFRAA 82 10/10/2008 1005    No results found for this basename: SPEP, UPEP,  kappa and lambda light chains    Lab Results  Component Value Date   WBC 6.4 10/07/2013   NEUTROABS 4.2 10/07/2013   HGB 13.2 10/07/2013   HCT 39.4 10/07/2013   MCV 89.3 10/07/2013   PLT 183 10/07/2013      Chemistry      Component Value Date/Time   NA 143 10/07/2013 1152   NA 141 03/10/2013 0836   NA 145 03/03/2012 1201   K 3.5 10/07/2013  1152   K 3.8 03/10/2013 0836   K 3.8 03/03/2012 1201   CL 109 03/10/2013 0836   CL 105 03/08/2013 0851   CL 99 03/03/2012 1201   CO2 24 10/07/2013 1152   CO2 25 03/10/2013 0836   CO2 28 03/03/2012 1201   BUN 18.8 10/07/2013 1152   BUN 20 03/10/2013 0836   BUN 19 03/03/2012 1201   CREATININE 0.9 10/07/2013 1152   CREATININE 0.9 03/10/2013 0836   CREATININE 0.8 03/03/2012 1201      Component Value Date/Time   CALCIUM 9.3 10/07/2013 1152   CALCIUM 9.2 03/10/2013 0836   CALCIUM 9.1 03/03/2012 1201   ALKPHOS 69 10/07/2013 1152   ALKPHOS 55 03/10/2013 0836   ALKPHOS 71 03/03/2012 1201   AST 21 10/07/2013 1152   AST 24 03/10/2013 0836   AST 50* 03/03/2012 1201   ALT 19 10/07/2013 1152   ALT 22 03/10/2013 0836   ALT 64* 03/03/2012 1201   BILITOT 0.84 10/07/2013 1152   BILITOT 0.9 03/10/2013 0836   BILITOT 0.60 03/03/2012 1201      ASSESSMENT:  History of diffuse large B-cell lymphoma, no evidence of disease  PLAN:  #1 diffuse large B-cell lymphoma Shows no evidence of disease recurrence. I would recommend history, physical examination, and blood work in 3 months. After that would change her to every 6 months visit.  There is no benefit of ordering imaging study right now, she have any signs and symptoms to suggest disease recurrence. #2 preventive care The patient is taking calcium and vitamin D supplement. Or her screening programs up today. She had her influenza vaccination this year.  Orders Placed This Encounter  Procedures  . CBC with Differential    Standing Status: Future     Number of Occurrences:      Standing Expiration Date: 06/29/2014  . Comprehensive metabolic panel    Standing Status: Future     Number of Occurrences:      Standing Expiration Date: 10/07/2014  . Lactate dehydrogenase    Standing Status: Future     Number of Occurrences:      Standing Expiration Date: 10/07/2014   All questions were answered. The patient knows to call the clinic with any problems, questions or concerns. No barriers to learning was detected. I spent 15 minutes counseling the patient face to face. The total time spent in the appointment was 20 minutes and more than 50% was on counseling and review of test results     [facility], Rudy Domek, MD 10/07/2013 12:59 PM

## 2013-10-07 NOTE — Telephone Encounter (Signed)
[facility] AND PRINTED APTP SCHED AND AVS FORPT FOR jAN 2015

## 2013-10-25 ENCOUNTER — Other Ambulatory Visit: Payer: Self-pay

## 2013-10-25 MED ORDER — ATORVASTATIN CALCIUM 80 MG PO TABS: 40.0000 mg | ORAL_TABLET | Freq: Every day | ORAL | Status: DC

## 2013-10-25 NOTE — Telephone Encounter (Signed)
Refill atorvastatin

## 2013-10-27 ENCOUNTER — Other Ambulatory Visit: Payer: Self-pay

## 2014-01-06 ENCOUNTER — Telehealth: Payer: Self-pay | Admitting: Hematology and Oncology

## 2014-01-06 ENCOUNTER — Encounter: Payer: Self-pay | Admitting: Hematology and Oncology

## 2014-01-06 ENCOUNTER — Ambulatory Visit (HOSPITAL_BASED_OUTPATIENT_CLINIC_OR_DEPARTMENT_OTHER): Payer: Medicare Other | Admitting: Hematology and Oncology

## 2014-01-06 ENCOUNTER — Other Ambulatory Visit (HOSPITAL_BASED_OUTPATIENT_CLINIC_OR_DEPARTMENT_OTHER): Payer: Medicare Other

## 2014-01-06 VITALS — BP 142/80 | HR 77 | Temp 98.5°F | Resp 18 | Ht 63.0 in | Wt 139.8 lb

## 2014-01-06 DIAGNOSIS — C8581 Other specified types of non-Hodgkin lymphoma, lymph nodes of head, face, and neck: Secondary | ICD-10-CM

## 2014-01-06 DIAGNOSIS — C8330 Diffuse large B-cell lymphoma, unspecified site: Secondary | ICD-10-CM

## 2014-01-06 LAB — COMPREHENSIVE METABOLIC PANEL (CC13)
ALT: 28 U/L (ref 0–55)
AST: 31 U/L (ref 5–34)
Albumin: 4.2 g/dL (ref 3.5–5.0)
Alkaline Phosphatase: 74 U/L (ref 40–150)
Anion Gap: 11 mEq/L (ref 3–11)
BUN: 20.4 mg/dL (ref 7.0–26.0)
CO2: 25 mEq/L (ref 22–29)
Calcium: 9.8 mg/dL (ref 8.4–10.4)
Chloride: 107 mEq/L (ref 98–109)
Creatinine: 1 mg/dL (ref 0.6–1.1)
Glucose: 105 mg/dl (ref 70–140)
Potassium: 4.1 mEq/L (ref 3.5–5.1)
Sodium: 142 mEq/L (ref 136–145)
Total Bilirubin: 0.78 mg/dL (ref 0.20–1.20)
Total Protein: 7.3 g/dL (ref 6.4–8.3)

## 2014-01-06 LAB — CBC WITH DIFFERENTIAL/PLATELET
BASO%: 0.9 % (ref 0.0–2.0)
Basophils Absolute: 0.1 10*3/uL (ref 0.0–0.1)
EOS%: 1.8 % (ref 0.0–7.0)
Eosinophils Absolute: 0.2 10*3/uL (ref 0.0–0.5)
HCT: 41.9 % (ref 34.8–46.6)
HGB: 14.1 g/dL (ref 11.6–15.9)
LYMPH%: 21.4 % (ref 14.0–49.7)
MCH: 30.5 pg (ref 25.1–34.0)
MCHC: 33.7 g/dL (ref 31.5–36.0)
MCV: 90.7 fL (ref 79.5–101.0)
MONO#: 0.8 10*3/uL (ref 0.1–0.9)
MONO%: 8.5 % (ref 0.0–14.0)
NEUT#: 6.3 10*3/uL (ref 1.5–6.5)
NEUT%: 67.4 % (ref 38.4–76.8)
Platelets: 197 10*3/uL (ref 145–400)
RBC: 4.62 10*6/uL (ref 3.70–5.45)
RDW: 13.7 % (ref 11.2–14.5)
WBC: 9.3 10*3/uL (ref 3.9–10.3)
lymph#: 2 10*3/uL (ref 0.9–3.3)

## 2014-01-06 LAB — LACTATE DEHYDROGENASE (CC13): LDH: 200 U/L (ref 125–245)

## 2014-01-06 NOTE — Telephone Encounter (Signed)
[facility] PT APPT SCHEDULE FOR SEPT °

## 2014-01-07 NOTE — Progress Notes (Signed)
[facility] OFFICE PROGRESS NOTE  Patient Care Team: Abner Greenspan, MD as PCP - General  DIAGNOSIS: History of diffuse large B-cell lymphoma  SUMMARY OF ONCOLOGIC HISTORY: This is a pleasant 68 year old lady who was diagnosed with lymphoma last year. According to the patient, she fell an abnormal lump in the tonsil area that prompted an evaluation around July of 2012. She was first diagnosed with streptococcal infection treated with antibiotics that never went away. 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 She subsequently underwent further staging bone marrow aspirate and biopsy, and was subsequently found to have stage I disease In 2013, she completed 3 cycles of R. CHOP chemotherapy. She had profound fatigue and feeling unwell while on chemotherapy including nausea. After her chemotherapy was completed, she had radiation therapy, completed by April 2013 and she had been placed on observation since then  INTERVAL HISTORY: Tara FABILA 68 y.o. female returns for further followup. She denies any palpable lumps. No new skin lesions. She denies any recent fever, chills, night sweats or abnormal weight loss Denies any recent infections  I have reviewed the past medical history, past surgical history, social history and family history with the patient and they are unchanged from previous note.  ALLERGIES:  has No Known Allergies.  MEDICATIONS:  Current Outpatient Prescriptions  Medication Sig Dispense Refill  . acetaminophen (TYLENOL) 325 MG tablet Take 650 mg by mouth every 6 (six) hours as needed. PAIN       . aspirin 81 MG tablet Take 81 mg by mouth every evening.       . Aspirin-Acetaminophen (GOODY BODY PAIN) 500-325 MG PACK Take 0.5 packets by mouth daily as needed (for sinus headache). PAIN       . atorvastatin (LIPITOR) 80 MG tablet Take 0.5 tablets (40 mg total) by mouth  daily.  90 tablet  3  . Calcium-Vitamin D-Vitamin K 500-100-40 MG-UNT-MCG CHEW Chew 2 tablets by mouth daily.       . Cholecalciferol (VITAMIN D-3) 1000 UNITS CAPS Take 1 capsule by mouth daily.      . Multiple Vitamin (MULTIVITAMIN) tablet Take 1 tablet by mouth daily.        . naproxen sodium (ANAPROX) 220 MG tablet Take 220 mg by mouth 2 (two) times daily as needed (for pain). PAIN       . omeprazole (PRILOSEC) 20 MG capsule Take 20 mg by mouth daily as needed (for heartburn).      . [DISCONTINUED] prochlorperazine (COMPAZINE) 10 MG tablet Take 1 tablet (10 mg total) by mouth every 6 (six) hours as needed (Nausea or vomiting).  30 tablet  6   No current facility-administered medications for this visit.    REVIEW OF SYSTEMS:   Constitutional: Denies fevers, chills or abnormal weight loss Eyes: Denies blurriness of vision Ears, nose, mouth, throat, and face: Denies mucositis or sore throat Respiratory: Denies cough, dyspnea or wheezes Cardiovascular: Denies palpitation, chest discomfort or lower extremity swelling Gastrointestinal:  Denies nausea, heartburn or change in bowel habits Skin: Denies abnormal skin rashes Lymphatics: Denies new lymphadenopathy or easy bruising Neurological:Denies numbness, tingling or new weaknesses Behavioral/Psych: Mood is stable, no new changes  All other systems were reviewed with the patient and are negative.  PHYSICAL EXAMINATION: ECOG PERFORMANCE STATUS: 0 - Asymptomatic  Filed Vitals:   01/06/14 1203  BP: 142/80  Pulse: 77  Temp: 98.5 F (36.9 C)  Resp: 18  Filed Weights   01/06/14 1203  Weight: 139 lb 12.8 oz (63.413 kg)    GENERAL:alert, no distress and comfortable SKIN: skin color, texture, turgor are normal, no rashes or significant lesions EYES: normal, Conjunctiva are pink and non-injected, sclera clear OROPHARYNX:no exudate, no erythema and lips, buccal mucosa, and tongue normal  NECK: supple, thyroid normal size, non-tender,  without nodularity LYMPH:  no palpable lymphadenopathy in the cervical, axillary or inguinal LUNGS: clear to auscultation and percussion with normal breathing effort HEART: regular rate & rhythm and no murmurs and no lower extremity edema ABDOMEN:abdomen soft, non-tender and normal bowel sounds Musculoskeletal:no cyanosis of digits and no clubbing  NEURO: alert & oriented x 3 with fluent speech, no focal motor/sensory deficits  LABORATORY DATA:  I have reviewed the data as listed    Component Value Date/Time   NA 142 01/06/2014 1152   NA 141 03/10/2013 0836   NA 145 03/03/2012 1201   K 4.1 01/06/2014 1152   K 3.8 03/10/2013 0836   K 3.8 03/03/2012 1201   CL 109 03/10/2013 0836   CL 105 03/08/2013 0851   CL 99 03/03/2012 1201   CO2 25 01/06/2014 1152   CO2 25 03/10/2013 0836   CO2 28 03/03/2012 1201   GLUCOSE 105 01/06/2014 1152   GLUCOSE 107* 03/10/2013 0836   GLUCOSE 108* 03/08/2013 0851   GLUCOSE 112 03/03/2012 1201   BUN 20.4 01/06/2014 1152   BUN 20 03/10/2013 0836   BUN 19 03/03/2012 1201   CREATININE 1.0 01/06/2014 1152   CREATININE 0.9 03/10/2013 0836   CREATININE 0.8 03/03/2012 1201   CALCIUM 9.8 01/06/2014 1152   CALCIUM 9.2 03/10/2013 0836   CALCIUM 9.1 03/03/2012 1201   PROT 7.3 01/06/2014 1152   PROT 6.7 03/10/2013 0836   PROT 6.8 03/03/2012 1201   ALBUMIN 4.2 01/06/2014 1152   ALBUMIN 4.1 03/10/2013 0836   AST 31 01/06/2014 1152   AST 24 03/10/2013 0836   AST 50* 03/03/2012 1201   ALT 28 01/06/2014 1152   ALT 22 03/10/2013 0836   ALT 64* 03/03/2012 1201   ALKPHOS 74 01/06/2014 1152   ALKPHOS 55 03/10/2013 0836   ALKPHOS 71 03/03/2012 1201   BILITOT 0.78 01/06/2014 1152   BILITOT 0.9 03/10/2013 0836   BILITOT 0.60 03/03/2012 1201   GFRNONAA 76.49 12/06/2010 0816   GFRAA 82 10/10/2008 1005    No results found for this basename: SPEP,  UPEP,   kappa and lambda light chains    Lab Results  Component Value Date   WBC 9.3 01/06/2014   NEUTROABS 6.3 01/06/2014   HGB 14.1 01/06/2014   HCT  41.9 01/06/2014   MCV 90.7 01/06/2014   PLT 197 01/06/2014      Chemistry      Component Value Date/Time   NA 142 01/06/2014 1152   NA 141 03/10/2013 0836   NA 145 03/03/2012 1201   K 4.1 01/06/2014 1152   K 3.8 03/10/2013 0836   K 3.8 03/03/2012 1201   CL 109 03/10/2013 0836   CL 105 03/08/2013 0851   CL 99 03/03/2012 1201   CO2 25 01/06/2014 1152   CO2 25 03/10/2013 0836   CO2 28 03/03/2012 1201   BUN 20.4 01/06/2014 1152   BUN 20 03/10/2013 0836   BUN 19 03/03/2012 1201   CREATININE 1.0 01/06/2014 1152   CREATININE 0.9 03/10/2013 0836   CREATININE 0.8 03/03/2012 1201      Component Value Date/Time   CALCIUM 9.8  01/06/2014 1152   CALCIUM 9.2 03/10/2013 0836   CALCIUM 9.1 03/03/2012 1201   ALKPHOS 74 01/06/2014 1152   ALKPHOS 55 03/10/2013 0836   ALKPHOS 71 03/03/2012 1201   AST 31 01/06/2014 1152   AST 24 03/10/2013 0836   AST 50* 03/03/2012 1201   ALT 28 01/06/2014 1152   ALT 22 03/10/2013 0836   ALT 64* 03/03/2012 1201   BILITOT 0.78 01/06/2014 1152   BILITOT 0.9 03/10/2013 0836   BILITOT 0.60 03/03/2012 1201      ASSESSMENT & PLAN:  #1 diffuse large B-cell lymphoma There is no evidence of disease recurrence. I would recommend history, physical examination, and blood work in 8 months. After that would change her to every 12 months visit. There is no benefit of ordering imaging study right now, she have any signs and symptoms to suggest disease recurrence. #2 preventive care The patient is taking calcium and vitamin D supplement. Or her screening programs up today. She had her influenza vaccination this year.   Orders Placed This Encounter  Procedures  . CBC with Differential    Standing Status: Future     Number of Occurrences:      Standing Expiration Date: 09/28/2014  . Comprehensive metabolic panel    Standing Status: Future     Number of Occurrences:      Standing Expiration Date: 01/06/2015  . Lactate dehydrogenase    Standing Status: Future     Number of Occurrences:       Standing Expiration Date: 01/06/2015   All questions were answered. The patient knows to call the clinic with any problems, questions or concerns. No barriers to learning was detected. I spent 15 minutes counseling the patient face to face. The total time spent in the appointment was 20 minutes and more than 50% was on counseling and review of test results     [facility], [place], MD 01/07/2014 8:06 AM

## 2014-06-10 ENCOUNTER — Telehealth: Payer: Self-pay | Admitting: Family Medicine

## 2014-06-10 DIAGNOSIS — M899 Disorder of bone, unspecified: Secondary | ICD-10-CM

## 2014-06-10 DIAGNOSIS — C8330 Diffuse large B-cell lymphoma, unspecified site: Secondary | ICD-10-CM

## 2014-06-10 DIAGNOSIS — R739 Hyperglycemia, unspecified: Secondary | ICD-10-CM

## 2014-06-10 DIAGNOSIS — M949 Disorder of cartilage, unspecified: Principal | ICD-10-CM

## 2014-06-10 DIAGNOSIS — E785 Hyperlipidemia, unspecified: Secondary | ICD-10-CM

## 2014-06-10 NOTE — Telephone Encounter (Signed)
Message copied by Abner Greenspan on Sat Jun 10, 2014  4:40 PM ------      Message from: Ellamae Sia      Created: Tue Jun 06, 2014  4:36 PM      Regarding: lab orders       Patient is scheduled for CPX labs, please order future labs, Thanks , Terri       ------

## 2014-06-16 ENCOUNTER — Other Ambulatory Visit: Payer: Self-pay

## 2014-06-19 ENCOUNTER — Other Ambulatory Visit (INDEPENDENT_AMBULATORY_CARE_PROVIDER_SITE_OTHER): Payer: Medicare Other

## 2014-06-19 DIAGNOSIS — M899 Disorder of bone, unspecified: Secondary | ICD-10-CM

## 2014-06-19 DIAGNOSIS — M949 Disorder of cartilage, unspecified: Secondary | ICD-10-CM

## 2014-06-19 DIAGNOSIS — C8589 Other specified types of non-Hodgkin lymphoma, extranodal and solid organ sites: Secondary | ICD-10-CM

## 2014-06-19 DIAGNOSIS — R739 Hyperglycemia, unspecified: Secondary | ICD-10-CM

## 2014-06-19 DIAGNOSIS — C8330 Diffuse large B-cell lymphoma, unspecified site: Secondary | ICD-10-CM

## 2014-06-19 DIAGNOSIS — R7309 Other abnormal glucose: Secondary | ICD-10-CM

## 2014-06-19 DIAGNOSIS — E785 Hyperlipidemia, unspecified: Secondary | ICD-10-CM

## 2014-06-19 LAB — COMPREHENSIVE METABOLIC PANEL
ALT: 21 U/L (ref 0–35)
AST: 30 U/L (ref 0–37)
Albumin: 4.1 g/dL (ref 3.5–5.2)
Alkaline Phosphatase: 57 U/L (ref 39–117)
BUN: 22 mg/dL (ref 6–23)
CO2: 27 mEq/L (ref 19–32)
Calcium: 9.4 mg/dL (ref 8.4–10.5)
Chloride: 107 mEq/L (ref 96–112)
Creatinine, Ser: 0.9 mg/dL (ref 0.4–1.2)
GFR: 66.06 mL/min (ref >60.00)
Glucose, Bld: 99 mg/dL (ref 70–99)
Potassium: 3.9 mEq/L (ref 3.5–5.1)
Sodium: 140 mEq/L (ref 135–145)
Total Bilirubin: 0.9 mg/dL (ref 0.2–1.2)
Total Protein: 7.1 g/dL (ref 6.0–8.3)

## 2014-06-19 LAB — CBC WITH DIFFERENTIAL/PLATELET
Basophils Absolute: 0 10*3/uL (ref 0.0–0.1)
Basophils Relative: 0.7 % (ref 0.0–3.0)
Eosinophils Absolute: 0.1 10*3/uL (ref 0.0–0.7)
Eosinophils Relative: 2 % (ref 0.0–5.0)
HCT: 41.1 % (ref 36.0–46.0)
Hemoglobin: 13.5 g/dL (ref 12.0–15.0)
Lymphocytes Relative: 25.9 % (ref 12.0–46.0)
Lymphs Abs: 1.7 10*3/uL (ref 0.7–4.0)
MCHC: 32.8 g/dL (ref 30.0–36.0)
MCV: 91.7 fl (ref 78.0–100.0)
Monocytes Absolute: 0.6 10*3/uL (ref 0.1–1.0)
Monocytes Relative: 9 % (ref 3.0–12.0)
Neutro Abs: 4.2 10*3/uL (ref 1.4–7.7)
Neutrophils Relative %: 62.4 % (ref 43.0–77.0)
Platelets: 193 10*3/uL (ref 150.0–400.0)
RBC: 4.48 Mil/uL (ref 3.87–5.11)
RDW: 13.6 % (ref 11.5–15.5)
WBC: 6.8 10*3/uL (ref 4.0–10.5)

## 2014-06-19 LAB — LIPID PANEL
Cholesterol: 171 mg/dL (ref 0–200)
HDL: 44.2 mg/dL (ref >39.00)
LDL Cholesterol: 89 mg/dL (ref 0–99)
NonHDL: 126.8
Total CHOL/HDL Ratio: 4
Triglycerides: 187 mg/dL — ABNORMAL HIGH (ref 0.0–149.0)
VLDL: 37.4 mg/dL (ref 0.0–40.0)

## 2014-06-19 LAB — VITAMIN D 25 HYDROXY (VIT D DEFICIENCY, FRACTURES): VITD: 78.84 ng/mL

## 2014-06-19 LAB — TSH: TSH: 2.23 u[IU]/mL (ref 0.35–4.50)

## 2014-06-19 LAB — HEMOGLOBIN A1C: Hgb A1c MFr Bld: 6.1 % (ref 4.6–6.5)

## 2014-06-27 ENCOUNTER — Ambulatory Visit (INDEPENDENT_AMBULATORY_CARE_PROVIDER_SITE_OTHER): Payer: Medicare Other | Admitting: Family Medicine

## 2014-06-27 ENCOUNTER — Encounter: Payer: Self-pay | Admitting: Family Medicine

## 2014-06-27 VITALS — BP 126/68 | HR 74 | Temp 98.3°F | Ht 63.5 in | Wt 138.8 lb

## 2014-06-27 DIAGNOSIS — Z23 Encounter for immunization: Secondary | ICD-10-CM

## 2014-06-27 DIAGNOSIS — R7309 Other abnormal glucose: Secondary | ICD-10-CM

## 2014-06-27 DIAGNOSIS — E785 Hyperlipidemia, unspecified: Secondary | ICD-10-CM

## 2014-06-27 DIAGNOSIS — Z0000 Encounter for general adult medical examination without abnormal findings: Secondary | ICD-10-CM

## 2014-06-27 DIAGNOSIS — R739 Hyperglycemia, unspecified: Secondary | ICD-10-CM

## 2014-06-27 DIAGNOSIS — M949 Disorder of cartilage, unspecified: Secondary | ICD-10-CM

## 2014-06-27 DIAGNOSIS — M899 Disorder of bone, unspecified: Secondary | ICD-10-CM

## 2014-06-27 MED ORDER — OMEPRAZOLE 20 MG PO CPDR: 20.0000 mg | DELAYED_RELEASE_CAPSULE | Freq: Every day | ORAL | Status: DC | PRN

## 2014-06-27 NOTE — Progress Notes (Signed)
Subjective:    Patient ID: Tara Sloan, female    DOB: 13-Mar-1946, 68 y.o.   MRN: [id number]  HPI I have personally reviewed the Medicare Annual Wellness questionnaire and have noted 1. The patient's medical and social history 2. Their use of alcohol, tobacco or illicit drugs 3. Their current medications and supplements 4. The patient's functional ability including ADL's, fall risks, home safety risks and hearing or visual             impairment. 5. Diet and physical activities 6. Evidence for depression or mood disorders  The patients weight, height, BMI have been recorded in the chart and visual acuity is per eye clinic.  I have made referrals, counseling and provided education to the patient based review of the above and I have provided the pt with a written personalized care plan for preventive services.  Doing well overall  Just had a tick bite several weeks ago - deer tick and she got sick and feverish- feels better after doxycycline  [facility] [employer] and [facility] - still a bit tired    See scanned forms.  Routine anticipatory guidance given to patient.  See health maintenance. Colon cancer screening colonosc 5/06  10 year recall  Breast cancer screening mammogram 9/14- they send her a letter  Self breast exam- no lumps  Flu vaccine 9/14  Tetanus vaccine 3/14 Pneumovax 12/11- wants prevnar vaccine today  Zoster vaccine 10/13  Has had hysterectomy  Due for a bone density - will schedule it  Advance directive- has one drawn up with POA and living will  Cognitive function addressed- see scanned forms- and if abnormal then additional documentation follows. No worrries about memory - some age related slow down but not bad   PMH and SH reviewed  Meds, vitals, and allergies reviewed.   ROS: See HPI.  Otherwise negative.      Hyperglycemia Lab Results  Component Value Date   HGBA1C 6.1 06/19/2014  up from 5.9 Diet- is trying to watch sugar -but just had  vacation and ate too much    Hyperlipidemia  Lab Results  Component Value Date   CHOL 171 06/19/2014   CHOL 165 03/10/2013   CHOL 166 09/15/2012   Lab Results  Component Value Date   HDL 44.20 06/19/2014   HDL 40.70 03/10/2013   HDL 40.50 09/15/2012   Lab Results  Component Value Date   LDLCALC 89 06/19/2014   LDLCALC 98 03/10/2013   LDLCALC 98 09/15/2012   Lab Results  Component Value Date   TRIG 187.0* 06/19/2014   TRIG 134.0 03/10/2013   TRIG 137.0 09/15/2012   Lab Results  Component Value Date   CHOLHDL 4 06/19/2014   CHOLHDL 4 03/10/2013   CHOLHDL 4 09/15/2012   Lab Results  Component Value Date   LDLDIRECT 92.2 03/02/2012    On atorvastatin - 40 mg - cardiology may titrate that   Review of Systems Review of Systems  Constitutional: Negative for fever, appetite change, fatigue and unexpected weight change.  Eyes: Negative for pain and visual disturbance.  Respiratory: Negative for cough and shortness of breath.   Cardiovascular: Negative for cp or palpitations    Gastrointestinal: Negative for nausea, diarrhea and constipation.  Genitourinary: Negative for urgency and frequency.  Skin: Negative for pallor or rash   Neurological: Negative for weakness, light-headedness, numbness and headaches.  Hematological: Negative for adenopathy. Does not bruise/bleed easily.  Psychiatric/Behavioral: Negative for dysphoric mood. The patient is  not nervous/anxious.         Objective:   Physical Exam  Constitutional: She appears well-developed and well-nourished. No distress.  HENT:  Head: Normocephalic and atraumatic.  Right Ear: External ear normal.  Left Ear: External ear normal.  Mouth/Throat: Oropharynx is clear and moist.  Eyes: Conjunctivae and EOM are normal. Pupils are equal, round, and reactive to light. No scleral icterus.  Neck: Normal range of motion. Neck supple. No JVD present. Carotid bruit is not present. No thyromegaly present.  Cardiovascular: Normal rate,  regular rhythm, normal heart sounds and intact distal pulses.  Exam reveals no gallop.   Pulmonary/Chest: Effort normal and breath sounds normal. No respiratory distress. She has no wheezes. She exhibits no tenderness.  Abdominal: Soft. Bowel sounds are normal. She exhibits no distension, no abdominal bruit and no mass. There is no tenderness.  Genitourinary: No breast swelling, tenderness, discharge or bleeding.  Breast exam: No mass, nodules, thickening, tenderness, bulging, retraction, inflamation, nipple discharge or skin changes noted.  No axillary or clavicular LA.      Musculoskeletal: Normal range of motion. She exhibits no edema and no tenderness.  OA in hands   Lymphadenopathy:    She has no cervical adenopathy.  Neurological: She is alert. She has normal reflexes. No cranial nerve deficit. She exhibits normal muscle tone. Coordination normal.  Skin: Skin is warm and dry. No rash noted. No erythema. No pallor.  Psychiatric: She has a normal mood and affect.          Assessment & Plan:   Problem List Items Addressed This Visit     Musculoskeletal and Integument   OSTEOPENIA     Disc need for calcium/ vitamin D/ wt bearing exercise and bone density test every 2 y to monitor Disc safety/ fracture risk in detail  Will schedule f/u dexa  No falls or fx     Relevant Orders      DG Bone Density     Other   HYPERLIPIDEMIA     Fairly controlled with lipitor 40 Disc goals for lipids and reasons to control them Rev labs with pt Rev low sat fat diet in detail For cardiol f/u soon    Hyperglycemia      Lab Results  Component Value Date   HGBA1C 6.1 06/19/2014    Disc low glycemic diet and exercise to prev DM    Encounter for Medicare annual wellness exam - Primary     Reviewed health habits including diet and exercise and skin cancer prevention Reviewed appropriate screening tests for age  Also reviewed health mt list, fam hx and immunization status , as well as social  and family history   See HPI prevnar vaccine today Lab rev     Other Visit Diagnoses   Need for vaccination with 13-polyvalent pneumococcal conjugate vaccine        Relevant Orders       Pneumococcal conjugate vaccine 13-valent (Completed)

## 2014-06-27 NOTE — Assessment & Plan Note (Signed)
Fairly controlled with lipitor 40 Disc goals for lipids and reasons to control them Rev labs with pt Rev low sat fat diet in detail For cardiol f/u soon

## 2014-06-27 NOTE — Assessment & Plan Note (Signed)
Disc need for calcium/ vitamin D/ wt bearing exercise and bone density test every 2 y to monitor Disc safety/ fracture risk in detail  Will schedule f/u dexa  No falls or fx

## 2014-06-27 NOTE — Assessment & Plan Note (Signed)
Reviewed health habits including diet and exercise and skin cancer prevention Reviewed appropriate screening tests for age  Also reviewed health mt list, fam hx and immunization status , as well as social and family history   See HPI prevnar vaccine today Lab rev

## 2014-06-27 NOTE — Patient Instructions (Signed)
Stop up front for a bone density referral  prevnar vaccine today Take care of yourself

## 2014-06-27 NOTE — Progress Notes (Signed)
Pre visit review using our clinic review tool, if applicable. No additional management support is needed unless otherwise documented below in the visit note. 

## 2014-06-27 NOTE — Assessment & Plan Note (Signed)
Lab Results  Component Value Date   HGBA1C 6.1 06/19/2014    Disc low glycemic diet and exercise to prev DM

## 2014-06-28 ENCOUNTER — Ambulatory Visit (INDEPENDENT_AMBULATORY_CARE_PROVIDER_SITE_OTHER)
Admission: RE | Admit: 2014-06-28 | Discharge: 2014-06-28 | Disposition: A | Discharge status: Home / Self Care | Payer: Medicare Other | Source: Ambulatory Visit | Attending: Family Medicine | Admitting: Family Medicine

## 2014-06-28 DIAGNOSIS — M899 Disorder of bone, unspecified: Secondary | ICD-10-CM

## 2014-06-28 DIAGNOSIS — M949 Disorder of cartilage, unspecified: Principal | ICD-10-CM

## 2014-07-04 ENCOUNTER — Ambulatory Visit: Payer: Self-pay | Admitting: Family Medicine

## 2014-07-06 ENCOUNTER — Encounter: Payer: Self-pay | Admitting: Cardiovascular Disease

## 2014-07-06 ENCOUNTER — Ambulatory Visit (INDEPENDENT_AMBULATORY_CARE_PROVIDER_SITE_OTHER): Payer: Medicare Other | Admitting: Cardiovascular Disease

## 2014-07-06 VITALS — BP 134/86 | HR 74 | Ht 63.5 in | Wt 139.5 lb

## 2014-07-06 DIAGNOSIS — I447 Left bundle-branch block, unspecified: Secondary | ICD-10-CM | POA: Insufficient documentation

## 2014-07-06 DIAGNOSIS — I2510 Atherosclerotic heart disease of native coronary artery without angina pectoris: Secondary | ICD-10-CM

## 2014-07-06 DIAGNOSIS — I2584 Coronary atherosclerosis due to calcified coronary lesion: Secondary | ICD-10-CM

## 2014-07-06 DIAGNOSIS — E785 Hyperlipidemia, unspecified: Secondary | ICD-10-CM

## 2014-07-06 DIAGNOSIS — R079 Chest pain, unspecified: Secondary | ICD-10-CM

## 2014-07-06 MED ORDER — ATORVASTATIN CALCIUM 80 MG PO TABS: 80.0000 mg | ORAL_TABLET | Freq: Every day | ORAL | Status: DC

## 2014-07-06 NOTE — Assessment & Plan Note (Signed)
Known coronary artery disease seen on CT scan. We'll continue aggressive cholesterol management

## 2014-07-06 NOTE — Assessment & Plan Note (Signed)
We will increase her Lipitor up to 80 mg daily. Goal LDL less than 70

## 2014-07-06 NOTE — Progress Notes (Signed)
Patient ID: Tara Sloan, female    DOB: 03-10-1946, 68 y.o.   MRN: [id number]  HPI Comments: Tara Sloan is a very pleasant 68 year old woman with strong family history of coronary artery disease, patient of Dr. Glori Bickers, with PVD and coronary artery disease on CT scan. She does report remote smoking history for less than 10 years  History of large cell non-Hodgkin's lymphoma. She was treated  in 2013 with chemotherapy and radiation treatment. Followup studies including full-body CT scan and PET scan did not show recurrent disease or metastases. CT scan did show coronary artery disease in the LAD, OM, RCA and aorta.  She does report having prior stress test in 2009 which showed no ischemia.  Review of her CT scans from September 2013 in March 2014 shows very mild atherosclerosis/PVD in the aortic arch, descending thoracic aorta. Also with mild CAD any proximal LAD, left circumflex and RCA. Difficult to determine extent given motion artifact. Left main was not well visualized in both CT scans given motion artifact. In general, calcifications seen mild. Otherwise normal size cardiac chambers.  In followup today, she reports having stuttering chest pain symptoms. She is uncertain if this is from GERD. She has not been compliant with her proton pump inhibitor, taking this here and there. Also reports having recent tick bite with an infection, took doxycycline. She denies any symptoms of shortness of breath with exertion. She's otherwise very active. She works out at [employer] on a regular basis with no symptoms.   prior EKG EKG shows normal sinus rhythm with rate 94 beats per minute with no significant ST or T wave changes. EKG today shows normal sinus rhythm with rate 74 beats per minute, left bundle branch block (new)  Father had first MI in his mid 26s. He was a heavy smoker LDL 98, total cholesterol in the 170 range   Outpatient Encounter Prescriptions as of 07/06/2014  Medication Sig  .  acetaminophen (TYLENOL) 325 MG tablet Take 650 mg by mouth every 6 (six) hours as needed. PAIN   . aspirin 81 MG tablet Take 81 mg by mouth every evening.   . Calcium-Vitamin D-Vitamin K 500-100-40 MG-UNT-MCG CHEW Chew 2 tablets by mouth daily.   . Cholecalciferol (VITAMIN D-3) 1000 UNITS CAPS Take 2,000 Units by mouth daily.   . Multiple Vitamin (MULTIVITAMIN) tablet Take 1 tablet by mouth daily.    . naproxen sodium (ANAPROX) 220 MG tablet Take 220 mg by mouth 2 (two) times daily as needed (for pain). PAIN   . omeprazole (PRILOSEC) 20 MG capsule Take 1 capsule (20 mg total) by mouth daily as needed (for heartburn).  Marland Kitchen  atorvastatin (LIPITOR) 80 MG tablet Take 0.5 tablets (40 mg total) by mouth daily.    Review of Systems  Constitutional: Negative.   HENT: Negative.   Eyes: Negative.   Respiratory: Positive for chest tightness.   Cardiovascular: Positive for chest pain.  Gastrointestinal: Negative.        GERD symptoms  Endocrine: Negative.   Musculoskeletal: Negative.   Skin: Negative.   Allergic/Immunologic: Negative.   Neurological: Negative.   Hematological: Negative.   Psychiatric/Behavioral: Negative.   All other systems reviewed and are negative.   BP 134/86  Pulse 74  Ht 5' 3.5" (1.613 m)  Wt 139 lb 8 oz (63.277 kg)  BMI 24.32 kg/m2  Physical Exam  Nursing note and vitals reviewed. Constitutional: She is oriented to person, place, and time. She appears well-developed and well-nourished.  HENT:  Head: Normocephalic.  Nose: Nose normal.  Mouth/Throat: Oropharynx is clear and moist.  Eyes: Conjunctivae are normal. Pupils are equal, round, and reactive to light.  Neck: Normal range of motion. Neck supple. No JVD present.  Cardiovascular: Normal rate, regular rhythm, S1 normal, S2 normal, normal heart sounds and intact distal pulses.  Exam reveals no gallop and no friction rub.   No murmur heard. Pulmonary/Chest: Effort normal and breath sounds normal. No  respiratory distress. She has no wheezes. She has no rales. She exhibits no tenderness.  Abdominal: Soft. Bowel sounds are normal. She exhibits no distension. There is no tenderness.  Musculoskeletal: Normal range of motion. She exhibits no edema and no tenderness.  Lymphadenopathy:    She has no cervical adenopathy.  Neurological: She is alert and oriented to person, place, and time. Coordination normal.  Skin: Skin is warm and dry. No rash noted. No erythema.  Psychiatric: She has a normal mood and affect. Her behavior is normal. Judgment and thought content normal.    Assessment and Plan

## 2014-07-06 NOTE — Assessment & Plan Note (Signed)
New left bundle branch block from her prior clinic visit. Atypical, stuttering chest pain of uncertain etiology, possibly GERD though unable to exclude ischemia. Given the left bundle, we'll avoid treadmill studies and order a pharmacologic Myoview to rule out ischemia.

## 2014-07-06 NOTE — Patient Instructions (Addendum)
  Please increase the lipitor up to 80 mg daily  We will schedule a lexiscan myoview for chest pains and new LBBB  Please call us if you have new issues that need to be addressed before your next appt.  Your physician wants you to follow-up in: 12 months.  You will receive a reminder letter in the mail two months in advance. If you don't receive a letter, please call our office to schedule the follow-up appointment.  [place]  Your caregiver has ordered a Stress Test with nuclear imaging. The purpose of this test is to evaluate the blood supply to your heart muscle. This procedure is referred to as a "Non-Invasive Stress Test." This is because other than having an IV started in your vein, nothing is inserted or "invades" your body. Cardiac stress tests are done to find areas of poor blood flow to the heart by determining the extent of coronary artery disease (CAD). Some patients exercise on a treadmill, which naturally increases the blood flow to your heart, while others who are  unable to walk on a treadmill due to physical limitations have a pharmacologic/chemical stress agent called Lexiscan . This medicine will mimic walking on a treadmill by temporarily increasing your coronary blood flow.   Please note: these test may take anywhere between 2-4 hours to complete  PLEASE REPORT TO [facility] AT THE FIRST DESK WILL DIRECT YOU WHERE TO GO  Date of Procedure:_____Thursday, July 23______  Arrival Time for Procedure:___7:15am__________   PLEASE NOTIFY THE OFFICE AT LEAST 24 HOURS IN ADVANCE IF YOU ARE UNABLE TO KEEP YOUR APPOINTMENT.  [phone] AND  PLEASE NOTIFY NUCLEAR MEDICINE AT [facility] AT LEAST 24 HOURS IN ADVANCE IF YOU ARE UNABLE TO KEEP YOUR APPOINTMENT. [phone]  How to prepare for your Myoview test:  1. Do not eat or drink after midnight 2. No caffeine for 24 hours prior to test 3. No smoking 24 hours prior to test. 4. Your medication  may be taken with water.  If your doctor stopped a medication because of this test, do not take that medication. 5. Ladies, please do not wear dresses.  Skirts or pants are appropriate. Please wear a short sleeve shirt. 6. No perfume, cologne or lotion. 7. Wear comfortable walking shoes. No heels!

## 2014-07-07 ENCOUNTER — Encounter: Payer: Self-pay | Admitting: Family Medicine

## 2014-07-07 ENCOUNTER — Ambulatory Visit (INDEPENDENT_AMBULATORY_CARE_PROVIDER_SITE_OTHER): Payer: Medicare Other | Admitting: Family Medicine

## 2014-07-07 VITALS — BP 112/76 | HR 74 | Temp 98.2°F | Ht 63.5 in | Wt 138.5 lb

## 2014-07-07 DIAGNOSIS — M899 Disorder of bone, unspecified: Secondary | ICD-10-CM

## 2014-07-07 DIAGNOSIS — M949 Disorder of cartilage, unspecified: Principal | ICD-10-CM

## 2014-07-07 MED ORDER — ALENDRONATE SODIUM 70 MG PO TABS: 70.0000 mg | ORAL_TABLET | ORAL | Status: DC

## 2014-07-07 NOTE — Patient Instructions (Signed)
Take your calcium and vitamin D  Keep exercising  Be careful not to fall  Here is some info on fosamax to look at -if you start it alert me if side effects

## 2014-07-07 NOTE — Assessment & Plan Note (Signed)
T score -1.6 at the FN Risk factors incl mod petite frame and also rad/ chemo and current PPI Disc fosamax generic and poss side eff in detail  Given px and info on it Rev ca and D  Will see if she tolerates it - 5 y course if it works out and dexa every 2 years Safety disc as well

## 2014-07-07 NOTE — Progress Notes (Signed)
Subjective:    Patient ID: Tara Sloan, female    DOB: 1946-08-10, 68 y.o.   MRN: [id number]  HPI Here for f/u of recent dexa scan   Hx of osteopenia This score had decreased   T score -1.6 at femoral neck, (same score at spine with scoliosis and osteophytes--which may elevate score) Down about 4% from last time   No family hx of osteoporosis  Is petite  No steroids  No thyroid problems  Is on PPI - that is a risk factor  No broken bones  No falls   She thinks she has lost ht - used to be 5 foot 5 inches    Vit D was very good in the 70s [facility]   Patient Active Problem List   Diagnosis Date Noted  . Left bundle branch block (LBBB) 07/06/2014  . Encounter for Medicare annual wellness exam 03/17/2013  . 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 Pain in joint, ankle and foot 03/16/2013  . Hyperglycemia 12/01/2011  . Diffuse large B cell lymphoma 11/22/2011  . ARTHRITIS, HANDS, BILATERAL 12/12/2010  . HYPERLIPIDEMIA 04/02/2008  . ALLERGIC RHINITIS 04/02/2008  . GERD 04/02/2008  . OSTEOPENIA 09/13/2007  . ALLERGY 08/06/2007   Past Medical History  Diagnosis Date  . HLD (hyperlipidemia)   . Osteopenia   . GERD (gastroesophageal reflux disease)   . Allergic rhinitis   . External hemorrhoid   . Arthritis     osteopenia  . Osteopenia   . History of radiation therapy 03/22/12 - 04/09/12    right oropharynx/neck  . 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 Cancer     nhl   Past Surgical History  Procedure Laterality Date  . Dilation and curettage of uterus  1987  . Partial hysterectomy  1988    fibroids  . Tubal ligation  1987  . Colonoscopy  5/06    Ext. hemms  . Breast biopsy  12/03    Negative  . Tonsillectomy  11/07/2011    Procedure: TONSILLECTOMY;  Surgeon: Rozetta Nunnery, MD;  Location: [facility];  Service: ENT;  Laterality: N/A;   History  Substance Use Topics  .  Smoking status: Former Smoker -- 5 years    Quit date: 11/04/1974  . Smokeless tobacco: Never Used  . Alcohol Use: Yes     Comment: Rare   Family History  Problem Relation Age of Onset  . Heart attack Father 59  . Heart disease Father   . Stroke Mother 12  . Breast cancer Paternal Aunt   . Cancer Paternal Aunt   . Hyperlipidemia      Family history  . Cancer Paternal Aunt    Allergies  Allergen Reactions  . Pnu-Imune [Pneumococcal Polysaccharides] Hives   Current Outpatient Prescriptions on File Prior to Visit  Medication Sig Dispense Refill  . acetaminophen (TYLENOL) 325 MG tablet Take 650 mg by mouth every 6 (six) hours as needed. PAIN       . aspirin 81 MG tablet Take 81 mg by mouth every evening.       Marland Kitchen atorvastatin (LIPITOR) 80 MG tablet Take 1 tablet (80 mg total) by mouth daily.  90 tablet  3  . Calcium-Vitamin D-Vitamin K 500-100-40 MG-UNT-MCG CHEW Chew 2 tablets by mouth daily.       . Cholecalciferol (VITAMIN D-3) 1000 UNITS CAPS Take 2,000 Units by mouth daily.       Marland Kitchen  Multiple Vitamin (MULTIVITAMIN) tablet Take 1 tablet by mouth daily.        . naproxen sodium (ANAPROX) 220 MG tablet Take 220 mg by mouth 2 (two) times daily as needed (for pain). PAIN       . omeprazole (PRILOSEC) 20 MG capsule Take 1 capsule (20 mg total) by mouth daily as needed (for heartburn).  90 capsule  3  . [DISCONTINUED] prochlorperazine (COMPAZINE) 10 MG tablet Take 1 tablet (10 mg total) by mouth every 6 (six) hours as needed (Nausea or vomiting).  30 tablet  6   No current facility-administered medications on file prior to visit.     Review of Systems Review of Systems  Constitutional: Negative for fever, appetite change, fatigue and unexpected weight change.  Eyes: Negative for pain and visual disturbance.  Respiratory: Negative for cough and shortness of breath.   Cardiovascular: Negative for cp or palpitations    Gastrointestinal: Negative for nausea, diarrhea and constipation.    Genitourinary: Negative for urgency and frequency.  Skin: Negative for pallor or rash   Neurological: Negative for weakness, light-headedness, numbness and headaches.  Hematological: Negative for adenopathy. Does not bruise/bleed easily.  Psychiatric/Behavioral: Negative for dysphoric mood. The patient is not nervous/anxious.         Objective:   Physical Exam  Constitutional: She appears well-developed and well-nourished. No distress.  HENT:  Head: Normocephalic and atraumatic.  Eyes: Conjunctivae and EOM are normal. Pupils are equal, round, and reactive to light.  Cardiovascular: Normal rate and regular rhythm.   Pulmonary/Chest: Effort normal and breath sounds normal.  Musculoskeletal: She exhibits no edema and no tenderness.  No acute joint changes   Frame is petite to medium   No kyphosis   Neurological: She is alert. She has normal reflexes.  Skin: Skin is warm and dry. No rash noted. No erythema. No pallor.  Psychiatric: She has a normal mood and affect.          Assessment & Plan:   Problem List Items Addressed This Visit     Musculoskeletal and Integument   OSTEOPENIA - Primary     T score -1.6 at the FN Risk factors incl mod petite frame and also rad/ chemo and current PPI Disc fosamax generic and poss side eff in detail  Given px and info on it Rev ca and D  Will see if she tolerates it - 5 y course if it works out and dexa every 2 years Safety disc as well

## 2014-07-07 NOTE — Progress Notes (Signed)
Pre visit review using our clinic review tool, if applicable. No additional management support is needed unless otherwise documented below in the visit note. 

## 2014-07-13 ENCOUNTER — Ambulatory Visit: Payer: Self-pay | Admitting: Cardiovascular Disease

## 2014-07-13 DIAGNOSIS — R079 Chest pain, unspecified: Secondary | ICD-10-CM

## 2014-07-14 ENCOUNTER — Other Ambulatory Visit: Payer: Self-pay

## 2014-07-14 DIAGNOSIS — R079 Chest pain, unspecified: Secondary | ICD-10-CM

## 2014-07-14 DIAGNOSIS — I2510 Atherosclerotic heart disease of native coronary artery without angina pectoris: Secondary | ICD-10-CM

## 2014-07-14 DIAGNOSIS — I447 Left bundle-branch block, unspecified: Secondary | ICD-10-CM

## 2014-07-14 DIAGNOSIS — I2584 Coronary atherosclerosis due to calcified coronary lesion: Principal | ICD-10-CM

## 2014-07-20 ENCOUNTER — Other Ambulatory Visit: Payer: Self-pay

## 2014-07-20 DIAGNOSIS — R9430 Abnormal result of cardiovascular function study, unspecified: Secondary | ICD-10-CM

## 2014-07-25 ENCOUNTER — Other Ambulatory Visit (INDEPENDENT_AMBULATORY_CARE_PROVIDER_SITE_OTHER): Payer: Medicare Other

## 2014-07-25 ENCOUNTER — Other Ambulatory Visit: Payer: Self-pay

## 2014-07-25 DIAGNOSIS — R079 Chest pain, unspecified: Secondary | ICD-10-CM

## 2014-07-25 DIAGNOSIS — R9430 Abnormal result of cardiovascular function study, unspecified: Secondary | ICD-10-CM

## 2014-07-25 DIAGNOSIS — I2510 Atherosclerotic heart disease of native coronary artery without angina pectoris: Secondary | ICD-10-CM

## 2014-09-04 ENCOUNTER — Other Ambulatory Visit: Payer: Self-pay

## 2014-09-04 DIAGNOSIS — Z1231 Encounter for screening mammogram for malignant neoplasm of breast: Secondary | ICD-10-CM

## 2014-09-08 ENCOUNTER — Other Ambulatory Visit (HOSPITAL_BASED_OUTPATIENT_CLINIC_OR_DEPARTMENT_OTHER): Payer: Medicare Other

## 2014-09-08 ENCOUNTER — Encounter: Payer: Self-pay | Admitting: Hematology and Oncology

## 2014-09-08 ENCOUNTER — Ambulatory Visit (HOSPITAL_BASED_OUTPATIENT_CLINIC_OR_DEPARTMENT_OTHER): Payer: Medicare Other | Admitting: Hematology and Oncology

## 2014-09-08 VITALS — BP 121/68 | HR 71 | Temp 98.6°F | Resp 18 | Ht 63.5 in | Wt 138.6 lb

## 2014-09-08 DIAGNOSIS — I447 Left bundle-branch block, unspecified: Secondary | ICD-10-CM

## 2014-09-08 DIAGNOSIS — C8330 Diffuse large B-cell lymphoma, unspecified site: Secondary | ICD-10-CM

## 2014-09-08 DIAGNOSIS — Z87898 Personal history of other specified conditions: Secondary | ICD-10-CM

## 2014-09-08 LAB — CBC WITH DIFFERENTIAL/PLATELET
BASO%: 0.9 % (ref 0.0–2.0)
Basophils Absolute: 0.1 10*3/uL (ref 0.0–0.1)
EOS%: 1.6 % (ref 0.0–7.0)
Eosinophils Absolute: 0.1 10*3/uL (ref 0.0–0.5)
HCT: 39.1 % (ref 34.8–46.6)
HGB: 12.8 g/dL (ref 11.6–15.9)
LYMPH%: 27 % (ref 14.0–49.7)
MCH: 29.3 pg (ref 25.1–34.0)
MCHC: 32.7 g/dL (ref 31.5–36.0)
MCV: 89.7 fL (ref 79.5–101.0)
MONO#: 0.7 10*3/uL (ref 0.1–0.9)
MONO%: 9 % (ref 0.0–14.0)
NEUT#: 4.6 10*3/uL (ref 1.5–6.5)
NEUT%: 61.5 % (ref 38.4–76.8)
Platelets: 189 10*3/uL (ref 145–400)
RBC: 4.36 10*6/uL (ref 3.70–5.45)
RDW: 14 % (ref 11.2–14.5)
WBC: 7.5 10*3/uL (ref 3.9–10.3)
lymph#: 2 10*3/uL (ref 0.9–3.3)

## 2014-09-08 LAB — COMPREHENSIVE METABOLIC PANEL (CC13)
ALT: 18 U/L (ref 0–55)
AST: 21 U/L (ref 5–34)
Albumin: 3.7 g/dL (ref 3.5–5.0)
Alkaline Phosphatase: 60 U/L (ref 40–150)
Anion Gap: 9 mEq/L (ref 3–11)
BUN: 19.3 mg/dL (ref 7.0–26.0)
CO2: 26 mEq/L (ref 22–29)
Calcium: 9.6 mg/dL (ref 8.4–10.4)
Chloride: 109 mEq/L (ref 98–109)
Creatinine: 0.9 mg/dL (ref 0.6–1.1)
Glucose: 100 mg/dl (ref 70–140)
Potassium: 4 mEq/L (ref 3.5–5.1)
Sodium: 143 mEq/L (ref 136–145)
Total Bilirubin: 0.66 mg/dL (ref 0.20–1.20)
Total Protein: 6.6 g/dL (ref 6.4–8.3)

## 2014-09-08 LAB — LACTATE DEHYDROGENASE (CC13): LDH: 185 U/L (ref 125–245)

## 2014-09-08 NOTE — Progress Notes (Signed)
[facility] OFFICE PROGRESS NOTE  Patient Care Team: Abner Greenspan, MD as PCP - General  SUMMARY OF ONCOLOGIC HISTORY: This is a pleasant lady who was diagnosed with lymphoma. According to the patient, she fell an abnormal lump in the tonsil area that prompted an evaluation around July of 2012. She was first diagnosed with streptococcal infection treated with antibiotics that never went away. 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 She subsequently underwent further staging bone marrow aspirate and biopsy, and was subsequently found to have stage I disease In 2013, she completed 3 cycles of R. CHOP chemotherapy. She had profound fatigue and feeling unwell while on chemotherapy including nausea. After her chemotherapy was completed, she had radiation therapy, completed by April 2013 and she had been placed on observation since then  INTERVAL HISTORY: Please see below for problem oriented charting. She suffer from tick bite recently. EKG was abnormal. Echocardiogram showed mild cardiomyopathy, thought to be related to prior chemotherapy exposure. She is on aspirin in high dose of Lipitor.  REVIEW OF SYSTEMS:   Constitutional: Denies fevers, chills or abnormal weight loss Eyes: Denies blurriness of vision Ears, nose, mouth, throat, and face: Denies mucositis or sore throat Respiratory: Denies cough, dyspnea or wheezes Cardiovascular: Denies palpitation, chest discomfort or lower extremity swelling Gastrointestinal:  Denies nausea, heartburn or change in bowel habits Skin: Denies abnormal skin rashes Lymphatics: Denies new lymphadenopathy or easy bruising Neurological:Denies numbness, tingling or new weaknesses Behavioral/Psych: Mood is stable, no new changes  All other systems were reviewed with the patient and are negative.  I have reviewed the past medical history, past surgical history,  social history and family history with the patient and they are unchanged from previous note.  ALLERGIES:  is allergic to pnu-imune.  MEDICATIONS:  Current Outpatient Prescriptions  Medication Sig Dispense Refill  . acetaminophen (TYLENOL) 325 MG tablet Take 650 mg by mouth every 6 (six) hours as needed. PAIN       . alendronate (FOSAMAX) 70 MG tablet Take 1 tablet (70 mg total) by mouth every 7 (seven) days. Take with a full glass of water on an empty stomach.  4 tablet  11  . aspirin 81 MG tablet Take 81 mg by mouth every evening.       Marland Kitchen atorvastatin (LIPITOR) 80 MG tablet Take 1 tablet (80 mg total) by mouth daily.  90 tablet  3  . Calcium-Vitamin D-Vitamin K 500-100-40 MG-UNT-MCG CHEW Chew 2 tablets by mouth daily.       . Cholecalciferol (VITAMIN D-3) 1000 UNITS CAPS Take 2,000 Units by mouth daily.       . Multiple Vitamin (MULTIVITAMIN) tablet Take 1 tablet by mouth daily.        . naproxen sodium (ANAPROX) 220 MG tablet Take 220 mg by mouth 2 (two) times daily as needed (for pain). PAIN       . omeprazole (PRILOSEC) 20 MG capsule Take 1 capsule (20 mg total) by mouth daily as needed (for heartburn).  90 capsule  3  . [DISCONTINUED] prochlorperazine (COMPAZINE) 10 MG tablet Take 1 tablet (10 mg total) by mouth every 6 (six) hours as needed (Nausea or vomiting).  30 tablet  6   No current facility-administered medications for this visit.    PHYSICAL EXAMINATION: ECOG PERFORMANCE STATUS: 0 - Asymptomatic  Filed Vitals:   09/08/14 1439  BP: 121/68  Pulse: 71  Temp: 98.6 F (  37 C)  Resp: 18   Filed Weights   09/08/14 1439  Weight: 138 lb 9.6 oz (62.869 kg)    GENERAL:alert, no distress and comfortable SKIN: skin color, texture, turgor are normal, no rashes or significant lesions EYES: normal, Conjunctiva are pink and non-injected, sclera clear OROPHARYNX:no exudate, no erythema and lips, buccal mucosa, and tongue normal  NECK: supple, thyroid normal size, non-tender,  without nodularity LYMPH:  no palpable lymphadenopathy in the cervical, axillary or inguinal LUNGS: clear to auscultation and percussion with normal breathing effort HEART: regular rate & rhythm and no murmurs and no lower extremity edema ABDOMEN:abdomen soft, non-tender and normal bowel sounds Musculoskeletal:no cyanosis of digits and no clubbing  NEURO: alert & oriented x 3 with fluent speech, no focal motor/sensory deficits  LABORATORY DATA:  I have reviewed the data as listed    Component Value Date/Time   NA 143 09/08/2014 1416   NA 140 06/19/2014 0945   NA 145 03/03/2012 1201   K 4.0 09/08/2014 1416   K 3.9 06/19/2014 0945   K 3.8 03/03/2012 1201   CL 107 06/19/2014 0945   CL 105 03/08/2013 0851   CL 99 03/03/2012 1201   CO2 26 09/08/2014 1416   CO2 27 06/19/2014 0945   CO2 28 03/03/2012 1201   GLUCOSE 100 09/08/2014 1416   GLUCOSE 99 06/19/2014 0945   GLUCOSE 108* 03/08/2013 0851   GLUCOSE 112 03/03/2012 1201   BUN 19.3 09/08/2014 1416   BUN 22 06/19/2014 0945   BUN 19 03/03/2012 1201   CREATININE 0.9 09/08/2014 1416   CREATININE 0.9 06/19/2014 0945   CREATININE 0.8 03/03/2012 1201   CALCIUM 9.6 09/08/2014 1416   CALCIUM 9.4 06/19/2014 0945   CALCIUM 9.1 03/03/2012 1201   PROT 6.6 09/08/2014 1416   PROT 7.1 06/19/2014 0945   PROT 6.8 03/03/2012 1201   ALBUMIN 3.7 09/08/2014 1416   ALBUMIN 4.1 06/19/2014 0945   AST 21 09/08/2014 1416   AST 30 06/19/2014 0945   AST 50* 03/03/2012 1201   ALT 18 09/08/2014 1416   ALT 21 06/19/2014 0945   ALT 64* 03/03/2012 1201   ALKPHOS 60 09/08/2014 1416   ALKPHOS 57 06/19/2014 0945   ALKPHOS 71 03/03/2012 1201   BILITOT 0.66 09/08/2014 1416   BILITOT 0.9 06/19/2014 0945   BILITOT 0.60 03/03/2012 1201   GFRNONAA 76.49 12/06/2010 0816   GFRAA 82 10/10/2008 1005    No results found for this basename: SPEP,  UPEP,   kappa and lambda light chains    Lab Results  Component Value Date   WBC 7.5 09/08/2014   NEUTROABS 4.6 09/08/2014   HGB 12.8 09/08/2014   HCT  39.1 09/08/2014   MCV 89.7 09/08/2014   PLT 189 09/08/2014      Chemistry      Component Value Date/Time   NA 143 09/08/2014 1416   NA 140 06/19/2014 0945   NA 145 03/03/2012 1201   K 4.0 09/08/2014 1416   K 3.9 06/19/2014 0945   K 3.8 03/03/2012 1201   CL 107 06/19/2014 0945   CL 105 03/08/2013 0851   CL 99 03/03/2012 1201   CO2 26 09/08/2014 1416   CO2 27 06/19/2014 0945   CO2 28 03/03/2012 1201   BUN 19.3 09/08/2014 1416   BUN 22 06/19/2014 0945   BUN 19 03/03/2012 1201   CREATININE 0.9 09/08/2014 1416   CREATININE 0.9 06/19/2014 0945   CREATININE 0.8 03/03/2012 1201  Component Value Date/Time   CALCIUM 9.6 09/08/2014 1416   CALCIUM 9.4 06/19/2014 0945   CALCIUM 9.1 03/03/2012 1201   ALKPHOS 60 09/08/2014 1416   ALKPHOS 57 06/19/2014 0945   ALKPHOS 71 03/03/2012 1201   AST 21 09/08/2014 1416   AST 30 06/19/2014 0945   AST 50* 03/03/2012 1201   ALT 18 09/08/2014 1416   ALT 21 06/19/2014 0945   ALT 64* 03/03/2012 1201   BILITOT 0.66 09/08/2014 1416   BILITOT 0.9 06/19/2014 0945   BILITOT 0.60 03/03/2012 1201      ASSESSMENT & PLAN:  Diffuse large B cell lymphoma Clinically, she has no signs of recurrence. I will see her back in 6 months with history, physical examination and blood work. If she had no signs of recurrence, I will see her back a year from then.  Left bundle branch block (LBBB) Her most recent echocardiogram showed mild cardiomyopathy. She is currently on aspirin and high-dose Lipitor. She will continue a cardiology followup.    Orders Placed This Encounter  Procedures  . CBC with Differential    Standing Status: Future     Number of Occurrences:      Standing Expiration Date: 10/13/2015  . Comprehensive metabolic panel    Standing Status: Future     Number of Occurrences:      Standing Expiration Date: 10/13/2015  . Lactate dehydrogenase    Standing Status: Future     Number of Occurrences:      Standing Expiration Date: 10/13/2015   All questions were answered.  The patient knows to call the clinic with any problems, questions or concerns. No barriers to learning was detected. I spent 15 minutes counseling the patient face to face. The total time spent in the appointment was 20 minutes and more than 50% was on counseling and review of test results     [facility], [place], MD 09/08/2014 3:43 PM

## 2014-09-08 NOTE — Assessment & Plan Note (Signed)
Her most recent echocardiogram showed mild cardiomyopathy. She is currently on aspirin and high-dose Lipitor. She will continue a cardiology followup. 

## 2014-09-08 NOTE — Assessment & Plan Note (Signed)
Clinically, she has no signs of recurrence. I will see her back in 6 months with history, physical examination and blood work. If she had no signs of recurrence, I will see her back a year from then.

## 2014-09-11 ENCOUNTER — Telehealth: Payer: Self-pay | Admitting: Hematology and Oncology

## 2014-09-11 NOTE — Telephone Encounter (Signed)
, °

## 2014-09-14 ENCOUNTER — Ambulatory Visit
Admission: RE | Admit: 2014-09-14 | Discharge: 2014-09-14 | Disposition: A | Discharge status: Home / Self Care | Payer: Medicare Other | Source: Ambulatory Visit

## 2014-09-14 DIAGNOSIS — Z1231 Encounter for screening mammogram for malignant neoplasm of breast: Secondary | ICD-10-CM

## 2014-10-10 ENCOUNTER — Ambulatory Visit (INDEPENDENT_AMBULATORY_CARE_PROVIDER_SITE_OTHER): Payer: Medicare Other

## 2014-10-10 DIAGNOSIS — Z23 Encounter for immunization: Secondary | ICD-10-CM

## 2014-12-07 ENCOUNTER — Other Ambulatory Visit: Payer: Self-pay | Admitting: Nurse Practitioner

## 2015-03-09 ENCOUNTER — Other Ambulatory Visit (HOSPITAL_BASED_OUTPATIENT_CLINIC_OR_DEPARTMENT_OTHER): Payer: Medicare Other

## 2015-03-09 ENCOUNTER — Ambulatory Visit (HOSPITAL_BASED_OUTPATIENT_CLINIC_OR_DEPARTMENT_OTHER): Payer: Medicare Other | Admitting: Hematology and Oncology

## 2015-03-09 ENCOUNTER — Encounter: Payer: Self-pay | Admitting: Hematology and Oncology

## 2015-03-09 VITALS — BP 131/74 | HR 67 | Temp 97.5°F | Resp 18 | Ht 63.5 in | Wt 138.8 lb

## 2015-03-09 DIAGNOSIS — C8331 Diffuse large B-cell lymphoma, lymph nodes of head, face, and neck: Secondary | ICD-10-CM

## 2015-03-09 DIAGNOSIS — I447 Left bundle-branch block, unspecified: Secondary | ICD-10-CM

## 2015-03-09 DIAGNOSIS — C8330 Diffuse large B-cell lymphoma, unspecified site: Secondary | ICD-10-CM

## 2015-03-09 DIAGNOSIS — I429 Cardiomyopathy, unspecified: Secondary | ICD-10-CM

## 2015-03-09 LAB — COMPREHENSIVE METABOLIC PANEL (CC13)
ALT: 29 U/L (ref 0–55)
AST: 29 U/L (ref 5–34)
Albumin: 3.9 g/dL (ref 3.5–5.0)
Alkaline Phosphatase: 49 U/L (ref 40–150)
Anion Gap: 9 mEq/L (ref 3–11)
BUN: 20.9 mg/dL (ref 7.0–26.0)
CO2: 24 mEq/L (ref 22–29)
Calcium: 8.8 mg/dL (ref 8.4–10.4)
Chloride: 110 mEq/L — ABNORMAL HIGH (ref 98–109)
Creatinine: 0.8 mg/dL (ref 0.6–1.1)
EGFR: 71 mL/min/{1.73_m2} — ABNORMAL LOW (ref >90)
Glucose: 95 mg/dl (ref 70–140)
Potassium: 3.8 mEq/L (ref 3.5–5.1)
Sodium: 143 mEq/L (ref 136–145)
Total Bilirubin: 0.76 mg/dL (ref 0.20–1.20)
Total Protein: 6.4 g/dL (ref 6.4–8.3)

## 2015-03-09 LAB — CBC WITH DIFFERENTIAL/PLATELET
BASO%: 0.7 % (ref 0.0–2.0)
Basophils Absolute: 0.1 10*3/uL (ref 0.0–0.1)
EOS%: 1.3 % (ref 0.0–7.0)
Eosinophils Absolute: 0.1 10*3/uL (ref 0.0–0.5)
HCT: 39.2 % (ref 34.8–46.6)
HGB: 12.6 g/dL (ref 11.6–15.9)
LYMPH%: 29.3 % (ref 14.0–49.7)
MCH: 29.4 pg (ref 25.1–34.0)
MCHC: 32.3 g/dL (ref 31.5–36.0)
MCV: 91.1 fL (ref 79.5–101.0)
MONO#: 0.7 10*3/uL (ref 0.1–0.9)
MONO%: 9.5 % (ref 0.0–14.0)
NEUT#: 4.4 10*3/uL (ref 1.5–6.5)
NEUT%: 59.2 % (ref 38.4–76.8)
Platelets: 199 10*3/uL (ref 145–400)
RBC: 4.3 10*6/uL (ref 3.70–5.45)
RDW: 13.6 % (ref 11.2–14.5)
WBC: 7.4 10*3/uL (ref 3.9–10.3)
lymph#: 2.2 10*3/uL (ref 0.9–3.3)

## 2015-03-09 LAB — LACTATE DEHYDROGENASE (CC13): LDH: 193 U/L (ref 125–245)

## 2015-03-09 NOTE — Progress Notes (Signed)
[facility] OFFICE PROGRESS NOTE  Patient Care Team: Abner Greenspan, MD as PCP - General  SUMMARY OF ONCOLOGIC HISTORY:  This is a pleasant lady who was diagnosed with lymphoma. According to the patient, she fell an abnormal lump in the tonsil area that prompted an evaluation around July of 2012. She was first diagnosed with streptococcal infection treated with antibiotics that never went away. 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 She subsequently underwent further staging bone marrow aspirate and biopsy, and was subsequently found to have stage I disease In 2013, she completed 3 cycles of R. CHOP chemotherapy. She had profound fatigue and feeling unwell while on chemotherapy including nausea. 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 She denies new lymphadenopathy.  No new sensation of sore throat. No signs and symptoms of congestive heart failure such as shortness of breath, chest discomfort or leg swelling  REVIEW OF SYSTEMS:   Constitutional: Denies fevers, chills or abnormal weight loss Eyes: Denies blurriness of vision Ears, nose, mouth, throat, and face: Denies mucositis or sore throat Respiratory: Denies cough, dyspnea or wheezes Cardiovascular: Denies palpitation, chest discomfort or lower extremity swelling Gastrointestinal:  Denies nausea, heartburn or change in bowel habits Skin: Denies abnormal skin rashes Lymphatics: Denies new lymphadenopathy or easy bruising Neurological:Denies numbness, tingling or new weaknesses Behavioral/Psych: Mood is stable, no new changes  All other systems were reviewed with the patient and are  negative.  I have reviewed the past medical history, past surgical history, social history and family history with the patient and they are unchanged from previous note.  ALLERGIES:  is allergic to pnu-imune.  MEDICATIONS:  Current Outpatient Prescriptions  Medication Sig Dispense Refill  . acetaminophen (TYLENOL) 325 MG tablet Take 650 mg by mouth every 6 (six) hours as needed. PAIN     . alendronate (FOSAMAX) 70 MG tablet Take 1 tablet (70 mg total) by mouth every 7 (seven) days. Take with a full glass of water on an empty stomach. 4 tablet 11  . aspirin 81 MG tablet Take 81 mg by mouth every evening.     Marland Kitchen atorvastatin (LIPITOR) 80 MG tablet Take 1 tablet (80 mg total) by mouth daily. 90 tablet 3  . Calcium-Vitamin D-Vitamin K 500-100-40 MG-UNT-MCG CHEW Chew 2 tablets by mouth daily.     . Cholecalciferol (VITAMIN D-3) 1000 UNITS CAPS Take 2,000 Units by mouth daily.     . Multiple Vitamin (MULTIVITAMIN) tablet Take 1 tablet by mouth daily.      . naproxen sodium (ANAPROX) 220 MG tablet Take 220 mg by mouth 2 (two) times daily as needed (for pain). PAIN     . omeprazole (PRILOSEC) 20 MG capsule Take 1 capsule (20 mg total) by mouth daily as needed (for heartburn). 90 capsule 3  . [DISCONTINUED] prochlorperazine (COMPAZINE) 10 MG tablet Take 1 tablet (10 mg total) by mouth every 6 (six) hours as needed (Nausea or vomiting). 30 tablet 6   No current facility-administered medications for this visit.    PHYSICAL EXAMINATION: ECOG PERFORMANCE STATUS: 0 - Asymptomatic  Filed Vitals:   03/09/15 1507  BP: 131/74  Pulse: 67  Temp: 97.5 F (36.4 C)  Resp: 18   Filed  Weights   03/09/15 1507  Weight: 138 lb 12.8 oz (62.959 kg)    GENERAL:alert, no distress and comfortable SKIN: skin color, texture, turgor are normal, no rashes or significant lesions EYES: normal, Conjunctiva are pink and non-injected, sclera clear OROPHARYNX:no exudate, no erythema and lips, buccal mucosa, and  tongue normal  NECK: supple, thyroid normal size, non-tender, without nodularity LYMPH:  no palpable lymphadenopathy in the cervical, axillary or inguinal LUNGS: clear to auscultation and percussion with normal breathing effort HEART: regular rate & rhythm and no murmurs and no lower extremity edema ABDOMEN:abdomen soft, non-tender and normal bowel sounds Musculoskeletal:no cyanosis of digits and no clubbing  NEURO: alert & oriented x 3 with fluent speech, no focal motor/sensory deficits  LABORATORY DATA:  I have reviewed the data as listed    Component Value Date/Time   NA 143 09/08/2014 1416   NA 140 06/19/2014 0945   NA 145 03/03/2012 1201   K 4.0 09/08/2014 1416   K 3.9 06/19/2014 0945   K 3.8 03/03/2012 1201   CL 107 06/19/2014 0945   CL 105 03/08/2013 0851   CL 99 03/03/2012 1201   CO2 26 09/08/2014 1416   CO2 27 06/19/2014 0945   CO2 28 03/03/2012 1201   GLUCOSE 100 09/08/2014 1416   GLUCOSE 99 06/19/2014 0945   GLUCOSE 108* 03/08/2013 0851   GLUCOSE 112 03/03/2012 1201   BUN 19.3 09/08/2014 1416   BUN 22 06/19/2014 0945   BUN 19 03/03/2012 1201   CREATININE 0.9 09/08/2014 1416   CREATININE 0.9 06/19/2014 0945   CREATININE 0.8 03/03/2012 1201   CALCIUM 9.6 09/08/2014 1416   CALCIUM 9.4 06/19/2014 0945   CALCIUM 9.1 03/03/2012 1201   PROT 6.6 09/08/2014 1416   PROT 7.1 06/19/2014 0945   PROT 6.8 03/03/2012 1201   ALBUMIN 3.7 09/08/2014 1416   ALBUMIN 4.1 06/19/2014 0945   AST 21 09/08/2014 1416   AST 30 06/19/2014 0945   AST 50* 03/03/2012 1201   ALT 18 09/08/2014 1416   ALT 21 06/19/2014 0945   ALT 64* 03/03/2012 1201   ALKPHOS 60 09/08/2014 1416   ALKPHOS 57 06/19/2014 0945   ALKPHOS 71 03/03/2012 1201   BILITOT 0.66 09/08/2014 1416   BILITOT 0.9 06/19/2014 0945   BILITOT 0.60 03/03/2012 1201   GFRNONAA 76.49 12/06/2010 0816   GFRAA 82 10/10/2008 1005    No results found for: SPEP, UPEP  Lab Results  Component Value Date   WBC 7.4 03/09/2015    NEUTROABS 4.4 03/09/2015   HGB 12.6 03/09/2015   HCT 39.2 03/09/2015   MCV 91.1 03/09/2015   PLT 199 03/09/2015      Chemistry      Component Value Date/Time   NA 143 09/08/2014 1416   NA 140 06/19/2014 0945   NA 145 03/03/2012 1201   K 4.0 09/08/2014 1416   K 3.9 06/19/2014 0945   K 3.8 03/03/2012 1201   CL 107 06/19/2014 0945   CL 105 03/08/2013 0851   CL 99 03/03/2012 1201   CO2 26 09/08/2014 1416   CO2 27 06/19/2014 0945   CO2 28 03/03/2012 1201   BUN 19.3 09/08/2014 1416   BUN 22 06/19/2014 0945   BUN 19 03/03/2012 1201   CREATININE 0.9 09/08/2014 1416   CREATININE 0.9 06/19/2014 0945   CREATININE 0.8 03/03/2012 1201      Component Value Date/Time   CALCIUM 9.6 09/08/2014 1416   CALCIUM 9.4 06/19/2014 0945   CALCIUM 9.1 03/03/2012  1201   ALKPHOS 60 09/08/2014 1416   ALKPHOS 57 06/19/2014 0945   ALKPHOS 71 03/03/2012 1201   AST 21 09/08/2014 1416   AST 30 06/19/2014 0945   AST 50* 03/03/2012 1201   ALT 18 09/08/2014 1416   ALT 21 06/19/2014 0945   ALT 64* 03/03/2012 1201   BILITOT 0.66 09/08/2014 1416   BILITOT 0.9 06/19/2014 0945   BILITOT 0.60 03/03/2012 1201      ASSESSMENT & PLAN:  Diffuse large B cell lymphoma Clinically, she has no signs of recurrence. I will see her a year from now with history, physical examination and blood work.     Left bundle branch block (LBBB) Her most recent echocardiogram showed mild cardiomyopathy. She is currently on aspirin and high-dose Lipitor. She will continue a cardiology followup.    Orders Placed This Encounter  Procedures  . CBC with Differential/Platelet    Standing Status: Future     Number of Occurrences:      Standing Expiration Date: 04/12/2016  . Comprehensive metabolic panel    Standing Status: Future     Number of Occurrences:      Standing Expiration Date: 04/12/2016  . Lactate dehydrogenase    Standing Status: Future     Number of Occurrences:      Standing Expiration Date: 04/12/2016    All questions were answered. The patient knows to call the clinic with any problems, questions or concerns. No barriers to learning was detected. I spent 15 minutes counseling the patient face to face. The total time spent in the appointment was 20 minutes and more than 50% was on counseling and review of test results     [facility], [place], MD 03/09/2015 3:29 PM

## 2015-03-09 NOTE — Assessment & Plan Note (Signed)
Her most recent echocardiogram showed mild cardiomyopathy. She is currently on aspirin and high-dose Lipitor. She will continue a cardiology followup.

## 2015-03-09 NOTE — Assessment & Plan Note (Signed)
Clinically, she has no signs of recurrence. I will see her a year from now with history, physical examination and blood work.

## 2015-03-11 ENCOUNTER — Emergency Department (HOSPITAL_COMMUNITY): Payer: Medicare Other

## 2015-03-11 ENCOUNTER — Observation Stay (HOSPITAL_COMMUNITY)
Admission: EM | Admit: 2015-03-11 | Discharge: 2015-03-12 | Disposition: A | Discharge status: Home / Self Care | Payer: Medicare Other | Attending: Internal Medicine | Admitting: Internal Medicine

## 2015-03-11 ENCOUNTER — Other Ambulatory Visit: Payer: Self-pay

## 2015-03-11 ENCOUNTER — Encounter (HOSPITAL_COMMUNITY): Payer: Self-pay | Admitting: *Deleted

## 2015-03-11 DIAGNOSIS — Z87891 Personal history of nicotine dependence: Secondary | ICD-10-CM | POA: Insufficient documentation

## 2015-03-11 DIAGNOSIS — Z79899 Other long term (current) drug therapy: Secondary | ICD-10-CM | POA: Diagnosis not present

## 2015-03-11 DIAGNOSIS — M8580 Other specified disorders of bone density and structure, unspecified site: Secondary | ICD-10-CM | POA: Insufficient documentation

## 2015-03-11 DIAGNOSIS — K219 Gastro-esophageal reflux disease without esophagitis: Secondary | ICD-10-CM | POA: Diagnosis present

## 2015-03-11 DIAGNOSIS — Z8572 Personal history of non-Hodgkin lymphomas: Secondary | ICD-10-CM | POA: Diagnosis not present

## 2015-03-11 DIAGNOSIS — E785 Hyperlipidemia, unspecified: Secondary | ICD-10-CM | POA: Diagnosis not present

## 2015-03-11 DIAGNOSIS — C8330 Diffuse large B-cell lymphoma, unspecified site: Secondary | ICD-10-CM

## 2015-03-11 DIAGNOSIS — K648 Other hemorrhoids: Secondary | ICD-10-CM | POA: Insufficient documentation

## 2015-03-11 DIAGNOSIS — M1990 Unspecified osteoarthritis, unspecified site: Secondary | ICD-10-CM | POA: Insufficient documentation

## 2015-03-11 DIAGNOSIS — R55 Syncope and collapse: Secondary | ICD-10-CM

## 2015-03-11 DIAGNOSIS — R531 Weakness: Secondary | ICD-10-CM | POA: Diagnosis present

## 2015-03-11 DIAGNOSIS — Z7982 Long term (current) use of aspirin: Secondary | ICD-10-CM | POA: Diagnosis not present

## 2015-03-11 DIAGNOSIS — G8929 Other chronic pain: Secondary | ICD-10-CM | POA: Insufficient documentation

## 2015-03-11 DIAGNOSIS — Z85828 Personal history of other malignant neoplasm of skin: Secondary | ICD-10-CM | POA: Diagnosis not present

## 2015-03-11 DIAGNOSIS — R079 Chest pain, unspecified: Secondary | ICD-10-CM | POA: Diagnosis not present

## 2015-03-11 HISTORY — DX: Left bundle-branch block, unspecified: I44.7

## 2015-03-11 HISTORY — DX: Dorsalgia, unspecified: M54.9

## 2015-03-11 HISTORY — DX: Basal cell carcinoma of skin, unspecified: C44.91

## 2015-03-11 HISTORY — DX: Non-Hodgkin lymphoma, unspecified, unspecified site: C85.90

## 2015-03-11 HISTORY — DX: Other chronic pain: G89.29

## 2015-03-11 LAB — CBC
HCT: 39.7 % (ref 36.0–46.0)
HCT: 40.6 % (ref 36.0–46.0)
Hemoglobin: 13.3 g/dL (ref 12.0–15.0)
Hemoglobin: 13.5 g/dL (ref 12.0–15.0)
MCH: 30.4 pg (ref 26.0–34.0)
MCH: 29.9 pg (ref 26.0–34.0)
MCHC: 33.5 g/dL (ref 30.0–36.0)
MCHC: 33.3 g/dL (ref 30.0–36.0)
MCV: 90.6 fL (ref 78.0–100.0)
MCV: 90 fL (ref 78.0–100.0)
Platelets: 170 10*3/uL (ref 150–400)
Platelets: 186 10*3/uL (ref 150–400)
RBC: 4.38 MIL/uL (ref 3.87–5.11)
RBC: 4.51 MIL/uL (ref 3.87–5.11)
RDW: 13.8 % (ref 11.5–15.5)
RDW: 13.9 % (ref 11.5–15.5)
WBC: 6.8 10*3/uL (ref 4.0–10.5)
WBC: 6.1 10*3/uL (ref 4.0–10.5)

## 2015-03-11 LAB — BASIC METABOLIC PANEL
Anion gap: 9 (ref 5–15)
BUN: 20 mg/dL (ref 6–23)
CO2: 24 mmol/L (ref 19–32)
Calcium: 9.3 mg/dL (ref 8.4–10.5)
Chloride: 108 mmol/L (ref 96–112)
Creatinine, Ser: 0.91 mg/dL (ref 0.50–1.10)
GFR calc Af Amer: 73 mL/min — ABNORMAL LOW (ref >90)
GFR calc non Af Amer: 63 mL/min — ABNORMAL LOW (ref >90)
Glucose, Bld: 109 mg/dL — ABNORMAL HIGH (ref 70–99)
Potassium: 3.8 mmol/L (ref 3.5–5.1)
Sodium: 141 mmol/L (ref 135–145)

## 2015-03-11 LAB — CREATININE, SERUM
Creatinine, Ser: 0.89 mg/dL (ref 0.50–1.10)
GFR calc Af Amer: 75 mL/min — ABNORMAL LOW (ref >90)
GFR calc non Af Amer: 65 mL/min — ABNORMAL LOW (ref >90)

## 2015-03-11 LAB — I-STAT TROPONIN, ED: Troponin i, poc: 0 ng/mL (ref 0.00–0.08)

## 2015-03-11 LAB — D-DIMER, QUANTITATIVE (NOT AT ARMC): D-Dimer, Quant: <0.27 ug/mL-FEU (ref 0.00–0.48)

## 2015-03-11 LAB — TROPONIN I: Troponin I: <0.03 ng/mL (ref <0.031)

## 2015-03-11 MED ORDER — ENOXAPARIN SODIUM 40 MG/0.4ML ~~LOC~~ SOLN
40.0000 mg | SUBCUTANEOUS | Status: DC
Start: 2015-03-11 — End: 2015-03-12
  Filled 2015-03-11: qty 0.4

## 2015-03-11 MED ORDER — ALPRAZOLAM 0.25 MG PO TABS: 0.2500 mg | ORAL_TABLET | Freq: Two times a day (BID) | ORAL | Status: DC | PRN

## 2015-03-11 MED ORDER — ASPIRIN 81 MG PO TABS
325.0000 mg | ORAL_TABLET | Freq: Every evening | ORAL | Status: DC
  Filled 2015-03-11 (×2): qty 4

## 2015-03-11 MED ORDER — PANTOPRAZOLE SODIUM 40 MG PO TBEC
40.0000 mg | DELAYED_RELEASE_TABLET | Freq: Every day | ORAL | Status: DC
  Administered 2015-03-12: 40 mg via ORAL
  Filled 2015-03-11: qty 1

## 2015-03-11 MED ORDER — ONE-DAILY MULTI VITAMINS PO TABS: 1.0000 | ORAL_TABLET | Freq: Every day | ORAL | Status: DC

## 2015-03-11 MED ORDER — VITAMIN D-3 25 MCG (1000 UT) PO CAPS: 2000.0000 [IU] | ORAL_CAPSULE | Freq: Every day | ORAL | Status: DC

## 2015-03-11 MED ORDER — GI COCKTAIL ~~LOC~~: 30.0000 mL | Freq: Four times a day (QID) | ORAL | Status: DC | PRN

## 2015-03-11 MED ORDER — CALCIUM CARBONATE-VITAMIN D 500-200 MG-UNIT PO TABS
2.0000 | ORAL_TABLET | Freq: Every day | ORAL | Status: DC
  Administered 2015-03-12: 2 via ORAL
  Filled 2015-03-11 (×3): qty 2

## 2015-03-11 MED ORDER — CALCIUM-VITAMIN D-VITAMIN K 500-100-40 MG-UNT-MCG PO CHEW: 2.0000 | CHEWABLE_TABLET | Freq: Every day | ORAL | Status: DC

## 2015-03-11 MED ORDER — ADULT MULTIVITAMIN W/MINERALS CH
1.0000 | ORAL_TABLET | Freq: Every day | ORAL | Status: DC
  Administered 2015-03-12: 1 via ORAL
  Filled 2015-03-11: qty 1

## 2015-03-11 MED ORDER — ONDANSETRON HCL 4 MG/2ML IJ SOLN: 4.0000 mg | Freq: Four times a day (QID) | INTRAMUSCULAR | Status: DC | PRN

## 2015-03-11 MED ORDER — ACETAMINOPHEN 325 MG PO TABS: 650.0000 mg | ORAL_TABLET | ORAL | Status: DC | PRN

## 2015-03-11 MED ORDER — MORPHINE SULFATE 2 MG/ML IJ SOLN: 1.0000 mg | INTRAMUSCULAR | Status: DC | PRN

## 2015-03-11 MED ORDER — VITAMIN D 1000 UNITS PO TABS
2000.0000 [IU] | ORAL_TABLET | Freq: Every day | ORAL | Status: DC
  Administered 2015-03-12: 2000 [IU] via ORAL
  Filled 2015-03-11: qty 2

## 2015-03-11 MED ORDER — ATORVASTATIN CALCIUM 80 MG PO TABS
80.0000 mg | ORAL_TABLET | Freq: Every day | ORAL | Status: DC
  Administered 2015-03-12: 80 mg via ORAL
  Filled 2015-03-11 (×2): qty 1

## 2015-03-11 NOTE — ED Provider Notes (Signed)
CSN: [id number]     Arrival date & time 03/11/15  1055 History   First MD Initiated Contact with Patient 03/11/15 1127     Chief Complaint  Patient presents with  . Weakness     (Consider location/radiation/quality/duration/timing/severity/associated sxs/prior Treatment) HPI The patient was at church and a seated position, which she had been for while, when she got a wave of heavy weakness that went through her body. She reports that she had heavy chest pressure at that time. She did feel slightly nauseated and mildly short of breath. She denies that she was sweaty or clammy. The patient reports she felt that if she stood up she would pass out. She did not have a loss of consciousness. After about 30 minutes of the symptoms, she felt that they were easing enough that she would be able to leave the area and get some help. At that point she was able to stand and go to the back office. She denies she's had a similar episode. She reports she's been very healthy recently without any symptoms. She reports having had a cardiac stress test in July that did not show any acute findings. She does not have a personal history of PE or DVT. Past Medical History  Diagnosis Date  . HLD (hyperlipidemia)   . Osteopenia   . GERD (gastroesophageal reflux disease)   . Allergic rhinitis   . External hemorrhoid   . Arthritis     osteopenia  . Osteopenia   . History of radiation therapy 03/22/12 - 04/09/12    right oropharynx/neck  . 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 Cancer     nhl   Past Surgical History  Procedure Laterality Date  . Dilation and curettage of uterus  1987  . Partial hysterectomy  1988    fibroids  . Tubal ligation  1987  . Colonoscopy  5/06    Ext. hemms  . Breast biopsy  12/03    Negative  . Tonsillectomy  11/07/2011    Procedure: TONSILLECTOMY;  Surgeon: Rozetta Nunnery, MD;  Location: [place];  Service: ENT;  Laterality: N/A;    Family History  Problem Relation Age of Onset  . Heart attack Father 68  . Heart disease Father   . Stroke Mother 18  . Breast cancer Paternal Aunt   . Cancer Paternal Aunt   . Hyperlipidemia      Family history  . Cancer Paternal Aunt    History  Substance Use Topics  . Smoking status: Former Smoker -- 5 years    Quit date: 11/04/1974  . Smokeless tobacco: Never Used  . Alcohol Use: Yes     Comment: Rare   OB History    No data available     Review of Systems  10 Systems reviewed and are negative for acute change except as noted in the HPI.   Allergies  Pnu-imune  Home Medications   Prior to Admission medications   Medication Sig Start Date End Date Taking? Authorizing Provider  acetaminophen (TYLENOL) 500 MG tablet Take 1,000 mg by mouth every 8 (eight) hours as needed for mild pain or headache.   Yes Historical Provider, MD  alendronate (FOSAMAX) 70 MG tablet Take 1 tablet (70 mg total) by mouth every 7 (seven) days. Take with a full glass of water on an empty stomach. 07/07/14  Yes Abner Greenspan, MD  aspirin 81 MG tablet Take 81 mg  by mouth every evening.    Yes Historical Provider, MD  atorvastatin (LIPITOR) 80 MG tablet Take 1 tablet (80 mg total) by mouth daily. 07/06/14  Yes Minna Merritts, MD  Calcium-Vitamin D-Vitamin K 500-100-40 MG-UNT-MCG CHEW Chew 2 tablets by mouth daily.    Yes Historical Provider, MD  Cholecalciferol (VITAMIN D-3) 1000 UNITS CAPS Take 2,000 Units by mouth daily.    Yes Historical Provider, MD  Multiple Vitamin (MULTIVITAMIN) tablet Take 1 tablet by mouth daily.     Yes Historical Provider, MD  naproxen sodium (ANAPROX) 220 MG tablet Take 220 mg by mouth 2 (two) times daily as needed (for pain). PAIN    Yes Historical Provider, MD  omeprazole (PRILOSEC) 20 MG capsule Take 1 capsule (20 mg total) by mouth daily as needed (for heartburn). 06/27/14  Yes Marne A Tower, MD   BP 138/61 mmHg  Pulse 72  Temp(Src) 98.6 F (37 C) (Oral)  Resp  13  SpO2 99% Physical Exam  Constitutional: She is oriented to person, place, and time. She appears well-developed and well-nourished.  HENT:  Head: Normocephalic and atraumatic.  Eyes: EOM are normal. Pupils are equal, round, and reactive to light.  Neck: Neck supple.  Cardiovascular: Normal rate, regular rhythm, normal heart sounds and intact distal pulses.   Pulmonary/Chest: Effort normal and breath sounds normal.  Abdominal: Soft. Bowel sounds are normal. She exhibits no distension. There is no tenderness.  Musculoskeletal: Normal range of motion. She exhibits no edema.  Neurological: She is alert and oriented to person, place, and time. She has normal strength. Coordination normal. GCS eye subscore is 4. GCS verbal subscore is 5. GCS motor subscore is 6.  Skin: Skin is warm, dry and intact.  Psychiatric: She has a normal mood and affect.    ED Course  Procedures (including critical care time) Labs Review Labs Reviewed  BASIC METABOLIC PANEL - Abnormal; Notable for the following:    Glucose, Bld 109 (*)    GFR calc non Af Amer 63 (*)    GFR calc Af Amer 73 (*)    All other components within normal limits  CBC  D-DIMER, QUANTITATIVE  I-STAT TROPOININ, ED    Imaging Review Dg Chest 2 View  03/11/2015   CLINICAL DATA:  Weakness  EXAM: CHEST  2 VIEW  COMPARISON:  None.  FINDINGS: The heart size and mediastinal contours are within normal limits. Both lungs are clear. The visualized skeletal structures are unremarkable.  IMPRESSION: No active cardiopulmonary disease.   Electronically Signed   By: Inez Catalina M.D.   On: 03/11/2015 11:45     EKG Interpretation   Date/Time:  Sunday March 11 2015 11:01:25 EDT Ventricular Rate:  82 PR Interval:  146 QRS Duration: 140 QT Interval:  424 QTC Calculation: 495 R Axis:   66 Text Interpretation:  Normal sinus rhythm Left bundle branch block  Abnormal ECG Confirmed by Johnney Killian, MD, Jeannie Done [phone]) on 03/11/2015 2:25:56  PM     Case  reviewed with cardiology: Dr. Radford Pax. At this time she recommends admission to the hospitalist for observation. MDM   Final diagnoses:  Chest pain, unspecified chest pain type  Near syncope       Charlesetta Shanks, MD 03/11/15 1506

## 2015-03-11 NOTE — H&P (Signed)
PATIENT DETAILS Name: Tara Sloan Age: 69 y.o. Sex: female Date of Birth: 07-31-1946 Admit Date: 03/11/2015 BZJ:IRCVE Tower, MD   CHIEF COMPLAINT:  Chest pain, palpitations and presyncope earlier today  HPI: Tara Sloan is a 69 y.o. female with a Past Medical History of large B-cell lymphoma, hyperlipidemia who presents today with the above noted complaint. Patient was at church earlier this morning when she had a spell of "generalized weakness" all of a sudden while sitting down-she thinks she almost had a presyncopal episode during this event. This lasted for a few seconds. This was followed by retrosternal discomfort which she describes as heaviness. There was no radiation of the chest pain. There was associated palpitations. But no associated nausea vomiting or diaphoresis. Her symptoms lasted for around 20 minutes. She was then brought to the emergency room for further evaluation and treatment. Initial enzymes were negative, she has a known left bundle branch block. During my evaluation, patient was essentially symptom free. She was no longer having any of her presenting symptoms. History of headache, fever, nausea, vomiting, diarrhea abdominal pain  She denies any dysuria as well   ALLERGIES:   Allergies  Allergen Reactions  . Pnu-Imune [Pneumococcal Polysaccharide Vaccine] Hives    PAST MEDICAL HISTORY: Past Medical History  Diagnosis Date  . HLD (hyperlipidemia)   . Osteopenia   . GERD (gastroesophageal reflux disease)   . Allergic rhinitis   . External hemorrhoid   . Arthritis     osteopenia  . Osteopenia   . History of radiation therapy 03/22/12 - 04/09/12    right oropharynx/neck  . 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 Cancer     nhl    PAST SURGICAL HISTORY: Past Surgical History  Procedure Laterality Date  . Dilation and curettage of uterus  1987  . Partial hysterectomy  1988    fibroids  . Tubal  ligation  1987  . Colonoscopy  5/06    Ext. hemms  . Breast biopsy  12/03    Negative  . Tonsillectomy  11/07/2011    Procedure: TONSILLECTOMY;  Surgeon: Rozetta Nunnery, MD;  Location: [place];  Service: ENT;  Laterality: N/A;    MEDICATIONS AT HOME: Prior to Admission medications   Medication Sig Start Date End Date Taking? Authorizing Provider  acetaminophen (TYLENOL) 500 MG tablet Take 1,000 mg by mouth every 8 (eight) hours as needed for mild pain or headache.   Yes Historical Provider, MD  alendronate (FOSAMAX) 70 MG tablet Take 1 tablet (70 mg total) by mouth every 7 (seven) days. Take with a full glass of water on an empty stomach. 07/07/14  Yes Abner Greenspan, MD  aspirin 81 MG tablet Take 81 mg by mouth every evening.    Yes Historical Provider, MD  atorvastatin (LIPITOR) 80 MG tablet Take 1 tablet (80 mg total) by mouth daily. 07/06/14  Yes Minna Merritts, MD  Calcium-Vitamin D-Vitamin K 500-100-40 MG-UNT-MCG CHEW Chew 2 tablets by mouth daily.    Yes Historical Provider, MD  Cholecalciferol (VITAMIN D-3) 1000 UNITS CAPS Take 2,000 Units by mouth daily.    Yes Historical Provider, MD  Multiple Vitamin (MULTIVITAMIN) tablet Take 1 tablet by mouth daily.     Yes Historical Provider, MD  naproxen sodium (ANAPROX) 220 MG tablet Take 220 mg by mouth 2 (two) times daily as needed (for pain). PAIN  Yes Historical Provider, MD  omeprazole (PRILOSEC) 20 MG capsule Take 1 capsule (20 mg total) by mouth daily as needed (for heartburn). 06/27/14  Yes Abner Greenspan, MD    FAMILY HISTORY: Family History  Problem Relation Age of Onset  . Heart attack Father 75  . Heart disease Father   . Stroke Mother 44  . Breast cancer Paternal Aunt   . Cancer Paternal Aunt   . Hyperlipidemia      Family history  . Cancer Paternal Aunt     SOCIAL HISTORY:  reports that she quit smoking about 40 years ago. She has never used smokeless tobacco. She reports that she drinks  alcohol. She reports that she does not use illicit drugs.  REVIEW OF SYSTEMS:  Constitutional:   No  weight loss, night sweats,  Fevers, chills, fatigue.  HEENT:    No headaches, Difficulty swallowing,Tooth/dental problems,Sore throat  Cardio-vascular: No chest pain,  Orthopnea, PND, swelling in lower extremities, anasarca,  dizziness, palpitations  GI:  No heartburn, indigestion, abdominal pain, nausea, vomiting, diarrhea, change in bowel habits, loss of appetite  Resp: No shortness of breath with exertion or at rest.  No excess mucus, no productive cough, No non-productive cough,  No coughing up of blood.No change in color of mucus.No wheezing.No chest wall deformity  Skin:  no rash or lesions.  GU:  no dysuria, change in color of urine, no urgency or frequency.  No flank pain.  Musculoskeletal: No joint pain or swelling.  No decreased range of motion.  No back pain.  Psych: No change in mood or affect. No depression or anxiety.  No memory loss.   PHYSICAL EXAM: Blood pressure 159/74, pulse 84, temperature 98.6 F (37 C), temperature source Oral, resp. rate 22, SpO2 99 %.  General appearance :Awake, alert, not in any distress. Speech Clear. Not toxic Looking HEENT: Atraumatic and Normocephalic, pupils equally reactive to light and accomodation Neck: supple, no JVD. No cervical lymphadenopathy.  Chest:Good air entry bilaterally, no added sounds  CVS: S1 S2 regular, no murmurs.  Abdomen: Bowel sounds present, Non tender and not distended with no gaurding, rigidity or rebound. Extremities: B/L Lower Ext shows no edema, both legs are warm to touch Neurology: Awake alert, and oriented X 3, CN II-XII intact, Non focal Skin:No Rash Wounds:N/A  LABS ON ADMISSION:   Recent Labs  03/09/15 1457 03/11/15 1203  NA 143 141  K 3.8 3.8  CL  --  108  CO2 24 24  GLUCOSE 95 109*  BUN 20.9 20  CREATININE 0.8 0.91  CALCIUM 8.8 9.3    Recent Labs  03/09/15 1457  AST 29    ALT 29  ALKPHOS 49  BILITOT 0.76  PROT 6.4  ALBUMIN 3.9   No results for input(s): LIPASE, AMYLASE in the last 72 hours.  Recent Labs  03/09/15 1456 03/11/15 1203  WBC 7.4 6.8  NEUTROABS 4.4  --   HGB 12.6 13.3  HCT 39.2 39.7  MCV 91.1 90.6  PLT 199 170   No results for input(s): CKTOTAL, CKMB, CKMBINDEX, TROPONINI in the last 72 hours.  Recent Labs  03/11/15 1203  DDIMER <0.27   Invalid input(s): POCBNP   RADIOLOGIC STUDIES ON ADMISSION: Dg Chest 2 View  03/11/2015   CLINICAL DATA:  Weakness  EXAM: CHEST  2 VIEW  COMPARISON:  None.  FINDINGS: The heart size and mediastinal contours are within normal limits. Both lungs are clear. The visualized skeletal structures are unremarkable.  IMPRESSION:  No active cardiopulmonary disease.   Electronically Signed   By: Inez Catalina M.D.   On: 03/11/2015 11:45     EKG: Independently reviewed. LBBB  ASSESSMENT AND PLAN: Present on Admission:  . Chest pain: With both typical and atypical features. Admit to telemetry, cycle enzymes. Start aspirin, continue statins. Check echocardiogram. Keep nothing by mouth post midnight., Please consult cardiology in a.m.   Marland Kitchen GERD: Continue with PPI   . Diffuse large B cell lymphoma: Currently in remission-follow[facility]ology [facility]   . Dyslipidemia: Continue with statin  Further plan will depend as patient's clinical course evolves and further radiologic and laboratory data become available. Patient will be monitored closely.  Above noted plan was discussed with patient/son, they were in agreement.   DVT Prophylaxis: Prophylactic Lovenox  Code Status: Full Code  Disposition Plan: Home in 1 day   Total time spent for admission equals 45 minutes.  [facility] Pager [phone]  If 7PM-7AM, please contact night-coverage [email or web address] Password [facility] 03/11/2015, 3:36 PM

## 2015-03-11 NOTE — ED Notes (Signed)
Pt reports having sensation come over here while at church, describes it as a wave from head down to her toes. At that time, felt like she was unable to stand up due to weakness. Now has heaviness on her chest. ekg done at triage, airway intact.

## 2015-03-12 ENCOUNTER — Encounter (HOSPITAL_COMMUNITY): Payer: Self-pay | Admitting: General Practice

## 2015-03-12 ENCOUNTER — Telehealth: Payer: Self-pay | Admitting: Hematology and Oncology

## 2015-03-12 DIAGNOSIS — E785 Hyperlipidemia, unspecified: Secondary | ICD-10-CM | POA: Diagnosis not present

## 2015-03-12 DIAGNOSIS — K219 Gastro-esophageal reflux disease without esophagitis: Secondary | ICD-10-CM | POA: Diagnosis not present

## 2015-03-12 DIAGNOSIS — R079 Chest pain, unspecified: Secondary | ICD-10-CM | POA: Diagnosis not present

## 2015-03-12 DIAGNOSIS — C8330 Diffuse large B-cell lymphoma, unspecified site: Secondary | ICD-10-CM | POA: Diagnosis not present

## 2015-03-12 LAB — TROPONIN I: Troponin I: <0.03 ng/mL (ref <0.031)

## 2015-03-12 NOTE — Consult Note (Signed)
CARDIOLOGY CONSULT NOTE   Patient ID: Tara Sloan MRN: [id number], DOB/AGE: 1946-02-18   Admit date: 03/11/2015 Date of Consult: 03/12/2015   Primary Physician: Loura Pardon, MD Primary Cardiologist: Dr. Rockey Situ  Pt. Profile  69 year old woman had near syncopal episode while sitting in Sunday school yesterday.  Problem List  Past Medical History  Diagnosis Date  . HLD (hyperlipidemia)   . Osteopenia   . GERD (gastroesophageal reflux disease)   . Allergic rhinitis   . External hemorrhoid   . Arthritis     osteopenia  . Osteopenia   . History of radiation therapy 03/22/12 - 04/09/12    right oropharynx/neck  . 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 Cancer     nhl    Past Surgical History  Procedure Laterality Date  . Dilation and curettage of uterus  1987  . Partial hysterectomy  1988    fibroids  . Tubal ligation  1987  . Colonoscopy  5/06    Ext. hemms  . Breast biopsy  12/03    Negative  . Tonsillectomy  11/07/2011    Procedure: TONSILLECTOMY;  Surgeon: Rozetta Nunnery, MD;  Location: [place];  Service: ENT;  Laterality: N/A;     Allergies  Allergies  Allergen Reactions  . Pnu-Imune [Pneumococcal Polysaccharide Vaccine] Hives    HPI   This 69 year old woman with no prior history of known coronary artery disease was sitting in size school yesterday morning and had an episode of near syncope.  She had eaten a light breakfast.  She felt herself becoming lightheaded and weak.  Voices became distant to her.  She kept sitting there because she was sitting in the front row and did not want to make a fuss.  She felt a heavy sensation in her chest and upper abdomen and had mild nausea.  There was no diaphoresis.  She felt as if her heart was pounding heavily.  Question whether it was irregular or not. Her past cardiac history reveals that she has had a known left bundle branch block.  She has a past history of  having had a Lexiscan Myoview stress test on 07/13/14 which showed no ischemia.  Perfusion was normal.  However, her ejection fraction by nuclear was only 28%.  This was felt to be falsely low because of her left bundle branch block.  She had a echocardiogram on 07/25/14 which showed an ejection fraction of 45-50%. The patient has a past history of large B-cell lymphoma.  She has a history of hyperlipidemia and has been on Lipitor 80 mg daily at home.  Inpatient Medications  . aspirin  325 mg Oral QPM  . atorvastatin  80 mg Oral Daily  . calcium-vitamin D  2 tablet Oral Q breakfast  . cholecalciferol  2,000 Units Oral Daily  . enoxaparin (LOVENOX) injection  40 mg Subcutaneous Q24H  . multivitamin with minerals  1 tablet Oral Daily  . pantoprazole  40 mg Oral Daily    Family History Family History  Problem Relation Age of Onset  . Heart attack Father 9  . Heart disease Father   . Stroke Mother 7  . Breast cancer Paternal Aunt   . Cancer Paternal Aunt   . Hyperlipidemia      Family history  . Cancer Paternal Aunt      Social History History   Social History  . Marital Status: Married    Spouse Name:  N/A  . Number of Children: 2  . Years of Education: N/A   Occupational History  . Employed     [employer], Glass blower/designer   Social History Main Topics  . Smoking status: Former Smoker -- 5 years    Quit date: 11/04/1974  . Smokeless tobacco: Never Used  . Alcohol Use: Yes     Comment: Rare  . Drug Use: No  . Sexual Activity: Yes    Birth Control/ Protection: None   Other Topics Concern  . Not on file   Social History Narrative   Married (husband with a lot of health problems and CAD)      2 children      Exercise-going to [employer]      Employed-plans to retire at 39     Review of Systems  General:  No chills, fever, night sweats or weight changes.  Cardiovascular:  No chest pain, dyspnea on exertion, edema, orthopnea, palpitations, paroxysmal  nocturnal dyspnea. Dermatological: No rash, lesions/masses Respiratory: No cough, dyspnea Urologic: No hematuria, dysuria Abdominal:   No nausea, vomiting, diarrhea, bright red blood per rectum, melena, or hematemesis Neurologic:  No visual changes, wkns, changes in mental status. All other systems reviewed and are otherwise negative except as noted above.  Physical Exam  Blood pressure 130/68, pulse 68, temperature 98 F (36.7 C), temperature source Oral, resp. rate 20, weight 134 lb 11.2 oz (61.1 kg), SpO2 100 %.  General: Pleasant, NAD Psych: Normal affect. Neuro: Alert and oriented X 3. Moves all extremities spontaneously. HEENT: Normal  Neck: Supple without bruits or JVD. Lungs:  Resp regular and unlabored, CTA. Heart: RRR no s3, s4, or murmurs. Abdomen: Soft, non-tender, non-distended, BS + x 4.  Extremities: No clubbing, cyanosis or edema. DP/PT/Radials 2+ and equal bilaterally.  Labs   Recent Labs  03/11/15 1656 03/11/15 2340  TROPONINI <0.03 <0.03   Lab Results  Component Value Date   WBC 6.1 03/11/2015   HGB 13.5 03/11/2015   HCT 40.6 03/11/2015   MCV 90.0 03/11/2015   PLT 186 03/11/2015    Recent Labs Lab 03/09/15 1457  03/11/15 1203 03/11/15 1656  NA 143  --  141  --   K 3.8  --  3.8  --   CL  --   --  108  --   CO2 24  --  24  --   BUN 20.9  --  20  --   CREATININE 0.8  < > 0.91 0.89  CALCIUM 8.8  --  9.3  --   PROT 6.4  --   --   --   BILITOT 0.76  --   --   --   ALKPHOS 49  --   --   --   ALT 29  --   --   --   AST 29  --   --   --   GLUCOSE 95  --  109*  --   < > = values in this interval not displayed. Lab Results  Component Value Date   CHOL 171 06/19/2014   HDL 44.20 06/19/2014   LDLCALC 89 06/19/2014   TRIG 187.0* 06/19/2014   Lab Results  Component Value Date   DDIMER <0.27 03/11/2015    Radiology/Studies  Dg Chest 2 View  03/11/2015   CLINICAL DATA:  Weakness  EXAM: CHEST  2 VIEW  COMPARISON:  None.  FINDINGS: The heart  size and mediastinal contours are within normal limits. Both  lungs are clear. The visualized skeletal structures are unremarkable.  IMPRESSION: No active cardiopulmonary disease.   Electronically Signed   By: Inez Catalina M.D.   On: 03/11/2015 11:45    ECG  Normal sinus rhythm.  Left bundle branch block. Telemetry shows NSR  ASSESSMENT AND PLAN  1.  Near syncope. Probable vasovagal episode. 2.  Chest pressure associated with episode of near syncope.  Her EKG shows left bundle branch block and is unchanged.  Recommend: I do not think she needs another stress test at this point. Would see what the echo shows and if no significant difference from August she could be discharged later today with followup with Dr. Rockey Situ.  Consider outpatient event monitor. Signed, Darlin Coco, MD  03/12/2015, 8:43 AM

## 2015-03-12 NOTE — Progress Notes (Signed)
UR completed 

## 2015-03-12 NOTE — Discharge Instructions (Signed)
Cardiac Diet °This diet can help prevent heart disease and stroke. Many factors influence your heart health, including eating and exercise habits. Coronary risk rises a lot with abnormal blood fat (lipid) levels. Cardiac meal planning includes limiting unhealthy fats, increasing healthy fats, and making other small dietary changes. General guidelines are as follows: °· Adjust calorie intake to reach and maintain desirable body weight. °· Limit total fat intake to less than 30% of total calories. Saturated fat should be less than 7% of calories. °· Saturated fats are found in animal products and in some vegetable products. Saturated vegetable fats are found in coconut oil, cocoa butter, palm oil, and palm kernel oil. Read labels carefully to avoid these products as much as possible. Use butter in moderation. Choose tub margarines and oils that have 2 grams of fat or less. Good cooking oils are canola and olive oils. °· Practice low-fat cooking techniques. Do not fry food. Instead, broil, bake, boil, steam, grill, roast on a rack, stir-fry, or microwave it. Other fat reducing suggestions include: °· Remove the skin from poultry. °· Remove all visible fat from meats. °· Skim the fat off stews, soups, and gravies before serving them. °· Steam vegetables in water or broth instead of sautéing them in fat. °· Avoid foods with trans fat (or hydrogenated oils), such as commercially fried foods and commercially baked goods. Commercial shortening and deep-frying fats will contain trans fat. °· Increase intake of fruits, vegetables, whole grains, and legumes to replace foods high in fat. °· Increase consumption of nuts, legumes, and seeds to at least 4 servings weekly. One serving of a legume equals ½ cup, and 1 serving of nuts or seeds equals ¼ cup. °· Choose whole grains more often. Have 3 servings per day (a serving is 1 ounce [oz]). °· Eat 4 to 5 servings of vegetables per day. A serving of vegetables is 1 cup of raw leafy  vegetables; ½ cup of raw or cooked cut-up vegetables; ½ cup of vegetable juice. °· Eat 4 to 5 servings of fruit per day. A serving of fruit is 1 medium whole fruit; ¼ cup of dried fruit; ½ cup of fresh, frozen, or canned fruit; ½ cup of 100% fruit juice. °· Increase your intake of dietary fiber to 20 to 30 grams per day. Insoluble fiber may help lower your risk of heart disease and may help curb your appetite.  °Soluble fiber binds cholesterol to be removed from the blood. Foods high in soluble fiber are dried beans, citrus fruits, oats, apples, bananas, broccoli, Brussels sprouts, and eggplant. °· Try to include foods fortified with plant sterols or stanols, such as yogurt, breads, juices, or margarines. Choose several fortified foods to achieve a daily intake of 2 to 3 grams of plant sterols or stanols. °· Foods with omega-3 fats can help reduce your risk of heart disease. Aim to have a 3.5 oz portion of fatty fish twice per week, such as salmon, mackerel, albacore tuna, sardines, lake trout, or herring. If you wish to take a fish oil supplement, choose one that contains 1 gram of both DHA and EPA. °· Limit processed meats to 2 servings (3 oz portion) weekly. °· Limit the sodium in your diet to 1500 milligrams (mg) per day. If you have high blood pressure, talk to a registered dietitian about a DASH (Dietary Approaches to Stop Hypertension) eating plan. °· Limit sweets and beverages with added sugar, such as soda, to no more than 5 servings per week. One   serving is:   °· 1 tablespoon sugar. °· 1 tablespoon jelly or jam. °· ½ cup sorbet. °· 1 cup lemonade. °· ½ cup regular soda. °CHOOSING FOODS °Starches °· Allowed: Breads: All kinds (wheat, rye, raisin, white, oatmeal, Italian, French, and English muffin bread). Low-fat rolls: English muffins, frankfurter and hamburger buns, bagels, pita bread, tortillas (not fried). Pancakes, waffles, biscuits, and muffins made with recommended oil. °· Avoid: Products made with  saturated or trans fats, oils, or whole milk products. Butter rolls, cheese breads, croissants. Commercial doughnuts, muffins, sweet rolls, biscuits, waffles, pancakes, store-bought mixes. °Crackers °· Allowed: Low-fat crackers and snacks: Animal, graham, rye, saltine (with recommended oil, no lard), oyster, and matzo crackers. Bread sticks, melba toast, rusks, flatbread, pretzels, and light popcorn. °· Avoid: High-fat crackers: cheese crackers, butter crackers, and those made with coconut, palm oil, or trans fat (hydrogenated oils). Buttered popcorn. °Cereals °· Allowed: Hot or cold whole-grain cereals. °· Avoid: Cereals containing coconut, hydrogenated vegetable fat, or animal fat. °Potatoes / Pasta / Rice °· Allowed: All kinds of potatoes, rice, and pasta (such as macaroni, spaghetti, and noodles). °· Avoid: Pasta or rice prepared with cream sauce or high-fat cheese. Chow mein noodles, French fries. °Vegetables °· Allowed: All vegetables and vegetable juices. °· Avoid: Fried vegetables. Vegetables in cream, butter, or high-fat cheese sauces. Limit coconut. Fruit in cream or custard. °Protein °· Allowed: Limit your intake of meat, seafood, and poultry to no more than 6 oz (cooked weight) per day. All lean, well-trimmed beef, veal, pork, and lamb. All chicken and turkey without skin. All fish and shellfish. Wild game: wild duck, rabbit, pheasant, and venison. Egg whites or low-cholesterol egg substitutes may be used as desired. Meatless dishes: recipes with dried beans, peas, lentils, and tofu (soybean curd). Seeds and nuts: all seeds and most nuts. °· Avoid: Prime grade and other heavily marbled and fatty meats, such as short ribs, spare ribs, rib eye roast or steak, frankfurters, sausage, bacon, and high-fat luncheon meats, mutton. Caviar. Commercially fried fish. Domestic duck, goose, venison sausage. Organ meats: liver, gizzard, heart, chitterlings, brains, kidney, sweetbreads. °Dairy °· Allowed: Low-fat  cheeses: nonfat or low-fat cottage cheese (1% or 2% fat), cheeses made with part skim milk, such as mozzarella, farmers, string, or ricotta. (Cheeses should be labeled no more than 2 to 6 grams fat per oz.). Skim (or 1%) milk: liquid, powdered, or evaporated. Buttermilk made with low-fat milk. Drinks made with skim or low-fat milk or cocoa. Chocolate milk or cocoa made with skim or low-fat (1%) milk. Nonfat or low-fat yogurt. °· Avoid: Whole milk cheeses, including colby, cheddar, muenster, Monterey Jack, Havarti, Brie, Camembert, American, Swiss, and blue. Creamed cottage cheese, cream cheese. Whole milk and whole milk products, including buttermilk or yogurt made from whole milk, drinks made from whole milk. Condensed milk, evaporated whole milk, and 2% milk. °Soups and Combination Foods °· Allowed: Low-fat low-sodium soups: broth, dehydrated soups, homemade broth, soups with the fat removed, homemade cream soups made with skim or low-fat milk. Low-fat spaghetti, lasagna, chili, and Spanish rice if low-fat ingredients and low-fat cooking techniques are used. °· Avoid: Cream soups made with whole milk, cream, or high-fat cheese. All other soups. °Desserts and Sweets °· Allowed: Sherbet, fruit ices, gelatins, meringues, and angel food cake. Homemade desserts with recommended fats, oils, and milk products. Jam, jelly, honey, marmalade, sugars, and syrups. Pure sugar candy, such as gum drops, hard candy, jelly beans, marshmallows, mints, and small amounts of dark chocolate. °· Avoid: Commercially prepared   cakes, pies, cookies, frosting, pudding, or mixes for these products. Desserts containing whole milk products, chocolate, coconut, lard, palm oil, or palm kernel oil. Ice cream or ice cream drinks. Candy that contains chocolate, coconut, butter, hydrogenated fat, or unknown ingredients. Buttered syrups. Fats and Oils  Allowed: Vegetable oils: safflower, sunflower, corn, soybean, cottonseed, sesame, canola, olive,  or peanut. Non-hydrogenated margarines. Salad dressing or mayonnaise: homemade or commercial, made with a recommended oil. Low or nonfat salad dressing or mayonnaise.  Limit added fats and oils to 6 to 8 tsp per day (includes fats used in cooking, baking, salads, and spreads on bread). Remember to count the "hidden fats" in foods.  Avoid: Solid fats and shortenings: butter, lard, salt pork, bacon drippings. Gravy containing meat fat, shortening, or suet. Cocoa butter, coconut. Coconut oil, palm oil, palm kernel oil, or hydrogenated oils: these ingredients are often used in bakery products, nondairy creamers, whipped toppings, candy, and commercially fried foods. Read labels carefully. Salad dressings made of unknown oils, sour cream, or cheese, such as blue cheese and Roquefort. Cream, all kinds: half-and-half, light, heavy, or whipping. Sour cream or cream cheese (even if "light" or low-fat). Nondairy cream substitutes: coffee creamers and sour cream substitutes made with palm, palm kernel, hydrogenated oils, or coconut oil. Beverages  Allowed: Coffee (regular or decaffeinated), tea. Diet carbonated beverages, mineral water. Alcohol: Check with your caregiver. Moderation is recommended.  Avoid: Whole milk, regular sodas, and juice drinks with added sugar. Condiments  Allowed: All seasonings and condiments. Cocoa powder. "Cream" sauces made with recommended ingredients.  Avoid: Carob powder made with hydrogenated fats. SAMPLE MENU Breakfast   cup orange juice   cup oatmeal  1 slice toast  1 tsp margarine  1 cup skim milk Lunch  [place] sandwich with 2 oz [place], 2 slices bread  Lettuce and tomato slices  Fresh fruit  Carrot sticks  Coffee or tea Snack  Fresh fruit or low-fat crackers Dinner  3 oz lean ground beef  1 baked potato  1 tsp margarine   cup asparagus  Lettuce salad  1 tbs non-creamy dressing   cup peach slices  1 cup skim milk Document Released:  09/16/2008 Document Revised: 06/08/2012 Document Reviewed: 02/07/2014 [employer] Patient Information 2015 [place], [place]. This information is not intended to replace advice given to you by your health care provider. Make sure you discuss any questions you have with your health care provider.   Chest Pain (Nonspecific) It is often hard to give a diagnosis for the cause of chest pain. There is always a chance that your pain could be related to something serious, such as a heart attack or a blood clot in the lungs. You need to follow up with your doctor. HOME CARE  If antibiotic medicine was given, take it as directed by your doctor. Finish the medicine even if you start to feel better.  For the next few days, avoid activities that bring on chest pain. Continue physical activities as told by your doctor.  Do not use any tobacco products. This includes cigarettes, chewing tobacco, and e-cigarettes.  Avoid drinking alcohol.  Only take medicine as told by your doctor.  Follow your doctor's suggestions for more testing if your chest pain does not go away.  Keep all doctor visits you made. GET HELP IF:  Your chest pain does not go away, even after treatment.  You have a rash with blisters on your chest.  You have a fever. GET HELP RIGHT AWAY IF:  You have more pain or pain that spreads to your arm, neck, jaw, back, or belly (abdomen).  You have shortness of breath.  You cough more than usual or cough up blood.  You have very bad back or belly pain.  You feel sick to your stomach (nauseous) or throw up (vomit).  You have very bad weakness.  You pass out (faint).  You have chills. This is an emergency. Do not wait to see if the problems will go away. Call your local emergency services (911 in [place] Do not drive yourself to the hospital. MAKE SURE YOU:   Understand these instructions.  Will watch your condition.  Will get help right away if you are not doing well or get  worse. Document Released: 05/26/2008 Document Revised: 12/13/2013 Document Reviewed: 05/26/2008 [facility] Patient Information 2015 [place], [place]. This information is not intended to replace advice given to you by your health care provider. Make sure you discuss any questions you have with your health care provider.

## 2015-03-12 NOTE — Telephone Encounter (Signed)
left voicemail for pt regarding to March 2017 appt....mailed pt appt sched/avs and letter

## 2015-03-12 NOTE — Discharge Summary (Signed)
Physician Discharge Summary  SHAHLA BETSILL WUJ:[id number] DOB: [birth year] DOA: 03/11/2015  PCP: Loura Pardon, MD  Admit date: 03/11/2015 Discharge date: 03/12/2015  Time spent: 40 minutes  Recommendations for Outpatient Follow-up:   Follow-up with primary cardiologist in 1-2 weeks.  Discharge Diagnoses:  Principal Problem:   Chest pain Active Problems:   Dyslipidemia   GERD   Diffuse large B cell lymphoma   Discharge Condition: stable  Diet recommendation: heart healthy  Filed Weights   03/12/15 0500  Weight: 61.1 kg (134 lb 11.2 oz)    History of present illness:  Tara Sloan is a 69 y.o. female with a Past Medical History of large B-cell lymphoma, hyperlipidemia who presents today with the above noted complaint. Patient was at church earlier this morning when she had a spell of "generalized weakness" all of a sudden while sitting down-she thinks she almost had a presyncopal episode during this event. This lasted for a few seconds. This was followed by retrosternal discomfort which she describes as heaviness. There was no radiation of the chest pain. There was associated palpitations. But no associated nausea vomiting or diaphoresis. Her symptoms lasted for around 20 minutes. She was then brought to the emergency room for further evaluation and treatment. Initial enzymes were negative, she has a known left bundle branch block. During my evaluation, patient was essentially symptom free. She was no longer having any of her presenting symptoms. History of headache, fever, nausea, vomiting, diarrhea abdominal pain  She denies any dysuria as well.  Hospital Course:   Chest pain Patient presented to the hospital with atypical chest pain. She had a spell of generalized weakness and presyncopal episode along with palpitations and retrosternal discomfort. Patient reported her symptoms continued for about 20 minutes. Plan admission to the hospital ACS ruled out by -3 sets of  cardiac enzymes. She has chronic LBBB. Seen the cardiologist suggested to obtain 2-D echocardiogram and if it's negative can be discharged. 2-D echocardiogram showed ejection fraction of about 50-55% and no wall motion abnormalities with grade 1 diastolic dysfunction. No significant valvular abnormality, there is motion paradox secondary to the LBBB. Cardiology recommended for her to follow-up with her primary cardiologist in [place].  History of diffuse Large B-cell lymphoma Currently in remission, follows with the oncology at the cancer center.  GERD Patient on low dose of PPI, she is on naproxen for pain and alendronate for osteoporosis. Patient has symptoms of chest pain again, consider referral to gastroenterologist. Naproxen can cause GERD symptoms to flare up, alendronate cause pill esophagitis.  Dyslipidemia Continue with statins.  Procedures:  none  Consultations:  none  Discharge Exam: Filed Vitals:   03/12/15 1443  BP: 104/56  Pulse: 75  Temp: 98.6 F (37 C)  Resp: 15   General: Alert and awake, oriented x3, not in any acute distress. HEENT: anicteric sclera, pupils reactive to light and accommodation, EOMI CVS: S1-S2 clear, no murmur rubs or gallops Chest: clear to auscultation bilaterally, no wheezing, rales or rhonchi Abdomen: soft nontender, nondistended, normal bowel sounds, no organomegaly Extremities: no cyanosis, clubbing or edema noted bilaterally Neuro: Cranial nerves II-XII intact, no focal neurological deficits  Discharge Instructions   Discharge Instructions    Diet - low sodium heart healthy    Complete by:  As directed      Increase activity slowly    Complete by:  As directed           Current Discharge Medication List    CONTINUE these  medications which have NOT CHANGED   Details  acetaminophen (TYLENOL) 500 MG tablet Take 1,000 mg by mouth every 8 (eight) hours as needed for mild pain or headache.    alendronate (FOSAMAX) 70  MG tablet Take 1 tablet (70 mg total) by mouth every 7 (seven) days. Take with a full glass of water on an empty stomach. Qty: 4 tablet, Refills: 11    aspirin 81 MG tablet Take 81 mg by mouth every evening.     atorvastatin (LIPITOR) 80 MG tablet Take 1 tablet (80 mg total) by mouth daily. Qty: 90 tablet, Refills: 3    Calcium-Vitamin D-Vitamin K 500-100-40 MG-UNT-MCG CHEW Chew 2 tablets by mouth daily.     Cholecalciferol (VITAMIN D-3) 1000 UNITS CAPS Take 2,000 Units by mouth daily.     Multiple Vitamin (MULTIVITAMIN) tablet Take 1 tablet by mouth daily.      naproxen sodium (ANAPROX) 220 MG tablet Take 220 mg by mouth 2 (two) times daily as needed (for pain). PAIN     omeprazole (PRILOSEC) 20 MG capsule Take 1 capsule (20 mg total) by mouth daily as needed (for heartburn). Qty: 90 capsule, Refills: 3       Allergies  Allergen Reactions  . Pnu-Imune [Pneumococcal Polysaccharide Vaccine] Hives   Follow-up Information    Follow up with Ida Rogue, MD In 2 weeks.   Specialty:  Cardiology   Contact information:   [place] [address] [phone]        The results of significant diagnostics from this hospitalization (including imaging, microbiology, ancillary and laboratory) are listed below for reference.    Significant Diagnostic Studies: Dg Chest 2 View  03/11/2015   CLINICAL DATA:  Weakness  EXAM: CHEST  2 VIEW  COMPARISON:  None.  FINDINGS: The heart size and mediastinal contours are within normal limits. Both lungs are clear. The visualized skeletal structures are unremarkable.  IMPRESSION: No active cardiopulmonary disease.   Electronically Signed   By: Inez Catalina M.D.   On: 03/11/2015 11:45    Microbiology: No results found for this or any previous visit (from the past 240 hour(s)).   Labs: Basic Metabolic Panel:  Recent Labs Lab 03/09/15 1457 03/11/15 1203 03/11/15 1656  NA 143 141  --   K 3.8 3.8  --   CL  --  108  --    CO2 24 24  --   GLUCOSE 95 109*  --   BUN 20.9 20  --   CREATININE 0.8 0.91 0.89  CALCIUM 8.8 9.3  --    Liver Function Tests:  Recent Labs Lab 03/09/15 1457  AST 29  ALT 29  ALKPHOS 49  BILITOT 0.76  PROT 6.4  ALBUMIN 3.9   No results for input(s): LIPASE, AMYLASE in the last 168 hours. No results for input(s): AMMONIA in the last 168 hours. CBC:  Recent Labs Lab 03/09/15 1456 03/11/15 1203 03/11/15 1656  WBC 7.4 6.8 6.1  NEUTROABS 4.4  --   --   HGB 12.6 13.3 13.5  HCT 39.2 39.7 40.6  MCV 91.1 90.6 90.0  PLT 199 170 186   Cardiac Enzymes:  Recent Labs Lab 03/11/15 1656 03/11/15 2340  TROPONINI <0.03 <0.03   BNP: BNP (last 3 results) No results for input(s): BNP in the last 8760 hours.  ProBNP (last 3 results) No results for input(s): PROBNP in the last 8760 hours.  CBG: No results for input(s): GLUCAP in the last 168 hours.  Signed:  Audra Bellard A  [facility] 03/12/2015, 4:09 PM

## 2015-03-12 NOTE — Progress Notes (Signed)
  Echocardiogram 2D Echocardiogram has been performed.  Tara Sloan 03/12/2015, 11:18 AM

## 2015-04-02 ENCOUNTER — Encounter: Payer: Self-pay | Admitting: Internal Medicine

## 2015-04-02 ENCOUNTER — Ambulatory Visit (INDEPENDENT_AMBULATORY_CARE_PROVIDER_SITE_OTHER): Payer: Medicare Other | Admitting: Internal Medicine

## 2015-04-02 VITALS — BP 114/70 | HR 74 | Temp 97.9°F | Wt 138.0 lb

## 2015-04-02 DIAGNOSIS — J309 Allergic rhinitis, unspecified: Secondary | ICD-10-CM | POA: Diagnosis not present

## 2015-04-02 NOTE — Progress Notes (Signed)
Pre visit review using our clinic review tool, if applicable. No additional management support is needed unless otherwise documented below in the visit note. 

## 2015-04-02 NOTE — Progress Notes (Signed)
HPI  Pt presents to the clinic today with c/o ear pain, nasal congestion, sore throat and cough. She reports this started 1 week ago. The cough is non productive. She is not blowing anything out of her nose. She has been hoarse. She denies fever, chills or body aches. She has tried Benadryl, Sudafed and Mucinex with minimal relief. She has not had sick contacts. She does have a history of seasonal allergies. She does not smoke.  Review of Systems    Past Medical History  Diagnosis Date  . HLD (hyperlipidemia)   . GERD (gastroesophageal reflux disease)   . Allergic rhinitis   . External hemorrhoid   . Osteopenia   . History of radiation therapy 03/22/12 - 04/09/12    right oropharynx/neck  . LBBB (left bundle branch block) dx'd 05/2014  . Arthritis     "hands; probably qwhere" (03/12/2015)  . Chronic back pain   . 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 Non Hodgkin's lymphoma   . Basal cell carcinoma ~ 2010    "leg"    Family History  Problem Relation Age of Onset  . Heart attack Father 20  . Heart disease Father   . Stroke Mother 47  . Breast cancer Paternal Aunt   . Cancer Paternal Aunt   . Hyperlipidemia      Family history  . Cancer Paternal Aunt     History   Social History  . Marital Status: Married    Spouse Name: N/A  . Number of Children: 2  . Years of Education: N/A   Occupational History  . Employed     [employer], Glass blower/designer   Social History Main Topics  . Smoking status: Former Smoker -- 0.25 packs/day for 10 years    Types: Cigarettes    Quit date: 11/04/1974  . Smokeless tobacco: Never Used  . Alcohol Use: Yes     Comment: 03/12/2015 "last drink was awhile ago; might have a couple drinks/yr at most"  . Drug Use: No  . Sexual Activity: No   Other Topics Concern  . Not on file   Social History Narrative   Married (husband with a lot of health problems and CAD)      2 children      Exercise-going to  [employer]      Employed-plans to retire at 51    Allergies  Allergen Reactions  . Pnu-Imune [Pneumococcal Polysaccharide Vaccine] Hives     Constitutional: Positive headache, fatigue. Denies fever or abrupt weight changes.  HEENT:  Positive facial pain, ear pain, nasal congestion and sore throat. Denies eye redness, ear pain, ringing in the ears, wax buildup, runny nose or bloody nose. Respiratory: Positive cough. Denies difficulty breathing or shortness of breath.  Cardiovascular: Denies chest pain, chest tightness, palpitations or swelling in the hands or feet.   No other specific complaints in a complete review of systems (except as listed in HPI above).  Objective:  BP 114/70 mmHg  Pulse 74  Temp(Src) 97.9 F (36.6 C) (Oral)  Wt 138 lb (62.596 kg)  SpO2 98%   General: Appears her stated age, well developed, well nourished in NAD. HEENT: Head: normal shape and size, no sinus tenderness noted; Eyes: sclera white, no icterus, conjunctiva pink; Ears: Tm's gray and intact, normal light reflex; + serous effusion noted on the right. Nose: mucosa boggy and moist, septum midline; Throat/Mouth: + PND. Teeth present, mucosa erythematous and moist,  no exudate noted, no lesions or ulcerations noted.  Neck: No adenopathy noted  Cardiovascular: Normal rate and rhythm. S1,S2 noted.  No murmur, rubs or gallops noted.  Pulmonary/Chest: Normal effort and positive vesicular breath sounds. No respiratory distress. No wheezes, rales or ronchi noted.      Assessment & Plan:   Allergic rhinitis:  Can use a Neti Pot which can be purchased from your local drug store. Flonase 2 sprays each nostril for 3 days and then as needed. Stop taking all the other drugs and start Zyrtec  Watch for fever, colored mucous, worsening facial pain and pressure  RTC as needed or if symptoms persist.

## 2015-04-02 NOTE — Patient Instructions (Signed)

## 2015-05-14 ENCOUNTER — Other Ambulatory Visit: Payer: Self-pay | Admitting: Family Medicine

## 2015-06-21 ENCOUNTER — Telehealth: Payer: Self-pay | Admitting: Family Medicine

## 2015-06-21 DIAGNOSIS — R739 Hyperglycemia, unspecified: Secondary | ICD-10-CM

## 2015-06-21 DIAGNOSIS — E785 Hyperlipidemia, unspecified: Secondary | ICD-10-CM

## 2015-06-21 DIAGNOSIS — Z0000 Encounter for general adult medical examination without abnormal findings: Secondary | ICD-10-CM | POA: Insufficient documentation

## 2015-06-21 NOTE — Telephone Encounter (Signed)
-----   Message from Ellamae Sia sent at 06/15/2015 11:16 AM EDT ----- Regarding: Lab orders for Friday,7.1.16 Patient is scheduled for CPX labs, please order future labs, Thanks , Karna Christmas

## 2015-06-22 ENCOUNTER — Other Ambulatory Visit: Payer: Self-pay

## 2015-06-22 ENCOUNTER — Other Ambulatory Visit (INDEPENDENT_AMBULATORY_CARE_PROVIDER_SITE_OTHER): Payer: Medicare Other

## 2015-06-22 ENCOUNTER — Other Ambulatory Visit: Payer: Self-pay | Admitting: Family Medicine

## 2015-06-22 DIAGNOSIS — R739 Hyperglycemia, unspecified: Secondary | ICD-10-CM

## 2015-06-22 DIAGNOSIS — Z0000 Encounter for general adult medical examination without abnormal findings: Secondary | ICD-10-CM | POA: Diagnosis not present

## 2015-06-22 DIAGNOSIS — E785 Hyperlipidemia, unspecified: Secondary | ICD-10-CM

## 2015-06-22 LAB — COMPREHENSIVE METABOLIC PANEL
ALT: 18 U/L (ref 0–35)
AST: 21 U/L (ref 0–37)
Albumin: 3.9 g/dL (ref 3.5–5.2)
Alkaline Phosphatase: 52 U/L (ref 39–117)
BUN: 23 mg/dL (ref 6–23)
CO2: 26 mEq/L (ref 19–32)
Calcium: 9.3 mg/dL (ref 8.4–10.5)
Chloride: 108 mEq/L (ref 96–112)
Creatinine, Ser: 0.89 mg/dL (ref 0.40–1.20)
GFR: 66.72 mL/min (ref >60.00)
Glucose, Bld: 101 mg/dL — ABNORMAL HIGH (ref 70–99)
Potassium: 3.9 mEq/L (ref 3.5–5.1)
Sodium: 143 mEq/L (ref 135–145)
Total Bilirubin: 0.9 mg/dL (ref 0.2–1.2)
Total Protein: 6.4 g/dL (ref 6.0–8.3)

## 2015-06-22 LAB — LIPID PANEL
Cholesterol: 131 mg/dL (ref 0–200)
HDL: 36.6 mg/dL — ABNORMAL LOW (ref >39.00)
LDL Cholesterol: 74 mg/dL (ref 0–99)
NonHDL: 94.4
Total CHOL/HDL Ratio: 4
Triglycerides: 101 mg/dL (ref 0.0–149.0)
VLDL: 20.2 mg/dL (ref 0.0–40.0)

## 2015-06-22 LAB — CBC WITH DIFFERENTIAL/PLATELET
Basophils Absolute: 0 10*3/uL (ref 0.0–0.1)
Basophils Relative: 0.5 % (ref 0.0–3.0)
Eosinophils Absolute: 0.1 10*3/uL (ref 0.0–0.7)
Eosinophils Relative: 1.1 % (ref 0.0–5.0)
HCT: 40.7 % (ref 36.0–46.0)
Hemoglobin: 13.6 g/dL (ref 12.0–15.0)
Lymphocytes Relative: 29 % (ref 12.0–46.0)
Lymphs Abs: 2 10*3/uL (ref 0.7–4.0)
MCHC: 33.5 g/dL (ref 30.0–36.0)
MCV: 90.7 fl (ref 78.0–100.0)
Monocytes Absolute: 0.6 10*3/uL (ref 0.1–1.0)
Monocytes Relative: 8.6 % (ref 3.0–12.0)
Neutro Abs: 4.2 10*3/uL (ref 1.4–7.7)
Neutrophils Relative %: 60.8 % (ref 43.0–77.0)
Platelets: 193 10*3/uL (ref 150.0–400.0)
RBC: 4.48 Mil/uL (ref 3.87–5.11)
RDW: 14.4 % (ref 11.5–15.5)
WBC: 6.9 10*3/uL (ref 4.0–10.5)

## 2015-06-22 LAB — TSH: TSH: 1.81 u[IU]/mL (ref 0.35–4.50)

## 2015-06-22 LAB — HEMOGLOBIN A1C: Hgb A1c MFr Bld: 5.8 % (ref 4.6–6.5)

## 2015-06-22 MED ORDER — ATORVASTATIN CALCIUM 80 MG PO TABS: 80.0000 mg | ORAL_TABLET | Freq: Every day | ORAL | Status: DC

## 2015-06-22 NOTE — Telephone Encounter (Signed)
Refill sent for atorvastatin. 

## 2015-06-28 ENCOUNTER — Encounter: Payer: Self-pay | Admitting: Internal Medicine

## 2015-06-29 ENCOUNTER — Ambulatory Visit (INDEPENDENT_AMBULATORY_CARE_PROVIDER_SITE_OTHER): Payer: Medicare Other | Admitting: Family Medicine

## 2015-06-29 ENCOUNTER — Encounter: Payer: Self-pay | Admitting: Internal Medicine

## 2015-06-29 ENCOUNTER — Encounter: Payer: Self-pay | Admitting: Family Medicine

## 2015-06-29 VITALS — BP 138/84 | HR 75 | Temp 98.3°F | Ht 63.0 in | Wt 131.8 lb

## 2015-06-29 DIAGNOSIS — Z1211 Encounter for screening for malignant neoplasm of colon: Secondary | ICD-10-CM

## 2015-06-29 DIAGNOSIS — Z0000 Encounter for general adult medical examination without abnormal findings: Secondary | ICD-10-CM | POA: Diagnosis not present

## 2015-06-29 DIAGNOSIS — R739 Hyperglycemia, unspecified: Secondary | ICD-10-CM

## 2015-06-29 DIAGNOSIS — E785 Hyperlipidemia, unspecified: Secondary | ICD-10-CM

## 2015-06-29 DIAGNOSIS — M8580 Other specified disorders of bone density and structure, unspecified site: Secondary | ICD-10-CM

## 2015-06-29 MED ORDER — ALENDRONATE SODIUM 70 MG PO TABS: ORAL_TABLET | ORAL | Status: DC

## 2015-06-29 MED ORDER — OMEPRAZOLE 20 MG PO CPDR: DELAYED_RELEASE_CAPSULE | ORAL | Status: DC

## 2015-06-29 NOTE — Assessment & Plan Note (Signed)
In pt on PPI with hx of ca  On fosamax-tolerating well  dexa 7/15  Disc need for calcium/ vitamin D/ wt bearing exercise and bone density test every 2 y to monitor Disc safety/ fracture risk in detail   No falls or fx

## 2015-06-29 NOTE — Assessment & Plan Note (Signed)
Max dose of lipitor from Dr Colletta [place] control but HDL not at goal  Enc to continue exercise  Disc goals for lipids and reasons to control them Rev labs with pt Rev low sat fat diet in detail

## 2015-06-29 NOTE — Assessment & Plan Note (Signed)
Reviewed health habits including diet and exercise and skin cancer prevention Reviewed appropriate screening tests for age  Also reviewed health mt list, fam hx and immunization status , as well as social and family history   See HPI Lab reviewed  imms utd Will schedule her own mammogram in sept  colonosc ref done for screening (9 y)

## 2015-06-29 NOTE — Progress Notes (Signed)
Subjective:    Patient ID: Tara Sloan, female    DOB: July 03, 1946, 69 y.o.   MRN: [id number]  HPI Here for annual medicare wellness visit as well as chronic/acute medical problems as well as annual preventative exam  I have personally reviewed the Medicare Annual Wellness questionnaire and have noted 1. The patient's medical and social history 2. Their use of alcohol, tobacco or illicit drugs 3. Their current medications and supplements 4. The patient's functional ability including ADL's, fall risks, home safety risks and hearing or visual             impairment. 5. Diet and physical activities 6. Evidence for depression or mood disorders  The patients weight, height, BMI have been recorded in the chart and visual acuity is per eye clinic.  I have made referrals, counseling and provided education to the patient based review of the above and I have provided the pt with a written personalized care plan for preventive services. Reviewed and updated provider list, see scanned forms.  See scanned forms.  Routine anticipatory guidance given to patient.  See health maintenance. Colon cancer screening 5/06 colonoscopy- is time for her 10 year recall / she got the letter  Breast cancer screening mammogram 9/15 nl - due in Sept  Self breast exam-no lumps or changes  Flu vaccine 10/15 Tetanus vaccine 3/14 Pneumovax 7/15- complete with both vaccines  Zoster vaccine 10/13 dexa 7/15 T score -1.6 in FN - on fosamax / doing fine so far , no falls or fractures/takes her ca and D , and last D level was fine  Advance directive-has a living will and POA  Cognitive function addressed- see scanned forms- and if abnormal then additional documentation follows.  occ misplaces items/no other problems (generally mentally sharp) [facility]   [place] and [place] reviewed  Meds, vitals, and allergies reviewed.   ROS: See HPI.  Otherwise negative.    Wt is down 7 lb with bmi of  23  Hyperglycemia Lab Results  Component Value Date   HGBA1C 5.8 06/22/2015  down from 6.1  Watching sugar and carbs - has stopped drinking soft drinks , more water   Dyslipidemia Lab Results  Component Value Date   CHOL 131 06/22/2015   CHOL 171 06/19/2014   CHOL 165 03/10/2013   Lab Results  Component Value Date   HDL 36.60* 06/22/2015   HDL 44.20 06/19/2014   HDL 40.70 03/10/2013   Lab Results  Component Value Date   LDLCALC 74 06/22/2015   LDLCALC 89 06/19/2014   LDLCALC 98 03/10/2013   Lab Results  Component Value Date   TRIG 101.0 06/22/2015   TRIG 187.0* 06/19/2014   TRIG 134.0 03/10/2013   Lab Results  Component Value Date   CHOLHDL 4 06/22/2015   CHOLHDL 4 06/19/2014   CHOLHDL 4 03/10/2013   Lab Results  Component Value Date   LDLDIRECT 92.2 03/02/2012    Dr Rockey Situ inc her lipitor to 80  Improved  Diet is improved (also eats more chicken than beef)     Chemistry      Component Value Date/Time   NA 143 06/22/2015 0848   NA 143 03/09/2015 1457   NA 145 03/03/2012 1201   K 3.9 06/22/2015 0848   K 3.8 03/09/2015 1457   K 3.8 03/03/2012 1201   CL 108 06/22/2015 0848   CL 105 03/08/2013 0851   CL 99 03/03/2012 1201   CO2 26 06/22/2015  0848   CO2 24 03/09/2015 1457   CO2 28 03/03/2012 1201   BUN 23 06/22/2015 0848   BUN 20.9 03/09/2015 1457   BUN 19 03/03/2012 1201   CREATININE 0.89 06/22/2015 0848   CREATININE 0.8 03/09/2015 1457   CREATININE 0.8 03/03/2012 1201      Component Value Date/Time   CALCIUM 9.3 06/22/2015 0848   CALCIUM 8.8 03/09/2015 1457   CALCIUM 9.1 03/03/2012 1201   ALKPHOS 52 06/22/2015 0848   ALKPHOS 49 03/09/2015 1457   ALKPHOS 71 03/03/2012 1201   AST 21 06/22/2015 0848   AST 29 03/09/2015 1457   AST 50* 03/03/2012 1201   ALT 18 06/22/2015 0848   ALT 29 03/09/2015 1457   ALT 64* 03/03/2012 1201   BILITOT 0.9 06/22/2015 0848   BILITOT 0.76 03/09/2015 1457   BILITOT 0.60 03/03/2012 1201     glucose 101  Lab  Results  Component Value Date   WBC 6.9 06/22/2015   HGB 13.6 06/22/2015   HCT 40.7 06/22/2015   MCV 90.7 06/22/2015   PLT 193.0 06/22/2015    Lab Results  Component Value Date   TSH 1.81 06/22/2015      Patient Active Problem List   Diagnosis Date Noted  . Routine general medical examination at a health care facility 06/21/2015  . Chest pain 03/11/2015  . Left bundle branch block (LBBB) 07/06/2014  . Encounter for Medicare annual wellness exam 03/17/2013  . 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 Pain in joint, ankle and foot 03/16/2013  . Hyperglycemia 12/01/2011  . Diffuse large B cell lymphoma 11/22/2011  . ARTHRITIS, HANDS, BILATERAL 12/12/2010  . Dyslipidemia 04/02/2008  . ALLERGIC RHINITIS 04/02/2008  . GERD 04/02/2008  . Osteopenia 09/13/2007  . ALLERGY 08/06/2007   Past Medical History  Diagnosis Date  . HLD (hyperlipidemia)   . GERD (gastroesophageal reflux disease)   . Allergic rhinitis   . External hemorrhoid   . Osteopenia   . History of radiation therapy 03/22/12 - 04/09/12    right oropharynx/neck  . LBBB (left bundle branch block) dx'd 05/2014  . Arthritis     "hands; probably qwhere" (03/12/2015)  . Chronic back pain   . 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 Non Hodgkin's lymphoma   . Basal cell carcinoma ~ 2010    "leg"   Past Surgical History  Procedure Laterality Date  . Dilation and curettage of uterus  1987  . Tubal ligation  1987  . Colonoscopy  5/06    Ext. hemms  . Tonsillectomy  11/07/2011    Procedure: TONSILLECTOMY;  Surgeon: Rozetta Nunnery, MD;  Location: [facility];  Service: ENT;  Laterality: N/A;  . Tonsillectomy  10/2011    "right was cancerous"  . Breast biopsy Left 12/03    Negative  . Abdominal hysterectomy  1988    "partial; for fibroids"  . Basal cell carcinoma excision Right ~ 2010    "leg"   History  Substance Use Topics  . Smoking status: Former Smoker -- 0.25  packs/day for 10 years    Types: Cigarettes    Quit date: 11/04/1974  . Smokeless tobacco: Never Used  . Alcohol Use: 0.0 oz/week    0 Standard drinks or equivalent per week     Comment: 03/12/2015 "last drink was awhile ago; might have a couple drinks/yr at most"   Family History  Problem Relation Age of Onset  . Heart  attack Father 22  . Heart disease Father   . Stroke Mother 76  . Breast cancer Paternal Aunt   . Cancer Paternal Aunt   . Hyperlipidemia      Family history  . Cancer Paternal Aunt    Allergies  Allergen Reactions  . Pnu-Imune [Pneumococcal Polysaccharide Vaccine] Hives   Current Outpatient Prescriptions on File Prior to Visit  Medication Sig Dispense Refill  . acetaminophen (TYLENOL) 500 MG tablet Take 1,000 mg by mouth every 8 (eight) hours as needed for mild pain or headache.    . alendronate (FOSAMAX) 70 MG tablet TAKE 1 TABLET BY MOUTH EVERY 7 DAYS ON AN EMPTY STOMACH WITH A FULL GLASS OF WATER 4 tablet 1  . aspirin 81 MG tablet Take 81 mg by mouth every evening.     Marland Kitchen atorvastatin (LIPITOR) 80 MG tablet Take 1 tablet (80 mg total) by mouth daily. 90 tablet 3  . Calcium-Vitamin D-Vitamin K 500-100-40 MG-UNT-MCG CHEW Chew 2 tablets by mouth daily.     . Cholecalciferol (VITAMIN D-3) 1000 UNITS CAPS Take 2,000 Units by mouth daily.     . Multiple Vitamin (MULTIVITAMIN) tablet Take 1 tablet by mouth daily.      . naproxen sodium (ANAPROX) 220 MG tablet Take 220 mg by mouth 2 (two) times daily as needed (for pain). PAIN     . omeprazole (PRILOSEC) 20 MG capsule TAKE 1 CAPSULE BY MOUTH DAILY AS NEEDED FOR HEARTBURN 90 capsule 1  . [DISCONTINUED] prochlorperazine (COMPAZINE) 10 MG tablet Take 1 tablet (10 mg total) by mouth every 6 (six) hours as needed (Nausea or vomiting). 30 tablet 6   No current facility-administered medications on file prior to visit.    Review of Systems Review of Systems  Constitutional: Negative for fever, appetite change, fatigue and  unexpected weight change.  Eyes: Negative for pain and visual disturbance.  Respiratory: Negative for cough and shortness of breath.   Cardiovascular: Negative for cp or palpitations    Gastrointestinal: Negative for nausea, diarrhea and constipation.  Genitourinary: Negative for urgency and frequency.  Skin: Negative for pallor or rash   Neurological: Negative for weakness, light-headedness, numbness and headaches.  Hematological: Negative for adenopathy. Does not bruise/bleed easily.  Psychiatric/Behavioral: Negative for dysphoric mood. The patient is not nervous/anxious.         Objective:   Physical Exam  Constitutional: She appears well-developed and well-nourished. No distress.  Well appearing   HENT:  Head: Normocephalic and atraumatic.  Right Ear: External ear normal.  Left Ear: External ear normal.  Mouth/Throat: Oropharynx is clear and moist.  Eyes: Conjunctivae and EOM are normal. Pupils are equal, round, and reactive to light. No scleral icterus.  Neck: Normal range of motion. Neck supple. No JVD present. Carotid bruit is not present. No thyromegaly present.  Cardiovascular: Normal rate, regular rhythm, normal heart sounds and intact distal pulses.  Exam reveals no gallop.   Pulmonary/Chest: Effort normal and breath sounds normal. No respiratory distress. She has no wheezes. She exhibits no tenderness.  Abdominal: Soft. Bowel sounds are normal. She exhibits no distension, no abdominal bruit and no mass. There is no tenderness.  Genitourinary: No breast swelling, tenderness, discharge or bleeding.  Breast exam: No mass, nodules, thickening, tenderness, bulging, retraction, inflamation, nipple discharge or skin changes noted.  No axillary or clavicular LA.      Musculoskeletal: Normal range of motion. She exhibits no edema or tenderness.  Lymphadenopathy:    She has no cervical adenopathy.  Neurological: She is alert. She has normal reflexes. No cranial nerve deficit. She  exhibits normal muscle tone. Coordination normal.  Skin: Skin is warm and dry. No rash noted. No erythema. No pallor.  Psychiatric: She has a normal mood and affect.  Nursing note and vitals reviewed.         Assessment & Plan:   Problem List Items Addressed This Visit    Colon cancer screening    Ref for 10 year screening (recall) colonoscopy      Relevant Orders   Ambulatory referral to Gastroenterology   Dyslipidemia    Max dose of lipitor from Dr Colletta [place] control but HDL not at goal  Enc to continue exercise  Disc goals for lipids and reasons to control them Rev labs with pt Rev low sat fat diet in detail       Encounter for Medicare annual wellness exam - Primary    Reviewed health habits including diet and exercise and skin cancer prevention Reviewed appropriate screening tests for age  Also reviewed health mt list, fam hx and immunization status , as well as social and family history   See HPI Lab reviewed  imms utd Will schedule her own mammogram in sept  colonosc ref done for screening (10 y)       Hyperglycemia    Great control with low glycemic diet and exercise Lab Results  Component Value Date   HGBA1C 5.8 06/22/2015          Osteopenia    In pt on PPI with hx of ca  On fosamax-tolerating well  dexa 7/15  Disc need for calcium/ vitamin D/ wt bearing exercise and bone density test every 2 y to monitor Disc safety/ fracture risk in detail   No falls or fx       Routine general medical examination at a health care facility    Reviewed health habits including diet and exercise and skin cancer prevention Reviewed appropriate screening tests for age  Also reviewed health mt list, fam hx and immunization status , as well as social and family history   See HPI Lab reviewed  imms utd Will schedule her own mammogram in sept  colonosc ref done for screening (10 y)

## 2015-06-29 NOTE — Assessment & Plan Note (Signed)
Great control with low glycemic diet and exercise Lab Results  Component Value Date   HGBA1C 5.8 06/22/2015

## 2015-06-29 NOTE — Patient Instructions (Signed)
Continue your calcium and vitamin D and exercise  Stop at check out for referral for colonoscopy  Labs look good  Get your mammogram in the fall  I'm glad you are doing well!

## 2015-06-29 NOTE — Progress Notes (Signed)
Pre visit review using our clinic review tool, if applicable. No additional management support is needed unless otherwise documented below in the visit note. 

## 2015-06-29 NOTE — Assessment & Plan Note (Signed)
Ref for 10 year screening (recall) colonoscopy

## 2015-07-06 ENCOUNTER — Encounter: Payer: Self-pay | Admitting: Cardiovascular Disease

## 2015-07-06 ENCOUNTER — Ambulatory Visit (INDEPENDENT_AMBULATORY_CARE_PROVIDER_SITE_OTHER): Payer: Medicare Other | Admitting: Cardiovascular Disease

## 2015-07-06 VITALS — BP 116/60 | HR 77 | Ht 63.0 in | Wt 130.8 lb

## 2015-07-06 DIAGNOSIS — I2584 Coronary atherosclerosis due to calcified coronary lesion: Secondary | ICD-10-CM

## 2015-07-06 DIAGNOSIS — E785 Hyperlipidemia, unspecified: Secondary | ICD-10-CM | POA: Diagnosis not present

## 2015-07-06 DIAGNOSIS — I447 Left bundle-branch block, unspecified: Secondary | ICD-10-CM | POA: Diagnosis not present

## 2015-07-06 DIAGNOSIS — I700 Atherosclerosis of aorta: Secondary | ICD-10-CM

## 2015-07-06 DIAGNOSIS — R079 Chest pain, unspecified: Secondary | ICD-10-CM

## 2015-07-06 DIAGNOSIS — I2510 Atherosclerotic heart disease of native coronary artery without angina pectoris: Secondary | ICD-10-CM

## 2015-07-06 NOTE — Patient Instructions (Signed)
You are doing well. No medication changes were made.  Please call us if you have new issues that need to be addressed before your next appt.  Your physician wants you to follow-up in: 12 months.  You will receive a reminder letter in the mail two months in advance. If you don't receive a letter, please call our office to schedule the follow-up appointment. 

## 2015-07-06 NOTE — Assessment & Plan Note (Signed)
Cholesterol is at goal on the current lipid regimen. No changes to the medications were made. If she develops myalgias, could change to Crestor

## 2015-07-06 NOTE — Assessment & Plan Note (Signed)
Noted on CT scan in the arch, descending aorta, moderate in intensity Continue aggressive cholesterol management

## 2015-07-06 NOTE — Progress Notes (Signed)
Patient ID: Tara Sloan, female    DOB: Apr 15, 1946, 69 y.o.   MRN: [id number]  HPI Comments: Tara Sloan is a very pleasant 69 year old woman with strong family history of coronary artery disease, patient of Dr. Glori Bickers, with PVD and coronary artery disease on CT scan. She does report remote smoking history for less than 10 years She presents today for follow-up of her CAD, PAD, hyperlipidemia  Lipitor previously increased from 40 mg up to 80 mg daily. She initially had a difficult time with myalgias, now doing much better. Episode of weakness, malaise March 2016. She went to the emergency room, workup was essentially normal. Symptoms lasted 20 minutes described as feeling hot, weak while in church. No further episodes since that time. Otherwise active with no complaints. Denies any significant shortness of breath or chest discomfort  EKG shows normal sinus rhythm with rate 77 bpm, no significant ST or T-wave changes  Other past medical history History of large cell non-Hodgkin's lymphoma. She was treated  in 2013 with chemotherapy and radiation treatment. Followup studies including full-body CT scan and PET scan did not show recurrent disease or metastases. CT scan did show coronary artery disease in the LAD, OM, RCA and aorta.  She does report having prior stress test in 2009 which showed no ischemia.  Review of her CT scans from September 2013 in March 2014 shows atherosclerosis/PVD in the aortic arch, descending thoracic aorta.   Also with mild CAD any proximal LAD, left circumflex and RCA. Difficult to determine extent given motion artifact. Left main was not well visualized in both CT scans given motion artifact. Otherwise normal size cardiac chambers.   prior EKG EKG shows normal sinus rhythm with rate 94 beats per minute with no significant ST or T wave changes. EKG today shows normal sinus rhythm with rate 74 beats per minute, left bundle branch block (new)  Father had first MI  in his mid 18s. He was a heavy smoker  Allergies  Allergen Reactions  . Pnu-Imune [Pneumococcal Polysaccharide Vaccine] Hives    Current Outpatient Prescriptions on File Prior to Visit  Medication Sig Dispense Refill  . acetaminophen (TYLENOL) 500 MG tablet Take 1,000 mg by mouth every 8 (eight) hours as needed for mild pain or headache.    . alendronate (FOSAMAX) 70 MG tablet TAKE 1 TABLET BY MOUTH EVERY 7 DAYS ON AN EMPTY STOMACH WITH A FULL GLASS OF WATER 12 tablet 3  . aspirin 81 MG tablet Take 81 mg by mouth every evening.     Marland Kitchen atorvastatin (LIPITOR) 80 MG tablet Take 1 tablet (80 mg total) by mouth daily. 90 tablet 3  . Calcium-Vitamin D-Vitamin K 500-100-40 MG-UNT-MCG CHEW Chew 2 tablets by mouth daily.     . Cholecalciferol (VITAMIN D-3) 1000 UNITS CAPS Take 2,000 Units by mouth daily.     . Multiple Vitamin (MULTIVITAMIN) tablet Take 1 tablet by mouth daily.      . naproxen sodium (ANAPROX) 220 MG tablet Take 220 mg by mouth 2 (two) times daily as needed (for pain). PAIN     . omeprazole (PRILOSEC) 20 MG capsule TAKE 1 CAPSULE BY MOUTH DAILY AS NEEDED FOR HEARTBURN 90 capsule 3  . [DISCONTINUED] prochlorperazine (COMPAZINE) 10 MG tablet Take 1 tablet (10 mg total) by mouth every 6 (six) hours as needed (Nausea or vomiting). 30 tablet 6   No current facility-administered medications on file prior to visit.    Past Medical History  Diagnosis Date  .  HLD (hyperlipidemia)   . GERD (gastroesophageal reflux disease)   . Allergic rhinitis   . External hemorrhoid   . Osteopenia   . History of radiation therapy 03/22/12 - 04/09/12    right oropharynx/neck  . LBBB (left bundle branch block) dx'd 05/2014  . Arthritis     "hands; probably qwhere" (03/12/2015)  . Chronic back pain   . 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 Non Hodgkin's lymphoma   . Basal cell carcinoma ~ 2010    "leg"    Past Surgical History  Procedure Laterality Date  . Dilation  and curettage of uterus  1987  . Tubal ligation  1987  . Colonoscopy  5/06    Ext. hemms  . Tonsillectomy  11/07/2011    Procedure: TONSILLECTOMY;  Surgeon: Rozetta Nunnery, MD;  Location: [facility]  Service: ENT;  Laterality: N/A;  . Tonsillectomy  10/2011    "right was cancerous"  . Breast biopsy Left 12/03    Negative  . Abdominal hysterectomy  1988    "partial; for fibroids"  . Basal cell carcinoma excision Right ~ 2010    "leg"    Social History  reports that she quit smoking about 40 years ago. Her smoking use included Cigarettes. She has a 2.5 pack-year smoking history. She has never used smokeless tobacco. She reports that she drinks alcohol. She reports that she does not use illicit drugs.  Family History family history includes Breast cancer in her paternal aunt; Cancer in her paternal aunt and paternal aunt; Heart attack (age of onset: 14) in her father; Heart disease in her father; Hyperlipidemia in an other family member; Stroke (age of onset: 72) in her mother.   Review of Systems  Constitutional: Negative.   Respiratory: Negative.   Cardiovascular: Negative.   Gastrointestinal: Negative.   Musculoskeletal: Negative.   Neurological: Negative.   Hematological: Negative.   Psychiatric/Behavioral: Negative.   All other systems reviewed and are negative.   BP 116/60 mmHg  Pulse 77  Ht 5\' 3"  (1.6 m)  Wt 130 lb 12 oz (59.308 kg)  BMI 23.17 kg/m2  Physical Exam  Constitutional: She is oriented to person, place, and time. She appears well-developed and well-nourished.  HENT:  Head: Normocephalic.  Nose: Nose normal.  Mouth/Throat: Oropharynx is clear and moist.  Eyes: Conjunctivae are normal. Pupils are equal, round, and reactive to light.  Neck: Normal range of motion. Neck supple. No JVD present.  Cardiovascular: Normal rate, regular rhythm, S1 normal, S2 normal, normal heart sounds and intact distal pulses.  Exam reveals no gallop and no  friction rub.   No murmur heard. Pulmonary/Chest: Effort normal and breath sounds normal. No respiratory distress. She has no wheezes. She has no rales. She exhibits no tenderness.  Abdominal: Soft. Bowel sounds are normal. She exhibits no distension. There is no tenderness.  Musculoskeletal: Normal range of motion. She exhibits no edema or tenderness.  Lymphadenopathy:    She has no cervical adenopathy.  Neurological: She is alert and oriented to person, place, and time. Coordination normal.  Skin: Skin is warm and dry. No rash noted. No erythema.  Psychiatric: She has a normal mood and affect. Her behavior is normal. Judgment and thought content normal.    Assessment and Plan  Nursing note and vitals reviewed.

## 2015-07-06 NOTE — Assessment & Plan Note (Signed)
No recent episodes of chest pain concerning for angina. No further workup at this time Recommended she call us if she has any recurrent symptoms

## 2015-07-06 NOTE — Assessment & Plan Note (Signed)
Currently with no symptoms of angina. No further workup at this time. Continue current medication regimen. 

## 2015-08-14 ENCOUNTER — Other Ambulatory Visit: Payer: Self-pay

## 2015-08-14 DIAGNOSIS — Z1231 Encounter for screening mammogram for malignant neoplasm of breast: Secondary | ICD-10-CM

## 2015-08-29 ENCOUNTER — Ambulatory Visit (AMBULATORY_SURGERY_CENTER): Payer: Self-pay

## 2015-08-29 VITALS — Ht 63.0 in | Wt 130.4 lb

## 2015-08-29 DIAGNOSIS — Z1211 Encounter for screening for malignant neoplasm of colon: Secondary | ICD-10-CM

## 2015-08-29 NOTE — Progress Notes (Signed)
No allergies to eggs or soy No diet/weight loss meds No home oxygen No past problems with anesthesia  Refused emmi 

## 2015-09-04 ENCOUNTER — Ambulatory Visit (INDEPENDENT_AMBULATORY_CARE_PROVIDER_SITE_OTHER): Payer: Medicare Other | Admitting: Family Medicine

## 2015-09-04 ENCOUNTER — Encounter: Payer: Self-pay | Admitting: Family Medicine

## 2015-09-04 VITALS — BP 120/68 | HR 73 | Temp 97.8°F | Ht 63.0 in | Wt 130.5 lb

## 2015-09-04 DIAGNOSIS — J209 Acute bronchitis, unspecified: Secondary | ICD-10-CM | POA: Diagnosis not present

## 2015-09-04 MED ORDER — DOXYCYCLINE HYCLATE 100 MG PO TABS: 100.0000 mg | ORAL_TABLET | Freq: Two times a day (BID) | ORAL | Status: DC

## 2015-09-04 NOTE — Progress Notes (Signed)
   Subjective:    Patient ID: Tara Sloan, female    DOB: 1946/01/23, 69 y.o.   MRN: [id number]  Cough This is a new problem. The current episode started 1 to 4 weeks ago (3 weeks). The problem has been gradually worsening. The cough is productive of sputum. Associated symptoms include headaches, nasal congestion and a sore throat. Pertinent negatives include no ear congestion, ear pain, myalgias, shortness of breath or wheezing. Associated symptoms comments: Fatigue  sinus pressure. Nothing aggravates the symptoms. Risk factors for lung disease include smoking/tobacco exposure (remote smoker for short term). Treatments tried: zyrtec, flaonase, mucinex. The treatment provided no relief. Her past medical history is significant for environmental allergies. There is no history of asthma, COPD, emphysema or pneumonia.    PMH: lymphoma, no currently on chemo, complete in 2013.Marland Kitchen  Review of Systems  HENT: Positive for sore throat. Negative for ear pain.   Respiratory: Positive for cough. Negative for shortness of breath and wheezing.   Musculoskeletal: Negative for myalgias.  Allergic/Immunologic: Positive for environmental allergies.  Neurological: Positive for headaches.       Objective:   Physical Exam  Constitutional: Vital signs are normal. She appears well-developed and well-nourished. She is cooperative.  Non-toxic appearance. She does not appear ill. No distress.  HENT:  Head: Normocephalic.  Right Ear: Hearing, external ear and ear canal normal. Tympanic membrane is not erythematous, not retracted and not bulging. A middle ear effusion is present.  Left Ear: Hearing, external ear and ear canal normal. Tympanic membrane is not erythematous, not retracted and not bulging. A middle ear effusion is present.  Nose: Mucosal edema and rhinorrhea present. Right sinus exhibits no maxillary sinus tenderness and no frontal sinus tenderness. Left sinus exhibits no maxillary sinus tenderness  and no frontal sinus tenderness.  Mouth/Throat: Uvula is midline, oropharynx is clear and moist and mucous membranes are normal.  Eyes: Conjunctivae, EOM and lids are normal. Pupils are equal, round, and reactive to light. Lids are everted and swept, no foreign bodies found.  Neck: Trachea normal and normal range of motion. Neck supple. Carotid bruit is not present. No thyroid mass and no thyromegaly present.  Cardiovascular: Normal rate, regular rhythm, S1 normal, S2 normal, normal heart sounds, intact distal pulses and normal pulses.  Exam reveals no gallop and no friction rub.   No murmur heard. Pulmonary/Chest: Effort normal. No tachypnea. No respiratory distress. She has no decreased breath sounds. She has no wheezes. She has rhonchi. She has no rales.  Moist diffuse rhonchi.. Coughs frequently on exhalation.  Neurological: She is alert.  Skin: Skin is warm, dry and intact. No rash noted.  Psychiatric: Her speech is normal and behavior is normal. Judgment normal. Her mood appears not anxious. Cognition and memory are normal. She does not exhibit a depressed mood.          Assessment & Plan:

## 2015-09-04 NOTE — Patient Instructions (Addendum)
Complete antibiotics.  Mucinex DM twice daily.  Increase flonase to 2 spray per nostril daily.  Increase water, fluids. Rest.  If not improving in 48-72 hours.   Go to ER for severe shortness of breath.

## 2015-09-04 NOTE — Progress Notes (Signed)
Pre visit review using our clinic review tool, if applicable. No additional management support is needed unless otherwise documented below in the visit note. 

## 2015-09-04 NOTE — Assessment & Plan Note (Signed)
Treat with doxy x 10 days  To cover atypical PNA.  Mucolytic, increase flonase as congestion behind ears.

## 2015-09-12 ENCOUNTER — Ambulatory Visit (AMBULATORY_SURGERY_CENTER): Payer: Medicare Other | Admitting: Internal Medicine

## 2015-09-12 ENCOUNTER — Encounter: Payer: Self-pay | Admitting: Internal Medicine

## 2015-09-12 VITALS — BP 105/65 | HR 72 | Temp 98.1°F | Resp 19 | Ht 63.0 in | Wt 130.0 lb

## 2015-09-12 DIAGNOSIS — Z1211 Encounter for screening for malignant neoplasm of colon: Secondary | ICD-10-CM | POA: Diagnosis present

## 2015-09-12 MED ORDER — SODIUM CHLORIDE 0.9 % IV SOLN: 500.0000 mL | INTRAVENOUS | Status: DC

## 2015-09-12 NOTE — Progress Notes (Signed)
Patient awakening,vss,report to rn 

## 2015-09-12 NOTE — Op Note (Signed)
[place]  [address] [place], [address]   COLONOSCOPY PROCEDURE REPORT  PATIENT: Tara Sloan, Tara Sloan  MR#: [id number] BIRTHDATE: 1946/02/04 , 68  yrs. old GENDER: female ENDOSCOPIST: Gatha Mayer, MD, [facility] PROCEDURE DATE:  09/12/2015 PROCEDURE:   Colonoscopy, screening First Screening Colonoscopy - Avg.  risk and is 50 yrs.  old or older - No.  Prior Negative Screening - Now for repeat screening. 10 or more years since last screening  History of Adenoma - Now for follow-up colonoscopy & has been > or = to 3 yrs.  N/A  Polyps removed today? No Recommend repeat exam, <10 yrs? No ASA CLASS:   Class II INDICATIONS:Screening for colonic neoplasia and Colorectal Neoplasm Risk Assessment for this procedure is average risk. MEDICATIONS: Propofol 200 mg IV and Monitored anesthesia care  DESCRIPTION OF PROCEDURE:   After the risks benefits and alternatives of the procedure were thoroughly explained, informed consent was obtained.  The digital rectal exam revealed no abnormalities of the rectum.   The [id number]  endoscope was introduced through the anus and advanced to the cecum, which was identified by both the appendix and ileocecal valve. No adverse events experienced.   The quality of the prep was excellent. (MiraLax was used)  The instrument was then slowly withdrawn as the colon was fully examined. Estimated blood loss is zero unless otherwise noted in this procedure report.      COLON FINDINGS: There was severe diverticulosis noted in the sigmoid colon with associated luminal narrowing, muscular hypertrophy and angulation.   The examination was otherwise normal.  Retroflexed views revealed no abnormalities. The time to cecum = 9.0 Withdrawal time = 7.4   The scope was withdrawn and the procedure completed. COMPLICATIONS: There were no immediate complications.  ENDOSCOPIC IMPRESSION: 1.   There was severe diverticulosis noted in the sigmoid  colon - necessitated changing to pediatric scope to pass 2.   The examination was otherwise normal - excellent prep - second screening  RECOMMENDATIONS: Routine repeat colonoscopy screening not necessary.  See me/GI as needed. Consider non-invasive screening in 2026 - would be 78 then so optional  eSigned:  Gatha Mayer, MD, [facility] 09/12/2015 11:21 AM   cc: The Patient

## 2015-09-12 NOTE — Patient Instructions (Addendum)
No polyps! You do have diverticulosis - thickened muscle rings and pouches in the colon wall. Please read the handout about this condition.  I do not think you will need any further routine colonoscopy as part of colon cancer screening/prevention at your age. 10 yrs from now you will be 73 - maybe consider a non-invasive test for screening then.  I appreciate the opportunity to care for you. Gatha Mayer, MD, FACG       YOU HAD AN ENDOSCOPIC PROCEDURE TODAY AT [facility]:   Refer to the procedure report that was given to you for any specific questions about what was found during the examination.  If the procedure report does not answer your questions, please call your gastroenterologist to clarify.  If you requested that your care partner not be given the details of your procedure findings, then the procedure report has been included in a sealed envelope for you to review at your convenience later.  YOU SHOULD EXPECT: Some feelings of bloating in the abdomen. Passage of more gas than usual.  Walking can help get rid of the air that was put into your GI tract during the procedure and reduce the bloating. If you had a lower endoscopy (such as a colonoscopy or flexible sigmoidoscopy) you may notice spotting of blood in your stool or on the toilet paper. If you underwent a bowel prep for your procedure, you may not have a normal bowel movement for a few days.  Please Note:  You might notice some irritation and congestion in your nose or some drainage.  This is from the oxygen used during your procedure.  There is no need for concern and it should clear up in a day or so.  SYMPTOMS TO REPORT IMMEDIATELY:   Following lower endoscopy (colonoscopy or flexible sigmoidoscopy):  Excessive amounts of blood in the stool  Significant tenderness or worsening of abdominal pains  Swelling of the abdomen that is new, acute  Fever of 100F or higher    For urgent or emergent  issues, a gastroenterologist can be reached at any hour by calling [phone].   DIET: Your first meal following the procedure should be a small meal and then it is ok to progress to your normal diet. Heavy or fried foods are harder to digest and may make you feel nauseous or bloated.  Likewise, meals heavy in dairy and vegetables can increase bloating.  Drink plenty of fluids but you should avoid alcoholic beverages for 24 hours.  ACTIVITY:  You should plan to take it easy for the rest of today and you should NOT DRIVE or use heavy machinery until tomorrow (because of the sedation medicines used during the test).    FOLLOW UP: Our staff will call the number listed on your records the next business day following your procedure to check on you and address any questions or concerns that you may have regarding the information given to you following your procedure. If we do not reach you, we will leave a message.  However, if you are feeling well and you are not experiencing any problems, there is no need to return our call.  We will assume that you have returned to your regular daily activities without incident.  If any biopsies were taken you will be contacted by phone or by letter within the next 1-3 weeks.  Please call us at [phone] if you have not heard about the biopsies in 3 weeks.  SIGNATURES/CONFIDENTIALITY: You and/or your care partner have signed paperwork which will be entered into your electronic medical record.  These signatures attest to the fact that that the information above on your After Visit Summary has been reviewed and is understood.  Full responsibility of the confidentiality of this discharge information lies with you and/or your care-partner.   Resume medications. Information given on diverticulosis.

## 2015-09-13 ENCOUNTER — Telehealth: Payer: Self-pay

## 2015-09-13 NOTE — Telephone Encounter (Signed)
  Follow up Call-  Call back number 09/12/2015  Post procedure Call Back phone  # [phone]  Permission to leave phone message Yes     Patient questions:  Do you have a fever, pain , or abdominal swelling? No. Pain Score  0 *  Have you tolerated food without any problems? Yes.    Have you been able to return to your normal activities? Yes.    Do you have any questions about your discharge instructions: Diet   No. Medications  No. Follow up visit  No.  Do you have questions or concerns about your Care? No.  Actions: * If pain score is 4 or above: No action needed, pain <4.

## 2015-09-17 ENCOUNTER — Ambulatory Visit
Admission: RE | Admit: 2015-09-17 | Discharge: 2015-09-17 | Disposition: A | Discharge status: Home / Self Care | Payer: Medicare Other | Source: Ambulatory Visit

## 2015-09-17 DIAGNOSIS — Z1231 Encounter for screening mammogram for malignant neoplasm of breast: Secondary | ICD-10-CM

## 2015-10-30 ENCOUNTER — Ambulatory Visit (INDEPENDENT_AMBULATORY_CARE_PROVIDER_SITE_OTHER): Payer: Medicare Other

## 2015-10-30 DIAGNOSIS — Z23 Encounter for immunization: Secondary | ICD-10-CM

## 2016-01-17 ENCOUNTER — Telehealth: Payer: Self-pay | Admitting: Hematology and Oncology

## 2016-01-17 NOTE — Telephone Encounter (Signed)
Spoke to patient and she is aware of the change to her appointment date/time due to the provider being on call.

## 2016-02-11 ENCOUNTER — Encounter: Payer: Self-pay | Admitting: Family Medicine

## 2016-02-11 ENCOUNTER — Ambulatory Visit (INDEPENDENT_AMBULATORY_CARE_PROVIDER_SITE_OTHER): Payer: Medicare Other | Admitting: Family Medicine

## 2016-02-11 VITALS — BP 152/74 | HR 82 | Temp 100.6°F | Ht 63.0 in | Wt 133.2 lb

## 2016-02-11 DIAGNOSIS — R509 Fever, unspecified: Secondary | ICD-10-CM | POA: Diagnosis not present

## 2016-02-11 DIAGNOSIS — J101 Influenza due to other identified influenza virus with other respiratory manifestations: Secondary | ICD-10-CM | POA: Diagnosis not present

## 2016-02-11 LAB — POCT INFLUENZA A/B
Influenza A, POC: POSITIVE — AB
Influenza B, POC: NEGATIVE

## 2016-02-11 MED ORDER — BENZONATATE 200 MG PO CAPS: 200.0000 mg | ORAL_CAPSULE | Freq: Three times a day (TID) | ORAL | Status: DC | PRN

## 2016-02-11 MED ORDER — OSELTAMIVIR PHOSPHATE 75 MG PO CAPS: 75.0000 mg | ORAL_CAPSULE | Freq: Two times a day (BID) | ORAL | Status: DC

## 2016-02-11 MED ORDER — GUAIFENESIN-CODEINE 100-10 MG/5ML PO SYRP: 5.0000 mL | ORAL_SOLUTION | Freq: Four times a day (QID) | ORAL | Status: DC | PRN

## 2016-02-11 NOTE — Patient Instructions (Signed)
You have the flu  Rest/drink fluids Aleve 1-2 pills twice daily with food will help fever and aches Tylenol 2 pills up to every 4 hours will also help fever and aches  For cough-tessalon pills and robitussin with codeine (caution of sedation with codeine)  tamiflu to help the virus -as directed    Update if not starting to improve in a week or if worsening

## 2016-02-11 NOTE — Progress Notes (Signed)
Subjective:    Patient ID: Tara Sloan, female    DOB: 13-Sep-1946, 70 y.o.   MRN: [id number]  HPI Here with flu symptoms   Pos flu swab today- for influenza A   Coughing sat  Felt bad yesterday pnd  Started body aches yesterday afternoon Temp 100.6 (significant for her) Last med - tylenol and mucinex at 5 am   Cough is a little prod - phlegm- ? Color  Chest and throat are sore  No rash or stomach symptoms  Runny /stuffy nose  Also a headache   Patient Active Problem List   Diagnosis Date Noted  . Acute bronchitis 09/04/2015  . Aortic atherosclerosis ([place]) 07/06/2015  . Colon cancer screening 06/29/2015  . Routine general medical examination at a health care facility 06/21/2015  . Chest pain 03/11/2015  . Left bundle branch block (LBBB) 07/06/2014  . Encounter for Medicare annual wellness exam 03/17/2013  . 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 Pain in joint, ankle and foot 03/16/2013  . Hyperglycemia 12/01/2011  . Diffuse large B cell lymphoma ([place]) 11/22/2011  . ARTHRITIS, HANDS, BILATERAL 12/12/2010  . Dyslipidemia 04/02/2008  . ALLERGIC RHINITIS 04/02/2008  . GERD 04/02/2008  . Osteopenia 09/13/2007  . ALLERGY 08/06/2007   Past Medical History  Diagnosis Date  . HLD (hyperlipidemia)   . GERD (gastroesophageal reflux disease)   . Allergic rhinitis   . External hemorrhoid   . Osteopenia   . History of radiation therapy 03/22/12 - 04/09/12    right oropharynx/neck  . LBBB (left bundle branch block) dx'd 05/2014  . Arthritis     "hands; probably qwhere" (03/12/2015)  . Chronic back pain   . Diffuse large B cell lymphoma ([place]) 11/07/11    dx from tonsilar biopsy  . Non Hodgkin's lymphoma ([place])   . Basal cell carcinoma ~ 2010    "leg"   Past Surgical History  Procedure Laterality Date  . Dilation and curettage of uterus  1987  . Tubal ligation  1987  . Colonoscopy  5/06    Ext. hemms  . Tonsillectomy  11/07/2011    Procedure: TONSILLECTOMY;   Surgeon: Rozetta Nunnery, MD;  Location: [facility];  Service: ENT;  Laterality: N/A;  . Tonsillectomy  10/2011    "right was cancerous"  . Breast biopsy Left 12/03    Negative  . Abdominal hysterectomy  1988    "partial; for fibroids"  . Basal cell carcinoma excision Right ~ 2010    "leg"   Social History  Substance Use Topics  . Smoking status: Former Smoker -- 0.25 packs/day for 10 years    Types: Cigarettes    Quit date: 11/04/1974  . Smokeless tobacco: Never Used  . Alcohol Use: 0.0 oz/week    0 Standard drinks or equivalent per week     Comment: 03/12/2015 "last drink was awhile ago; might have a couple drinks/yr at most"   Family History  Problem Relation Age of Onset  . Heart attack Father 62  . Heart disease Father   . Stroke Mother 82  . Breast cancer Paternal Aunt   . Cancer Paternal Aunt   . Hyperlipidemia      Family history  . Cancer Paternal Aunt   . Colon cancer Neg Hx   . Colon polyps Neg Hx    Allergies  Allergen Reactions  . Pnu-Imune [Pneumococcal Polysaccharide Vaccine] Hives   Current Outpatient Prescriptions on File Prior to Visit  Medication Sig Dispense Refill  .  acetaminophen (TYLENOL) 500 MG tablet Take 1,000 mg by mouth every 8 (eight) hours as needed for mild pain or headache.    . alendronate (FOSAMAX) 70 MG tablet TAKE 1 TABLET BY MOUTH EVERY 7 DAYS ON AN EMPTY STOMACH WITH A FULL GLASS OF WATER 12 tablet 3  . aspirin 81 MG tablet Take 81 mg by mouth every evening.     Marland Kitchen atorvastatin (LIPITOR) 80 MG tablet Take 1 tablet (80 mg total) by mouth daily. 90 tablet 3  . Calcium-Vitamin D-Vitamin K 500-100-40 MG-UNT-MCG CHEW Chew 2 tablets by mouth daily.     . Cholecalciferol (VITAMIN D-3) 1000 UNITS CAPS Take 2,000 Units by mouth daily.     Marland Kitchen doxycycline (VIBRA-TABS) 100 MG tablet Take 1 tablet (100 mg total) by mouth 2 (two) times daily. 20 tablet 0  . Multiple Vitamin (MULTIVITAMIN) tablet Take 1 tablet by mouth daily.        . naproxen sodium (ANAPROX) 220 MG tablet Take 220 mg by mouth 2 (two) times daily as needed (for pain). PAIN     . omeprazole (PRILOSEC) 20 MG capsule TAKE 1 CAPSULE BY MOUTH DAILY AS NEEDED FOR HEARTBURN 90 capsule 3  . [DISCONTINUED] prochlorperazine (COMPAZINE) 10 MG tablet Take 1 tablet (10 mg total) by mouth every 6 (six) hours as needed (Nausea or vomiting). 30 tablet 6   No current facility-administered medications on file prior to visit.    Review of Systems  Constitutional: Positive for fever, chills, appetite change and fatigue.  HENT: Positive for congestion, postnasal drip, rhinorrhea, sinus pressure, sneezing and sore throat. Negative for ear pain.   Eyes: Negative for pain and discharge.  Respiratory: Positive for cough. Negative for shortness of breath, wheezing and stridor.   Cardiovascular: Negative for chest pain.  Gastrointestinal: Negative for nausea, vomiting and diarrhea.  Genitourinary: Negative for urgency, frequency and hematuria.  Musculoskeletal: Negative for myalgias and arthralgias.  Skin: Negative for rash.  Neurological: Positive for headaches. Negative for dizziness, weakness and light-headedness.  Psychiatric/Behavioral: Negative for confusion and dysphoric mood.       Objective:   Physical Exam  Constitutional: She appears well-developed and well-nourished. No distress.  Fatigued appearing   HENT:  Head: Normocephalic and atraumatic.  Right Ear: External ear normal.  Left Ear: External ear normal.  Mouth/Throat: Oropharynx is clear and moist.  Nares are injected and congested  No sinus tenderness Clear rhinorrhea and post nasal drip   Eyes: Conjunctivae and EOM are normal. Pupils are equal, round, and reactive to light. Right eye exhibits no discharge. Left eye exhibits no discharge.  Neck: Normal range of motion. Neck supple.  Cardiovascular: Normal rate and normal heart sounds.   Pulmonary/Chest: Effort normal and breath sounds normal. No  respiratory distress. She has no wheezes. She has no rales. She exhibits no tenderness.  occ dry cough  Lymphadenopathy:    She has no cervical adenopathy.  Neurological: She is alert.  Skin: Skin is warm and dry. No rash noted.  Psychiatric: She has a normal mood and affect.  Pleasant           Assessment & Plan:   Problem List Items Addressed This Visit      Respiratory   Influenza A    With respiratory symptoms and body aches /fever  Pt did have a flu shot  Reassuring exam Px tamiflu (in window), tessalon and robitussin ac  Disc caution of sedation  Fluids/rest Disc symptomatic care - see instructions on AVS  Handout given  Update if not starting to improve in a week or if worsening        Relevant Medications   oseltamivir (TAMIFLU) 75 MG capsule    Other Visit Diagnoses    Fever, unspecified    -  Primary    Relevant Orders    POCT Influenza A/B (Completed)

## 2016-02-11 NOTE — Assessment & Plan Note (Signed)
With respiratory symptoms and body aches /fever  Pt did have a flu shot  Reassuring exam Px tamiflu (in window), tessalon and robitussin ac  Disc caution of sedation  Fluids/rest Disc symptomatic care - see instructions on AVS  Handout given  Update if not starting to improve in a week or if worsening

## 2016-02-11 NOTE — Progress Notes (Signed)
Pre visit review using our clinic review tool, if applicable. No additional management support is needed unless otherwise documented below in the visit note. 

## 2016-03-07 ENCOUNTER — Ambulatory Visit: Payer: Self-pay | Admitting: Hematology and Oncology

## 2016-03-07 ENCOUNTER — Other Ambulatory Visit: Payer: Self-pay

## 2016-03-10 ENCOUNTER — Ambulatory Visit (HOSPITAL_BASED_OUTPATIENT_CLINIC_OR_DEPARTMENT_OTHER): Payer: Medicare Other | Admitting: Hematology and Oncology

## 2016-03-10 ENCOUNTER — Telehealth: Payer: Self-pay | Admitting: Hematology and Oncology

## 2016-03-10 ENCOUNTER — Encounter: Payer: Self-pay | Admitting: Hematology and Oncology

## 2016-03-10 ENCOUNTER — Other Ambulatory Visit (HOSPITAL_BASED_OUTPATIENT_CLINIC_OR_DEPARTMENT_OTHER): Payer: Medicare Other

## 2016-03-10 ENCOUNTER — Other Ambulatory Visit: Payer: Self-pay | Admitting: Hematology and Oncology

## 2016-03-10 VITALS — BP 112/57 | HR 80 | Temp 98.2°F | Resp 18 | Ht 63.0 in | Wt 137.2 lb

## 2016-03-10 DIAGNOSIS — C8330 Diffuse large B-cell lymphoma, unspecified site: Secondary | ICD-10-CM

## 2016-03-10 DIAGNOSIS — C8331 Diffuse large B-cell lymphoma, lymph nodes of head, face, and neck: Secondary | ICD-10-CM

## 2016-03-10 DIAGNOSIS — Z8572 Personal history of non-Hodgkin lymphomas: Secondary | ICD-10-CM | POA: Diagnosis not present

## 2016-03-10 LAB — COMPREHENSIVE METABOLIC PANEL
ALT: 18 U/L (ref 0–55)
AST: 21 U/L (ref 5–34)
Albumin: 3.7 g/dL (ref 3.5–5.0)
Alkaline Phosphatase: 52 U/L (ref 40–150)
Anion Gap: 7 mEq/L (ref 3–11)
BUN: 26.7 mg/dL — ABNORMAL HIGH (ref 7.0–26.0)
CO2: 27 mEq/L (ref 22–29)
Calcium: 8.8 mg/dL (ref 8.4–10.4)
Chloride: 108 mEq/L (ref 98–109)
Creatinine: 1.1 mg/dL (ref 0.6–1.1)
EGFR: 50 mL/min/{1.73_m2} — ABNORMAL LOW (ref >90)
Glucose: 102 mg/dl (ref 70–140)
Potassium: 4.2 mEq/L (ref 3.5–5.1)
Sodium: 142 mEq/L (ref 136–145)
Total Bilirubin: 0.56 mg/dL (ref 0.20–1.20)
Total Protein: 6.3 g/dL — ABNORMAL LOW (ref 6.4–8.3)

## 2016-03-10 LAB — CBC WITH DIFFERENTIAL/PLATELET
BASO%: 0.4 % (ref 0.0–2.0)
Basophils Absolute: 0 10*3/uL (ref 0.0–0.1)
EOS%: 1.3 % (ref 0.0–7.0)
Eosinophils Absolute: 0.1 10*3/uL (ref 0.0–0.5)
HCT: 38.3 % (ref 34.8–46.6)
HGB: 12.8 g/dL (ref 11.6–15.9)
LYMPH%: 29.7 % (ref 14.0–49.7)
MCH: 30.1 pg (ref 25.1–34.0)
MCHC: 33.4 g/dL (ref 31.5–36.0)
MCV: 90.1 fL (ref 79.5–101.0)
MONO#: 0.9 10*3/uL (ref 0.1–0.9)
MONO%: 13.5 % (ref 0.0–14.0)
NEUT#: 3.8 10*3/uL (ref 1.5–6.5)
NEUT%: 55.1 % (ref 38.4–76.8)
Platelets: 166 10*3/uL (ref 145–400)
RBC: 4.25 10*6/uL (ref 3.70–5.45)
RDW: 13.9 % (ref 11.2–14.5)
WBC: 6.8 10*3/uL (ref 3.9–10.3)
lymph#: 2 10*3/uL (ref 0.9–3.3)

## 2016-03-10 LAB — LACTATE DEHYDROGENASE: LDH: 178 U/L (ref 125–245)

## 2016-03-10 NOTE — Progress Notes (Signed)
[place] OFFICE PROGRESS NOTE  Patient Care Team: Abner Greenspan, MD as PCP - General  SUMMARY OF ONCOLOGIC HISTORY:  This is a pleasant lady who was diagnosed with lymphoma. According to the patient, she fell an abnormal lump in the tonsil area that prompted an evaluation around July of 2012. She was first diagnosed with streptococcal infection treated with antibiotics that never went away. 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 She subsequently underwent further staging bone marrow aspirate and biopsy, and was subsequently found to have stage I disease In 2013, she completed 3 cycles of R. CHOP chemotherapy. She had profound fatigue and feeling unwell while on chemotherapy including nausea. 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 She denies new lymphadenopathy. She had recent influenza infection, successfully treated. Denies chest pain or shortness of breath She had minor cold sensitivity but no neuropathy  REVIEW OF SYSTEMS:   Constitutional: Denies fevers, chills or abnormal weight loss Eyes: Denies blurriness of vision Ears, nose, mouth, throat, and face: Denies mucositis or sore throat Respiratory: Denies cough, dyspnea or wheezes Cardiovascular: Denies palpitation, chest discomfort or lower extremity swelling Gastrointestinal:  Denies nausea, heartburn or change in bowel habits Skin: Denies abnormal skin rashes Lymphatics: Denies new lymphadenopathy or easy bruising Neurological:Denies numbness, tingling or new weaknesses Behavioral/Psych: Mood is stable, no new changes  All other systems were reviewed with the patient and  are negative.  I have reviewed the past medical history, past surgical history, social history and family history with the patient and they are unchanged from previous note.  ALLERGIES:  is allergic to pnu-imune.  MEDICATIONS:  Current Outpatient Prescriptions  Medication Sig Dispense Refill  . acetaminophen (TYLENOL) 500 MG tablet Take 1,000 mg by mouth every 8 (eight) hours as needed for mild pain or headache.    . alendronate (FOSAMAX) 70 MG tablet TAKE 1 TABLET BY MOUTH EVERY 7 DAYS ON AN EMPTY STOMACH WITH A FULL GLASS OF WATER 12 tablet 3  . aspirin 81 MG tablet Take 81 mg by mouth every evening.     Marland Kitchen atorvastatin (LIPITOR) 80 MG tablet Take 1 tablet (80 mg total) by mouth daily. 90 tablet 3  . Calcium-Vitamin D-Vitamin K 500-100-40 MG-UNT-MCG CHEW Chew 2 tablets by mouth daily.     . Cholecalciferol (VITAMIN D-3) 1000 UNITS CAPS Take 2,000 Units by mouth daily.     . Multiple Vitamin (MULTIVITAMIN) tablet Take 1 tablet by mouth daily.      . naproxen sodium (ANAPROX) 220 MG tablet Take 220 mg by mouth 2 (two) times daily as needed (for pain). PAIN     . omeprazole (PRILOSEC) 20 MG capsule TAKE 1 CAPSULE BY MOUTH DAILY AS NEEDED FOR HEARTBURN 90 capsule 3  . [DISCONTINUED] prochlorperazine (COMPAZINE) 10 MG tablet Take 1 tablet (10 mg total) by mouth every 6 (six) hours as needed (Nausea or vomiting). 30 tablet 6   No current facility-administered medications for this visit.    PHYSICAL EXAMINATION: ECOG PERFORMANCE STATUS: 0 - Asymptomatic  Filed Vitals:   03/10/16 1454  BP: 112/57  Pulse: 80  Temp: 98.2 F (36.8 C)  Resp: 18   Filed Weights   03/10/16 1454  Weight: 137  lb 3.2 oz (62.234 kg)    GENERAL:alert, no distress and comfortable SKIN: skin color, texture, turgor are normal, no rashes or significant lesions EYES: normal, Conjunctiva are pink and non-injected, sclera clear OROPHARYNX:no exudate, no erythema and lips, buccal mucosa, and tongue normal  NECK:  supple, thyroid normal size, non-tender, without nodularity LYMPH:  no palpable lymphadenopathy in the cervical, axillary or inguinal LUNGS: clear to auscultation and percussion with normal breathing effort HEART: regular rate & rhythm and no murmurs and no lower extremity edema ABDOMEN:abdomen soft, non-tender and normal bowel sounds Musculoskeletal:no cyanosis of digits and no clubbing  NEURO: alert & oriented x 3 with fluent speech, no focal motor/sensory deficits  LABORATORY DATA:  I have reviewed the data as listed    Component Value Date/Time   NA 142 03/10/2016 1446   NA 143 06/22/2015 0848   NA 145 03/03/2012 1201   K 4.2 03/10/2016 1446   K 3.9 06/22/2015 0848   K 3.8 03/03/2012 1201   CL 108 06/22/2015 0848   CL 105 03/08/2013 0851   CL 99 03/03/2012 1201   CO2 27 03/10/2016 1446   CO2 26 06/22/2015 0848   CO2 28 03/03/2012 1201   GLUCOSE 102 03/10/2016 1446   GLUCOSE 101* 06/22/2015 0848   GLUCOSE 108* 03/08/2013 0851   GLUCOSE 112 03/03/2012 1201   BUN 26.7* 03/10/2016 1446   BUN 23 06/22/2015 0848   BUN 19 03/03/2012 1201   CREATININE 1.1 03/10/2016 1446   CREATININE 0.89 06/22/2015 0848   CREATININE 0.8 03/03/2012 1201   CALCIUM 8.8 03/10/2016 1446   CALCIUM 9.3 06/22/2015 0848   CALCIUM 9.1 03/03/2012 1201   PROT 6.3* 03/10/2016 1446   PROT 6.4 06/22/2015 0848   PROT 6.8 03/03/2012 1201   ALBUMIN 3.7 03/10/2016 1446   ALBUMIN 3.9 06/22/2015 0848   ALBUMIN 3.7 03/03/2012 1201   AST 21 03/10/2016 1446   AST 21 06/22/2015 0848   AST 50* 03/03/2012 1201   ALT 18 03/10/2016 1446   ALT 18 06/22/2015 0848   ALT 64* 03/03/2012 1201   ALKPHOS 52 03/10/2016 1446   ALKPHOS 52 06/22/2015 0848   ALKPHOS 71 03/03/2012 1201   BILITOT 0.56 03/10/2016 1446   BILITOT 0.9 06/22/2015 0848   BILITOT 0.60 03/03/2012 1201   GFRNONAA 65* 03/11/2015 1656   GFRAA 75* 03/11/2015 1656    No results found for: SPEP, UPEP  Lab Results  Component Value Date   WBC 6.8  03/10/2016   NEUTROABS 3.8 03/10/2016   HGB 12.8 03/10/2016   HCT 38.3 03/10/2016   MCV 90.1 03/10/2016   PLT 166 03/10/2016      Chemistry      Component Value Date/Time   NA 142 03/10/2016 1446   NA 143 06/22/2015 0848   NA 145 03/03/2012 1201   K 4.2 03/10/2016 1446   K 3.9 06/22/2015 0848   K 3.8 03/03/2012 1201   CL 108 06/22/2015 0848   CL 105 03/08/2013 0851   CL 99 03/03/2012 1201   CO2 27 03/10/2016 1446   CO2 26 06/22/2015 0848   CO2 28 03/03/2012 1201   BUN 26.7* 03/10/2016 1446   BUN 23 06/22/2015 0848   BUN 19 03/03/2012 1201   CREATININE 1.1 03/10/2016 1446   CREATININE 0.89 06/22/2015 0848   CREATININE 0.8 03/03/2012 1201      Component Value Date/Time   CALCIUM 8.8 03/10/2016 1446   CALCIUM 9.3 06/22/2015 0848   CALCIUM 9.1 03/03/2012 1201  ALKPHOS 52 03/10/2016 1446   ALKPHOS 52 06/22/2015 0848   ALKPHOS 71 03/03/2012 1201   AST 21 03/10/2016 1446   AST 21 06/22/2015 0848   AST 50* 03/03/2012 1201   ALT 18 03/10/2016 1446   ALT 18 06/22/2015 0848   ALT 64* 03/03/2012 1201   BILITOT 0.56 03/10/2016 1446   BILITOT 0.9 06/22/2015 0848   BILITOT 0.60 03/03/2012 1201      ASSESSMENT & PLAN:  History of B-cell lymphoma Clinically, she has no signs of recurrence. I will see her a year from now with history, physical examination and blood work.     Orders Placed This Encounter  Procedures  . CBC with Differential/Platelet    Standing Status: Future     Number of Occurrences:      Standing Expiration Date: 04/14/2017  . Comprehensive metabolic panel    Standing Status: Future     Number of Occurrences:      Standing Expiration Date: 04/14/2017   All questions were answered. The patient knows to call the clinic with any problems, questions or concerns. No barriers to learning was detected. I spent 15 minutes counseling the patient face to face. The total time spent in the appointment was 20 minutes and more than 50% was on counseling and  review of test results     [facility], Zanden Colver, MD 03/10/2016 4:21 PM

## 2016-03-10 NOTE — Telephone Encounter (Signed)
per pof to sch appt-cld pt and left a message of time & date of appt 03/10/17

## 2016-03-10 NOTE — Assessment & Plan Note (Signed)
Clinically, she has no signs of recurrence. I will see her a year from now with history, physical examination and blood work.   

## 2016-04-24 ENCOUNTER — Encounter: Payer: Self-pay | Admitting: Internal Medicine

## 2016-04-24 ENCOUNTER — Ambulatory Visit (INDEPENDENT_AMBULATORY_CARE_PROVIDER_SITE_OTHER): Payer: Medicare Other | Admitting: Internal Medicine

## 2016-04-24 VITALS — BP 110/68 | HR 79 | Temp 98.6°F | Wt 132.2 lb

## 2016-04-24 DIAGNOSIS — J019 Acute sinusitis, unspecified: Secondary | ICD-10-CM

## 2016-04-24 DIAGNOSIS — B9689 Other specified bacterial agents as the cause of diseases classified elsewhere: Secondary | ICD-10-CM

## 2016-04-24 MED ORDER — AMOXICILLIN-POT CLAVULANATE 875-125 MG PO TABS: 1.0000 | ORAL_TABLET | Freq: Two times a day (BID) | ORAL | Status: DC

## 2016-04-24 NOTE — Patient Instructions (Signed)

## 2016-04-24 NOTE — Progress Notes (Signed)
Pre visit review using our clinic review tool, if applicable. No additional management support is needed unless otherwise documented below in the visit note. 

## 2016-04-24 NOTE — Progress Notes (Signed)
HPI  Pt presents to the clinic today with c/o nasal congestion, sore throat, cough headache, and frontal pressure. This started 2 weeks ago. In the past few days, shereports increased sore throat, increase fatigue, and productive cough with rhinorrhea with green mucous. The cough is worse at night. She denies fever, chills, body aches or shortness of breath. She has been taking Zyrtec, Flonase, Tylenol, Aleve, and Mucinex without relief. She does have a history of seasonal allergies. She has not had sick contacts that she is aware of.  Review of Systems    Past Medical History  Diagnosis Date  . HLD (hyperlipidemia)   . GERD (gastroesophageal reflux disease)   . Allergic rhinitis   . External hemorrhoid   . Osteopenia   . History of radiation therapy 03/22/12 - 04/09/12    right oropharynx/neck  . LBBB (left bundle branch block) dx'd 05/2014  . Arthritis     "hands; probably qwhere" (03/12/2015)  . Chronic back pain   . 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 Non Hodgkin's lymphoma ([place])   . Basal cell carcinoma ~ 2010    "leg"    Family History  Problem Relation Age of Onset  . Heart attack Father 45  . Heart disease Father   . Stroke Mother 55  . Breast cancer Paternal Aunt   . Cancer Paternal Aunt   . Hyperlipidemia      Family history  . Cancer Paternal Aunt   . Colon cancer Neg Hx   . Colon polyps Neg Hx     Social History   Social History  . Marital Status: Married    Spouse Name: N/A  . Number of Children: 2  . Years of Education: N/A   Occupational History  . Employed     [employer], Glass blower/designer   Social History Main Topics  . Smoking status: Former Smoker -- 0.25 packs/day for 10 years    Types: Cigarettes    Quit date: 11/04/1974  . Smokeless tobacco: Never Used  . Alcohol Use: 0.0 oz/week    0 Standard drinks or equivalent per week     Comment: 03/12/2015 "last drink was awhile ago; might have a couple  drinks/yr at most"  . Drug Use: No  . Sexual Activity: No   Other Topics Concern  . Not on file   Social History Narrative   Married (husband with a lot of health problems and CAD)      2 children      Exercise-going to [employer]      Employed-plans to retire at 89    Allergies  Allergen Reactions  . Pnu-Imune [Pneumococcal Polysaccharide Vaccine] Hives     Constitutional: Positive headache and fatigue. Denies fever.  HEENT:  Positive eye pain, facial pain, nasal congestion, right ear pain, and sore throat. Denies eye redness, ringing in the ears, wax buildup, runny nose or bloody nose. Respiratory: Positive cough. Denies difficulty breathing or shortness of breath.  Cardiovascular: Denies chest pain, chest tightness, palpitations or swelling in the hands or feet.   No other specific complaints in a complete review of systems (except as listed in HPI above).  Objective:  BP 110/68 mmHg  Pulse 79  Temp(Src) 98.6 F (37 C) (Oral)  Wt 132 lb 4 oz (59.988 kg)  SpO2 98%   General: Appears her stated age, well developed, well nourished in NAD. HEENT: Head: normal shape and size, maxillary sinus  tenderness noted; Eyes: sclera white, no icterus, conjunctiva pink; Ears: Tm's gray and intact, normal light reflex; Nose: mucosa boggy and moist, septum midline; Throat/Mouth: + PND. Teeth present, mucosa erythematous and moist, no exudate noted, no lesions or ulcerations noted.  Neck:  Cervical adenopathy noted. Cardiovascular: Normal rate and rhythm. S1,S2 noted.  No murmur, rubs or gallops noted.  Pulmonary/Chest: Normal effort and positive vesicular breath sounds. No respiratory distress. No wheezes, rales or ronchi noted.      Assessment & Plan:   Acute bacterial sinusitis:  Continue Flonase and Zyrtec Use leftover Tessalon Pearls and Hycodan prn for cough eRx for Augmentin BID for 10 days  RTC as needed or if symptoms persist.

## 2016-05-23 ENCOUNTER — Other Ambulatory Visit: Payer: Self-pay | Admitting: Family Medicine

## 2016-06-04 ENCOUNTER — Other Ambulatory Visit: Payer: Self-pay | Admitting: Cardiovascular Disease

## 2016-06-16 ENCOUNTER — Telehealth: Payer: Self-pay

## 2016-06-16 NOTE — Telephone Encounter (Signed)
She would need to make an appt to discuss this

## 2016-06-16 NOTE — Telephone Encounter (Signed)
Pt left v/m; going thru family crisis (pts oldest son has lost his job and no ins and has moved in with pt) and requesting med for nerves; pt does not usually take med for nervousness. last annual 06/29/15; pt scheduled for CPX 07/04/16; [facility] pt request cb.

## 2016-06-17 NOTE — Telephone Encounter (Signed)
Patient notified as instructed by telephone and verbalized understanding. [facility] Appointment scheduled for 06/18/16 with Webb Silversmith NP.

## 2016-06-18 ENCOUNTER — Encounter: Payer: Self-pay | Admitting: Internal Medicine

## 2016-06-18 ENCOUNTER — Ambulatory Visit (INDEPENDENT_AMBULATORY_CARE_PROVIDER_SITE_OTHER): Payer: Medicare Other | Admitting: Internal Medicine

## 2016-06-18 VITALS — BP 114/68 | HR 70 | Temp 98.2°F | Wt 128.0 lb

## 2016-06-18 DIAGNOSIS — F418 Other specified anxiety disorders: Secondary | ICD-10-CM

## 2016-06-18 DIAGNOSIS — Z658 Other specified problems related to psychosocial circumstances: Secondary | ICD-10-CM

## 2016-06-18 DIAGNOSIS — F419 Anxiety disorder, unspecified: Secondary | ICD-10-CM

## 2016-06-18 DIAGNOSIS — F439 Reaction to severe stress, unspecified: Secondary | ICD-10-CM

## 2016-06-18 DIAGNOSIS — F32A Depression, unspecified: Secondary | ICD-10-CM

## 2016-06-18 DIAGNOSIS — F329 Major depressive disorder, single episode, unspecified: Secondary | ICD-10-CM

## 2016-06-18 MED ORDER — SERTRALINE HCL 50 MG PO TABS: ORAL_TABLET | ORAL | Status: DC
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
## 2016-06-18 NOTE — Progress Notes (Signed)
Pre visit review using our clinic review tool, if applicable. No additional management support is needed unless otherwise documented below in the visit note. 

## 2016-06-18 NOTE — Patient Instructions (Signed)
Stress and Stress Management Stress is a normal reaction to life events. It is what you feel when life demands more than you are used to or more than you can handle. Some stress can be useful. For example, the stress reaction can help you catch the last bus of the day, study for a test, or meet a deadline at work. But stress that occurs too often or for too long can cause problems. It can affect your emotional health and interfere with relationships and normal daily activities. Too much stress can weaken your immune system and increase your risk for physical illness. If you already have a medical problem, stress can make it worse. CAUSES  All sorts of life events may cause stress. An event that causes stress for one person may not be stressful for another person. Major life events commonly cause stress. These may be positive or negative. Examples include losing your job, moving into a new home, getting married, having a baby, or losing a loved one. Less obvious life events may also cause stress, especially if they occur day after day or in combination. Examples include working long hours, driving in traffic, caring for children, being in debt, or being in a difficult relationship. SIGNS AND SYMPTOMS Stress may cause emotional symptoms including, the following:  Anxiety. This is feeling worried, afraid, on edge, overwhelmed, or out of control.  Anger. This is feeling irritated or impatient.  Depression. This is feeling sad, down, helpless, or guilty.  Difficulty focusing, remembering, or making decisions. Stress may cause physical symptoms, including the following:   Aches and pains. These may affect your head, neck, back, stomach, or other areas of your body.  Tight muscles or clenched jaw.  Low energy or trouble sleeping. Stress may cause unhealthy behaviors, including the following:   Eating to feel better (overeating) or skipping meals.  Sleeping too little, too much, or both.  Working  too much or putting off tasks (procrastination).  Smoking, drinking alcohol, or using drugs to feel better. DIAGNOSIS  Stress is diagnosed through an assessment by your health care provider. Your health care provider will ask questions about your symptoms and any stressful life events.Your health care provider will also ask about your medical history and may order blood tests or other tests. Certain medical conditions and medicine can cause physical symptoms similar to stress. Mental illness can cause emotional symptoms and unhealthy behaviors similar to stress. Your health care provider may refer you to a mental health professional for further evaluation.  TREATMENT  Stress management is the recommended treatment for stress.The goals of stress management are reducing stressful life events and coping with stress in healthy ways.  Techniques for reducing stressful life events include the following:  Stress identification. Self-monitor for stress and identify what causes stress for you. These skills may help you to avoid some stressful events.  Time management. Set your priorities, keep a calendar of events, and learn to say "no." These tools can help you avoid making too many commitments. Techniques for coping with stress include the following:  Rethinking the problem. Try to think realistically about stressful events rather than ignoring them or overreacting. Try to find the positives in a stressful situation rather than focusing on the negatives.  Exercise. Physical exercise can release both physical and emotional tension. The key is to find a form of exercise you enjoy and do it regularly.  Relaxation techniques. These relax the body and mind. Examples include yoga, meditation, tai chi, biofeedback, deep  breathing, progressive muscle relaxation, listening to music, being out in nature, journaling, and other hobbies. Again, the key is to find one or more that you enjoy and can do  regularly.  Healthy lifestyle. Eat a balanced diet, get plenty of sleep, and do not smoke. Avoid using alcohol or drugs to relax.  Strong support network. Spend time with family, friends, or other people you enjoy being around.Express your feelings and talk things over with someone you trust. Counseling or talktherapy with a mental health professional may be helpful if you are having difficulty managing stress on your own. Medicine is typically not recommended for the treatment of stress.Talk to your health care provider if you think you need medicine for symptoms of stress. HOME CARE INSTRUCTIONS  Keep all follow-up visits as directed by your health care provider.  Take all medicines as directed by your health care provider. SEEK MEDICAL CARE IF:  Your symptoms get worse or you start having new symptoms.  You feel overwhelmed by your problems and can no longer manage them on your own. SEEK IMMEDIATE MEDICAL CARE IF:  You feel like hurting yourself or someone else.   This information is not intended to replace advice given to you by your health care provider. Make sure you discuss any questions you have with your health care provider.   Document Released: 06/03/2001 Document Revised: 12/29/2014 Document Reviewed: 08/02/2013 Elsevier Interactive Patient Education 2016 [employer]  

## 2016-06-25 ENCOUNTER — Telehealth: Payer: Self-pay | Admitting: Family Medicine

## 2016-06-25 DIAGNOSIS — R739 Hyperglycemia, unspecified: Secondary | ICD-10-CM

## 2016-06-25 DIAGNOSIS — Z0000 Encounter for general adult medical examination without abnormal findings: Secondary | ICD-10-CM

## 2016-06-25 NOTE — Telephone Encounter (Signed)
-----   Message from Marchia Bond sent at 06/25/2016 10:52 AM EDT ----- Regarding: Cpx labs Mon 7/10, need orders. Thanks! :-) Please order  future cpx labs for pt's upcoming lab appt. Thanks Tara Sloan

## 2016-06-27 ENCOUNTER — Ambulatory Visit (INDEPENDENT_AMBULATORY_CARE_PROVIDER_SITE_OTHER): Payer: Medicare Other | Admitting: Cardiovascular Disease

## 2016-06-27 ENCOUNTER — Encounter: Payer: Self-pay | Admitting: Cardiovascular Disease

## 2016-06-27 VITALS — BP 90/64 | HR 80 | Ht 63.0 in | Wt 127.5 lb

## 2016-06-27 DIAGNOSIS — I2510 Atherosclerotic heart disease of native coronary artery without angina pectoris: Secondary | ICD-10-CM

## 2016-06-27 DIAGNOSIS — I2584 Coronary atherosclerosis due to calcified coronary lesion: Secondary | ICD-10-CM | POA: Diagnosis not present

## 2016-06-27 DIAGNOSIS — I447 Left bundle-branch block, unspecified: Secondary | ICD-10-CM | POA: Diagnosis not present

## 2016-06-27 DIAGNOSIS — I700 Atherosclerosis of aorta: Secondary | ICD-10-CM | POA: Diagnosis not present

## 2016-06-27 DIAGNOSIS — E785 Hyperlipidemia, unspecified: Secondary | ICD-10-CM | POA: Diagnosis not present

## 2016-06-27 NOTE — Progress Notes (Signed)
Patient ID: JANENE QUARTUCCIO, female   DOB: [birth year], 70 y.o.   MRN: [id number] Cardiology Office Note  Date:  06/27/2016   ID:  SUKARI DOERFLINGER, DOB 1946/07/07, MRN [id number]  PCP:  Loura Pardon, MD   Chief Complaint  Patient presents with  . other    1 yr f/u no complaints. Meds reviewed verbally with pt.    HPI:   Ms. Hyler is a very pleasant 70 year old woman with strong family history of coronary artery disease, patient of Dr. Glori Bickers, with PVD and coronary artery disease on CT scan. She does report remote smoking history for less than 10 years She presents today for follow-up of her CAD, PAD, hyperlipidemia History of large cell non-Hodgkin's lymphoma, chemo and xrt She has left bundle branch block that comes and goes on EKG  In follow-up today, she reports that she feels well, no complaints. Blood pressure running low but she denies any orthostasis symptoms Denies any chest pain symptoms concerning for angina Tolerating high-dose Lipitor without any side effects of myalgias  EKG shows normal sinus rhythm with rate 80 bpm, no significant ST or T-wave changes  Other past medical history History of large cell non-Hodgkin's lymphoma. She was treated in 2013 with chemotherapy and radiation treatment. Followup studies including full-body CT scan and PET scan did not show recurrent disease or metastases. CT scan did show coronary artery disease in the LAD, OM, RCA and aorta.  She does report having prior stress test in 2009 which showed no ischemia.  Review of her CT scans from September 2013 in March 2014 shows atherosclerosis/PVD in the aortic arch, descending thoracic aorta. Also with mild CAD any proximal LAD, left circumflex and RCA. Difficult to determine extent given motion artifact. Left main was not well visualized in both CT scans given motion artifact. Otherwise normal size cardiac chambers.  prior EKG EKG shows normal sinus rhythm with rate 94 beats per minute  with no significant ST or T wave changes. EKG today shows normal sinus rhythm with rate 74 beats per minute, left bundle branch block (new)  Father had first MI in his mid 71s. 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  PSH:    Past Surgical History  Procedure Laterality Date  . Dilation and curettage of uterus  1987  . Tubal ligation  1987  . Colonoscopy  5/06    Ext. hemms  . Tonsillectomy  11/07/2011    Procedure: TONSILLECTOMY;  Surgeon: Rozetta Nunnery, MD;  Location: [facility];  Service: ENT;  Laterality: N/A;  . Tonsillectomy  10/2011    "right was cancerous"  . Breast biopsy Left 12/03    Negative  . Abdominal hysterectomy  1988    "partial; for fibroids"  . Basal cell carcinoma excision Right ~ 2010    "leg"    Current Outpatient Prescriptions  Medication Sig Dispense Refill  . acetaminophen (TYLENOL) 500 MG tablet Take 1,000 mg by mouth every 8 (eight) hours as needed for mild pain or headache.    . alendronate (FOSAMAX) 70 MG tablet TAKE 1 TABLET BY MOUTH EVERY 7 DAYS ON AN EMPTY STOMACH WITH A FULL GLASS OF WATER  12 tablet 0  . aspirin 81 MG tablet Take 81 mg by mouth every evening.     Marland Kitchen atorvastatin (LIPITOR) 80 MG tablet TAKE 1 TABLET (80 MG TOTAL) BY MOUTH DAILY. 30 tablet 0  . Calcium-Vitamin D-Vitamin K 500-100-40 MG-UNT-MCG CHEW Chew 2 tablets by mouth daily.     . Cholecalciferol (VITAMIN D-3) 1000 UNITS CAPS Take 2,000 Units by mouth daily.     . GUAIFENESIN AC 100-10 MG/5ML syrup take 5 milliliters by mouth four times a day if needed for cough  0  . Multiple Vitamin  (MULTIVITAMIN) tablet Take 1 tablet by mouth daily.      . naproxen sodium (ANAPROX) 220 MG tablet Take 220 mg by mouth 2 (two) times daily as needed (for pain). PAIN     . omeprazole (PRILOSEC) 20 MG capsule TAKE 1 CAPSULE BY MOUTH DAILY AS NEEDED FOR HEARTBURN 90 capsule 3  . sertraline (ZOLOFT) 50 MG tablet Take 1/2 tablet QHS x 2 weeks, then increase to 1 tab QHS 30 tablet 2  . [DISCONTINUED] prochlorperazine (COMPAZINE) 10 MG tablet Take 1 tablet (10 mg total) by mouth every 6 (six) hours as needed (Nausea or vomiting). 30 tablet 6   No current facility-administered medications for this visit.     Allergies:   Pnu-imune   Social History:  The patient  reports that she quit smoking about 41 years ago. Her smoking use included Cigarettes. She has a 2.5 pack-year smoking history. She has never used smokeless tobacco. She reports that she drinks alcohol. She reports that she does not use illicit drugs.   Family History:   family history includes Breast cancer in her paternal aunt; Cancer in her paternal aunt and paternal aunt; Heart attack (age of onset: 38) in her father; Heart disease in her father; Stroke (age of onset: 49) in her mother. There is no history of Colon cancer or Colon polyps.    Review of Systems: Review of Systems  Constitutional: Negative.   Respiratory: Negative.   Cardiovascular: Negative.   Gastrointestinal: Negative.   Musculoskeletal: Negative.   Neurological: Negative.   Psychiatric/Behavioral: Negative.   All other systems reviewed and are negative.    PHYSICAL EXAM: VS:  BP 90/64 mmHg  Pulse 80  Ht 5\' 3"  (1.6 m)  Wt 127 lb 8 oz (57.834 kg)  BMI 22.59 kg/m2 , BMI Body mass index is 22.59 kg/(m^2). GEN: Well nourished, well developed, in no acute distress HEENT: normal Neck: no JVD, trace carotid bruit on the left, no  masses Cardiac: RRR; 1/6 murmur right sternal border,no rubs, or gallops,no edema  Respiratory:  clear to auscultation bilaterally,  normal work of breathing GI: soft, nontender, nondistended, + BS MS: no deformity or atrophy Skin: warm and dry, no rash Neuro:  Strength and sensation are intact Psych: euthymic mood, full affect    Recent Labs: 03/10/2016: ALT 18; BUN 26.7*; Creatinine 1.1; HGB 12.8; Platelets 166; Potassium 4.2; Sodium 142    Lipid Panel Lab Results  Component Value Date   CHOL 131 06/22/2015   HDL 36.60* 06/22/2015   LDLCALC 74 06/22/2015   TRIG 101.0 06/22/2015      Wt Readings from Last 3 Encounters:  06/27/16 127 lb 8 oz (57.834 kg)  06/18/16 128 lb (58.06 kg)  04/24/16 132 lb 4 oz (59.988 kg)       ASSESSMENT AND PLAN:  Left bundle branch block (LBBB) - Plan: EKG 12-Lead Left bundle branch block seems to come and go,  asymptomatic Previous stress test several years ago with no ischemia Mild coronary disease on CT scan Being treated aggressively for coronary disease No further testing at this time as she is asymptomatic  Coronary artery calcinosis - Plan: EKG 12-Lead Currently with no symptoms of angina. No further workup at this time. Continue current medication regimen.  Aortic atherosclerosis (HCC) Cholesterol at goal, nonsmoker no diabetes  Dyslipidemia Recommended she continue on her Lipitor 80 mg daily   Total encounter time more than 15 minutes  Greater than 50% was spent in counseling and coordination of care with the patient    Disposition:   F/U  12 months prn   Orders Placed This Encounter  Procedures  . EKG 12-Lead     Signed, Esmond Plants, M.D., Ph.D. 06/27/2016  [place], [place]

## 2016-06-27 NOTE — Patient Instructions (Signed)
Medication Instructions:   No med changes Hydrate  Labwork:  No labs today  Testing/Procedures:  No testing needed  Follow-Up: It was a pleasure seeing you in the office today. Please call us if you have new issues that need to be addressed before your next appt.  [phone]  Your physician wants you to follow-up in: 12 months.  You will receive a reminder letter in the mail two months in advance. If you don't receive a letter, please call our office to schedule the follow-up appointment.  If you need a refill on your cardiac medications before your next appointment, please call your pharmacy.

## 2016-06-30 ENCOUNTER — Other Ambulatory Visit (INDEPENDENT_AMBULATORY_CARE_PROVIDER_SITE_OTHER): Payer: Medicare Other

## 2016-06-30 DIAGNOSIS — Z0000 Encounter for general adult medical examination without abnormal findings: Secondary | ICD-10-CM | POA: Diagnosis not present

## 2016-06-30 DIAGNOSIS — R739 Hyperglycemia, unspecified: Secondary | ICD-10-CM | POA: Diagnosis not present

## 2016-06-30 LAB — LIPID PANEL
Cholesterol: 186 mg/dL (ref 0–200)
HDL: 36.6 mg/dL — ABNORMAL LOW (ref >39.00)
LDL Cholesterol: 114 mg/dL — ABNORMAL HIGH (ref 0–99)
NonHDL: 149.56
Total CHOL/HDL Ratio: 5
Triglycerides: 176 mg/dL — ABNORMAL HIGH (ref 0.0–149.0)
VLDL: 35.2 mg/dL (ref 0.0–40.0)

## 2016-06-30 LAB — COMPREHENSIVE METABOLIC PANEL
ALT: 15 U/L (ref 0–35)
AST: 19 U/L (ref 0–37)
Albumin: 3.9 g/dL (ref 3.5–5.2)
Alkaline Phosphatase: 51 U/L (ref 39–117)
BUN: 21 mg/dL (ref 6–23)
CO2: 28 mEq/L (ref 19–32)
Calcium: 9.5 mg/dL (ref 8.4–10.5)
Chloride: 107 mEq/L (ref 96–112)
Creatinine, Ser: 0.99 mg/dL (ref 0.40–1.20)
GFR: 58.83 mL/min — ABNORMAL LOW (ref >60.00)
Glucose, Bld: 106 mg/dL — ABNORMAL HIGH (ref 70–99)
Potassium: 4.2 mEq/L (ref 3.5–5.1)
Sodium: 140 mEq/L (ref 135–145)
Total Bilirubin: 0.5 mg/dL (ref 0.2–1.2)
Total Protein: 6.5 g/dL (ref 6.0–8.3)

## 2016-06-30 LAB — CBC WITH DIFFERENTIAL/PLATELET
Basophils Absolute: 0 10*3/uL (ref 0.0–0.1)
Basophils Relative: 0.7 % (ref 0.0–3.0)
Eosinophils Absolute: 0.1 10*3/uL (ref 0.0–0.7)
Eosinophils Relative: 0.9 % (ref 0.0–5.0)
HCT: 42 % (ref 36.0–46.0)
Hemoglobin: 14.1 g/dL (ref 12.0–15.0)
Lymphocytes Relative: 25.3 % (ref 12.0–46.0)
Lymphs Abs: 1.8 10*3/uL (ref 0.7–4.0)
MCHC: 33.5 g/dL (ref 30.0–36.0)
MCV: 89.5 fl (ref 78.0–100.0)
Monocytes Absolute: 0.8 10*3/uL (ref 0.1–1.0)
Monocytes Relative: 10.9 % (ref 3.0–12.0)
Neutro Abs: 4.5 10*3/uL (ref 1.4–7.7)
Neutrophils Relative %: 62.2 % (ref 43.0–77.0)
Platelets: 214 10*3/uL (ref 150.0–400.0)
RBC: 4.7 Mil/uL (ref 3.87–5.11)
RDW: 14.3 % (ref 11.5–15.5)
WBC: 7.2 10*3/uL (ref 4.0–10.5)

## 2016-06-30 LAB — HEMOGLOBIN A1C: Hgb A1c MFr Bld: 6 % (ref 4.6–6.5)

## 2016-06-30 LAB — TSH: TSH: 2.46 u[IU]/mL (ref 0.35–4.50)

## 2016-07-01 ENCOUNTER — Other Ambulatory Visit: Payer: Self-pay | Admitting: *Deleted

## 2016-07-01 MED ORDER — ATORVASTATIN CALCIUM 80 MG PO TABS: ORAL_TABLET | ORAL | Status: DC

## 2016-07-01 NOTE — Telephone Encounter (Signed)
Requested Prescriptions   Signed Prescriptions Disp Refills  . atorvastatin (LIPITOR) 80 MG tablet 30 tablet 3    Sig: TAKE 1 TABLET (80 MG TOTAL) BY MOUTH DAILY.    Authorizing Provider: Minna Merritts    Ordering User: Britt Bottom

## 2016-07-04 ENCOUNTER — Ambulatory Visit (INDEPENDENT_AMBULATORY_CARE_PROVIDER_SITE_OTHER): Payer: Medicare Other | Admitting: Family Medicine

## 2016-07-04 ENCOUNTER — Encounter: Payer: Self-pay | Admitting: Family Medicine

## 2016-07-04 VITALS — BP 106/58 | HR 71 | Temp 98.3°F | Ht 63.25 in | Wt 129.2 lb

## 2016-07-04 DIAGNOSIS — Z0000 Encounter for general adult medical examination without abnormal findings: Secondary | ICD-10-CM

## 2016-07-04 DIAGNOSIS — E2839 Other primary ovarian failure: Secondary | ICD-10-CM | POA: Insufficient documentation

## 2016-07-04 DIAGNOSIS — E785 Hyperlipidemia, unspecified: Secondary | ICD-10-CM | POA: Diagnosis not present

## 2016-07-04 DIAGNOSIS — M8580 Other specified disorders of bone density and structure, unspecified site: Secondary | ICD-10-CM

## 2016-07-04 DIAGNOSIS — R739 Hyperglycemia, unspecified: Secondary | ICD-10-CM

## 2016-07-04 MED ORDER — OMEPRAZOLE 20 MG PO CPDR: DELAYED_RELEASE_CAPSULE | ORAL | Status: DC

## 2016-07-04 MED ORDER — SERTRALINE HCL 50 MG PO TABS: ORAL_TABLET | ORAL | Status: DC

## 2016-07-04 MED ORDER — ALENDRONATE SODIUM 70 MG PO TABS: ORAL_TABLET | ORAL | Status: DC

## 2016-07-04 NOTE — Patient Instructions (Addendum)
Don't forget to schedule your annual eye exam  Please do IFOB stool test for colon cancer screening  Labs today  Try to find some exercise that does not hurt your knees- like a bike or water exercise  Work on weight loss-that will help diabetes Keep thinking about quitting smoking

## 2016-07-04 NOTE — Progress Notes (Signed)
Subjective:    Patient ID: Tara Sloan, female    DOB: [birth year], 70 y.o.   MRN: [id number]  HPI Here for health maintenance exam and to review chronic medical problems    Feeling good "for an old lady"  Wt Readings from Last 3 Encounters:  07/04/16 129 lb 4 oz (58.627 kg)  06/27/16 127 lb 8 oz (57.834 kg)  06/18/16 128 lb (58.06 kg)     dexa- worsened osteopenia 7/15 (due for f/u) - [place] On fosamax since 2015 Also takes ppi [place] -none  fx hx -none  Ca and D [facility]   Mammogram 9/16-negative Self breast exam -no lumps   Colonoscopy 9/16- diverticulosis 10 year recall if she wanted dep on age/health  Flu shot 11/16  Td 3/14  Hep C screen 12/12 neg  Zoster vaccine 2013 PNA completed   Hx of hyperglycemia Lab Results  Component Value Date   HGBA1C 6.0 06/30/2016   This is up from 5.8 Eating more fruit and corn in the summer   Hx of hyperlipidemia Lab Results  Component Value Date   CHOL 186 06/30/2016   CHOL 131 06/22/2015   CHOL 171 06/19/2014   Lab Results  Component Value Date   HDL 36.60* 06/30/2016   HDL 36.60* 06/22/2015   HDL 44.20 06/19/2014   Lab Results  Component Value Date   LDLCALC 114* 06/30/2016   LDLCALC 74 06/22/2015   LDLCALC 89 06/19/2014   Lab Results  Component Value Date   TRIG 176.0* 06/30/2016   TRIG 101.0 06/22/2015   TRIG 187.0* 06/19/2014   Lab Results  Component Value Date   CHOLHDL 5 06/30/2016   CHOLHDL 4 06/22/2015   CHOLHDL 4 06/19/2014   Lab Results  Component Value Date   LDLDIRECT 92.2 03/02/2012   on atorvastatin 80 and diet  Some butter - using the real thing  Does eat some cheese  Patient Active Problem List   Diagnosis Date Noted  . Estrogen deficiency 07/04/2016  . Aortic atherosclerosis ([place]) 07/06/2015  . Colon cancer screening 06/29/2015  . Routine general medical examination at a health care facility 06/21/2015  . Chest pain 03/11/2015    . Left bundle branch block (LBBB) 07/06/2014  . Encounter for Medicare annual wellness exam 03/17/2013  . 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 Pain in joint, ankle and foot 03/16/2013  . Hyperglycemia 12/01/2011  . History of B-cell lymphoma 11/22/2011  . ARTHRITIS, HANDS, BILATERAL 12/12/2010  . Dyslipidemia 04/02/2008  . ALLERGIC RHINITIS 04/02/2008  . GERD 04/02/2008  . Osteopenia 09/13/2007  . ALLERGY 08/06/2007   Past Medical History  Diagnosis Date  . HLD (hyperlipidemia)   . GERD (gastroesophageal reflux disease)   . Allergic rhinitis   . External hemorrhoid   . Osteopenia   . History of radiation therapy 03/22/12 - 04/09/12    right oropharynx/neck  . LBBB (left bundle branch block) dx'd 05/2014  . Arthritis     "hands; probably qwhere" (03/12/2015)  . Chronic back pain   . Diffuse large B cell lymphoma ([place]) 11/07/11    dx from tonsilar biopsy  . Non Hodgkin's lymphoma ([place])   . Basal cell carcinoma ~ 2010    "leg"   Past Surgical History  Procedure Laterality Date  . Dilation and curettage of uterus  1987  . Tubal ligation  1987  . Colonoscopy  5/06    Ext. hemms  . Tonsillectomy  11/07/2011  Procedure: TONSILLECTOMY;  Surgeon: Rozetta Nunnery, MD;  Location: [facility];  Service: ENT;  Laterality: N/A;  . Tonsillectomy  10/2011    "right was cancerous"  . Breast biopsy Left 12/03    Negative  . Abdominal hysterectomy  1988    "partial; for fibroids"  . Basal cell carcinoma excision Right ~ 2010    "leg"   Social History  Substance Use Topics  . Smoking status: Former Smoker -- 0.25 packs/day for 10 years    Types: Cigarettes    Quit date: 11/04/1974  . Smokeless tobacco: Never Used  . Alcohol Use: 0.0 oz/week    0 Standard drinks or equivalent per week     Comment: rare   Family History  Problem Relation Age of Onset  . Heart attack Father 60  . Heart disease Father   . Stroke Mother 83  . Breast cancer Paternal Aunt    . Cancer Paternal Aunt   . Hyperlipidemia      Family history  . Cancer Paternal Aunt   . Colon cancer Neg Hx   . Colon polyps Neg Hx    Allergies  Allergen Reactions  . Pnu-Imune [Pneumococcal Polysaccharide Vaccine] Hives   Current Outpatient Prescriptions on File Prior to Visit  Medication Sig Dispense Refill  . acetaminophen (TYLENOL) 500 MG tablet Take 1,000 mg by mouth every 8 (eight) hours as needed for mild pain or headache.    Marland Kitchen aspirin 81 MG tablet Take 81 mg by mouth every evening.     Marland Kitchen atorvastatin (LIPITOR) 80 MG tablet TAKE 1 TABLET (80 MG TOTAL) BY MOUTH DAILY. 30 tablet 3  . Calcium-Vitamin D-Vitamin K 500-100-40 MG-UNT-MCG CHEW Chew 2 tablets by mouth daily.     . Cholecalciferol (VITAMIN D-3) 1000 UNITS CAPS Take 2,000 Units by mouth daily.     . Multiple Vitamin (MULTIVITAMIN) tablet Take 1 tablet by mouth daily.      . naproxen sodium (ANAPROX) 220 MG tablet Take 220 mg by mouth 2 (two) times daily as needed (for pain). PAIN     . [DISCONTINUED] prochlorperazine (COMPAZINE) 10 MG tablet Take 1 tablet (10 mg total) by mouth every 6 (six) hours as needed (Nausea or vomiting). 30 tablet 6   No current facility-administered medications on file prior to visit.     Review of Systems Review of Systems  Constitutional: Negative for fever, appetite change, fatigue and unexpected weight change.  Eyes: Negative for pain and visual disturbance.  Respiratory: Negative for cough and shortness of breath.   Cardiovascular: Negative for cp or palpitations    Gastrointestinal: Negative for nausea, diarrhea and constipation.  Genitourinary: Negative for urgency and frequency.  Skin: Negative for pallor or rash   Neurological: Negative for weakness, light-headedness, numbness and headaches.  Hematological: Negative for adenopathy. Does not bruise/bleed easily.  Psychiatric/Behavioral: Negative for dysphoric mood. The patient is not nervous/anxious.         Objective:    Physical Exam  Constitutional: She appears well-developed and well-nourished. No distress.  Well appearing   HENT:  Head: Normocephalic and atraumatic.  Right Ear: External ear normal.  Left Ear: External ear normal.  Mouth/Throat: Oropharynx is clear and moist.  Eyes: Conjunctivae and EOM are normal. Pupils are equal, round, and reactive to light. No scleral icterus.  Neck: Normal range of motion. Neck supple. No JVD present. Carotid bruit is not present. No thyromegaly present.  Cardiovascular: Normal rate, regular rhythm, normal heart sounds and intact  distal pulses.  Exam reveals no gallop.   Pulmonary/Chest: Effort normal and breath sounds normal. No respiratory distress. She has no wheezes. She exhibits no tenderness.  Abdominal: Soft. Bowel sounds are normal. She exhibits no distension, no abdominal bruit and no mass. There is no tenderness.  Genitourinary: No breast swelling, tenderness, discharge or bleeding.  Breast exam: No mass, nodules, thickening, tenderness, bulging, retraction, inflamation, nipple discharge or skin changes noted.  No axillary or clavicular LA.       Musculoskeletal: Normal range of motion. She exhibits no edema or tenderness.  Lymphadenopathy:    She has no cervical adenopathy.  Neurological: She is alert. She has normal reflexes. No cranial nerve deficit. She exhibits normal muscle tone. Coordination normal.  Skin: Skin is warm and dry. No rash noted. No erythema. No pallor.  Psychiatric: She has a normal mood and affect.          Assessment & Plan:   Problem List Items Addressed This Visit      Musculoskeletal and Integument   Osteopenia    Due for dexa On fosamax  No falls or fractures Disc need for calcium/ vitamin D/ wt bearing exercise and bone density test every 2 y to monitor Disc safety/ fracture risk in detail           Other   Routine general medical examination at a health care facility - Primary    Reviewed health habits  including diet and exercise and skin cancer prevention Reviewed appropriate screening tests for age  Also reviewed health mt list, fam hx and immunization status , as well as social and family history   See HPI Labs reviewed Will schedule appt for AMW Don't forget to schedule your annual eye exam  Please do IFOB stool test for colon cancer screening  Labs today  Try to find some exercise that does not hurt your knees- like a bike or water exercise  Work on weight loss-that will help diabetes Keep thinking about quitting smoking      Hyperglycemia    Lab Results  Component Value Date   HGBA1C 6.0 06/30/2016   This is up slt  Disc imp of low glycemic diet and wt control to prevent DM      Estrogen deficiency   Relevant Orders   DG Bone Density   Dyslipidemia    LDL is up a bit Diet and statin Disc goals for lipids and reasons to control them Rev labs with pt Rev low sat fat diet in detail  Will cut back on butter/high fat dairy to help with LDL

## 2016-07-04 NOTE — Progress Notes (Signed)
   Subjective:    Patient ID: Tara Sloan, female    DOB: 24-Dec-1945, 70 y.o.   MRN: [id number]  HPI    Review of Systems     Objective:   Physical Exam        Assessment & Plan:

## 2016-07-04 NOTE — Progress Notes (Signed)
Pre visit review using our clinic review tool, if applicable. No additional management support is needed unless otherwise documented below in the visit note. 

## 2016-07-06 NOTE — Assessment & Plan Note (Signed)
Due for dexa On fosamax  No falls or fractures Disc need for calcium/ vitamin D/ wt bearing exercise and bone density test every 2 y to monitor Disc safety/ fracture risk in detail

## 2016-07-06 NOTE — Assessment & Plan Note (Signed)
LDL is up a bit Diet and statin Disc goals for lipids and reasons to control them Rev labs with pt Rev low sat fat diet in detail  Will cut back on butter/high fat dairy to help with LDL

## 2016-07-06 NOTE — Assessment & Plan Note (Signed)
Reviewed health habits including diet and exercise and skin cancer prevention Reviewed appropriate screening tests for age  Also reviewed health mt list, fam hx and immunization status , as well as social and family history   See HPI Labs reviewed Will schedule appt for AMW Don't forget to schedule your annual eye exam  Please do IFOB stool test for colon cancer screening  Labs today  Try to find some exercise that does not hurt your knees- like a bike or water exercise  Work on weight loss-that will help diabetes Keep thinking about quitting smoking

## 2016-07-06 NOTE — Assessment & Plan Note (Signed)
Lab Results  Component Value Date   HGBA1C 6.0 06/30/2016   This is up slt  Disc imp of low glycemic diet and wt control to prevent DM

## 2016-07-15 ENCOUNTER — Ambulatory Visit (INDEPENDENT_AMBULATORY_CARE_PROVIDER_SITE_OTHER): Payer: Medicare Other

## 2016-07-15 VITALS — BP 104/62 | HR 76 | Temp 98.1°F | Ht 63.0 in | Wt 131.2 lb

## 2016-07-15 DIAGNOSIS — Z0000 Encounter for general adult medical examination without abnormal findings: Secondary | ICD-10-CM

## 2016-07-15 NOTE — Patient Instructions (Signed)
Tara Sloan , Thank you for taking time to come for your Medicare Wellness Visit. I appreciate your ongoing commitment to your health goals. Please review the following plan we discussed and let me know if I can assist you in the future.   These are the goals we discussed: Goals    . Increase physical activity          Starting 07/15/2016, I will continue to exercise for at least 60 min 3 days per week.        This is a list of the screening recommended for you and due dates:  Health Maintenance  Topic Date Due  . Flu Shot  07/22/2016  . Mammogram  09/16/2016  . Tetanus Vaccine  03/17/2023  . Colon Cancer Screening  09/11/2025  . DEXA scan (bone density measurement)  Completed  . Shingles Vaccine  Completed  .  Hepatitis C: One time screening is recommended by Center for Disease Control  (CDC) for  adults born from 2 through 1965.   Completed  . Pneumonia vaccines  Completed   Preventive Care for Adults  A healthy lifestyle and preventive care can promote health and wellness. Preventive health guidelines for adults include the following key practices.  . A routine yearly physical is a good way to check with your health care provider about your health and preventive screening. It is a chance to share any concerns and updates on your health and to receive a thorough exam.  . Visit your dentist for a routine exam and preventive care every 6 months. Brush your teeth twice a day and floss once a day. Good oral hygiene prevents tooth decay and gum disease.  . The frequency of eye exams is based on your age, health, family medical history, use  of contact lenses, and other factors. Follow your health care provider's ecommendations for frequency of eye exams.  . Eat a healthy diet. Foods like vegetables, fruits, whole grains, low-fat dairy products, and lean protein foods contain the nutrients you need without too many calories. Decrease your intake of foods high in solid fats, added  sugars, and salt. Eat the right amount of calories for you. Get information about a proper diet from your health care provider, if necessary.  . Regular physical exercise is one of the most important things you can do for your health. Most adults should get at least 150 minutes of moderate-intensity exercise (any activity that increases your heart rate and causes you to sweat) each week. In addition, most adults need muscle-strengthening exercises on 2 or more days a week.  Silver Sneakers may be a benefit available to you. To determine eligibility, you may visit the website: www.silversneakers.com or contact program at 925-365-7455 Mon-Fri between 8AM-8PM.   . Maintain a healthy weight. The body mass index (BMI) is a screening tool to identify possible weight problems. It provides an estimate of body fat based on height and weight. Your health care provider can find your BMI and can help you achieve or maintain a healthy weight.   For adults 20 years and older: ? A BMI below 18.5 is considered underweight. ? A BMI of 18.5 to 24.9 is normal. ? A BMI of 25 to 29.9 is considered overweight. ? A BMI of 30 and above is considered obese.   . Maintain normal blood lipids and cholesterol levels by exercising and minimizing your intake of saturated fat. Eat a balanced diet with plenty of fruit and vegetables. Blood tests for lipids  and cholesterol should begin at age 38 and be repeated every 5 years. If your lipid or cholesterol levels are high, you are over 50, or you are at high risk for heart disease, you may need your cholesterol levels checked more frequently. Ongoing high lipid and cholesterol levels should be treated with medicines if diet and exercise are not working.  . If you smoke, find out from your health care provider how to quit. If you do not use tobacco, please do not start.  . If you choose to drink alcohol, please do not consume more than 2 drinks per day. One drink is considered to  be 12 ounces (355 mL) of beer, 5 ounces (148 mL) of wine, or 1.5 ounces (44 mL) of liquor.  . If you are 45-61 years old, ask your health care provider if you should take aspirin to prevent strokes.  . Use sunscreen. Apply sunscreen liberally and repeatedly throughout the day. You should seek shade when your shadow is shorter than you. Protect yourself by wearing long sleeves, pants, a wide-brimmed hat, and sunglasses year round, whenever you are outdoors.  . Once a month, do a whole body skin exam, using a mirror to look at the skin on your back. Tell your health care provider of new moles, moles that have irregular borders, moles that are larger than a pencil eraser, or moles that have changed in shape or color.

## 2016-07-15 NOTE — Progress Notes (Signed)
Pre visit review using our clinic review tool, if applicable. No additional management support is needed unless otherwise documented below in the visit note. 

## 2016-07-15 NOTE — Progress Notes (Signed)
Subjective:   Tara Sloan is a 70 y.o. female who presents for Medicare Annual (Subsequent) preventive examination.  Review of Systems:  N/A Cardiac Risk Factors include: advanced age (>55men, >79 women);dyslipidemia     Objective:     Vitals: BP 104/62 (BP Location: Left Arm, Patient Position: Sitting, Cuff Size: Normal)   Pulse 76   Temp 98.1 F (36.7 C) (Oral)   Ht 5\' 3"  (1.6 m)   Wt 131 lb 4 oz (59.5 kg)   SpO2 97%   BMI 23.25 kg/m   Body mass index is 23.25 kg/m.   Tobacco History  Smoking Status  . Former Smoker  . Packs/day: 0.25  . Years: 10.00  . Types: Cigarettes  . Quit date: 11/04/1974  Smokeless Tobacco  . Never Used     Counseling given: Not Answered   Past Medical History:  Diagnosis Date  . Allergic rhinitis   . Arthritis    "hands; probably qwhere" (03/12/2015)  . Basal cell carcinoma ~ 2010   "leg"  . Chronic back pain   . 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 External hemorrhoid   . GERD (gastroesophageal reflux disease)   . History of radiation therapy 03/22/12 - 04/09/12   right oropharynx/neck  . HLD (hyperlipidemia)   . LBBB (left bundle branch block) dx'd 05/2014  . Non Hodgkin's lymphoma ([place])   . Osteopenia    Past Surgical History:  Procedure Laterality Date  . ABDOMINAL HYSTERECTOMY  1988   "partial; for fibroids"  . BASAL CELL CARCINOMA EXCISION Right ~ 2010   "leg"  . BREAST BIOPSY Left 12/03   Negative  . COLONOSCOPY  5/06   Ext. hemms  . DILATION AND CURETTAGE OF UTERUS  1987  . TONSILLECTOMY  11/07/2011   Procedure: TONSILLECTOMY;  Surgeon: Rozetta Nunnery, MD;  Location: [facility];  Service: ENT;  Laterality: N/A;  . TONSILLECTOMY  10/2011   "right was cancerous"  . TUBAL LIGATION  1987   Family History  Problem Relation Age of Onset  . Heart attack Father 72  . Heart disease Father   . Stroke Mother 81  . Breast cancer Paternal Aunt   . Cancer  Paternal Aunt   . Cancer Paternal Aunt   . Hyperlipidemia      Family history  . Colon cancer Neg Hx   . Colon polyps Neg Hx    History  Sexual Activity  . Sexual activity: No    Outpatient Encounter Prescriptions as of 07/15/2016  Medication Sig  . acetaminophen (TYLENOL) 500 MG tablet Take 1,000 mg by mouth every 8 (eight) hours as needed for mild pain or headache.  . alendronate (FOSAMAX) 70 MG tablet TAKE 1 TABLET BY MOUTH EVERY 7 DAYS ON AN EMPTY STOMACH WITH A FULL GLASS OF WATER  . aspirin 81 MG tablet Take 81 mg by mouth every evening.   Marland Kitchen atorvastatin (LIPITOR) 80 MG tablet TAKE 1 TABLET (80 MG TOTAL) BY MOUTH DAILY.  . Calcium-Vitamin D-Vitamin K 500-100-40 MG-UNT-MCG CHEW Chew 2 tablets by mouth daily.   . Cholecalciferol (VITAMIN D-3) 1000 UNITS CAPS Take 2,000 Units by mouth daily.   . Multiple Vitamin (MULTIVITAMIN) tablet Take 1 tablet by mouth daily.    . naproxen sodium (ANAPROX) 220 MG tablet Take 220 mg by mouth 2 (two) times daily as needed (for pain). PAIN   . omeprazole (PRILOSEC) 20 MG capsule TAKE 1 CAPSULE  BY MOUTH DAILY AS NEEDED FOR HEARTBURN  . sertraline (ZOLOFT) 50 MG tablet Take 1 tab by mouth once daily   No facility-administered encounter medications on file as of 07/15/2016.     Activities of Daily Living In your present state of health, do you have any difficulty performing the following activities: 07/15/2016  Hearing? N  Vision? N  Difficulty concentrating or making decisions? N  Walking or climbing stairs? N  Doing errands, shopping? N  Preparing Food and eating ? N  Using the Toilet? N  In the past six months, have you accidently leaked urine? N  Do you have problems with loss of bowel control? N  Managing your Medications? N  Managing your Finances? N  Housekeeping or managing your Housekeeping? N  Some recent data might be hidden    Patient Care Team: Abner Greenspan, MD as PCP - General Minna Merritts, MD as Consulting Physician  (Cardiology) Lyla Glassing, MD as Referring Physician (Ophthalmology)    Assessment:     Hearing Screening   125Hz  250Hz  500Hz  1000Hz  2000Hz  3000Hz  4000Hz  6000Hz  8000Hz   Right ear:   40 0 40  40    Left ear:   40 0 40  40    Vision Screening Comments: Last eye exam in 07/2015 @ [facility]   Exercise Activities and Dietary recommendations Current Exercise Habits: Home exercise routine, Type of exercise: Other - see comments (Silver Sneakers ), Time (Minutes): 60, Frequency (Times/Week): 3, Weekly Exercise (Minutes/Week): 180, Intensity: Moderate, Exercise limited by: None identified  Goals    . Increase physical activity          Starting 07/15/2016, I will continue to exercise for at least 60 min 3 days per week.       Fall Risk Fall Risk  07/15/2016 06/29/2015 06/27/2014 03/16/2013  Falls in the past year? No No No No   Depression Screen PHQ 2/9 Scores 07/15/2016 06/18/2016 06/29/2015 06/27/2014  PHQ - 2 Score 0 6 0 0  PHQ- 9 Score - 18 - -     Cognitive Testing MMSE - Mini Mental State Exam 07/15/2016  Orientation to time 5  Orientation to Place 5  Registration 3  Attention/ Calculation 0  Recall 3  Language- name 2 objects 0  Language- repeat 1  Language- follow 3 step command 3  Language- read & follow direction 0  Write a sentence 0  Copy design 0  Total score 20   PLEASE NOTE: A Mini-Cog screen was completed. Maximum score is 20. A value of 0 denotes this part of [place]stein MMSE was not completed or the patient failed this part of the Mini-Cog screening.   Mini-Cog Screening Orientation to Time - Max 5 pts Orientation to Place - Max 5 pts Registration - Max 3 pts Recall - Max 3 pts Language Repeat - Max 1 pts Language Follow 3 Step Command - Max 3 pts  Immunization History  Administered Date(s) Administered  . Hepatitis B 04/08/2005, 05/06/2005, 10/08/2005  . Influenza Split 12/03/2011, 09/15/2012  . Influenza Whole 10/31/2002, 10/10/2008, 10/12/2009  .  Influenza,inj,Quad PF,36+ Mos 10/10/2014, 10/30/2015  . Influenza-Unspecified 09/18/2013  . Pneumococcal Conjugate-13 06/27/2014  . Pneumococcal Polysaccharide-23 12/12/2010  . Td 11/14/2002, 03/16/2013  . Zoster 10/14/2012   Screening Tests Health Maintenance  Topic Date Due  . INFLUENZA VACCINE  07/22/2016  . MAMMOGRAM  09/16/2016  . TETANUS/TDAP  03/17/2023  . COLONOSCOPY  09/11/2025  . DEXA SCAN  Completed  .  ZOSTAVAX  Completed  . Hepatitis C Screening  Completed  . PNA vac Low Risk Adult  Completed      Plan:     I have personally reviewed and addressed the Medicare Annual Wellness questionnaire and have noted the following in the patient's chart:  A. Medical and social history B. Use of alcohol, tobacco or illicit drugs  C. Current medications and supplements D. Functional ability and status E.  Nutritional status F.  Physical activity G. Advance directives H. List of other physicians I.  Hospitalizations, surgeries, and ER visits in previous 12 months J.  [place] to include hearing, vision, cognitive, depression L. Referrals and appointments - none  In addition, I have reviewed and discussed with patient certain preventive protocols, quality metrics, and best practice recommendations. A written personalized care plan for preventive services as well as general preventive health recommendations were provided to patient.  See attached scanned questionnaire for additional information.   Signed,   Lindell Noe, MHA, BS, LPN Health Advisor

## 2016-07-15 NOTE — Progress Notes (Signed)
PCP notes:   Health maintenance:  No gaps identified or addressed   Abnormal screenings:   Hearing - failed   Patient concerns:  None   Nurse concerns: None   Next PCP appt: N/A - already had CPE  I reviewed health advisor's note, was available for consultation, and agree with documentation and plan. Loura Pardon MD

## 2016-07-22 ENCOUNTER — Ambulatory Visit (INDEPENDENT_AMBULATORY_CARE_PROVIDER_SITE_OTHER)
Admission: RE | Admit: 2016-07-22 | Discharge: 2016-07-22 | Disposition: A | Discharge status: Home / Self Care | Payer: Medicare Other | Source: Ambulatory Visit | Attending: Family Medicine | Admitting: Family Medicine

## 2016-07-22 DIAGNOSIS — E2839 Other primary ovarian failure: Secondary | ICD-10-CM | POA: Diagnosis not present

## 2016-07-22 LAB — HM DEXA SCAN

## 2016-07-23 ENCOUNTER — Encounter: Payer: Self-pay | Admitting: *Deleted

## 2016-08-04 ENCOUNTER — Telehealth: Payer: Self-pay

## 2016-08-04 DIAGNOSIS — F430 Acute stress reaction: Secondary | ICD-10-CM | POA: Insufficient documentation

## 2016-08-04 MED ORDER — BUSPIRONE HCL 15 MG PO TABS: 7.5000 mg | ORAL_TABLET | Freq: Two times a day (BID) | ORAL | 11 refills | Status: DC

## 2016-08-04 NOTE — Telephone Encounter (Signed)
Pt notified of Dr. Marliss Coots comments and instructions and verbalized understanding. Pt will check with the pharmacy to see if it's ready and she will update [place] in 2 weeks or so

## 2016-08-04 NOTE — Telephone Encounter (Signed)
Oddly -Zoloft's medicine class has been approved to make hot flashes better/not worse (go figure-everyone is different) Do continue counseling with pastor -most important Exercise when able as well  I want to change her to a medicine called buspar-I think it will help anxiety more , is taken twice daily and we can titrate if up if needed  If any suicidal thoughts now or in the future please alert me If in any danger or abuse scenerio also let me know   I will sent buspar to pharmacy Stop zoloft Start buspar 1/2 pill (7.5 mg) twice daily Let me know how she is doing in 2 weeks or so- if helpful we can adjust dose   If any intolerable side effects please let me know

## 2016-08-04 NOTE — Telephone Encounter (Signed)
Spoke with pt and she advise me that she is on 50mg  of the zoloft. Pt said it's not helping at all and she can't tell a difference taking the med. Pt said that she has been having really bad hot flashes since starting the med and she ? If that's a side eff of med. Pt said she can go to sleep easy but she is having a hard time staying a sleep she wakes up about 3-4 times a night and sometimes it's hard for her to go back to sleep. Pt said there isn't a big difference in her appetite she is still eating okay unless she gets some bad news then she can go up to 12 hrs and not eat and not have an appetite. Pt said she is waking up in the morning and her hands are very shaky to the point she can't put jewelry on and it feels like her stomach is in knots (? If anxiety), pt said her stress has not improved and it's the exact same. Pt hasn't seen a counselor but she had talked to her pastor about her issues often

## 2016-08-04 NOTE — Telephone Encounter (Signed)
Pt left v/m; pt was seen 06/18/16; zoloft is not helping and pt request different medication. Pt request cb. [facility].

## 2016-08-04 NOTE — Telephone Encounter (Signed)
She is on 50 mg correct. Helping at all or not at all or partially? Side effects? How is sleep and appetite?  What general symptoms are not improving? Is stress improving? Has she seen a counselor?   Thanks - will go from there

## 2016-08-30 ENCOUNTER — Other Ambulatory Visit: Payer: Self-pay | Admitting: Family Medicine

## 2016-09-02 ENCOUNTER — Other Ambulatory Visit: Payer: Self-pay | Admitting: Family Medicine

## 2016-09-02 DIAGNOSIS — Z1231 Encounter for screening mammogram for malignant neoplasm of breast: Secondary | ICD-10-CM

## 2016-09-13 ENCOUNTER — Other Ambulatory Visit: Payer: Self-pay | Admitting: Internal Medicine

## 2016-09-17 ENCOUNTER — Ambulatory Visit
Admission: RE | Admit: 2016-09-17 | Discharge: 2016-09-17 | Disposition: A | Discharge status: Home / Self Care | Payer: Medicare Other | Source: Ambulatory Visit | Attending: Family Medicine | Admitting: Family Medicine

## 2016-09-17 DIAGNOSIS — Z1231 Encounter for screening mammogram for malignant neoplasm of breast: Secondary | ICD-10-CM | POA: Diagnosis not present

## 2016-09-19 ENCOUNTER — Ambulatory Visit: Payer: Medicare Other

## 2016-09-29 ENCOUNTER — Encounter: Payer: Self-pay | Admitting: Internal Medicine

## 2016-09-29 ENCOUNTER — Ambulatory Visit (INDEPENDENT_AMBULATORY_CARE_PROVIDER_SITE_OTHER): Payer: Medicare Other | Admitting: Internal Medicine

## 2016-09-29 VITALS — BP 106/68 | HR 77 | Temp 98.7°F | Wt 130.0 lb

## 2016-09-29 DIAGNOSIS — R1030 Lower abdominal pain, unspecified: Secondary | ICD-10-CM | POA: Diagnosis not present

## 2016-09-29 DIAGNOSIS — N390 Urinary tract infection, site not specified: Secondary | ICD-10-CM

## 2016-09-29 DIAGNOSIS — R109 Unspecified abdominal pain: Secondary | ICD-10-CM

## 2016-09-29 LAB — POC URINALSYSI DIPSTICK (AUTOMATED)
Glucose, UA: NEGATIVE
Ketones, UA: NEGATIVE
Nitrite, UA: POSITIVE
Spec Grav, UA: >=1.03
Urobilinogen, UA: 0.2
pH, UA: 6

## 2016-09-29 MED ORDER — CIPROFLOXACIN HCL 250 MG PO TABS: 250.0000 mg | ORAL_TABLET | Freq: Two times a day (BID) | ORAL | 0 refills | Status: DC

## 2016-09-29 NOTE — Addendum Note (Signed)
Addended by: Lurlean Nanny on: 09/29/2016 02:49 PM   Modules accepted: Orders

## 2016-09-29 NOTE — Progress Notes (Signed)
HPI  Pt presents to the clinic today with c/o lower abdominal pain and left flank pain. This started 4 days ago. She denies urgency, frequency, dysuria or blood in her urine. She denies nausea, fever or chills. She has tried to increase her fluids and drink cranberry juice. She has had UTI's in the past and reports this feels the same.   Review of Systems  Past Medical History:  Diagnosis Date  . Allergic rhinitis   . Arthritis    "hands; probably qwhere" (03/12/2015)  . Basal cell carcinoma ~ 2010   "leg"  . Chronic back pain   . 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 External hemorrhoid   . GERD (gastroesophageal reflux disease)   . History of radiation therapy 03/22/12 - 04/09/12   right oropharynx/neck  . HLD (hyperlipidemia)   . LBBB (left bundle branch block) dx'd 05/2014  . Non Hodgkin's lymphoma ([place])   . Osteopenia     Family History  Problem Relation Age of Onset  . Heart attack Father 35  . Heart disease Father   . Stroke Mother 62  . Breast cancer Paternal Aunt   . Cancer Paternal Aunt   . Cancer Paternal Aunt   . Hyperlipidemia      Family history  . Colon cancer Neg Hx   . Colon polyps Neg Hx     Social History   Social History  . Marital status: Married    Spouse name: N/A  . Number of children: 2  . Years of education: N/A   Occupational History  . Employed     [employer], Glass blower/designer   Social History Main Topics  . Smoking status: Former Smoker    Packs/day: 0.25    Years: 10.00    Types: Cigarettes    Quit date: 11/04/1974  . Smokeless tobacco: Never Used  . Alcohol use 0.0 oz/week     Comment: rare  . Drug use: No  . Sexual activity: No   Other Topics Concern  . Not on file   Social History Narrative   Married (husband with a lot of health problems and CAD)      2 children      Exercise-going to [employer]      Employed-plans to retire at 82    Allergies  Allergen Reactions  .  Pnu-Imune [Pneumococcal Polysaccharide Vaccine] Hives    Constitutional: Denies fever, malaise, fatigue, headache or abrupt weight changes.   GU: Pt reports left flank pain. Denies urgency, frequency, dysuria, burning sensation, blood in urine, odor or discharge. Skin: Denies redness, rashes, lesions or ulcercations.   No other specific complaints in a complete review of systems (except as listed in HPI above).    Objective:   Physical Exam  BP 106/68   Pulse 77   Temp 98.7 F (37.1 C) (Oral)   Wt 130 lb (59 kg)   SpO2 97%   BMI 23.03 kg/m   Wt Readings from Last 3 Encounters:  09/29/16 130 lb (59 kg)  07/15/16 131 lb 4 oz (59.5 kg)  07/04/16 129 lb 4 oz (58.6 kg)    General: Appears her stated age, in NAD. Abdomen: Soft. Normal bowel sounds. No distention or masses noted. Tender to palpation over the bladder area. No CVA tenderness.      Assessment & Plan:   Lower abdominal pain, left side flank pain secondary to UTI:  Urinalysis: 3+ leuks, 1+ blood,  pos nitrites Will send urine culture eRx sent if for Cipro 250 mg BID x 5 days Drink plenty of fluids  RTC as needed or if symptoms persist. Webb Silversmith, NP

## 2016-09-29 NOTE — Patient Instructions (Signed)

## 2016-10-01 LAB — URINE CULTURE

## 2016-10-03 MED ORDER — BUSPIRONE HCL 15 MG PO TABS: 7.5000 mg | ORAL_TABLET | Freq: Two times a day (BID) | ORAL | 3 refills | Status: DC

## 2016-10-17 ENCOUNTER — Encounter: Payer: Self-pay | Admitting: Family Medicine

## 2016-10-17 ENCOUNTER — Ambulatory Visit (INDEPENDENT_AMBULATORY_CARE_PROVIDER_SITE_OTHER): Payer: Medicare Other | Admitting: Family Medicine

## 2016-10-17 VITALS — BP 126/72 | HR 84 | Temp 98.0°F | Wt 134.2 lb

## 2016-10-17 DIAGNOSIS — R300 Dysuria: Secondary | ICD-10-CM

## 2016-10-17 DIAGNOSIS — N3000 Acute cystitis without hematuria: Secondary | ICD-10-CM | POA: Diagnosis not present

## 2016-10-17 LAB — POC URINALSYSI DIPSTICK (AUTOMATED)
Bilirubin, UA: NEGATIVE
Glucose, UA: NEGATIVE
Ketones, UA: NEGATIVE
Nitrite, UA: NEGATIVE
Protein, UA: NEGATIVE
Spec Grav, UA: >=1.03
Urobilinogen, UA: 0.2
pH, UA: 6

## 2016-10-17 MED ORDER — CIPROFLOXACIN HCL 500 MG PO TABS: 500.0000 mg | ORAL_TABLET | Freq: Two times a day (BID) | ORAL | 0 refills | Status: DC

## 2016-10-17 NOTE — Patient Instructions (Addendum)
You do have recurrent cystitis - treat with cipro 500mg  twice daily for 7 days. Let us know if symptoms don't fully resolve after treatment.  We have sent a urine culture  Urinary Tract Infection Urinary tract infections (UTIs) can develop anywhere along your urinary tract. Your urinary tract is your body's drainage system for removing wastes and extra water. Your urinary tract includes two kidneys, two ureters, a bladder, and a urethra. Your kidneys are a pair of bean-shaped organs. Each kidney is about the size of your fist. They are located below your ribs, one on each side of your spine. CAUSES Infections are caused by microbes, which are microscopic organisms, including fungi, viruses, and bacteria. These organisms are so small that they can only be seen through a microscope. Bacteria are the microbes that most commonly cause UTIs. SYMPTOMS  Symptoms of UTIs may vary by age and gender of the patient and by the location of the infection. Symptoms in young women typically include a frequent and intense urge to urinate and a painful, burning feeling in the bladder or urethra during urination. Older women and men are more likely to be tired, shaky, and weak and have muscle aches and abdominal pain. A fever may mean the infection is in your kidneys. Other symptoms of a kidney infection include pain in your back or sides below the ribs, nausea, and vomiting. DIAGNOSIS To diagnose a UTI, your caregiver will ask you about your symptoms. Your caregiver will also ask you to provide a urine sample. The urine sample will be tested for bacteria and white blood cells. White blood cells are made by your body to help fight infection. TREATMENT  Typically, UTIs can be treated with medication. Because most UTIs are caused by a bacterial infection, they usually can be treated with the use of antibiotics. The choice of antibiotic and length of treatment depend on your symptoms and the type of bacteria causing your  infection. HOME CARE INSTRUCTIONS  If you were prescribed antibiotics, take them exactly as your caregiver instructs you. Finish the medication even if you feel better after you have only taken some of the medication.  Drink enough water and fluids to keep your urine clear or pale yellow.  Avoid caffeine, tea, and carbonated beverages. They tend to irritate your bladder.  Empty your bladder often. Avoid holding urine for long periods of time.  Empty your bladder before and after sexual intercourse.  After a bowel movement, women should cleanse from front to back. Use each tissue only once. SEEK MEDICAL CARE IF:   You have back pain.  You develop a fever.  Your symptoms do not begin to resolve within 3 days. SEEK IMMEDIATE MEDICAL CARE IF:   You have severe back pain or lower abdominal pain.  You develop chills.  You have nausea or vomiting.  You have continued burning or discomfort with urination. MAKE SURE YOU:   Understand these instructions.  Will watch your condition.  Will get help right away if you are not doing well or get worse.   This information is not intended to replace advice given to you by your health care provider. Make sure you discuss any questions you have with your health care provider.   Document Released: 09/17/2005 Document Revised: 08/29/2015 Document Reviewed: 01/16/2012 Elsevier Interactive Patient Education [employer].

## 2016-10-17 NOTE — Assessment & Plan Note (Addendum)
Recurrent cystitis after recent treatment with cipro 250mg  5d course. Will treat with cipro 500mg  7d course. Pt to update [place] if symptoms persist despite treatment. UCx sent.  Given 2nd cystitis this month with hematuria on UA, suggested RTC 2 wk lab visit only to recheck UA.

## 2016-10-17 NOTE — Progress Notes (Signed)
Pre visit review using our clinic review tool, if applicable. No additional management support is needed unless otherwise documented below in the visit note. 

## 2016-10-17 NOTE — Progress Notes (Addendum)
BP 126/72   Pulse 84   Temp 98 F (36.7 C) (Oral)   Wt 134 lb 4 oz (60.9 kg)   BMI 23.78 kg/m    CC: ?UTI Subjective:    Patient ID: Tara Sloan, female    DOB: [birth year], 70 y.o.   MRN: [id number]  HPI: Tara Sloan is a 70 y.o. female presenting on 10/17/2016 for Urinary Tract Infection (dysuria;bladder discomfort; started last PM)   1d h/o lower abd pain with dysuria without significant urgency or frequency. No hematuria, flank pain, fevers/chills, nausea.   She does stay well hydrated with water and cranberry juice. She does not hold urine.   Seen 10/9 by Rollene Fare with dx UTI - treated with cipro 250mg  5d antibiotic course. UCx at that time grew >100k E coli sensitive to cipro. Symptoms fully resolved at that time.   H/o recurrent UTIs.   Relevant past medical, surgical, family and social history reviewed and updated as indicated. Interim medical history since our last visit reviewed. Allergies and medications reviewed and updated. Current Outpatient Prescriptions on File Prior to Visit  Medication Sig  . acetaminophen (TYLENOL) 500 MG tablet Take 1,000 mg by mouth every 8 (eight) hours as needed for mild pain or headache.  . alendronate (FOSAMAX) 70 MG tablet TAKE 1 TABLET BY MOUTH EVERY 7 DAYS ON AN EMPTY STOMACH WITH A FULL GLASS OF WATER  . aspirin 81 MG tablet Take 81 mg by mouth every evening.   Marland Kitchen atorvastatin (LIPITOR) 80 MG tablet TAKE 1 TABLET (80 MG TOTAL) BY MOUTH DAILY.  . busPIRone (BUSPAR) 15 MG tablet Take 0.5 tablets (7.5 mg total) by mouth 2 (two) times daily.  . Calcium-Vitamin D-Vitamin K 500-100-40 MG-UNT-MCG CHEW Chew 2 tablets by mouth daily.   . Cholecalciferol (VITAMIN D-3) 1000 UNITS CAPS Take 2,000 Units by mouth daily.   . Multiple Vitamin (MULTIVITAMIN) tablet Take 1 tablet by mouth daily.    . naproxen sodium (ANAPROX) 220 MG tablet Take 220 mg by mouth 2 (two) times daily as needed (for pain). PAIN   . omeprazole (PRILOSEC) 20 MG  capsule TAKE 1 CAPSULE BY MOUTH DAILY AS NEEDED FOR HEARTBURN  . [DISCONTINUED] prochlorperazine (COMPAZINE) 10 MG tablet Take 1 tablet (10 mg total) by mouth every 6 (six) hours as needed (Nausea or vomiting).   No current facility-administered medications on file prior to visit.     Review of Systems Per HPI unless specifically indicated in ROS section     Objective:    BP 126/72   Pulse 84   Temp 98 F (36.7 C) (Oral)   Wt 134 lb 4 oz (60.9 kg)   BMI 23.78 kg/m   Wt Readings from Last 3 Encounters:  10/17/16 134 lb 4 oz (60.9 kg)  09/29/16 130 lb (59 kg)  07/15/16 131 lb 4 oz (59.5 kg)    Physical Exam  Constitutional: She appears well-developed and well-nourished. No distress.  HENT:  Mouth/Throat: Oropharynx is clear and moist. No oropharyngeal exudate.  Abdominal: Soft. Normal appearance and bowel sounds are normal. She exhibits no distension and no mass. There is no hepatosplenomegaly. There is no tenderness. There is no rigidity, no rebound, no guarding, no CVA tenderness and negative Murphy's sign.  Psychiatric: She has a normal mood and affect.  Nursing note and vitals reviewed.  Results for orders placed or performed in visit on 10/17/16  POCT Urinalysis Dipstick (Automated)  Result Value Ref Range   Color, UA Yellow  Clarity, UA Cloudy    Glucose, UA Negative    Bilirubin, UA Negative    Ketones, UA Negative    Spec Grav, UA >=1.030    Blood, UA 3+    pH, UA 6.0    Protein, UA Negative    Urobilinogen, UA 0.2    Nitrite, UA Negative    Leukocytes, UA large (3+) (A) Negative   Lab Results  Component Value Date   CREATININE 0.99 06/30/2016       Assessment & Plan:   Problem List Items Addressed This Visit    Acute cystitis - Primary    Recurrent cystitis after recent treatment with cipro 250mg  5d course. Will treat with cipro 500mg  7d course. Pt to update [place] if symptoms persist despite treatment. UCx sent.  Given 2nd cystitis this month with  hematuria on UA, suggested RTC 2 wk lab visit only to recheck UA.      Relevant Orders   Urine culture    Other Visit Diagnoses    Dysuria       Relevant Orders   POCT Urinalysis Dipstick (Automated) (Completed)       Follow up plan: Return if symptoms worsen or fail to improve.  Ria Bush, MD

## 2016-10-20 LAB — URINE CULTURE

## 2016-10-31 ENCOUNTER — Encounter: Payer: Self-pay | Admitting: Family Medicine

## 2016-10-31 ENCOUNTER — Ambulatory Visit (INDEPENDENT_AMBULATORY_CARE_PROVIDER_SITE_OTHER): Payer: Medicare Other | Admitting: Family Medicine

## 2016-10-31 VITALS — BP 136/82 | HR 72 | Temp 97.6°F | Wt 136.2 lb

## 2016-10-31 DIAGNOSIS — N3001 Acute cystitis with hematuria: Secondary | ICD-10-CM

## 2016-10-31 LAB — POC URINALSYSI DIPSTICK (AUTOMATED)
Bilirubin, UA: NEGATIVE
Blood, UA: NEGATIVE
Glucose, UA: NEGATIVE
Ketones, UA: NEGATIVE
Leukocytes, UA: NEGATIVE
Nitrite, UA: NEGATIVE
Protein, UA: NEGATIVE
Spec Grav, UA: 1.025
Urobilinogen, UA: 0.2
pH, UA: 6

## 2016-10-31 NOTE — Assessment & Plan Note (Signed)
I brought her back for rpt UA given recurrent hematuria (in setting of recurrent UTI). Rpt UA today without hematuria. I actually wanted pt to come in for lab visit, not OV. Will no charge visit today.

## 2016-10-31 NOTE — Patient Instructions (Addendum)
Repeat urine returned normal. I'm glad you're feeling better! Let us know if any symptoms return.  No charge visit.

## 2016-10-31 NOTE — Progress Notes (Signed)
BP 136/82   Pulse 72   Temp 97.6 F (36.4 C) (Oral)   Wt 136 lb 4 oz (61.8 kg)   BMI 24.14 kg/m    CC: UTI f/u Subjective:    Patient ID: Tara Sloan, female    DOB: 1946-04-28, 70 y.o.   MRN: [id number]  HPI: Tara Sloan is a 70 y.o. female presenting on 10/31/2016 for Urinary Tract Infection (recheck)   Seen 10/17/2016 with recurrent UTI sxs. UCx grew 50-100k Ecoli resistant to ampicillin but sensitive to all others. Treated with 7d cipro 500mg  course. Overall feeling fully better.   From prior visit: Recurrent cystitis after recent treatment with cipro 250mg  5d course. Will treat with cipro 500mg  7d course. Pt to update [place] if symptoms persist despite treatment. UCx sent.  Given 2nd cystitis this month with hematuria on UA, suggested RTC 2 wk lab visit only to recheck UA.  Relevant past medical, surgical, family and social history reviewed and updated as indicated. Interim medical history since our last visit reviewed. Allergies and medications reviewed and updated. Current Outpatient Prescriptions on File Prior to Visit  Medication Sig  . acetaminophen (TYLENOL) 500 MG tablet Take 1,000 mg by mouth every 8 (eight) hours as needed for mild pain or headache.  . alendronate (FOSAMAX) 70 MG tablet TAKE 1 TABLET BY MOUTH EVERY 7 DAYS ON AN EMPTY STOMACH WITH A FULL GLASS OF WATER  . aspirin 81 MG tablet Take 81 mg by mouth every evening.   Marland Kitchen atorvastatin (LIPITOR) 80 MG tablet TAKE 1 TABLET (80 MG TOTAL) BY MOUTH DAILY.  . busPIRone (BUSPAR) 15 MG tablet Take 0.5 tablets (7.5 mg total) by mouth 2 (two) times daily.  . Calcium-Vitamin D-Vitamin K 500-100-40 MG-UNT-MCG CHEW Chew 2 tablets by mouth daily.   . Cholecalciferol (VITAMIN D-3) 1000 UNITS CAPS Take 2,000 Units by mouth daily.   . Multiple Vitamin (MULTIVITAMIN) tablet Take 1 tablet by mouth daily.    . naproxen sodium (ANAPROX) 220 MG tablet Take 220 mg by mouth 2 (two) times daily as needed (for pain). PAIN   . omeprazole (PRILOSEC) 20 MG capsule TAKE 1 CAPSULE BY MOUTH DAILY AS NEEDED FOR HEARTBURN  . [DISCONTINUED] prochlorperazine (COMPAZINE) 10 MG tablet Take 1 tablet (10 mg total) by mouth every 6 (six) hours as needed (Nausea or vomiting).   No current facility-administered medications on file prior to visit.     Review of Systems Per HPI unless specifically indicated in ROS section     Objective:    BP 136/82   Pulse 72   Temp 97.6 F (36.4 C) (Oral)   Wt 136 lb 4 oz (61.8 kg)   BMI 24.14 kg/m   Wt Readings from Last 3 Encounters:  10/31/16 136 lb 4 oz (61.8 kg)  10/17/16 134 lb 4 oz (60.9 kg)  09/29/16 130 lb (59 kg)    Physical Exam Results for orders placed or performed in visit on 10/17/16  Urine culture  Result Value Ref Range   Culture ESCHERICHIA COLI    Colony Count 50,000-100,000 CFU/mL    Organism ID, Bacteria ESCHERICHIA COLI       Susceptibility   Escherichia coli -  (no method available)    AMPICILLIN >=32 Resistant     AMOX/CLAVULANIC 16 Intermediate     AMPICILLIN/SULBACTAM >=32 Resistant     PIP/TAZO <=4 Sensitive     IMIPENEM <=0.25 Sensitive     CEFAZOLIN <=4 Not Reportable     CEFTRIAXONE <=  1 Sensitive     CEFTAZIDIME <=1 Sensitive     CEFEPIME <=1 Sensitive     GENTAMICIN <=1 Sensitive     TOBRAMYCIN <=1 Sensitive     CIPROFLOXACIN <=0.25 Sensitive     LEVOFLOXACIN <=0.12 Sensitive     NITROFURANTOIN <=16 Sensitive     TRIMETH/SULFA* <=20 Sensitive      * NR=NOT REPORTABLE,SEE COMMENTORAL therapy:A cefazolin MIC of <32 predicts susceptibility to the oral agents cefaclor,cefdinir,cefpodoxime,cefprozil,cefuroxime,cephalexin,and loracarbef when used for therapy of uncomplicated UTIs due to E.coli,K.pneumomiae,and P.mirabilis. PARENTERAL therapy: A cefazolinMIC of >8 indicates resistance to parenteralcefazolin. An alternate test method must beperformed to confirm susceptibility to parenteralcefazolin.  POCT Urinalysis Dipstick (Automated)  Result  Value Ref Range   Color, UA Yellow    Clarity, UA Cloudy    Glucose, UA Negative    Bilirubin, UA Negative    Ketones, UA Negative    Spec Grav, UA >=1.030    Blood, UA 3+    pH, UA 6.0    Protein, UA Negative    Urobilinogen, UA 0.2    Nitrite, UA Negative    Leukocytes, UA large (3+) (A) Negative      Assessment & Plan:   Problem List Items Addressed This Visit    Acute cystitis - Primary    I brought her back for rpt UA given recurrent hematuria (in setting of recurrent UTI). Rpt UA today without hematuria. I actually wanted pt to come in for lab visit, not OV. Will no charge visit today.       Relevant Orders   POCT Urinalysis Dipstick (Automated)       Follow up plan: No Follow-up on file.  Ria Bush, MD

## 2016-10-31 NOTE — Progress Notes (Signed)
Pre visit review using our clinic review tool, if applicable. No additional management support is needed unless otherwise documented below in the visit note. 

## 2016-12-17 ENCOUNTER — Other Ambulatory Visit: Payer: Self-pay | Admitting: *Deleted

## 2016-12-17 MED ORDER — ATORVASTATIN CALCIUM 80 MG PO TABS: ORAL_TABLET | ORAL | 2 refills | Status: DC

## 2017-01-15 ENCOUNTER — Other Ambulatory Visit: Payer: Self-pay | Admitting: *Deleted

## 2017-01-15 MED ORDER — ATORVASTATIN CALCIUM 80 MG PO TABS: ORAL_TABLET | ORAL | 2 refills | Status: DC

## 2017-03-10 ENCOUNTER — Telehealth: Payer: Self-pay | Admitting: Hematology and Oncology

## 2017-03-10 ENCOUNTER — Other Ambulatory Visit (HOSPITAL_BASED_OUTPATIENT_CLINIC_OR_DEPARTMENT_OTHER): Payer: Medicare Other

## 2017-03-10 ENCOUNTER — Ambulatory Visit (HOSPITAL_BASED_OUTPATIENT_CLINIC_OR_DEPARTMENT_OTHER): Payer: Medicare Other | Admitting: Hematology and Oncology

## 2017-03-10 ENCOUNTER — Encounter: Payer: Self-pay | Admitting: Hematology and Oncology

## 2017-03-10 DIAGNOSIS — Z8572 Personal history of non-Hodgkin lymphomas: Secondary | ICD-10-CM | POA: Diagnosis not present

## 2017-03-10 DIAGNOSIS — C8331 Diffuse large B-cell lymphoma, lymph nodes of head, face, and neck: Secondary | ICD-10-CM

## 2017-03-10 LAB — CBC WITH DIFFERENTIAL/PLATELET
BASO%: 0.7 % (ref 0.0–2.0)
Basophils Absolute: 0.1 10*3/uL (ref 0.0–0.1)
EOS%: 1.2 % (ref 0.0–7.0)
Eosinophils Absolute: 0.1 10*3/uL (ref 0.0–0.5)
HCT: 39.3 % (ref 34.8–46.6)
HGB: 13.2 g/dL (ref 11.6–15.9)
LYMPH%: 29.6 % (ref 14.0–49.7)
MCH: 30.2 pg (ref 25.1–34.0)
MCHC: 33.7 g/dL (ref 31.5–36.0)
MCV: 89.4 fL (ref 79.5–101.0)
MONO#: 0.9 10*3/uL (ref 0.1–0.9)
MONO%: 11.3 % (ref 0.0–14.0)
NEUT#: 4.3 10*3/uL (ref 1.5–6.5)
NEUT%: 57.2 % (ref 38.4–76.8)
Platelets: 189 10*3/uL (ref 145–400)
RBC: 4.39 10*6/uL (ref 3.70–5.45)
RDW: 14.2 % (ref 11.2–14.5)
WBC: 7.5 10*3/uL (ref 3.9–10.3)
lymph#: 2.2 10*3/uL (ref 0.9–3.3)

## 2017-03-10 LAB — COMPREHENSIVE METABOLIC PANEL
ALT: 17 U/L (ref 0–55)
AST: 20 U/L (ref 5–34)
Albumin: 3.7 g/dL (ref 3.5–5.0)
Alkaline Phosphatase: 75 U/L (ref 40–150)
Anion Gap: 8 mEq/L (ref 3–11)
BUN: 28.3 mg/dL — ABNORMAL HIGH (ref 7.0–26.0)
CO2: 27 mEq/L (ref 22–29)
Calcium: 9.4 mg/dL (ref 8.4–10.4)
Chloride: 109 mEq/L (ref 98–109)
Creatinine: 0.9 mg/dL (ref 0.6–1.1)
EGFR: 67 mL/min/{1.73_m2} — ABNORMAL LOW (ref >90)
Glucose: 79 mg/dl (ref 70–140)
Potassium: 4.1 mEq/L (ref 3.5–5.1)
Sodium: 144 mEq/L (ref 136–145)
Total Bilirubin: 0.43 mg/dL (ref 0.20–1.20)
Total Protein: 6.3 g/dL — ABNORMAL LOW (ref 6.4–8.3)

## 2017-03-10 NOTE — Assessment & Plan Note (Signed)
Clinically, she has no signs of recurrence. She is a long-term cancer survivor. The patient is educated to watch out for signs of disease recurrence. I have not made a return appointment for the patient to come back. She is comfortable with the plan

## 2017-03-10 NOTE — Telephone Encounter (Signed)
Return if symptoms worsen or fail to improve, for No new orders.

## 2017-03-10 NOTE — Progress Notes (Signed)
[facility] OFFICE PROGRESS NOTE  Patient Care Team: Abner Greenspan, MD as PCP - General Minna Merritts, MD as Consulting Physician (Cardiology) Lyla Glassing, MD as Referring Physician (Ophthalmology)  SUMMARY OF ONCOLOGIC HISTORY:  This is a pleasant lady who was diagnosed with lymphoma. According to the patient, she fell an abnormal lump in the tonsil area that prompted an evaluation around July of 2012. She was first diagnosed with streptococcal infection treated with antibiotics that never went away. 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 She subsequently underwent further staging bone marrow aspirate and biopsy, and was subsequently found to have stage I disease In 2013, she completed 3 cycles of R. CHOP chemotherapy. She had profound fatigue and feeling unwell while on chemotherapy including nausea. 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 She feels well. Denies recent infection. No recent lymphadenopathy. Denies anorexia or weight loss. No abnormal neck sweats  REVIEW OF SYSTEMS:   Constitutional: Denies fevers, chills or abnormal weight loss Eyes: Denies blurriness of vision Ears, nose, mouth, throat, and face: Denies mucositis or sore throat Respiratory: Denies cough, dyspnea or wheezes Cardiovascular: Denies palpitation, chest discomfort or lower extremity swelling Gastrointestinal:  Denies nausea, heartburn or change in bowel habits Skin: Denies abnormal skin rashes Lymphatics: Denies new lymphadenopathy or easy bruising Neurological:Denies numbness, tingling or new weaknesses Behavioral/Psych: Mood is stable, no  new changes  All other systems were reviewed with the patient and are negative.  I have reviewed the past medical history, past surgical history, social history and family history with the patient and they are unchanged from previous note.  ALLERGIES:  is allergic to pnu-imune [pneumococcal polysaccharide vaccine].  MEDICATIONS:  Current Outpatient Prescriptions  Medication Sig Dispense Refill  . acetaminophen (TYLENOL) 500 MG tablet Take 1,000 mg by mouth every 8 (eight) hours as needed for mild pain or headache.    . alendronate (FOSAMAX) 70 MG tablet TAKE 1 TABLET BY MOUTH EVERY 7 DAYS ON AN EMPTY STOMACH WITH A FULL GLASS OF WATER 12 tablet 3  . aspirin 81 MG tablet Take 81 mg by mouth every evening.     Marland Kitchen atorvastatin (LIPITOR) 80 MG tablet TAKE 1 TABLET (80 MG TOTAL) BY MOUTH DAILY. 90 tablet 2  . busPIRone (BUSPAR) 15 MG tablet Take 0.5 tablets (7.5 mg total) by mouth 2 (two) times daily. 90 tablet 3  . Calcium-Vitamin D-Vitamin K 500-100-40 MG-UNT-MCG CHEW Chew 2 tablets by mouth daily.     . Cholecalciferol (VITAMIN D-3) 1000 UNITS CAPS Take 2,000 Units by mouth daily.     . Multiple Vitamin (MULTIVITAMIN) tablet Take 1 tablet by mouth daily.      . naproxen sodium (ANAPROX) 220 MG tablet Take 220 mg by mouth 2 (two) times daily as needed (for pain). PAIN     . omeprazole (PRILOSEC) 20 MG capsule TAKE 1 CAPSULE BY MOUTH DAILY AS NEEDED FOR HEARTBURN 90 capsule 3   No current facility-administered medications for this visit.     PHYSICAL EXAMINATION: ECOG PERFORMANCE STATUS: 0 - Asymptomatic  Vitals:   03/10/17 1106  BP: 117/74  Pulse: 73  Resp: 17  Temp: 98.1 F (36.7 C)   Filed Weights   03/10/17  1106  Weight: 139 lb 9.6 oz (63.3 kg)    GENERAL:alert, no distress and comfortable SKIN: skin color, texture, turgor are normal, no rashes or significant lesions EYES: normal, Conjunctiva are pink and non-injected, sclera clear OROPHARYNX:no exudate, no erythema and  lips, buccal mucosa, and tongue normal  NECK: supple, thyroid normal size, non-tender, without nodularity LYMPH:  no palpable lymphadenopathy in the cervical, axillary or inguinal LUNGS: clear to auscultation and percussion with normal breathing effort HEART: regular rate & rhythm and no murmurs and no lower extremity edema ABDOMEN:abdomen soft, non-tender and normal bowel sounds Musculoskeletal:no cyanosis of digits and no clubbing  NEURO: alert & oriented x 3 with fluent speech, no focal motor/sensory deficits  LABORATORY DATA:  I have reviewed the data as listed    Component Value Date/Time   NA 144 03/10/2017 1025   K 4.1 03/10/2017 1025   CL 107 06/30/2016 0813   CL 105 03/08/2013 0851   CO2 27 03/10/2017 1025   GLUCOSE 79 03/10/2017 1025   GLUCOSE 108 (H) 03/08/2013 0851   BUN 28.3 (H) 03/10/2017 1025   CREATININE 0.9 03/10/2017 1025   CALCIUM 9.4 03/10/2017 1025   PROT 6.3 (L) 03/10/2017 1025   ALBUMIN 3.7 03/10/2017 1025   AST 20 03/10/2017 1025   ALT 17 03/10/2017 1025   ALKPHOS 75 03/10/2017 1025   BILITOT 0.43 03/10/2017 1025   GFRNONAA 65 (L) 03/11/2015 1656   GFRAA 75 (L) 03/11/2015 1656    No results found for: SPEP, UPEP  Lab Results  Component Value Date   WBC 7.5 03/10/2017   NEUTROABS 4.3 03/10/2017   HGB 13.2 03/10/2017   HCT 39.3 03/10/2017   MCV 89.4 03/10/2017   PLT 189 03/10/2017      Chemistry      Component Value Date/Time   NA 144 03/10/2017 1025   K 4.1 03/10/2017 1025   CL 107 06/30/2016 0813   CL 105 03/08/2013 0851   CO2 27 03/10/2017 1025   BUN 28.3 (H) 03/10/2017 1025   CREATININE 0.9 03/10/2017 1025      Component Value Date/Time   CALCIUM 9.4 03/10/2017 1025   ALKPHOS 75 03/10/2017 1025   AST 20 03/10/2017 1025   ALT 17 03/10/2017 1025   BILITOT 0.43 03/10/2017 1025      ASSESSMENT & PLAN:  History of B-cell lymphoma Clinically, she has no signs of recurrence. She is a long-term cancer survivor. The patient is  educated to watch out for signs of disease recurrence. I have not made a return appointment for the patient to come back. She is comfortable with the plan    No orders of the defined types were placed in this encounter.  All questions were answered. The patient knows to call the clinic with any problems, questions or concerns. No barriers to learning was detected. I spent 10 minutes counseling the patient face to face. The total time spent in the appointment was 15 minutes and more than 50% was on counseling and review of test results     Heath Lark, MD 03/10/2017 1:54 PM

## 2017-03-23 ENCOUNTER — Ambulatory Visit (INDEPENDENT_AMBULATORY_CARE_PROVIDER_SITE_OTHER): Payer: Medicare Other | Admitting: Family Medicine

## 2017-03-23 ENCOUNTER — Ambulatory Visit (INDEPENDENT_AMBULATORY_CARE_PROVIDER_SITE_OTHER)
Admission: RE | Admit: 2017-03-23 | Discharge: 2017-03-23 | Disposition: A | Discharge status: Home / Self Care | Payer: Medicare Other | Source: Ambulatory Visit | Attending: Family Medicine | Admitting: Family Medicine

## 2017-03-23 ENCOUNTER — Encounter: Payer: Self-pay | Admitting: Family Medicine

## 2017-03-23 VITALS — BP 114/62 | HR 89 | Temp 98.6°F | Ht 63.0 in | Wt 139.5 lb

## 2017-03-23 DIAGNOSIS — M1711 Unilateral primary osteoarthritis, right knee: Secondary | ICD-10-CM

## 2017-03-23 DIAGNOSIS — M25561 Pain in right knee: Secondary | ICD-10-CM

## 2017-03-23 MED ORDER — MELOXICAM 7.5 MG PO TABS
7.5000 mg | ORAL_TABLET | Freq: Every day | ORAL | 2 refills | Status: DC
Start: 2017-03-23 — End: 2017-06-15

## 2017-03-23 MED ORDER — METHYLPREDNISOLONE ACETATE 40 MG/ML IJ SUSP
80.0000 mg | Freq: Once | INTRAMUSCULAR | Status: AC
  Administered 2017-03-23: 80 mg via INTRA_ARTICULAR

## 2017-03-23 NOTE — Progress Notes (Signed)
Pre visit review using our clinic review tool, if applicable. No additional management support is needed unless otherwise documented below in the visit note. 

## 2017-03-23 NOTE — Progress Notes (Signed)
Dr. Frederico Hamman T. Ascencion Stegner, MD, [facility] [name], [address] Phone: [phone] Fax: [phone]  03/23/2017  Patient: Tara Sloan, MRN: [id number], DOB: 1946-07-19, 71 y.o.  Primary Physician:  Loura Pardon, MD   Chief Complaint  Patient presents with  . Knee Pain    Right   Subjective:   Tara Sloan is a 71 y.o. very pleasant female patient who presents with the following:  R knee, did not do anything much. Last zumba class, felt like she hurt her R knee. Something like 5 years ago - last class. No particular injury. She reports that she is generally very active for age.  She is doing something relatively active every day, and she has no particular injury that she can recall with her right knee.  She has had some mild swelling.  She has pain on the medial and lateral joint lines, and this is worsened within the last few weeks.  Compared to how her knee worse 4-6 months ago, she is feeling more significant pain.  She does think that it is better today compared to yesterday.  No mechanical symptoms, and she is not having any locking up of her joint.  Past Medical History, Surgical History, Social History, Family History, Problem List, Medications, and Allergies have been reviewed and updated if relevant.  Patient Active Problem List   Diagnosis Date Noted  . Acute cystitis 10/17/2016  . Stress reaction 08/04/2016  . Estrogen deficiency 07/04/2016  . Aortic atherosclerosis ([place]) 07/06/2015  . Colon cancer screening 06/29/2015  . Routine general medical examination at a health care facility 06/21/2015  . Chest pain 03/11/2015  . Left bundle branch block (LBBB) 07/06/2014  . Encounter for Medicare annual wellness exam 03/17/2013  . 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 Pain in joint, ankle and foot 03/16/2013  . Hyperglycemia 12/01/2011  . History of B-cell lymphoma 11/22/2011  . ARTHRITIS, HANDS,  BILATERAL 12/12/2010  . Dyslipidemia 04/02/2008  . ALLERGIC RHINITIS 04/02/2008  . GERD 04/02/2008  . Osteopenia 09/13/2007  . ALLERGY 08/06/2007    Past Medical History:  Diagnosis Date  . Allergic rhinitis   . Arthritis    "hands; probably qwhere" (03/12/2015)  . Basal cell carcinoma ~ 2010   "leg"  . Chronic back pain   . Diffuse large B cell lymphoma ([place]) 11/07/11   dx from tonsilar biopsy  . External hemorrhoid   . GERD (gastroesophageal reflux disease)   . History of radiation therapy 03/22/12 - 04/09/12   right oropharynx/neck  . HLD (hyperlipidemia)   . LBBB (left bundle branch block) dx'd 05/2014  . Non Hodgkin's lymphoma ([place])   . Osteopenia     Past Surgical History:  Procedure Laterality Date  . ABDOMINAL HYSTERECTOMY  1988   "partial; for fibroids"  . BASAL CELL CARCINOMA EXCISION Right ~ 2010   "leg"  . BREAST BIOPSY Left 12/03   Negative  . COLONOSCOPY  5/06   Ext. hemms  . DILATION AND CURETTAGE OF UTERUS  1987  . TONSILLECTOMY  11/07/2011   Procedure: TONSILLECTOMY;  Surgeon: Rozetta Nunnery, MD;  Location: [facility];  Service: ENT;  Laterality: N/A;  . TONSILLECTOMY  10/2011   "right was cancerous"  . TUBAL LIGATION  1987    Social History   Social History  . Marital status: Married    Spouse name: N/A  . Number of children: 2  . Years of  education: N/A   Occupational History  . Employed     [employer], Glass blower/designer   Social History Main Topics  . Smoking status: Former Smoker    Packs/day: 0.25    Years: 10.00    Types: Cigarettes    Quit date: 11/04/1974  . Smokeless tobacco: Never Used  . Alcohol use 0.0 oz/week     Comment: rare  . Drug use: No  . Sexual activity: No   Other Topics Concern  . Not on file   Social History Narrative   Married (husband with a lot of health problems and CAD)      2 children      Exercise-going to [employer]      Employed-plans to retire at 35    Family History    Problem Relation Age of Onset  . Heart attack Father 20  . Heart disease Father   . Stroke Mother 56  . Breast cancer Paternal Aunt   . Cancer Paternal Aunt   . Cancer Paternal Aunt   . Hyperlipidemia      Family history  . Colon cancer Neg Hx   . Colon polyps Neg Hx     Allergies  Allergen Reactions  . Pnu-Imune [Pneumococcal Polysaccharide Vaccine] Hives    Medication list reviewed and updated in full in [place].  GEN: No fevers, chills. Nontoxic. Primarily MSK c/o today. MSK: Detailed in the HPI GI: tolerating PO intake without difficulty Neuro: No numbness, parasthesias, or tingling associated. Otherwise the pertinent positives of the ROS are noted above.   Objective:   BP 114/62   Pulse 89   Temp 98.6 F (37 C) (Oral)   Ht 5\' 3"  (1.6 m)   Wt 139 lb 8 oz (63.3 kg)   BMI 24.71 kg/m    GEN: WDWN, NAD, Non-toxic, Alert & Oriented x 3 HEENT: Atraumatic, Normocephalic.  Ears and Nose: No external deformity. EXTR: No clubbing/cyanosis/edema NEURO: Normal gait.  PSYCH: Normally interactive. Conversant. Not depressed or anxious appearing.  Calm demeanor.   Knee:  R Gait: Normal heel toe pattern ROM: 0-125 Effusion: mild Echymosis or edema: none Patellar tendon NT Painful PLICA: neg Patellar grind: negative Medial and lateral patellar facet loading: negative medial and lateral joint lines:medial > lateral pain Mcmurray's mild pain Flexion-pinch mild pain Varus and valgus stress: stable Lachman: neg Ant and Post drawer: neg Hip abduction, IR, ER: WNL Hip flexion str: 5/5 Hip abd: 5/5 Quad: 5/5 VMO atrophy:No Hamstring concentric and eccentric: 5/5   Radiology: Dg Knee Ap/lat W/sunrise Right  Result Date: 03/23/2017 CLINICAL DATA:  Right knee pain EXAM: RIGHT KNEE 3 VIEWS COMPARISON:  None. FINDINGS: For age minimal degenerative joint disease of the right knee is noted primarily involving the medial compartment where there is more loss of joint  space and sclerosis. Very little degenerative spurring is present. No joint effusion is seen. The patella is normally positioned. IMPRESSION: Relatively mild medial compartment degenerative joint disease of the right knee for age. Electronically Signed   By: Ivar Drape M.D.   On: 03/23/2017 16:42     Assessment and Plan:   Primary osteoarthritis of right knee  Right knee pain, unspecified chronicity - Plan: DG Knee AP/LAT W/Sunrise Right, methylPREDNISolone acetate (DEPO-MEDROL) injection 80 mg  Probable osteoarthritic flare, cannot exclude degenerative meniscal tear and a 71 year old.  Range of motion is quite good, and only mild arthritis on radiographs.  Encouraged to continue activity, strengthening, Tylenol as needed, and we  will add meloxicam for once daily dosing of NSAIDs as needed.  Ice or heat as needed, based on the patient's pain control.  Knee Injection, RIGHT Patient verbally consented to procedure. Risks (including potential rare risk of infection), benefits, and alternatives explained. Sterilely prepped with Chloraprep. Ethyl cholride used for anesthesia. 8 cc Lidocaine 1% mixed with 2 mL Depo-Medrol 40 mg injected using the anteromedial approach without difficulty. No complications with procedure and tolerated well. Patient had decreased pain post-injection.   Follow-up: prn only  Meds ordered this encounter  Medications  . meloxicam (MOBIC) 7.5 MG tablet    Sig: Take 1 tablet (7.5 mg total) by mouth daily.    Dispense:  30 tablet    Refill:  2  . methylPREDNISolone acetate (DEPO-MEDROL) injection 80 mg   There are no discontinued medications. Orders Placed This Encounter  Procedures  . DG Knee AP/LAT W/Sunrise Right    Signed,  Aayat Hajjar T. Merion Grimaldo, MD   Allergies as of 03/23/2017      Reactions   Pnu-imune [pneumococcal Polysaccharide Vaccine] Hives      Medication List       Accurate as of 03/23/17 11:59 PM. Always use your most recent med list.           acetaminophen 500 MG tablet Commonly known as:  TYLENOL Take 1,000 mg by mouth every 8 (eight) hours as needed for mild pain or headache.   alendronate 70 MG tablet Commonly known as:  FOSAMAX TAKE 1 TABLET BY MOUTH EVERY 7 DAYS ON AN EMPTY STOMACH WITH A FULL GLASS OF WATER   aspirin 81 MG tablet Take 81 mg by mouth every evening.   atorvastatin 80 MG tablet Commonly known as:  LIPITOR TAKE 1 TABLET (80 MG TOTAL) BY MOUTH DAILY.   busPIRone 15 MG tablet Commonly known as:  BUSPAR Take 0.5 tablets (7.5 mg total) by mouth 2 (two) times daily.   Calcium-Vitamin D-Vitamin K 500-100-40 MG-UNT-MCG Chew Chew 2 tablets by mouth daily.   meloxicam 7.5 MG tablet Commonly known as:  MOBIC Take 1 tablet (7.5 mg total) by mouth daily.   multivitamin tablet Take 1 tablet by mouth daily.   naproxen sodium 220 MG tablet Commonly known as:  ANAPROX Take 220 mg by mouth 2 (two) times daily as needed (for pain). PAIN   omeprazole 20 MG capsule Commonly known as:  PRILOSEC TAKE 1 CAPSULE BY MOUTH DAILY AS NEEDED FOR HEARTBURN   Vitamin D-3 1000 units Caps Take 2,000 Units by mouth daily.

## 2017-06-15 ENCOUNTER — Other Ambulatory Visit: Payer: Self-pay | Admitting: Family Medicine

## 2017-06-15 NOTE — Telephone Encounter (Signed)
Received refill electronically Last refill 03/23/17 #30/2, last office visit same date

## 2017-06-27 NOTE — Progress Notes (Signed)
Patient ID: [facility], female   DOB: 02-Dec-1946, 71 y.o.   MRN: [id number] Cardiology Office Note  Date:  06/29/2017   ID:  Tara Sloan, DOB [birth year], MRN [id number]  PCP:  Tara Greenspan, MD   Chief Complaint  Patient presents with  . OTHER    1 yr f/u no complaints today. Meds reviewed verbally with pt.    HPI:   Tara Sloan is a very pleasant 71 year old woman with  atherosclerosis/PVD in the aortic arch, descending thoracic aorta. Also with mild CAD any proximal LAD, left circumflex and RCA. smoking history for less than 10 years large cell non-Hodgkin's lymphoma, chemo and xrt left bundle branch block that comes and goes on EKG Dad dies in his 63s of MI She presents today for follow-up of her CAD, PAD, hyperlipidemia  In follow-up today, she reports that she feels well, no complaints. Blood pressure well controlled Regular exercise walking a [employer] dog 2 times a day Denies any chest pain symptoms concerning for angina Tolerating high-dose Lipitor without any side effects of myalgias  Previous total cholesterol 180, LDL greater than 100   she was eating high carbohydrate foods the time, repeat cholesterol panel scheduled August 2018  EKG shows normal sinus rhythm with rate 78 bpm, no significant ST or T-wave changes  Other past medical history History of large cell non-Hodgkin's lymphoma. She was treated in 2013 with chemotherapy and radiation treatment. Followup studies including full-body CT scan and PET scan did not show recurrent disease or metastases. CT scan did show coronary artery disease in the LAD, OM, RCA and aorta.  She does report having prior stress test in 2009 which showed no ischemia.  Review of her CT scans from September 2013 in March 2014 shows atherosclerosis/PVD in the aortic arch, descending thoracic aorta. Also with mild CAD any proximal LAD, left circumflex and RCA. Difficult to determine extent given motion artifact.  Left main was not well visualized in both CT scans given motion artifact. Otherwise normal size cardiac chambers.  prior EKG EKG shows normal sinus rhythm with rate 94 beats per minute with no significant ST or T wave changes. EKG today shows normal sinus rhythm with rate 74 beats per minute, left bundle branch block (new)  Father had first MI in his mid 79s. 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  PSH:    Past Surgical History:  Procedure Laterality Date  . ABDOMINAL HYSTERECTOMY  1988   "partial; for fibroids"  . BASAL CELL CARCINOMA EXCISION Right ~ 2010   "leg"  . BREAST BIOPSY Left 12/03   Negative  . COLONOSCOPY  5/06   Ext. hemms  . DILATION AND CURETTAGE OF UTERUS  1987  . TONSILLECTOMY  11/07/2011   Procedure: TONSILLECTOMY;  Surgeon: Tara Nunnery, MD;  Location: [facility];  Service: ENT;  Laterality: N/A;  . TONSILLECTOMY  10/2011   "right was cancerous"  . TUBAL LIGATION  1987    Current Outpatient Prescriptions  Medication Sig Dispense Refill  . acetaminophen (TYLENOL) 500 MG tablet Take 1,000 mg by mouth every 8 (eight) hours as needed for mild pain or headache.    Marland Kitchen  alendronate (FOSAMAX) 70 MG tablet TAKE 1 TABLET BY MOUTH EVERY 7 DAYS ON AN EMPTY STOMACH WITH A FULL GLASS OF WATER 12 tablet 3  . aspirin 81 MG tablet Take 81 mg by mouth every evening.     Marland Kitchen atorvastatin (LIPITOR) 80 MG tablet TAKE 1 TABLET (80 MG TOTAL) BY MOUTH DAILY. 90 tablet 2  . busPIRone (BUSPAR) 15 MG tablet Take 0.5 tablets (7.5 mg total) by mouth 2 (two) times daily. 90 tablet 3  . Calcium-Vitamin  D-Vitamin K 500-100-40 MG-UNT-MCG CHEW Chew 2 tablets by mouth daily.     . Cholecalciferol (VITAMIN D-3) 1000 UNITS CAPS Take 2,000 Units by mouth daily.     . meloxicam (MOBIC) 7.5 MG tablet TAKE 1 TABLET (7.5 MG TOTAL) BY MOUTH DAILY. 30 tablet 2  . Multiple Vitamin (MULTIVITAMIN) tablet Take 1 tablet by mouth daily.      . naproxen sodium (ANAPROX) 220 MG tablet Take 220 mg by mouth 2 (two) times daily as needed (for pain). PAIN     . omeprazole (PRILOSEC) 20 MG capsule TAKE 1 CAPSULE BY MOUTH DAILY AS NEEDED FOR HEARTBURN 90 capsule 3   No current facility-administered medications for this visit.      Allergies:   Pnu-imune [pneumococcal polysaccharide vaccine]   Social History:  The patient  reports that she quit smoking about 42 years ago. Her smoking use included Cigarettes. She has a 2.50 pack-year smoking history. She has never used smokeless tobacco. She reports that she drinks alcohol. She reports that she does not use drugs.   Family History:   family history includes Breast cancer in her paternal aunt; Cancer in her paternal aunt and paternal aunt; Heart attack (age of onset: 65) in her father; Heart disease in her father; Stroke (age of onset: 77) in her mother.    Review of Systems: Review of Systems  Constitutional: Negative.   Respiratory: Negative.   Cardiovascular: Negative.   Gastrointestinal: Negative.   Musculoskeletal: Negative.   Neurological: Negative.   Psychiatric/Behavioral: Negative.   All other systems reviewed and are negative.    PHYSICAL EXAM: VS:  BP 112/70 (BP Location: Left Arm, Patient Position: Sitting, Cuff Size: Normal)   Pulse 78   Ht 5' 3.5" (1.613 m)   Wt 136 lb (61.7 kg)   BMI 23.71 kg/m  , BMI Body mass index is 23.71 kg/m. GEN: Well nourished, well developed, in no acute distress HEENT: normal Neck: no JVD, trace carotid bruit on the left, no  masses Cardiac: RRR; 1/6 murmur right sternal border,no rubs, or gallops,no edema   Respiratory:  clear to auscultation bilaterally, normal work of breathing GI: soft, nontender, nondistended, + BS MS: no deformity or atrophy Skin: warm and dry, no rash Neuro:  Strength and sensation are intact Psych: euthymic mood, full affect    Recent Labs: 06/30/2016: TSH 2.46 03/10/2017: ALT 17; BUN 28.3; Creatinine 0.9; HGB 13.2; Platelets 189; Potassium 4.1; Sodium 144    Lipid Panel Lab Results  Component Value Date   CHOL 186 06/30/2016   HDL 36.60 (L) 06/30/2016   LDLCALC 114 (H) 06/30/2016   TRIG 176.0 (H) 06/30/2016      Wt Readings from Last 3 Encounters:  06/29/17 136 lb (61.7 kg)  03/23/17 139 lb 8 oz (63.3 kg)  03/10/17 139 lb 9.6 oz (63.3 kg)       ASSESSMENT AND PLAN:  Left bundle branch block (LBBB) - Plan: EKG 12-Lead Seen previously Previous stress test several years  ago with no ischemia Mild coronary disease on CT scan asymptomatic  Coronary artery calcinosis - Plan: EKG 12-Lead Currently with no symptoms of angina. No further workup at this time.  Aggressive cholesterol regiment   Aortic atherosclerosis (HCC) nonsmoker no diabetes  stressed importance of total cholesterol less than 150   Dyslipidemia Recommended she continue on her Lipitor 80 mg daily If repeat lab work shows LDL greater than 70, would start Zetia   Total encounter time more than 15 minutes  Greater than 50% was spent in counseling and coordination of care with the patient    Disposition:   F/U  12 months prn   Orders Placed This Encounter  Procedures  . EKG 12-Lead     Signed, Esmond Plants, M.D., Ph.D. 06/29/2017  [place], [place]

## 2017-06-29 ENCOUNTER — Ambulatory Visit (INDEPENDENT_AMBULATORY_CARE_PROVIDER_SITE_OTHER): Payer: Medicare Other | Admitting: Cardiovascular Disease

## 2017-06-29 ENCOUNTER — Encounter: Payer: Self-pay | Admitting: Cardiovascular Disease

## 2017-06-29 VITALS — BP 112/70 | HR 78 | Ht 63.5 in | Wt 136.0 lb

## 2017-06-29 DIAGNOSIS — Z8572 Personal history of non-Hodgkin lymphomas: Secondary | ICD-10-CM | POA: Diagnosis not present

## 2017-06-29 DIAGNOSIS — I700 Atherosclerosis of aorta: Secondary | ICD-10-CM | POA: Diagnosis not present

## 2017-06-29 DIAGNOSIS — I2510 Atherosclerotic heart disease of native coronary artery without angina pectoris: Secondary | ICD-10-CM | POA: Diagnosis not present

## 2017-06-29 DIAGNOSIS — E785 Hyperlipidemia, unspecified: Secondary | ICD-10-CM | POA: Diagnosis not present

## 2017-06-29 DIAGNOSIS — I2583 Coronary atherosclerosis due to lipid rich plaque: Secondary | ICD-10-CM

## 2017-06-29 NOTE — Patient Instructions (Addendum)
Goal total chol <150 Goal LDL <70  If numbers run high in 07/2017 We could add zetia  Medication Instructions:   No medication changes made  Labwork:  No new labs needed  Testing/Procedures:  No further testing at this time   Follow-Up: It was a pleasure seeing you in the office today. Please call us if you have new issues that need to be addressed before your next appt.  [phone]  Your physician wants you to follow-up in: 12 months.  You will receive a reminder letter in the mail two months in advance. If you don't receive a letter, please call our office to schedule the follow-up appointment.  If you need a refill on your cardiac medications before your next appointment, please call your pharmacy.

## 2017-07-14 ENCOUNTER — Other Ambulatory Visit: Payer: Self-pay | Admitting: *Deleted

## 2017-07-14 MED ORDER — MELOXICAM 7.5 MG PO TABS: 7.5000 mg | ORAL_TABLET | Freq: Every day | ORAL | 1 refills | Status: DC

## 2017-07-14 NOTE — Telephone Encounter (Signed)
Will refill electronically  

## 2017-07-14 NOTE — Telephone Encounter (Signed)
Last office visit 03/23/2017 with Dr. Lorelei Pont.  Last refilled 06/15/2017 for #30 with 2 refills.  Pharmacy is requesting 90 day supply.

## 2017-07-20 NOTE — Progress Notes (Signed)
Pre visit review using our clinic review tool, if applicable. No additional management support is needed unless otherwise documented below in the visit note. 

## 2017-07-27 ENCOUNTER — Ambulatory Visit (INDEPENDENT_AMBULATORY_CARE_PROVIDER_SITE_OTHER): Payer: Medicare Other

## 2017-07-27 VITALS — BP 104/68 | HR 72 | Temp 98.5°F | Ht 63.0 in | Wt 133.5 lb

## 2017-07-27 DIAGNOSIS — R944 Abnormal results of kidney function studies: Secondary | ICD-10-CM | POA: Diagnosis not present

## 2017-07-27 DIAGNOSIS — R739 Hyperglycemia, unspecified: Secondary | ICD-10-CM

## 2017-07-27 DIAGNOSIS — Z0000 Encounter for general adult medical examination without abnormal findings: Secondary | ICD-10-CM | POA: Diagnosis not present

## 2017-07-27 DIAGNOSIS — E784 Other hyperlipidemia: Secondary | ICD-10-CM | POA: Diagnosis not present

## 2017-07-27 DIAGNOSIS — M8580 Other specified disorders of bone density and structure, unspecified site: Secondary | ICD-10-CM

## 2017-07-27 DIAGNOSIS — E7849 Other hyperlipidemia: Secondary | ICD-10-CM

## 2017-07-27 NOTE — Progress Notes (Signed)
PCP notes:   Health maintenance:  Flu vaccine - addressed; pt plans to get vaccine at later date  Abnormal screenings:    Hearing - failed  Patient concerns:   None  Nurse concerns:  None  Next PCP appt:   07/31/17 @ 0830  I reviewed health advisor's note, was available for consultation, and agree with documentation and plan. Loura Pardon MD

## 2017-07-27 NOTE — Progress Notes (Signed)
Subjective:   Tara Sloan is a 71 y.o. female who presents for Medicare Annual (Subsequent) preventive examination.  Review of Systems:  N/A Cardiac Risk Factors include: advanced age (>53men, >92 women);dyslipidemia     Objective:     Vitals: BP 104/68 (BP Location: Right Arm, Patient Position: Sitting, Cuff Size: Normal)   Pulse 72   Temp 98.5 F (36.9 C) (Oral)   Ht 5\' 3"  (1.6 m) Comment: no shoes  Wt 133 lb 8 oz (60.6 kg)   SpO2 96%   BMI 23.65 kg/m   Body mass index is 23.65 kg/m.   Tobacco History  Smoking Status  . Former Smoker  . Packs/day: 0.25  . Years: 10.00  . Types: Cigarettes  . Quit date: 11/04/1974  Smokeless Tobacco  . Never Used     Counseling given: No   Past Medical History:  Diagnosis Date  . Allergic rhinitis   . Arthritis    "hands; probably qwhere" (03/12/2015)  . Basal cell carcinoma ~ 2010   "leg"  . Chronic back pain   . Diffuse large B cell lymphoma ([place]) 11/07/11   dx from tonsilar biopsy  . External hemorrhoid   . GERD (gastroesophageal reflux disease)   . History of radiation therapy 03/22/12 - 04/09/12   right oropharynx/neck  . HLD (hyperlipidemia)   . LBBB (left bundle branch block) dx'd 05/2014  . Non Hodgkin's lymphoma ([place])   . Osteopenia    Past Surgical History:  Procedure Laterality Date  . ABDOMINAL HYSTERECTOMY  1988   "partial; for fibroids"  . BASAL CELL CARCINOMA EXCISION Right ~ 2010   "leg"  . BREAST BIOPSY Left 12/03   Negative  . COLONOSCOPY  5/06   Ext. hemms  . DILATION AND CURETTAGE OF UTERUS  1987  . TONSILLECTOMY  11/07/2011   Procedure: TONSILLECTOMY;  Surgeon: Rozetta Nunnery, MD;  Location: [facility];  Service: ENT;  Laterality: N/A;  . TONSILLECTOMY  10/2011   "right was cancerous"  . TUBAL LIGATION  1987   Family History  Problem Relation Age of Onset  . Heart attack Father 69  . Heart disease Father   . Stroke Mother 39  . Breast cancer Paternal Aunt     . Cancer Paternal Aunt   . Cancer Paternal Aunt   . Hyperlipidemia Unknown        Family history  . Colon cancer Neg Hx   . Colon polyps Neg Hx    History  Sexual Activity  . Sexual activity: No    Outpatient Encounter Prescriptions as of 07/27/2017  Medication Sig  . acetaminophen (TYLENOL) 500 MG tablet Take 1,000 mg by mouth every 8 (eight) hours as needed for mild pain or headache.  . alendronate (FOSAMAX) 70 MG tablet TAKE 1 TABLET BY MOUTH EVERY 7 DAYS ON AN EMPTY STOMACH WITH A FULL GLASS OF WATER  . aspirin 81 MG tablet Take 81 mg by mouth every evening.   Marland Kitchen atorvastatin (LIPITOR) 80 MG tablet TAKE 1 TABLET (80 MG TOTAL) BY MOUTH DAILY.  . busPIRone (BUSPAR) 15 MG tablet Take 0.5 tablets (7.5 mg total) by mouth 2 (two) times daily.  . Calcium-Vitamin D-Vitamin K 500-100-40 MG-UNT-MCG CHEW Chew 2 tablets by mouth daily.   . Cholecalciferol (VITAMIN D-3) 1000 UNITS CAPS Take 2,000 Units by mouth daily.   . meloxicam (MOBIC) 7.5 MG tablet Take 1 tablet (7.5 mg total) by mouth daily.  . Multiple Vitamin (MULTIVITAMIN) tablet Take  1 tablet by mouth daily.    . naproxen sodium (ANAPROX) 220 MG tablet Take 220 mg by mouth 2 (two) times daily as needed (for pain). PAIN   . omeprazole (PRILOSEC) 20 MG capsule TAKE 1 CAPSULE BY MOUTH DAILY AS NEEDED FOR HEARTBURN   No facility-administered encounter medications on file as of 07/27/2017.     Activities of Daily Living In your present state of health, do you have any difficulty performing the following activities: 07/27/2017  Hearing? N  Vision? N  Difficulty concentrating or making decisions? N  Walking or climbing stairs? N  Dressing or bathing? N  Doing errands, shopping? N  Preparing Food and eating ? N  Using the Toilet? N  In the past six months, have you accidently leaked urine? N  Do you have problems with loss of bowel control? N  Managing your Medications? N  Managing your Finances? N  Housekeeping or managing your  Housekeeping? N  Some recent data might be hidden    Patient Care Team: Tower, Wynelle Fanny, MD as PCP - General Rockey Situ, Kathlene November, MD as Consulting Physician (Cardiology) Lyla Glassing, MD as Referring Physician (Ophthalmology)    Assessment:     Hearing Screening   125Hz  250Hz  500Hz  1000Hz  2000Hz  3000Hz  4000Hz  6000Hz  8000Hz   Right ear:   40 40 40  40    Left ear:   40 0 40  0    Vision Screening Comments: Last vision exam in Oct 2017 @ [facility]   Exercise Activities and Dietary recommendations Current Exercise Habits: Structured exercise class, Type of exercise: Other - see comments, Time (Minutes): 60, Frequency (Times/Week): 3, Weekly Exercise (Minutes/Week): 180, Intensity: Moderate, Exercise limited by: None identified  Goals    . Increase physical activity          Starting 07/27/2017, I will continue to exercise for at least 60 min 3 days per week.       Fall Risk Fall Risk  07/27/2017 07/15/2016 06/29/2015 06/27/2014 03/16/2013  Falls in the past year? No No No No No   Depression Screen PHQ 2/9 Scores 07/27/2017 07/15/2016 06/18/2016 06/29/2015  PHQ - 2 Score 0 0 6 0  PHQ- 9 Score - - 18 -     Cognitive Function MMSE - Mini Mental State Exam 07/27/2017 07/15/2016  Orientation to time 5 5  Orientation to Place 5 5  Registration 3 3  Attention/ Calculation 0 0  Recall 3 3  Language- name 2 objects 0 0  Language- repeat 1 1  Language- follow 3 step command 3 3  Language- read & follow direction 0 0  Write a sentence 0 0  Copy design 0 0  Total score 20 20       PLEASE NOTE: A Mini-Cog screen was completed. Maximum score is 20. A value of 0 denotes this part of [place] MMSE was not completed or the patient failed this part of the Mini-Cog screening.   Mini-Cog Screening Orientation to Time - Max 5 pts Orientation to Place - Max 5 pts Registration - Max 3 pts Recall - Max 3 pts Language Repeat - Max 1 pts Language Follow 3 Step Command - Max 3  pts   Immunization History  Administered Date(s) Administered  . Hepatitis B 04/08/2005, 05/06/2005, 10/08/2005  . Influenza Split 12/03/2011, 09/15/2012  . Influenza Whole 10/31/2002, 10/10/2008, 10/12/2009  . Influenza,inj,Quad PF,36+ Mos 10/10/2014, 10/30/2015  . Influenza-Unspecified 09/18/2013  . Pneumococcal Conjugate-13 06/27/2014  . Pneumococcal Polysaccharide-23  12/12/2010  . Td 11/14/2002, 03/16/2013  . Zoster 10/14/2012   Screening Tests Health Maintenance  Topic Date Due  . INFLUENZA VACCINE  03/21/2018 (Originally 07/22/2017)  . MAMMOGRAM  09/17/2017  . TETANUS/TDAP  03/17/2023  . COLONOSCOPY  09/11/2025  . DEXA SCAN  Completed  . Hepatitis C Screening  Completed  . PNA vac Low Risk Adult  Completed      Plan:     I have personally reviewed and addressed the Medicare Annual Wellness questionnaire and have noted the following in the patient's chart:  A. Medical and social history B. Use of alcohol, tobacco or illicit drugs  C. Current medications and supplements D. Functional ability and status E.  Nutritional status F.  Physical activity G. Advance directives H. List of other physicians I.  Hospitalizations, surgeries, and ER visits in previous 12 months J.  [place] to include hearing, vision, cognitive, depression L. Referrals and appointments - none  In addition, I have reviewed and discussed with patient certain preventive protocols, quality metrics, and best practice recommendations. A written personalized care plan for preventive services as well as general preventive health recommendations were provided to patient.  See attached scanned questionnaire for additional information.   Signed,   Lindell Noe, MHA, BS, LPN Health Coach

## 2017-07-27 NOTE — Patient Instructions (Signed)
Ms. Calbert , Thank you for taking time to come for your Medicare Wellness Visit. I appreciate your ongoing commitment to your health goals. Please review the following plan we discussed and let me know if I can assist you in the future.   These are the goals we discussed: Goals    . Increase physical activity          Starting 07/27/2017, I will continue to exercise for at least 60 min 3 days per week.        This is a list of the screening recommended for you and due dates:  Health Maintenance  Topic Date Due  . Flu Shot  03/21/2018*  . Mammogram  09/17/2017  . Tetanus Vaccine  03/17/2023  . Colon Cancer Screening  09/11/2025  . DEXA scan (bone density measurement)  Completed  .  Hepatitis C: One time screening is recommended by Center for Disease Control  (CDC) for  adults born from [birth year] through 1966.   Completed  . Pneumonia vaccines  Completed  *Topic was postponed. The date shown is not the original due date.   Preventive Care for Adults  A healthy lifestyle and preventive care can promote health and wellness. Preventive health guidelines for adults include the following key practices.  . A routine yearly physical is a good way to check with your health care provider about your health and preventive screening. It is a chance to share any concerns and updates on your health and to receive a thorough exam.  . Visit your dentist for a routine exam and preventive care every 6 months. Brush your teeth twice a day and floss once a day. Good oral hygiene prevents tooth decay and gum disease.  . The frequency of eye exams is based on your age, health, family medical history, use  of contact lenses, and other factors. Follow your health care provider's ecommendations for frequency of eye exams.  . Eat a healthy diet. Foods like vegetables, fruits, whole grains, low-fat dairy products, and lean protein foods contain the nutrients you need without too many calories. Decrease your intake  of foods high in solid fats, added sugars, and salt. Eat the right amount of calories for you. Get information about a proper diet from your health care provider, if necessary.  . Regular physical exercise is one of the most important things you can do for your health. Most adults should get at least 150 minutes of moderate-intensity exercise (any activity that increases your heart rate and causes you to sweat) each week. In addition, most adults need muscle-strengthening exercises on 2 or more days a week.  Silver Sneakers may be a benefit available to you. To determine eligibility, you may visit the website: www.silversneakers.com or contact program at 669-090-7826 Mon-Fri between 8AM-8PM.   . Maintain a healthy weight. The body mass index (BMI) is a screening tool to identify possible weight problems. It provides an estimate of body fat based on height and weight. Your health care provider can find your BMI and can help you achieve or maintain a healthy weight.   For adults 20 years and older: ? A BMI below 18.5 is considered underweight. ? A BMI of 18.5 to 24.9 is normal. ? A BMI of 25 to 29.9 is considered overweight. ? A BMI of 30 and above is considered obese.   . Maintain normal blood lipids and cholesterol levels by exercising and minimizing your intake of saturated fat. Eat a balanced diet with plenty of  fruit and vegetables. Blood tests for lipids and cholesterol should begin at age 27 and be repeated every 5 years. If your lipid or cholesterol levels are high, you are over 50, or you are at high risk for heart disease, you may need your cholesterol levels checked more frequently. Ongoing high lipid and cholesterol levels should be treated with medicines if diet and exercise are not working.  . If you smoke, find out from your health care provider how to quit. If you do not use tobacco, please do not start.  . If you choose to drink alcohol, please do not consume more than 2 drinks  per day. One drink is considered to be 12 ounces (355 mL) of beer, 5 ounces (148 mL) of wine, or 1.5 ounces (44 mL) of liquor.  . If you are 12-32 years old, ask your health care provider if you should take aspirin to prevent strokes.  . Use sunscreen. Apply sunscreen liberally and repeatedly throughout the day. You should seek shade when your shadow is shorter than you. Protect yourself by wearing long sleeves, pants, a wide-brimmed hat, and sunglasses year round, whenever you are outdoors.  . Once a month, do a whole body skin exam, using a mirror to look at the skin on your back. Tell your health care provider of new moles, moles that have irregular borders, moles that are larger than a pencil eraser, or moles that have changed in shape or color.

## 2017-07-28 LAB — COMPREHENSIVE METABOLIC PANEL
ALT: 12 U/L (ref 6–29)
AST: 19 U/L (ref 10–35)
Albumin: 3.9 g/dL (ref 3.6–5.1)
Alkaline Phosphatase: 50 U/L (ref 33–130)
BUN: 21 mg/dL (ref 7–25)
CO2: 23 mmol/L (ref 20–32)
Calcium: 9.1 mg/dL (ref 8.6–10.4)
Chloride: 106 mmol/L (ref 98–110)
Creat: 0.96 mg/dL — ABNORMAL HIGH (ref 0.60–0.93)
Glucose, Bld: 94 mg/dL (ref 65–99)
Potassium: 4.4 mmol/L (ref 3.5–5.3)
Sodium: 141 mmol/L (ref 135–146)
Total Bilirubin: 0.7 mg/dL (ref 0.2–1.2)
Total Protein: 6.1 g/dL (ref 6.1–8.1)

## 2017-07-28 LAB — VITAMIN D 25 HYDROXY (VIT D DEFICIENCY, FRACTURES): Vit D, 25-Hydroxy: 63 ng/mL (ref 30–100)

## 2017-07-28 LAB — LIPID PANEL
Cholesterol: 127 mg/dL (ref <200)
HDL: 32 mg/dL — ABNORMAL LOW (ref >50)
LDL Cholesterol: 67 mg/dL (ref <100)
Total CHOL/HDL Ratio: 4 Ratio (ref <5.0)
Triglycerides: 142 mg/dL (ref <150)
VLDL: 28 mg/dL (ref <30)

## 2017-07-28 LAB — HEMOGLOBIN A1C
Hgb A1c MFr Bld: 5.6 % (ref <5.7)
Mean Plasma Glucose: 114 mg/dL

## 2017-07-28 LAB — TSH: TSH: 2.02 mIU/L

## 2017-07-31 ENCOUNTER — Encounter: Payer: Self-pay | Admitting: Family Medicine

## 2017-07-31 ENCOUNTER — Ambulatory Visit (INDEPENDENT_AMBULATORY_CARE_PROVIDER_SITE_OTHER): Payer: Medicare Other | Admitting: Family Medicine

## 2017-07-31 VITALS — BP 110/62 | HR 78 | Temp 97.9°F | Ht 63.0 in | Wt 134.5 lb

## 2017-07-31 DIAGNOSIS — E785 Hyperlipidemia, unspecified: Secondary | ICD-10-CM | POA: Diagnosis not present

## 2017-07-31 DIAGNOSIS — Z8639 Personal history of other endocrine, nutritional and metabolic disease: Secondary | ICD-10-CM | POA: Insufficient documentation

## 2017-07-31 DIAGNOSIS — M8580 Other specified disorders of bone density and structure, unspecified site: Secondary | ICD-10-CM

## 2017-07-31 DIAGNOSIS — F430 Acute stress reaction: Secondary | ICD-10-CM | POA: Diagnosis not present

## 2017-07-31 DIAGNOSIS — I700 Atherosclerosis of aorta: Secondary | ICD-10-CM

## 2017-07-31 DIAGNOSIS — R739 Hyperglycemia, unspecified: Secondary | ICD-10-CM | POA: Diagnosis not present

## 2017-07-31 DIAGNOSIS — Z0000 Encounter for general adult medical examination without abnormal findings: Secondary | ICD-10-CM

## 2017-07-31 DIAGNOSIS — C8331 Diffuse large B-cell lymphoma, lymph nodes of head, face, and neck: Secondary | ICD-10-CM

## 2017-07-31 MED ORDER — ALENDRONATE SODIUM 70 MG PO TABS: ORAL_TABLET | ORAL | 3 refills | Status: DC

## 2017-07-31 MED ORDER — BUSPIRONE HCL 15 MG PO TABS: 7.5000 mg | ORAL_TABLET | Freq: Two times a day (BID) | ORAL | 3 refills | Status: DC

## 2017-07-31 MED ORDER — OMEPRAZOLE 20 MG PO CPDR
DELAYED_RELEASE_CAPSULE | ORAL | 3 refills | Status: DC
Start: 2017-07-31 — End: 2018-07-21

## 2017-07-31 NOTE — Assessment & Plan Note (Signed)
Lab Results  Component Value Date   HGBA1C 5.6 07/27/2017   Improved disc imp of low glycemic diet and wt loss to prevent DM2

## 2017-07-31 NOTE — Assessment & Plan Note (Signed)
Doing well with buspar

## 2017-07-31 NOTE — Progress Notes (Signed)
Subjective:    Patient ID: Tara Sloan, female    DOB: Mar 04, 1946, 71 y.o.   MRN: [id number]  HPI Here for health maintenance exam and to review chronic medical problems    Feeling pretty good except for arthritis/ joint stiffness   Wt Readings from Last 3 Encounters:  07/31/17 134 lb 8 oz (61 kg)  07/27/17 133 lb 8 oz (60.6 kg)  06/29/17 136 lb (61.7 kg)  stable / well controlled weight  23.83 kg/m   Had amw on 8/6 Failed hearing test = L ear = she does not notice it/not bothersome   dexa 8/17  Hip score improved/osteopenia  On fosamax and D and ca No fractures  Vit D level is 63   Mammogram 9/17 nl - does not have that set up yet  Self breast exam--no lumps   Colonoscopy 9/16 -10 y recall   zostavax 10/13  Hyperlipidemia Lab Results  Component Value Date   CHOL 127 07/27/2017   CHOL 186 06/30/2016   CHOL 131 06/22/2015   Lab Results  Component Value Date   HDL 32 (L) 07/27/2017   HDL 36.60 (L) 06/30/2016   HDL 36.60 (L) 06/22/2015   Lab Results  Component Value Date   LDLCALC 67 07/27/2017   LDLCALC 114 (H) 06/30/2016   LDLCALC 74 06/22/2015   Lab Results  Component Value Date   TRIG 142 07/27/2017   TRIG 176.0 (H) 06/30/2016   TRIG 101.0 06/22/2015   Lab Results  Component Value Date   CHOLHDL 4.0 07/27/2017   CHOLHDL 5 06/30/2016   CHOLHDL 4 06/22/2015   Lab Results  Component Value Date   LDLDIRECT 92.2 03/02/2012   On atorvastatin and diet  HDL is down  Otherwise improved  [facility]    Hyperglycemia Lab Results  Component Value Date   HGBA1C 5.6 07/27/2017   Down from 6 She is improving her diet   Lab Results  Component Value Date   WBC 7.5 03/10/2017   HGB 13.2 03/10/2017   HCT 39.3 03/10/2017   MCV 89.4 03/10/2017   PLT 189 03/10/2017     Chemistry      Component Value Date/Time   NA 141 07/27/2017 1024   NA 144 03/10/2017 1025   K 4.4 07/27/2017 1024   K 4.1 03/10/2017 1025   CL 106  07/27/2017 1024   CL 105 03/08/2013 0851   CO2 23 07/27/2017 1024   CO2 27 03/10/2017 1025   BUN 21 07/27/2017 1024   BUN 28.3 (H) 03/10/2017 1025   CREATININE 0.96 (H) 07/27/2017 1024   CREATININE 0.9 03/10/2017 1025      Component Value Date/Time   CALCIUM 9.1 07/27/2017 1024   CALCIUM 9.4 03/10/2017 1025   ALKPHOS 50 07/27/2017 1024   ALKPHOS 75 03/10/2017 1025   AST 19 07/27/2017 1024   AST 20 03/10/2017 1025   ALT 12 07/27/2017 1024   ALT 17 03/10/2017 1025   BILITOT 0.7 07/27/2017 1024   BILITOT 0.43 03/10/2017 1025      Lab Results  Component Value Date   TSH 2.02 07/27/2017     Patient Active Problem List   Diagnosis Date Noted  . Stress reaction 08/04/2016  . Estrogen deficiency 07/04/2016  . Aortic atherosclerosis ([place]) 07/06/2015  . Colon cancer screening 06/29/2015  . Routine general medical examination at a health care facility 06/21/2015  . Chest pain 03/11/2015  . Left bundle branch block (LBBB) 07/06/2014  .  Encounter for Medicare annual wellness exam 03/17/2013  . 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 Pain in joint, ankle and foot 03/16/2013  . Hyperglycemia 12/01/2011  . History of B-cell lymphoma 11/22/2011  . ARTHRITIS, HANDS, BILATERAL 12/12/2010  . Dyslipidemia 04/02/2008  . ALLERGIC RHINITIS 04/02/2008  . GERD 04/02/2008  . Osteopenia 09/13/2007  . ALLERGY 08/06/2007   Past Medical History:  Diagnosis Date  . Allergic rhinitis   . Arthritis    "hands; probably qwhere" (03/12/2015)  . Basal cell carcinoma ~ 2010   "leg"  . Chronic back pain   . 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 External hemorrhoid   . GERD (gastroesophageal reflux disease)   . History of radiation therapy 03/22/12 - 04/09/12   right oropharynx/neck  . HLD (hyperlipidemia)   . LBBB (left bundle branch block) dx'd 05/2014  . Non Hodgkin's lymphoma ([place])   . Osteopenia    Past Surgical History:  Procedure Laterality Date  .  ABDOMINAL HYSTERECTOMY  1988   "partial; for fibroids"  . BASAL CELL CARCINOMA EXCISION Right ~ 2010   "leg"  . BREAST BIOPSY Left 12/03   Negative  . COLONOSCOPY  5/06   Ext. hemms  . DILATION AND CURETTAGE OF UTERUS  1987  . TONSILLECTOMY  11/07/2011   Procedure: TONSILLECTOMY;  Surgeon: Rozetta Nunnery, MD;  [facility];  Service: ENT;  Laterality: N/A;  . TONSILLECTOMY  10/2011   "right was cancerous"  . TUBAL LIGATION  1987   Social History  Substance Use Topics  . Smoking status: Former Smoker    Packs/day: 0.25    Years: 10.00    Types: Cigarettes    Quit date: 11/04/1974  . Smokeless tobacco: Never Used  . Alcohol use 0.0 oz/week     Comment: rare   Family History  Problem Relation Age of Onset  . Heart attack Father 24  . Heart disease Father   . Stroke Mother 13  . Breast cancer Paternal Aunt   . Cancer Paternal Aunt   . Cancer Paternal Aunt   . Hyperlipidemia Unknown        Family history  . Colon cancer Neg Hx   . Colon polyps Neg Hx    Allergies  Allergen Reactions  . Pnu-Imune [Pneumococcal Polysaccharide Vaccine] Hives   Current Outpatient Prescriptions on File Prior to Visit  Medication Sig Dispense Refill  . acetaminophen (TYLENOL) 500 MG tablet Take 1,000 mg by mouth every 8 (eight) hours as needed for mild pain or headache.    Marland Kitchen aspirin 81 MG tablet Take 81 mg by mouth every evening.     Marland Kitchen atorvastatin (LIPITOR) 80 MG tablet TAKE 1 TABLET (80 MG TOTAL) BY MOUTH DAILY. 90 tablet 2  . Calcium-Vitamin D-Vitamin K 500-100-40 MG-UNT-MCG CHEW Chew 2 tablets by mouth daily.     . Cholecalciferol (VITAMIN D-3) 1000 UNITS CAPS Take 2,000 Units by mouth daily.     . meloxicam (MOBIC) 7.5 MG tablet Take 1 tablet (7.5 mg total) by mouth daily. (Patient taking differently: Take 7.5 mg by mouth daily as needed. ) 90 tablet 1  . Multiple Vitamin (MULTIVITAMIN) tablet Take 1 tablet by mouth daily.      . [DISCONTINUED]  prochlorperazine (COMPAZINE) 10 MG tablet Take 1 tablet (10 mg total) by mouth every 6 (six) hours as needed (Nausea or vomiting). 30 tablet 6   No current facility-administered medications on file prior  to visit.       Review of Systems  Constitutional: Negative for activity change, appetite change, fatigue, fever and unexpected weight change.  HENT: Negative for congestion, ear pain, rhinorrhea, sinus pressure and sore throat.   Eyes: Negative for pain, redness and visual disturbance.  Respiratory: Negative for cough, shortness of breath and wheezing.   Cardiovascular: Negative for chest pain and palpitations.  Gastrointestinal: Negative for abdominal pain, blood in stool, constipation and diarrhea.  Endocrine: Negative for polydipsia and polyuria.  Genitourinary: Negative for dysuria, frequency and urgency.  Musculoskeletal: Positive for arthralgias. Negative for back pain and myalgias.  Skin: Negative for pallor and rash.  Allergic/Immunologic: Negative for environmental allergies.  Neurological: Negative for dizziness, syncope and headaches.  Hematological: Negative for adenopathy. Does not bruise/bleed easily.  Psychiatric/Behavioral: Negative for decreased concentration and dysphoric mood. The patient is not nervous/anxious.        Objective:   Physical Exam  Constitutional: She appears well-developed and well-nourished. No distress.  Well appearing   HENT:  Head: Normocephalic and atraumatic.  Right Ear: External ear normal.  Left Ear: External ear normal.  Mouth/Throat: Oropharynx is clear and moist.  Eyes: Pupils are equal, round, and reactive to light. Conjunctivae and EOM are normal. No scleral icterus.  Neck: Normal range of motion. Neck supple. No JVD present. Carotid bruit is not present. No thyromegaly present.  Cardiovascular: Normal rate, regular rhythm, normal heart sounds and intact distal pulses.  Exam reveals no gallop.   Pulmonary/Chest: Effort normal and  breath sounds normal. No respiratory distress. She has no wheezes. She exhibits no tenderness.  Abdominal: Soft. Bowel sounds are normal. She exhibits no distension, no abdominal bruit and no mass. There is no tenderness.  Genitourinary: No breast swelling, tenderness, discharge or bleeding.  Genitourinary Comments: Breast exam: No mass, nodules, thickening, tenderness, bulging, retraction, inflamation, nipple discharge or skin changes noted.  No axillary or clavicular LA.      Musculoskeletal: Normal range of motion. She exhibits no edema or tenderness.  Lymphadenopathy:    She has no cervical adenopathy.  Neurological: She is alert. She has normal reflexes. No cranial nerve deficit. She exhibits normal muscle tone. Coordination normal.  Skin: Skin is warm and dry. No rash noted. No erythema. No pallor.  Few lentigines and some sks on trunk   Psychiatric: She has a normal mood and affect.          Assessment & Plan:   Problem List Items Addressed This Visit      Cardiovascular and Mediastinum   Aortic atherosclerosis ([place])    Monitored by cardiology  Cholesterol is controlled        Musculoskeletal and Integument   Osteopenia - Primary    Improved dexa 8/17 with fosamax and ca and D No falls or fractures  Disc need for calcium/ vitamin D/ wt bearing exercise and bone density test every 2 y to monitor Disc safety/ fracture risk in detail          Other   Dyslipidemia    Improved with atorvastatin 80 mg  Disc goals for lipids and reasons to control them Rev labs with pt Rev low sat fat diet in detail        Hyperglycemia    Lab Results  Component Value Date   HGBA1C 5.6 07/27/2017   Improved disc imp of low glycemic diet and wt loss to prevent DM2       Routine general medical examination at a health  care facility    Reviewed health habits including diet and exercise and skin cancer prevention Reviewed appropriate screening tests for age  Also reviewed health  mt list, fam hx and immunization status , as well as social and family history   See HPI Labs reviewed amw reviewed Doing well overall She will schedule her own mammogram      Stress reaction    Doing well with buspar

## 2017-07-31 NOTE — Assessment & Plan Note (Signed)
Reviewed health habits including diet and exercise and skin cancer prevention Reviewed appropriate screening tests for age  Also reviewed health mt list, fam hx and immunization status , as well as social and family history   See HPI Labs reviewed amw reviewed Doing well overall She will schedule her own mammogram

## 2017-07-31 NOTE — Assessment & Plan Note (Signed)
Improved dexa 8/17 with fosamax and ca and D No falls or fractures  Disc need for calcium/ vitamin D/ wt bearing exercise and bone density test every 2 y to monitor Disc safety/ fracture risk in detail

## 2017-07-31 NOTE — Assessment & Plan Note (Signed)
Improved with atorvastatin 80 mg  Disc goals for lipids and reasons to control them Rev labs with pt Rev low sat fat diet in detail

## 2017-07-31 NOTE — Assessment & Plan Note (Signed)
Monitored by cardiology  Cholesterol is controlled

## 2017-07-31 NOTE — Patient Instructions (Addendum)
Don't forget to schedule your mammogram for sept   Take care of yourself  Keep eating a healthy diet and exercising   Labs look good

## 2017-08-02 DIAGNOSIS — C8331 Diffuse large B-cell lymphoma, lymph nodes of head, face, and neck: Secondary | ICD-10-CM | POA: Insufficient documentation

## 2017-08-02 NOTE — Assessment & Plan Note (Addendum)
Continues to do well after treatment  Rev recent oncol note - she is a long term survivor and they have signed off

## 2017-08-20 ENCOUNTER — Encounter: Payer: Self-pay | Admitting: Family Medicine

## 2017-08-20 ENCOUNTER — Ambulatory Visit: Payer: Self-pay | Admitting: Family Medicine

## 2017-08-20 ENCOUNTER — Ambulatory Visit (INDEPENDENT_AMBULATORY_CARE_PROVIDER_SITE_OTHER): Payer: Medicare Other | Admitting: Family Medicine

## 2017-08-20 VITALS — BP 92/60 | HR 88 | Temp 98.2°F | Ht 63.0 in | Wt 136.2 lb

## 2017-08-20 DIAGNOSIS — M1711 Unilateral primary osteoarthritis, right knee: Secondary | ICD-10-CM

## 2017-08-20 MED ORDER — METHYLPREDNISOLONE ACETATE 40 MG/ML IJ SUSP
80.0000 mg | Freq: Once | INTRAMUSCULAR | Status: AC
  Administered 2017-08-20: 80 mg via INTRA_ARTICULAR

## 2017-08-20 NOTE — Progress Notes (Signed)
Dr. Frederico Hamman T. Damara Klunder, MD, [facility] [name], [address] Phone: [phone] Fax: [phone]  08/20/2017  Patient: Tara Sloan, MRN: [id number], DOB: 17-Oct-1946, 71 y.o.  Primary Physician:  Tower, Wynelle Fanny, MD   Chief Complaint  Patient presents with  . Knee Pain    Right   Subjective:   Tara Sloan is a 71 y.o. very pleasant female patient who presents with the following:  R knee pain / OA: Pleasant patient with known osteoarthritis of the right knee presents with some recurrence of pain in the last few weeks in her knee. She has not done anything specific that she can think of to flared up. She has not had any kind of accident or injury. We did do a intra-articular injection in her right knee and April of this year with good results.  R knee inj  Past Medical History, Surgical History, Social History, Family History, Problem List, Medications, and Allergies have been reviewed and updated if relevant.  Patient Active Problem List   Diagnosis Date Noted  . History of vitamin D deficiency 07/31/2017  . Stress reaction 08/04/2016  . Estrogen deficiency 07/04/2016  . Aortic atherosclerosis ([place]) 07/06/2015  . Colon cancer screening 06/29/2015  . Routine general medical examination at a health care facility 06/21/2015  . Chest pain 03/11/2015  . Left bundle branch block (LBBB) 07/06/2014  . Encounter for Medicare annual wellness exam 03/17/2013  . 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 Pain in joint, ankle and foot 03/16/2013  . Hyperglycemia 12/01/2011  . History of B-cell lymphoma 11/22/2011  . ARTHRITIS, HANDS, BILATERAL 12/12/2010  . Dyslipidemia 04/02/2008  . ALLERGIC RHINITIS 04/02/2008  . GERD 04/02/2008  . Osteopenia 09/13/2007  . ALLERGY 08/06/2007    Past Medical History:  Diagnosis Date  . Allergic rhinitis   . Arthritis    "hands; probably qwhere" (03/12/2015)  . Basal cell  carcinoma ~ 2010   "leg"  . Chronic back pain   . 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 External hemorrhoid   . GERD (gastroesophageal reflux disease)   . History of radiation therapy 03/22/12 - 04/09/12   right oropharynx/neck  . HLD (hyperlipidemia)   . LBBB (left bundle branch block) dx'd 05/2014  . Non Hodgkin's lymphoma ([place])   . Osteopenia     Past Surgical History:  Procedure Laterality Date  . ABDOMINAL HYSTERECTOMY  1988   "partial; for fibroids"  . BASAL CELL CARCINOMA EXCISION Right ~ 2010   "leg"  . BREAST BIOPSY Left 12/03   Negative  . COLONOSCOPY  5/06   Ext. hemms  . DILATION AND CURETTAGE OF UTERUS  1987  . TONSILLECTOMY  11/07/2011   Procedure: TONSILLECTOMY;  Surgeon: Rozetta Nunnery, MD;  [facility];  Service: ENT;  Laterality: N/A;  . TONSILLECTOMY  10/2011   "right was cancerous"  . TUBAL LIGATION  1987    Social History   Social History  . Marital status: Married    Spouse name: N/A  . Number of children: 2  . Years of education: N/A   Occupational History  . Employed     [employer], Glass blower/designer   Social History Main Topics  . Smoking status: Former Smoker    Packs/day: 0.25    Years: 10.00    Types: Cigarettes    Quit date: 11/04/1974  .  Smokeless tobacco: Never Used  . Alcohol use 0.0 oz/week     Comment: rare  . Drug use: No  . Sexual activity: No   Other Topics Concern  . Not on file   Social History Narrative   Married (husband with a lot of health problems and CAD)      2 children      Exercise-going to [employer]      Employed-plans to retire at 48    Family History  Problem Relation Age of Onset  . Heart attack Father 41  . Heart disease Father   . Stroke Mother 59  . Breast cancer Paternal Aunt   . Cancer Paternal Aunt   . Cancer Paternal Aunt   . Hyperlipidemia Unknown        Family history  . Colon cancer Neg Hx   . Colon polyps Neg Hx      Allergies  Allergen Reactions  . Pnu-Imune [Pneumococcal Polysaccharide Vaccine] Hives    Medication list reviewed and updated in full in [place].  GEN: No fevers, chills. Nontoxic. Primarily MSK c/o today. MSK: Detailed in the HPI GI: tolerating PO intake without difficulty Neuro: No numbness, parasthesias, or tingling associated. Otherwise the pertinent positives of the ROS are noted above.   Objective:   BP 92/60   Pulse 88   Temp 98.2 F (36.8 C) (Oral)   Ht 5\' 3"  (1.6 m)   Wt 136 lb 4 oz (61.8 kg)   BMI 24.14 kg/m    GEN: WDWN, NAD, Non-toxic, Alert & Oriented x 3 HEENT: Atraumatic, Normocephalic.  Ears and Nose: No external deformity. EXTR: No clubbing/cyanosis/edema NEURO: Normal gait.  PSYCH: Normally interactive. Conversant. Not depressed or anxious appearing.  Calm demeanor.   Knee:  R Gait: Normal heel toe pattern ROM: 0-125 Effusion: neg Echymosis or edema: none Patellar tendon NT Painful PLICA: neg Patellar grind: negative Medial and lateral patellar facet loading: negative medial and lateral joint lines: medial joint line pain Mcmurray's neg Flexion-pinch neg Varus and valgus stress: stable Lachman: neg Ant and Post drawer: neg Hip abduction, IR, ER: WNL Hip flexion str: 5/5 Hip abd: 5/5 Quad: 5/5 VMO atrophy:No Hamstring concentric and eccentric: 5/5   Radiology: No results found.  Assessment and Plan:   Primary osteoarthritis of right knee - Plan: methylPREDNISolone acetate (DEPO-MEDROL) injection 80 mg  Fairly functional at baseline, able to play with her son's dog, walk, and otherwise be active. Flare up of her right knee osteoarthritis.  Intra-articular corticosteroid injection. Continue ice, Tylenol, NSAIDs as needed.  Knee Injection, R Patient verbally consented to procedure. Risks (including potential rare risk of infection), benefits, and alternatives explained. Sterilely prepped with Chloraprep. Ethyl cholride  used for anesthesia. 8 cc Lidocaine 1% mixed with 2 mL Depo-Medrol 40 mg injected using the anteromedial approach without difficulty. No complications with procedure and tolerated well. Patient had decreased pain post-injection.   Follow-up: No Follow-up on file.  Meds ordered this encounter  Medications  . methylPREDNISolone acetate (DEPO-MEDROL) injection 80 mg   Signed,  Airon Sahni T. Reshanda Lewey, MD   Patient's Medications  New Prescriptions   No medications on file  Previous Medications   ACETAMINOPHEN (TYLENOL) 500 MG TABLET    Take 1,000 mg by mouth every 8 (eight) hours as needed for mild pain or headache.   ALENDRONATE (FOSAMAX) 70 MG TABLET    TAKE 1 TABLET BY MOUTH EVERY 7 DAYS ON AN EMPTY STOMACH WITH A FULL GLASS OF WATER  ASPIRIN 81 MG TABLET    Take 81 mg by mouth every evening.    ATORVASTATIN (LIPITOR) 80 MG TABLET    TAKE 1 TABLET (80 MG TOTAL) BY MOUTH DAILY.   BUSPIRONE (BUSPAR) 15 MG TABLET    Take 0.5 tablets (7.5 mg total) by mouth 2 (two) times daily.   CALCIUM-VITAMIN D-VITAMIN K 500-100-40 MG-UNT-MCG CHEW    Chew 2 tablets by mouth daily.    CHOLECALCIFEROL (VITAMIN D-3) 1000 UNITS CAPS    Take 2,000 Units by mouth daily.    MELOXICAM (MOBIC) 7.5 MG TABLET    Take 1 tablet (7.5 mg total) by mouth daily.   MULTIPLE VITAMIN (MULTIVITAMIN) TABLET    Take 1 tablet by mouth daily.     OMEPRAZOLE (PRILOSEC) 20 MG CAPSULE    TAKE 1 CAPSULE BY MOUTH DAILY AS NEEDED FOR HEARTBURN  Modified Medications   No medications on file  Discontinued Medications   No medications on file

## 2017-08-27 ENCOUNTER — Other Ambulatory Visit: Payer: Self-pay | Admitting: Family Medicine

## 2017-08-27 DIAGNOSIS — Z1231 Encounter for screening mammogram for malignant neoplasm of breast: Secondary | ICD-10-CM

## 2017-09-18 ENCOUNTER — Ambulatory Visit
Admission: RE | Admit: 2017-09-18 | Discharge: 2017-09-18 | Disposition: A | Discharge status: Home / Self Care | Payer: Medicare Other | Source: Ambulatory Visit | Attending: Family Medicine | Admitting: Family Medicine

## 2017-09-18 DIAGNOSIS — Z1231 Encounter for screening mammogram for malignant neoplasm of breast: Secondary | ICD-10-CM

## 2017-10-08 ENCOUNTER — Ambulatory Visit: Payer: Medicare Other

## 2017-10-15 ENCOUNTER — Ambulatory Visit (INDEPENDENT_AMBULATORY_CARE_PROVIDER_SITE_OTHER): Payer: Medicare Other

## 2017-10-15 DIAGNOSIS — Z23 Encounter for immunization: Secondary | ICD-10-CM | POA: Diagnosis not present

## 2017-10-27 ENCOUNTER — Other Ambulatory Visit: Payer: Self-pay | Admitting: Family Medicine

## 2017-11-11 ENCOUNTER — Other Ambulatory Visit: Payer: Self-pay | Admitting: *Deleted

## 2017-11-11 MED ORDER — BUSPIRONE HCL 7.5 MG PO TABS: 7.5000 mg | ORAL_TABLET | Freq: Two times a day (BID) | ORAL | 1 refills | Status: DC

## 2017-11-11 NOTE — Telephone Encounter (Signed)
15 mg of buspirone is on back order, sent in 7.5 instead

## 2017-12-11 ENCOUNTER — Other Ambulatory Visit: Payer: Self-pay | Admitting: Family Medicine

## 2017-12-11 NOTE — Telephone Encounter (Signed)
Copied from [place]. Topic: Quick Communication - See Telephone Encounter >> Dec 11, 2017  8:23 AM Aurelio Brash B wrote: CRM for notification. See Telephone encounter for:  Refill busirone [facility] 12/11/17.  PT is out of med

## 2017-12-11 NOTE — Telephone Encounter (Signed)
Pt should have refills at [place]. Pt will call pharmacy and cb if needed.

## 2017-12-11 NOTE — Telephone Encounter (Signed)
Refill request for Busprione (Buspar) / LOV 07/31/17 with Dr. Glori Bickers /

## 2017-12-17 ENCOUNTER — Ambulatory Visit: Payer: Medicare Other | Admitting: Family Medicine

## 2017-12-17 ENCOUNTER — Encounter: Payer: Self-pay | Admitting: Family Medicine

## 2017-12-17 ENCOUNTER — Other Ambulatory Visit: Payer: Self-pay

## 2017-12-17 VITALS — BP 130/70 | HR 84 | Temp 98.1°F | Ht 63.0 in | Wt 134.8 lb

## 2017-12-17 DIAGNOSIS — M1711 Unilateral primary osteoarthritis, right knee: Secondary | ICD-10-CM | POA: Diagnosis not present

## 2017-12-17 MED ORDER — METHYLPREDNISOLONE ACETATE 40 MG/ML IJ SUSP
80.0000 mg | Freq: Once | INTRAMUSCULAR | Status: AC
Start: 2017-12-17 — End: 2017-12-17
  Administered 2017-12-17: 80 mg via INTRA_ARTICULAR

## 2017-12-17 NOTE — Progress Notes (Signed)
Dr. Frederico Hamman T. Pranika Finks, MD, [facility] [name], [address] Phone: [phone] Fax: [phone]  12/17/2017  Patient: Tara Sloan, MRN: [id number], DOB: 1946-09-10, 71 y.o.  Primary Physician:  Tower, Wynelle Fanny, MD   Chief Complaint  Patient presents with  . Knee Pain    Right-Wants Injection   Subjective:   Tara Sloan is a 72 y.o. very pleasant female patient who presents with the following:  R knee pain: Pleasant patient who I remember well who is been having some intermittent knee pain for greater than a year, and previously injected her knee with corticosteroid with a positive response.  She is having some anterior as well as medial knee pain currently.  No recent injury.  Past Medical History, Surgical History, Social History, Family History, Problem List, Medications, and Allergies have been reviewed and updated if relevant.  Patient Active Problem List   Diagnosis Date Noted  . History of vitamin D deficiency 07/31/2017  . Stress reaction 08/04/2016  . Estrogen deficiency 07/04/2016  . Aortic atherosclerosis ([place]) 07/06/2015  . Colon cancer screening 06/29/2015  . Routine general medical examination at a health care facility 06/21/2015  . Chest pain 03/11/2015  . Left bundle branch block (LBBB) 07/06/2014  . Encounter for Medicare annual wellness exam 03/17/2013  . 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 Pain in joint, ankle and foot 03/16/2013  . Hyperglycemia 12/01/2011  . History of B-cell lymphoma 11/22/2011  . ARTHRITIS, HANDS, BILATERAL 12/12/2010  . Dyslipidemia 04/02/2008  . ALLERGIC RHINITIS 04/02/2008  . GERD 04/02/2008  . Osteopenia 09/13/2007  . ALLERGY 08/06/2007    Past Medical History:  Diagnosis Date  . Allergic rhinitis   . Arthritis    "hands; probably qwhere" (03/12/2015)  . Basal cell carcinoma ~ 2010   "leg"  . Chronic back pain   . 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 External hemorrhoid   . GERD (gastroesophageal reflux disease)   . History of radiation therapy 03/22/12 - 04/09/12   right oropharynx/neck  . HLD (hyperlipidemia)   . LBBB (left bundle branch block) dx'd 05/2014  . Non Hodgkin's lymphoma ([place])   . Osteopenia     Past Surgical History:  Procedure Laterality Date  . ABDOMINAL HYSTERECTOMY  1988   "partial; for fibroids"  . BASAL CELL CARCINOMA EXCISION Right ~ 2010   "leg"  . BREAST BIOPSY Left 12/03   Negative  . COLONOSCOPY  5/06   Ext. hemms  . DILATION AND CURETTAGE OF UTERUS  1987  . TONSILLECTOMY  11/07/2011   Procedure: TONSILLECTOMY;  Surgeon: Rozetta Nunnery, MD;  Location: [facility];  Service: ENT;  Laterality: N/A;  . TONSILLECTOMY  10/2011   "right was cancerous"  . TUBAL LIGATION  1987    Social History   Socioeconomic History  . Marital status: Married    Spouse name: Not on file  . Number of children: 2  . Years of education: Not on file  . Highest education level: Not on file  Social Needs  . Financial resource strain: Not on file  . Food insecurity - worry: Not on file  . Food insecurity - inability: Not on file  . Transportation needs - medical: Not on file  . Transportation needs - non-medical: Not on file  Occupational History  . 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 Smoking status: Former Smoker    Packs/day: 0.25    Years: 10.00    Pack years: 2.50    Types: Cigarettes    Last attempt to quit: 11/04/1974    Years since quitting: 43.1  . Smokeless tobacco: Never Used  Substance and Sexual Activity  . Alcohol use: Yes    Alcohol/week: 0.0 oz    Comment: rare  . Drug use: No  . Sexual activity: No    Birth control/protection: None  Other Topics Concern  . Not on file  Social History Narrative   Married (husband with a lot of health problems and CAD)      2 children       Exercise-going to [employer]      Employed-plans to retire at 58    Family History  Problem Relation Age of Onset  . Heart attack Father 15  . Heart disease Father   . Stroke Mother 85  . Breast cancer Paternal Aunt   . Cancer Paternal Aunt   . Cancer Paternal Aunt   . Hyperlipidemia Unknown        Family history  . Colon cancer Neg Hx   . Colon polyps Neg Hx     Allergies  Allergen Reactions  . Pnu-Imune [Pneumococcal Polysaccharide Vaccine] Hives    Medication list reviewed and updated in full in [facility].  GEN: No fevers, chills. Nontoxic. Primarily MSK c/o today. MSK: Detailed in the HPI GI: tolerating PO intake without difficulty Neuro: No numbness, parasthesias, or tingling associated. Otherwise the pertinent positives of the ROS are noted above.   Objective:   BP 130/70   Pulse 84   Temp 98.1 F (36.7 C) (Oral)   Ht 5\' 3"  (1.6 m)   Wt 134 lb 12 oz (61.1 kg)   BMI 23.87 kg/m    GEN: WDWN, NAD, Non-toxic, Alert & Oriented x 3 HEENT: Atraumatic, Normocephalic.  Ears and Nose: No external deformity. EXTR: No clubbing/cyanosis/edema NEURO: Normal gait.  PSYCH: Normally interactive. Conversant. Not depressed or anxious appearing.  Calm demeanor.    Right knee with full extension and flexion to 120 degrees.  There is some mild tenderness at the medial joint line compared to the lateral joint line.  There is tenderness and crepitus with patellar compression and a positive grind test.  Stable MCL and LCL.  ACL and PCL are stable.  There is some tenderness with McMurray's test as well as flexion pinch testing.  Radiology: No results found.  Assessment and Plan:   Primary osteoarthritis of right knee - Plan: methylPREDNISolone acetate (DEPO-MEDROL) injection 80 mg  She continues to be fairly active and is not all that limited by her knees, and is recently been down visiting with her family members for Christmas.  Overall she is doing well.  Knee  Injection, R Patient verbally consented to procedure. Risks (including potential rare risk of infection), benefits, and alternatives explained. Sterilely prepped with Chloraprep. Ethyl cholride used for anesthesia. 8 cc Lidocaine 1% mixed with 2 mL Depo-Medrol 40 mg injected using the anteromedial approach without difficulty. No complications with procedure and tolerated well. Patient had decreased pain post-injection.   Follow-up: No Follow-up on file.  Meds ordered this encounter  Medications  . methylPREDNISolone acetate (DEPO-MEDROL) injection 80 mg   Signed,  Elaine Roanhorse T. Blade Scheff, MD   Allergies as of 12/17/2017      Reactions   Pnu-imune [pneumococcal Polysaccharide Vaccine] Hives  Medication List        Accurate as of 12/17/17  9:25 AM. Always use your most recent med list.          acetaminophen 500 MG tablet Commonly known as:  TYLENOL Take 1,000 mg by mouth every 8 (eight) hours as needed for mild pain or headache.   alendronate 70 MG tablet Commonly known as:  FOSAMAX TAKE 1 TABLET BY MOUTH EVERY 7 DAYS ON AN EMPTY STOMACH WITH A FULL GLASS OF WATER   aspirin 81 MG tablet Take 81 mg by mouth every evening.   atorvastatin 80 MG tablet Commonly known as:  LIPITOR TAKE 1 TABLET (80 MG TOTAL) BY MOUTH DAILY.   busPIRone 7.5 MG tablet Commonly known as:  BUSPAR Take 1 tablet (7.5 mg total) by mouth 2 (two) times daily.   Calcium-Vitamin D-Vitamin K 500-100-40 MG-UNT-MCG Chew Chew 2 tablets by mouth daily.   meloxicam 7.5 MG tablet Commonly known as:  MOBIC Take 1 tablet (7.5 mg total) by mouth daily.   multivitamin tablet Take 1 tablet by mouth daily.   omeprazole 20 MG capsule Commonly known as:  PRILOSEC TAKE 1 CAPSULE BY MOUTH DAILY AS NEEDED FOR HEARTBURN   Vitamin D-3 1000 units Caps Take 2,000 Units by mouth daily.

## 2018-01-08 ENCOUNTER — Other Ambulatory Visit: Payer: Self-pay | Admitting: Family Medicine

## 2018-01-08 MED ORDER — BUSPIRONE HCL 15 MG PO TABS: 7.5000 mg | ORAL_TABLET | Freq: Two times a day (BID) | ORAL | 3 refills | Status: DC

## 2018-01-08 NOTE — Telephone Encounter (Signed)
I sent it to [facility] on [address] to take 1/2 of 15 mg bid

## 2018-01-08 NOTE — Telephone Encounter (Signed)
Pt notified Rx was sent to [facility]

## 2018-01-08 NOTE — Telephone Encounter (Signed)
NO REFILLS Buspirone HCL 7.5 mg one tablet two times daily. Last does two days ago. [facility] says [facility]. Pt concerned the brand no longer available. Please call pt some in as she has been without for two days. [facility] if [facility] not [facility] it then she will go to [facility] in [place] [address] area. Pt ph [phone].

## 2018-01-08 NOTE — Telephone Encounter (Signed)
See prev note, I checked with [facility] at [facility] and the 7.5mg  is still on back order, the only strength they have left is 30mg , called [facility]. and they have 5mg , 10mg , 15mg , and 30 mg doses still available, please advise

## 2018-01-08 NOTE — Telephone Encounter (Signed)
If she is able to go to [facility] on [address]- I wrote for the 15 mg so just take 1/2 pill bid   If she is ok with this please call it in and send to [facility] Thanks

## 2018-01-20 ENCOUNTER — Ambulatory Visit: Payer: Medicare Other | Admitting: Family Medicine

## 2018-01-25 ENCOUNTER — Encounter: Payer: Self-pay | Admitting: Family Medicine

## 2018-01-25 ENCOUNTER — Ambulatory Visit: Payer: Medicare Other | Admitting: Family Medicine

## 2018-01-25 VITALS — BP 122/70 | HR 86 | Temp 98.4°F | Ht 63.0 in | Wt 133.5 lb

## 2018-01-25 DIAGNOSIS — R413 Other amnesia: Secondary | ICD-10-CM

## 2018-01-25 DIAGNOSIS — G3184 Mild cognitive impairment, so stated: Secondary | ICD-10-CM | POA: Insufficient documentation

## 2018-01-25 DIAGNOSIS — J069 Acute upper respiratory infection, unspecified: Secondary | ICD-10-CM

## 2018-01-25 DIAGNOSIS — B9789 Other viral agents as the cause of diseases classified elsewhere: Secondary | ICD-10-CM | POA: Diagnosis not present

## 2018-01-25 LAB — COMPREHENSIVE METABOLIC PANEL
ALT: 13 U/L (ref 0–35)
AST: 16 U/L (ref 0–37)
Albumin: 3.9 g/dL (ref 3.5–5.2)
Alkaline Phosphatase: 60 U/L (ref 39–117)
BUN: 20 mg/dL (ref 6–23)
CO2: 28 mEq/L (ref 19–32)
Calcium: 9.8 mg/dL (ref 8.4–10.5)
Chloride: 104 mEq/L (ref 96–112)
Creatinine, Ser: 0.93 mg/dL (ref 0.40–1.20)
GFR: 62.95 mL/min (ref >60.00)
Glucose, Bld: 110 mg/dL — ABNORMAL HIGH (ref 70–99)
Potassium: 3.5 mEq/L (ref 3.5–5.1)
Sodium: 141 mEq/L (ref 135–145)
Total Bilirubin: 0.5 mg/dL (ref 0.2–1.2)
Total Protein: 6.6 g/dL (ref 6.0–8.3)

## 2018-01-25 LAB — VITAMIN B12: Vitamin B-12: >1500 pg/mL — ABNORMAL HIGH (ref 211–911)

## 2018-01-25 LAB — TSH: TSH: 1.86 u[IU]/mL (ref 0.35–4.50)

## 2018-01-25 MED ORDER — BENZONATATE 200 MG PO CAPS: 200.0000 mg | ORAL_CAPSULE | Freq: Three times a day (TID) | ORAL | 1 refills | Status: DC | PRN

## 2018-01-25 NOTE — Assessment & Plan Note (Signed)
This may be age related and multifactorial Short term only  ? If slowing cognition 27.5/30 MMS score with nl clock draw  Lab today (tsh/B12/cmet) Disc stress rxn -consider counseling Disc health habits/sleep/diet Disc socialization  Helpful son here with her today  Fu approx 2 wk Info given on aricept to consider-disc at f/u  Could also consider neuro ref

## 2018-01-25 NOTE — Progress Notes (Signed)
Subjective:    Patient ID: Tara Sloan, female    DOB: 1946/05/07, 72 y.o.   MRN: [id number]  HPI Here for memory issues as well as cough   Has had the "crud"  Can't sleep-coughing all night  No otc meds  Temp: 98.4 F (36.9 C)  Some sinus pain -over eyes  Started last week- over a week ago - no worse or better  Some green mucous -nasal Not much prod cough   Anxiety Takes low dose buspar  Has always been high strung -he whole life  occ gets overwhelmed  Is a worrier  Stressor  Husband is difficult to deal with  Worries about her sons     Son is noting short term memory issues  moreso if out of routine  No confusion  Perhaps struggled with directions a bit more      Wt Readings from Last 3 Encounters:  01/25/18 133 lb 8 oz (60.6 kg)  12/17/17 134 lb 12 oz (61.1 kg)  08/20/17 136 lb 4 oz (61.8 kg)   23.65 kg/m   [facility]- failed hearing exam  20/20 on mini cog -good score   Takes buspar since 8/17 for stress rxn  No results found for: [id number] Lab Results  Component Value Date   TSH 2.02 07/27/2017     Chemistry      Component Value Date/Time   NA 141 07/27/2017 1024   NA 144 03/10/2017 1025   K 4.4 07/27/2017 1024   K 4.1 03/10/2017 1025   CL 106 07/27/2017 1024   CL 105 03/08/2013 0851   CO2 23 07/27/2017 1024   CO2 27 03/10/2017 1025   BUN 21 07/27/2017 1024   BUN 28.3 (H) 03/10/2017 1025   CREATININE 0.96 (H) 07/27/2017 1024   CREATININE 0.9 03/10/2017 1025      Component Value Date/Time   CALCIUM 9.1 07/27/2017 1024   CALCIUM 9.4 03/10/2017 1025   ALKPHOS 50 07/27/2017 1024   ALKPHOS 75 03/10/2017 1025   AST 19 07/27/2017 1024   AST 20 03/10/2017 1025   ALT 12 07/27/2017 1024   ALT 17 03/10/2017 1025   BILITOT 0.7 07/27/2017 1024   BILITOT 0.43 03/10/2017 1025     Lab Results  Component Value Date   WBC 7.5 03/10/2017   HGB 13.2 03/10/2017   HCT 39.3 03/10/2017   MCV 89.4 03/10/2017   PLT 189 03/10/2017      Vit D 63 Lab Results  Component Value Date   HGBA1C 5.6 07/27/2017    Patient Active Problem List   Diagnosis Date Noted  . Viral URI with cough 01/25/2018  . Memory loss 01/25/2018  . History of vitamin D deficiency 07/31/2017  . Stress reaction 08/04/2016  . Estrogen deficiency 07/04/2016  . Aortic atherosclerosis ([place]) 07/06/2015  . Colon cancer screening 06/29/2015  . Routine general medical examination at a health care facility 06/21/2015  . Chest pain 03/11/2015  . Left bundle branch block (LBBB) 07/06/2014  . Encounter for Medicare annual wellness exam 03/17/2013  . 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 Pain in joint, ankle and foot 03/16/2013  . Hyperglycemia 12/01/2011  . History of B-cell lymphoma 11/22/2011  . ARTHRITIS, HANDS, BILATERAL 12/12/2010  . Dyslipidemia 04/02/2008  . ALLERGIC RHINITIS 04/02/2008  . GERD 04/02/2008  . Osteopenia 09/13/2007  . ALLERGY 08/06/2007   Past Medical History:  Diagnosis Date  . Allergic rhinitis   . Arthritis    "hands; probably qwhere" (  03/12/2015)  . Basal cell carcinoma ~ 2010   "leg"  . Chronic back pain   . Diffuse large B cell lymphoma ([place]) 11/07/11   dx from tonsilar biopsy  . External hemorrhoid   . GERD (gastroesophageal reflux disease)   . History of radiation therapy 03/22/12 - 04/09/12   right oropharynx/neck  . HLD (hyperlipidemia)   . LBBB (left bundle branch block) dx'd 05/2014  . Non Hodgkin's lymphoma ([place])   . Osteopenia    Past Surgical History:  Procedure Laterality Date  . ABDOMINAL HYSTERECTOMY  1988   "partial; for fibroids"  . BASAL CELL CARCINOMA EXCISION Right ~ 2010   "leg"  . BREAST BIOPSY Left 12/03   Negative  . COLONOSCOPY  5/06   Ext. hemms  . DILATION AND CURETTAGE OF UTERUS  1987  . TONSILLECTOMY  11/07/2011   Procedure: TONSILLECTOMY;  Surgeon: Rozetta Nunnery, MD;  [facility]  Service: ENT;  Laterality: N/A;  . TONSILLECTOMY  10/2011    "right was cancerous"  . TUBAL LIGATION  1987   Social History   Tobacco Use  . Smoking status: Former Smoker    Packs/day: 0.25    Years: 10.00    Pack years: 2.50    Types: Cigarettes    Last attempt to quit: 11/04/1974    Years since quitting: 43.2  . Smokeless tobacco: Never Used  Substance Use Topics  . Alcohol use: Yes    Alcohol/week: 0.0 oz    Comment: rare  . Drug use: No   Family History  Problem Relation Age of Onset  . Heart attack Father 52  . Heart disease Father   . Stroke Mother 10  . Breast cancer Paternal Aunt   . Cancer Paternal Aunt   . Cancer Paternal Aunt   . Hyperlipidemia Unknown        Family history  . Colon cancer Neg Hx   . Colon polyps Neg Hx    Allergies  Allergen Reactions  . Pnu-Imune [Pneumococcal Polysaccharide Vaccine] Hives   Current Outpatient Medications on File Prior to Visit  Medication Sig Dispense Refill  . acetaminophen (TYLENOL) 500 MG tablet Take 1,000 mg by mouth every 8 (eight) hours as needed for mild pain or headache.    . alendronate (FOSAMAX) 70 MG tablet TAKE 1 TABLET BY MOUTH EVERY 7 DAYS ON AN EMPTY STOMACH WITH A FULL GLASS OF WATER 12 tablet 3  . aspirin 81 MG tablet Take 81 mg by mouth every evening.     Marland Kitchen atorvastatin (LIPITOR) 80 MG tablet TAKE 1 TABLET (80 MG TOTAL) BY MOUTH DAILY. 90 tablet 2  . busPIRone (BUSPAR) 15 MG tablet Take 0.5 tablets (7.5 mg total) by mouth 2 (two) times daily. 90 tablet 3  . Calcium-Vitamin D-Vitamin K 500-100-40 MG-UNT-MCG CHEW Chew 2 tablets by mouth daily.     . Cholecalciferol (VITAMIN D-3) 1000 UNITS CAPS Take 2,000 Units by mouth daily.     . meloxicam (MOBIC) 7.5 MG tablet Take 1 tablet (7.5 mg total) by mouth daily. (Patient taking differently: Take 7.5 mg by mouth daily as needed. ) 90 tablet 1  . Multiple Vitamin (MULTIVITAMIN) tablet Take 1 tablet by mouth daily.      Marland Kitchen omeprazole (PRILOSEC) 20 MG capsule TAKE 1 CAPSULE BY MOUTH DAILY AS NEEDED FOR HEARTBURN 90 capsule 3   . [DISCONTINUED] prochlorperazine (COMPAZINE) 10 MG tablet Take 1 tablet (10 mg total) by mouth every 6 (six) hours as  needed (Nausea or vomiting). 30 tablet 6   No current facility-administered medications on file prior to visit.     Review of Systems  Constitutional: Positive for fatigue. Negative for activity change, appetite change, fever and unexpected weight change.  HENT: Positive for postnasal drip and sneezing. Negative for congestion, ear pain, rhinorrhea, sinus pressure and sore throat.   Eyes: Negative for pain, discharge, redness and visual disturbance.  Respiratory: Positive for cough. Negative for shortness of breath, wheezing and stridor.   Cardiovascular: Negative for chest pain and palpitations.  Gastrointestinal: Negative for abdominal pain, blood in stool, constipation, diarrhea, nausea and vomiting.  Endocrine: Negative for polydipsia and polyuria.  Genitourinary: Negative for dysuria, frequency, hematuria and urgency.  Musculoskeletal: Negative for arthralgias, back pain and myalgias.  Skin: Negative for pallor and rash.  Allergic/Immunologic: Negative for environmental allergies.  Neurological: Negative for dizziness, syncope, weakness, light-headedness and headaches.  Hematological: Negative for adenopathy. Does not bruise/bleed easily.  Psychiatric/Behavioral: Negative for confusion, decreased concentration and dysphoric mood. The patient is not nervous/anxious.        Objective:   Physical Exam  Constitutional: She is oriented to person, place, and time. She appears well-developed and well-nourished. No distress.  Well appearing   HENT:  Head: Normocephalic and atraumatic.  Right Ear: External ear normal.  Left Ear: External ear normal.  Nose: Nose normal.  Mouth/Throat: Oropharynx is clear and moist. No oropharyngeal exudate.  Nares are injected and congested  No sinus tenderness Clear rhinorrhea and post nasal drip   Eyes: Conjunctivae and EOM are  normal. Pupils are equal, round, and reactive to light. Right eye exhibits no discharge. Left eye exhibits no discharge. No scleral icterus.  No nystagmus  Neck: Normal range of motion and full passive range of motion without pain. Neck supple. No JVD present. Carotid bruit is not present. No tracheal deviation present. No thyromegaly present.  Cardiovascular: Normal rate, regular rhythm and normal heart sounds.  No murmur heard. Pulmonary/Chest: Effort normal and breath sounds normal. No respiratory distress. She has no wheezes. She has no rales. She exhibits no tenderness.  Good air exch  No wheeze or rales or rhonchi  Cough is dry sounding   Abdominal: Soft. Bowel sounds are normal. She exhibits no distension and no mass. There is no tenderness.  Musculoskeletal: She exhibits no edema or tenderness.  Lymphadenopathy:    She has no cervical adenopathy.  Neurological: She is alert and oriented to person, place, and time. She has normal strength and normal reflexes. She displays no atrophy and no tremor. No cranial nerve deficit or sensory deficit. She exhibits normal muscle tone. She displays a negative Romberg sign. Coordination and gait normal.  No focal cerebellar signs   Skin: Skin is warm and dry. No rash noted. No pallor.  Psychiatric: She has a normal mood and affect. Her behavior is normal. Thought content normal. Her mood appears not anxious. Her affect is not blunt and not labile. Her speech is tangential. She is not slowed and not withdrawn. Thought content is not paranoid. She does not exhibit a depressed mood. She expresses no homicidal and no suicidal ideation.  Pleasant/talkative  Son here with her is supportive   MMS 27.5/30-missing day of the week and date  Half point off for calculation (serial sevens)  Missed one word recall of 3            Assessment & Plan:   Problem List Items Addressed This Visit  Respiratory   Viral URI with cough    Reassuring  exam sympt care mucinex DM Tessalon pearles prn  Update if not starting to improve in a week or if worsening   Disc symptomatic care - see instructions on AVS         Other   Memory loss    This may be age related and multifactorial Short term only  ? If slowing cognition 27.5/30 MMS score with nl clock draw  Lab today (tsh/B12/cmet) Disc stress rxn -consider counseling Disc health habits/sleep/diet Disc socialization  Helpful son here with her today  Fu approx 2 wk Info given on aricept to consider-disc at f/u  Could also consider neuro ref       Relevant Orders   TSH   Comprehensive metabolic panel   Vitamin W38

## 2018-01-25 NOTE — Assessment & Plan Note (Signed)
Reassuring exam sympt care mucinex DM Tessalon pearles prn  Update if not starting to improve in a week or if worsening   Disc symptomatic care - see instructions on AVS

## 2018-01-25 NOTE — Patient Instructions (Addendum)
Socialize as much as you can  Keep doing your exercise classes Drink 64 oz of fluids daily  Eat regular meals with protein Read  Do word puzzles   Let's do some labs today  Talk to your family  We would consider   For your cold- get mucinex DM over the counter and take as directed  I also sent in tessalon for cough  Update if not starting to improve in a week or if worsening  (we will start you on an antibiotic)   Follow up in 2 weeks please

## 2018-01-28 ENCOUNTER — Telehealth: Payer: Self-pay | Admitting: *Deleted

## 2018-01-28 MED ORDER — DOXYCYCLINE HYCLATE 100 MG PO TABS: 100.0000 mg | ORAL_TABLET | Freq: Two times a day (BID) | ORAL | 0 refills | Status: DC

## 2018-01-28 NOTE — Telephone Encounter (Signed)
Pt notified Rx sent and to f/u if no improvement  °

## 2018-01-28 NOTE — Telephone Encounter (Signed)
I sent doxycycline to her pharmacy  Keep [place] posted-update if worse or no improvement

## 2018-01-28 NOTE — Telephone Encounter (Signed)
Copied from [place]. Topic: General - Other >> Jan 28, 2018  9:54 AM Yvette Rack wrote: Reason for CRM: patient was in the office to see Dr Glori Bickers on 01-25-18 she state that if the pt get any worst to give her a call and she would give her an antibiotic ,the pt has gotten worst bad cough and green mucus [id number] [place] [place] [phone] (Phone) [phone] (Fax)

## 2018-02-08 ENCOUNTER — Ambulatory Visit: Payer: Medicare Other | Admitting: Family Medicine

## 2018-02-08 ENCOUNTER — Encounter: Payer: Self-pay | Admitting: Family Medicine

## 2018-02-08 VITALS — BP 126/78 | HR 79 | Temp 98.4°F | Ht 63.0 in | Wt 132.2 lb

## 2018-02-08 DIAGNOSIS — G3184 Mild cognitive impairment, so stated: Secondary | ICD-10-CM

## 2018-02-08 DIAGNOSIS — I700 Atherosclerosis of aorta: Secondary | ICD-10-CM

## 2018-02-08 DIAGNOSIS — F430 Acute stress reaction: Secondary | ICD-10-CM | POA: Diagnosis not present

## 2018-02-08 DIAGNOSIS — E785 Hyperlipidemia, unspecified: Secondary | ICD-10-CM

## 2018-02-08 MED ORDER — DONEPEZIL HCL 5 MG PO TABS: 5.0000 mg | ORAL_TABLET | Freq: Every day | ORAL | 11 refills | Status: DC

## 2018-02-08 NOTE — Assessment & Plan Note (Signed)
Doing ok with buspar-feels she is handling it well Reviewed stressors/ coping techniques/symptoms/ support sources/ tx options and side effects in detail today  Goes get overwhelmed at times

## 2018-02-08 NOTE — Assessment & Plan Note (Signed)
No clinical changes Monitored by cardiology

## 2018-02-08 NOTE — Assessment & Plan Note (Signed)
Disc goals for lipids and reasons to control them Rev labs with pt Rev low sat fat diet in detail Controlled with atorvastatin 80

## 2018-02-08 NOTE — Assessment & Plan Note (Signed)
Long disc re: last visit and multifactorial symptoms  Rev labs  Rev MMS  Will begin aricept 5 mg at bedtime to see how tolerated  If no problems-will titrate up at next visit  Disc stress reduction/ effort to not overload/ imp of self care and down time and socialization

## 2018-02-08 NOTE — Progress Notes (Signed)
Subjective:    Patient ID: Tara Sloan, female    DOB: 1946/01/04, 72 y.o.   MRN: [id number]  HPI Here for fu of memory and cog issues  Tara Sloan is getting better   Rev last note Multifactorial  Some stress rxn issues  Thinks her son was exaggerating somewhat at her last visit   Labs done Office Visit on 01/25/2018  Component Date Value Ref Range Status  . TSH 01/25/2018 1.86  0.35 - 4.50 uIU/mL Final  . Sodium 01/25/2018 141  135 - 145 mEq/L Final  . Potassium 01/25/2018 3.5  3.5 - 5.1 mEq/L Final  . Chloride 01/25/2018 104  96 - 112 mEq/L Final  . CO2 01/25/2018 28  19 - 32 mEq/L Final  . Glucose, Bld 01/25/2018 110* 70 - 99 mg/dL Final  . BUN 01/25/2018 20  6 - 23 mg/dL Final  . Creatinine, Ser 01/25/2018 0.93  0.40 - 1.20 mg/dL Final  . Total Bilirubin 01/25/2018 0.5  0.2 - 1.2 mg/dL Final  . Alkaline Phosphatase 01/25/2018 60  39 - 117 U/L Final  . AST 01/25/2018 16  0 - 37 U/L Final  . ALT 01/25/2018 13  0 - 35 U/L Final  . Total Protein 01/25/2018 6.6  6.0 - 8.3 g/dL Final  . Albumin 01/25/2018 3.9  3.5 - 5.2 g/dL Final  . Calcium 01/25/2018 9.8  8.4 - 10.5 mg/dL Final  . GFR 01/25/2018 62.95  >60.00 mL/min Final  . Vitamin B-12 01/25/2018 >1500* 211 - 911 pg/mL Final    Given info on aricept   Wt Readings from Last 3 Encounters:  02/08/18 132 lb 4 oz (60 kg)  01/25/18 133 lb 8 oz (60.6 kg)  12/17/17 134 lb 12 oz (61.1 kg)   23.43 kg/m   Takes lipitor 80 for cholesterol Lab Results  Component Value Date   CHOL 127 07/27/2017   HDL 32 (L) 07/27/2017   LDLCALC 67 07/27/2017   LDLDIRECT 92.2 03/02/2012   TRIG 142 07/27/2017   CHOLHDL 4.0 07/27/2017   buspar for anxiety -this helps  She has a Company secretary that does counseling for her church   After thinking about it - thinks her busy busy schedule may cause some of her cognitive issues  She continues to socialize with groups whenever possible  Very active in church and choir   Stressors -husb 42 y  older than her   She is open to the aricept to slow down memory loss Disc poss side eff incl nausea or change in sleep  Patient Active Problem List   Diagnosis Date Noted  . Mild cognitive impairment 01/25/2018  . History of vitamin D deficiency 07/31/2017  . Stress reaction 08/04/2016  . Estrogen deficiency 07/04/2016  . Aortic atherosclerosis ([place]) 07/06/2015  . Colon cancer screening 06/29/2015  . Routine general medical examination at a health care facility 06/21/2015  . Chest pain 03/11/2015  . Left bundle branch block (LBBB) 07/06/2014  . Encounter for Medicare annual wellness exam 03/17/2013  . 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 Pain in joint, ankle and foot 03/16/2013  . Hyperglycemia 12/01/2011  . History of B-cell lymphoma 11/22/2011  . ARTHRITIS, HANDS, BILATERAL 12/12/2010  . Dyslipidemia 04/02/2008  . ALLERGIC RHINITIS 04/02/2008  . GERD 04/02/2008  . Osteopenia 09/13/2007  . ALLERGY 08/06/2007   Past Medical History:  Diagnosis Date  . Allergic rhinitis   . Arthritis    "hands; probably qwhere" (03/12/2015)  . Basal cell carcinoma ~ 2010   "  leg"  . Chronic back pain   . 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 External hemorrhoid   . GERD (gastroesophageal reflux disease)   . History of radiation therapy 03/22/12 - 04/09/12   right oropharynx/neck  . HLD (hyperlipidemia)   . LBBB (left bundle branch block) dx'd 05/2014  . Non Hodgkin's lymphoma ([place])   . Osteopenia    Past Surgical History:  Procedure Laterality Date  . ABDOMINAL HYSTERECTOMY  1988   "partial; for fibroids"  . BASAL CELL CARCINOMA EXCISION Right ~ 2010   "leg"  . BREAST BIOPSY Left 12/03   Negative  . COLONOSCOPY  5/06   Ext. hemms  . DILATION AND CURETTAGE OF UTERUS  1987  . TONSILLECTOMY  11/07/2011   Procedure: TONSILLECTOMY;  Surgeon: Rozetta Nunnery, MD;  Location: [facility];  Service: ENT;  Laterality: N/A;  .  TONSILLECTOMY  10/2011   "right was cancerous"  . TUBAL LIGATION  1987   Social History   Tobacco Use  . Smoking status: Former Smoker    Packs/day: 0.25    Years: 10.00    Pack years: 2.50    Types: Cigarettes    Last attempt to quit: 11/04/1974    Years since quitting: 43.2  . Smokeless tobacco: Never Used  Substance Use Topics  . Alcohol use: Yes    Alcohol/week: 0.0 oz    Comment: rare  . Drug use: No   Family History  Problem Relation Age of Onset  . Heart attack Father 58  . Heart disease Father   . Stroke Mother 45  . Breast cancer Paternal Aunt   . Cancer Paternal Aunt   . Cancer Paternal Aunt   . Hyperlipidemia Unknown        Family history  . Colon cancer Neg Hx   . Colon polyps Neg Hx    Allergies  Allergen Reactions  . Pnu-Imune [Pneumococcal Polysaccharide Vaccine] Hives   Current Outpatient Medications on File Prior to Visit  Medication Sig Dispense Refill  . acetaminophen (TYLENOL) 500 MG tablet Take 1,000 mg by mouth every 8 (eight) hours as needed for mild pain or headache.    . alendronate (FOSAMAX) 70 MG tablet TAKE 1 TABLET BY MOUTH EVERY 7 DAYS ON AN EMPTY STOMACH WITH A FULL GLASS OF WATER 12 tablet 3  . aspirin 81 MG tablet Take 81 mg by mouth every evening.     Marland Kitchen atorvastatin (LIPITOR) 80 MG tablet TAKE 1 TABLET (80 MG TOTAL) BY MOUTH DAILY. 90 tablet 2  . benzonatate (TESSALON) 200 MG capsule Take 1 capsule (200 mg total) by mouth 3 (three) times daily as needed for cough. Swallow whole, to not bite pill 30 capsule 1  . busPIRone (BUSPAR) 15 MG tablet Take 0.5 tablets (7.5 mg total) by mouth 2 (two) times daily. 90 tablet 3  . Calcium-Vitamin D-Vitamin K 500-100-40 MG-UNT-MCG CHEW Chew 2 tablets by mouth daily.     . Cholecalciferol (VITAMIN D-3) 1000 UNITS CAPS Take 2,000 Units by mouth daily.     . meloxicam (MOBIC) 7.5 MG tablet Take 1 tablet (7.5 mg total) by mouth daily. (Patient taking differently: Take 7.5 mg by mouth daily as needed. )  90 tablet 1  . Multiple Vitamin (MULTIVITAMIN) tablet Take 1 tablet by mouth daily.      Marland Kitchen omeprazole (PRILOSEC) 20 MG capsule TAKE 1 CAPSULE BY MOUTH DAILY AS NEEDED FOR HEARTBURN 90 capsule  3  . [DISCONTINUED] prochlorperazine (COMPAZINE) 10 MG tablet Take 1 tablet (10 mg total) by mouth every 6 (six) hours as needed (Nausea or vomiting). 30 tablet 6   No current facility-administered medications on file prior to visit.     Review of Systems  Constitutional: Positive for fatigue. Negative for activity change, appetite change, fever and unexpected weight change.  HENT: Negative for congestion, ear pain, rhinorrhea, sinus pressure and sore throat.   Eyes: Negative for pain, redness and visual disturbance.  Respiratory: Negative for cough, shortness of breath and wheezing.   Cardiovascular: Negative for chest pain and palpitations.  Gastrointestinal: Negative for abdominal pain, blood in stool, constipation and diarrhea.  Endocrine: Negative for polydipsia and polyuria.  Genitourinary: Negative for dysuria, frequency and urgency.  Musculoskeletal: Negative for arthralgias, back pain and myalgias.  Skin: Negative for pallor and rash.  Allergic/Immunologic: Negative for environmental allergies.  Neurological: Negative for dizziness, syncope and headaches.  Hematological: Negative for adenopathy. Does not bruise/bleed easily.  Psychiatric/Behavioral: Negative for confusion, decreased concentration and dysphoric mood. The patient is not nervous/anxious.        Pos for stressors        Objective:   Physical Exam  Constitutional: She appears well-developed and well-nourished. No distress.  Well appearing   HENT:  Head: Normocephalic and atraumatic.  Mouth/Throat: Oropharynx is clear and moist.  Eyes: Conjunctivae and EOM are normal. Pupils are equal, round, and reactive to light.  Neck: Normal range of motion. Neck supple. No JVD present. Carotid bruit is not present. No thyromegaly  present.  Cardiovascular: Normal rate, regular rhythm, normal heart sounds and intact distal pulses. Exam reveals no gallop.  Pulmonary/Chest: Effort normal and breath sounds normal. No respiratory distress. She has no wheezes. She has no rales.  No crackles  Abdominal: Soft. Bowel sounds are normal. She exhibits no abdominal bruit.  Musculoskeletal: She exhibits no edema.  Lymphadenopathy:    She has no cervical adenopathy.  Neurological: She is alert. She has normal reflexes.  Skin: Skin is warm and dry. No rash noted. No pallor.  Psychiatric: She has a normal mood and affect. Her behavior is normal. Thought content normal.  Mood is good today  Mentally sharp           Assessment & Plan:   Problem List Items Addressed This Visit      Cardiovascular and Mediastinum   Aortic atherosclerosis ([place])    No clinical changes Monitored by cardiology         Nervous and Auditory   Mild cognitive impairment - Primary    Long disc re: last visit and multifactorial symptoms  Rev labs  Rev MMS  Will begin aricept 5 mg at bedtime to see how tolerated  If no problems-will titrate up at next visit  Disc stress reduction/ effort to not overload/ imp of self care and down time and socialization         Other   Dyslipidemia    Disc goals for lipids and reasons to control them Rev labs with pt Rev low sat fat diet in detail Controlled with atorvastatin 80      Stress reaction    Doing ok with buspar-feels she is handling it well Reviewed stressors/ coping techniques/symptoms/ support sources/ tx options and side effects in detail today  Goes get overwhelmed at times

## 2018-02-08 NOTE — Patient Instructions (Addendum)
Do pursue counseling with your minister if able  Continue current medicines  Please take care of yourself- make time for "down time" as well Try not to multi task too much  Keep socializing   Start aricept 5 mg  If any side effects please alert me

## 2018-02-12 ENCOUNTER — Other Ambulatory Visit: Payer: Self-pay | Admitting: Family Medicine

## 2018-03-08 ENCOUNTER — Other Ambulatory Visit: Payer: Self-pay | Admitting: Cardiovascular Disease

## 2018-05-03 ENCOUNTER — Telehealth: Payer: Self-pay | Admitting: Family Medicine

## 2018-05-03 DIAGNOSIS — M1710 Unilateral primary osteoarthritis, unspecified knee: Secondary | ICD-10-CM | POA: Insufficient documentation

## 2018-05-03 DIAGNOSIS — M1711 Unilateral primary osteoarthritis, right knee: Secondary | ICD-10-CM

## 2018-05-03 DIAGNOSIS — M179 Osteoarthritis of knee, unspecified: Secondary | ICD-10-CM | POA: Insufficient documentation

## 2018-05-03 NOTE — Telephone Encounter (Signed)
Copied from [place] [id number]. Topic: Referral - Status >> May 03, 2018 12:52 PM Rutherford Nail, [place] wrote: Reason for CRM: Patient calling to inquire about the referral to Dr Para March to get a 2nd opinion about her knee. Would a call with an update. Please advise. CB#: [phone] can leave a message

## 2018-05-03 NOTE — Telephone Encounter (Signed)
I do not see previous request for referral to Dr Para March for 2nd opinion about her knee. Pt last seen 02/08/18.Please advise.

## 2018-05-03 NOTE — Telephone Encounter (Signed)
I did no see one either  I put a referral in and will route to [facility]

## 2018-05-04 NOTE — Telephone Encounter (Signed)
Appt made with Dr Wainer and patient aware. °

## 2018-05-07 DIAGNOSIS — M170 Bilateral primary osteoarthritis of knee: Secondary | ICD-10-CM | POA: Diagnosis not present

## 2018-05-10 ENCOUNTER — Telehealth: Payer: Self-pay | Admitting: Cardiovascular Disease

## 2018-05-10 NOTE — Telephone Encounter (Signed)
   Tara Sloan Pre-operative Risk Assessment    Request for surgical clearance:  1. What type of surgery is being performed? Right Partial Knee Replacement  2. When is this surgery scheduled? Not listed   3. What type of clearance is required (medical clearance vs. Pharmacy clearance to hold med vs. Both)? Not listed  4. Are there any medications that need to be held prior to surgery and how long? Not listed  5. Practice name and name of physician performing surgery? [facility], Dr. Fredonia Highland   6. What is your office phone number [phone]   7.   What is your office fax number [phone]  8.   Anesthesia type (None, local, MAC, general) ? Not listed   Tara Sloan 05/10/2018, 3:39 PM  _________________________________________________________________   (provider comments below)

## 2018-05-10 NOTE — Telephone Encounter (Signed)
Acceptable risk for surgery No further testing needed No meds to hold 

## 2018-05-11 ENCOUNTER — Telehealth: Payer: Self-pay | Admitting: Family Medicine

## 2018-05-11 NOTE — Telephone Encounter (Signed)
Clearance routed to number listed. 

## 2018-05-11 NOTE — Telephone Encounter (Signed)
appt scheduled

## 2018-05-11 NOTE — Telephone Encounter (Signed)
Pt's spouse notified pt needs appt and he requested that I call back this afternoon to schedule it directly with pt, I will call back later

## 2018-05-11 NOTE — Telephone Encounter (Signed)
In your inbox.

## 2018-05-11 NOTE — Telephone Encounter (Signed)
Please schedule a surg clearance appt

## 2018-05-11 NOTE — Telephone Encounter (Signed)
Pt's son dropped off form for surgical clearance. Placed in [place] tower.

## 2018-05-13 ENCOUNTER — Ambulatory Visit: Payer: Medicare Other | Admitting: Family Medicine

## 2018-05-13 ENCOUNTER — Encounter: Payer: Self-pay | Admitting: Family Medicine

## 2018-05-13 VITALS — BP 110/68 | HR 76 | Temp 98.2°F | Ht 63.0 in | Wt 131.5 lb

## 2018-05-13 DIAGNOSIS — H2513 Age-related nuclear cataract, bilateral: Secondary | ICD-10-CM | POA: Diagnosis not present

## 2018-05-13 DIAGNOSIS — M1711 Unilateral primary osteoarthritis, right knee: Secondary | ICD-10-CM | POA: Diagnosis not present

## 2018-05-13 DIAGNOSIS — Z01818 Encounter for other preprocedural examination: Secondary | ICD-10-CM | POA: Insufficient documentation

## 2018-05-13 NOTE — Assessment & Plan Note (Signed)
Ready for R partial knee replacement with Dr Percell Miller Cleared medically  Has already rec cardiac clearance  She will hold fosamax for 1 wk before and until healed  Asa -hold per her surgeon's advisement  Enc use of a cane until her surgery  No hx of anesthesia problems  Rev med allergies (only pneumovax)

## 2018-05-13 NOTE — Progress Notes (Signed)
Subjective:    Patient ID: Tara Sloan, female    DOB: [birth year], 72 y.o.   MRN: [id number]  HPI Here for surgical clearance   Having a R partial knee replacement on 6/18 with Dr Percell Miller  Having a lot of pain when she is active  Not using a cane - probably needs one   She was already cleared by her cardiologist (aortic atherosclerosis), CAD, LBBB  Wt Readings from Last 3 Encounters:  05/13/18 131 lb 8 oz (59.6 kg)  02/08/18 132 lb 4 oz (60 kg)  01/25/18 133 lb 8 oz (60.6 kg)   23.29 kg/m   Former smoker- quit 1975 No lung disease or copd   Personal hx of B cell lyphoma  Lab Results  Component Value Date   WBC 7.5 03/10/2017   HGB 13.2 03/10/2017   HCT 39.3 03/10/2017   MCV 89.4 03/10/2017   PLT 189 03/10/2017   Hyperglycemia Lab Results  Component Value Date   HGBA1C 5.6 07/27/2017   Osteopenia -on fosamax   Takes 81 mg asa daily - will hold before surgery  No hx of blood clots  Also on fosamax -will have to hold this    Allergic to pneumovax- gave her hives No hx of anaphylaxis   Anesthesia  history - no problems at all  Will have spinal anesthesia   Patient Active Problem List   Diagnosis Date Noted  . Pre-operative clearance 05/13/2018  . Knee osteoarthritis 05/03/2018  . Mild cognitive impairment 01/25/2018  . History of vitamin D deficiency 07/31/2017  . Stress reaction 08/04/2016  . Estrogen deficiency 07/04/2016  . Aortic atherosclerosis ([place]) 07/06/2015  . Colon cancer screening 06/29/2015  . Routine general medical examination at a health care facility 06/21/2015  . Chest pain 03/11/2015  . Left bundle branch block (LBBB) 07/06/2014  . Encounter for Medicare annual wellness exam 03/17/2013  . 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 Pain in joint, ankle and foot 03/16/2013  . Hyperglycemia 12/01/2011  . History of B-cell lymphoma 11/22/2011  . ARTHRITIS, HANDS, BILATERAL 12/12/2010  . Dyslipidemia 04/02/2008  . ALLERGIC RHINITIS  04/02/2008  . GERD 04/02/2008  . Osteopenia 09/13/2007  . ALLERGY 08/06/2007   Past Medical History:  Diagnosis Date  . Allergic rhinitis   . Arthritis    "hands; probably qwhere" (03/12/2015)  . Basal cell carcinoma ~ 2010   "leg"  . Chronic back pain   . 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 External hemorrhoid   . GERD (gastroesophageal reflux disease)   . History of radiation therapy 03/22/12 - 04/09/12   right oropharynx/neck  . HLD (hyperlipidemia)   . LBBB (left bundle branch block) dx'd 05/2014  . Non Hodgkin's lymphoma ([place])   . Osteopenia    Past Surgical History:  Procedure Laterality Date  . ABDOMINAL HYSTERECTOMY  1988   "partial; for fibroids"  . BASAL CELL CARCINOMA EXCISION Right ~ 2010   "leg"  . BREAST BIOPSY Left 12/03   Negative  . COLONOSCOPY  5/06   Ext. hemms  . DILATION AND CURETTAGE OF UTERUS  1987  . TONSILLECTOMY  11/07/2011   Procedure: TONSILLECTOMY;  Surgeon: Rozetta Nunnery, MD;  Location: [facility];  Service: ENT;  Laterality: N/A;  . TONSILLECTOMY  10/2011   "right was cancerous"  . TUBAL LIGATION  1987   Social History   Tobacco Use  . Smoking status: Former Smoker  Packs/day: 0.25    Years: 10.00    Pack years: 2.50    Types: Cigarettes    Last attempt to quit: 11/04/1974    Years since quitting: 43.5  . Smokeless tobacco: Never Used  Substance Use Topics  . Alcohol use: Yes    Alcohol/week: 0.0 oz    Comment: rare  . Drug use: No   Family History  Problem Relation Age of Onset  . Heart attack Father 18  . Heart disease Father   . Stroke Mother 68  . Breast cancer Paternal Aunt   . Cancer Paternal Aunt   . Cancer Paternal Aunt   . Hyperlipidemia Unknown        Family history  . Colon cancer Neg Hx   . Colon polyps Neg Hx    Allergies  Allergen Reactions  . Pnu-Imune [Pneumococcal Polysaccharide Vaccine] Hives   Current Outpatient Medications on File Prior  to Visit  Medication Sig Dispense Refill  . acetaminophen (TYLENOL) 500 MG tablet Take 1,000 mg by mouth every 8 (eight) hours as needed for mild pain or headache.    . alendronate (FOSAMAX) 70 MG tablet TAKE 1 TABLET BY MOUTH EVERY 7 DAYS ON AN EMPTY STOMACH WITH A FULL GLASS OF WATER 12 tablet 3  . aspirin 81 MG tablet Take 81 mg by mouth every evening.     Marland Kitchen atorvastatin (LIPITOR) 80 MG tablet TAKE 1 TABLET (80 MG TOTAL) BY MOUTH DAILY. 90 tablet 1  . Calcium-Vitamin D-Vitamin K 500-100-40 MG-UNT-MCG CHEW Chew 2 tablets by mouth daily.     . Cholecalciferol (VITAMIN D-3) 1000 UNITS CAPS Take 2,000 Units by mouth daily.     Marland Kitchen donepezil (ARICEPT) 5 MG tablet Take 1 tablet (5 mg total) by mouth at bedtime. 30 tablet 11  . meloxicam (MOBIC) 7.5 MG tablet Take 1 tablet (7.5 mg total) by mouth daily. (Patient taking differently: Take 7.5 mg by mouth daily as needed. ) 90 tablet 1  . Multiple Vitamin (MULTIVITAMIN) tablet Take 1 tablet by mouth daily.      Marland Kitchen omeprazole (PRILOSEC) 20 MG capsule TAKE 1 CAPSULE BY MOUTH DAILY AS NEEDED FOR HEARTBURN 90 capsule 3  . [DISCONTINUED] prochlorperazine (COMPAZINE) 10 MG tablet Take 1 tablet (10 mg total) by mouth every 6 (six) hours as needed (Nausea or vomiting). 30 tablet 6   No current facility-administered medications on file prior to visit.     Review of Systems  Constitutional: Negative for activity change, appetite change, fatigue, fever and unexpected weight change.  HENT: Negative for congestion, ear pain, rhinorrhea, sinus pressure and sore throat.   Eyes: Negative for pain, redness and visual disturbance.  Respiratory: Negative for cough, shortness of breath and wheezing.   Cardiovascular: Negative for chest pain and palpitations.  Gastrointestinal: Negative for abdominal pain, blood in stool, constipation and diarrhea.  Endocrine: Negative for polydipsia and polyuria.  Genitourinary: Negative for dysuria, frequency and urgency.    Musculoskeletal: Positive for arthralgias. Negative for back pain and myalgias.       Knee pain   Skin: Negative for pallor and rash.  Allergic/Immunologic: Negative for environmental allergies.  Neurological: Negative for dizziness, syncope and headaches.  Hematological: Negative for adenopathy. Does not bruise/bleed easily.  Psychiatric/Behavioral: Negative for decreased concentration and dysphoric mood. The patient is not nervous/anxious.        Objective:   Physical Exam  Constitutional: She appears well-developed and well-nourished. No distress.  Well appearing   HENT:  Head: Normocephalic  and atraumatic.  Mouth/Throat: Oropharynx is clear and moist.  Eyes: Pupils are equal, round, and reactive to light. Conjunctivae and EOM are normal.  Neck: Normal range of motion. Neck supple. No JVD present. Carotid bruit is not present. No thyromegaly present.  Cardiovascular: Normal rate, regular rhythm and intact distal pulses. Exam reveals gallop.  Pulmonary/Chest: Effort normal and breath sounds normal. No respiratory distress. She has no wheezes. She has no rales.  No crackles  Abdominal: Soft. Bowel sounds are normal. She exhibits no distension, no abdominal bruit and no mass. There is no tenderness.  Musculoskeletal: She exhibits no edema.  Reduced rom of R knee  Gait favors LLE  Lymphadenopathy:    She has no cervical adenopathy.  Neurological: She is alert. She has normal reflexes. She displays normal reflexes. No cranial nerve deficit. Coordination normal.  Skin: Skin is warm and dry. No rash noted. No erythema. No pallor.  Psychiatric: She has a normal mood and affect.  pleasant          Assessment & Plan:   Problem List Items Addressed This Visit      Musculoskeletal and Integument   Knee osteoarthritis    Prepping for partial knee replacement 6/18         Other   Pre-operative clearance - Primary    Ready for R partial knee replacement with Dr Percell Miller Cleared  medically  Has already rec cardiac clearance  She will hold fosamax for 1 wk before and until healed  Asa -hold per her surgeon's advisement  Enc use of a cane until her surgery  No hx of anesthesia problems  Rev med allergies (only pneumovax)

## 2018-05-13 NOTE — Patient Instructions (Addendum)
   No restrictions for surgery  Hold the fosamax for surgery and until you are healed   Hold the aspirin when the orthopedic doctor tells you to   If any questions or concerns- let us know   Good luck with everything

## 2018-05-13 NOTE — Assessment & Plan Note (Signed)
Prepping for partial knee replacement 6/18

## 2018-05-19 DIAGNOSIS — M1711 Unilateral primary osteoarthritis, right knee: Secondary | ICD-10-CM | POA: Diagnosis present

## 2018-05-19 DIAGNOSIS — M170 Bilateral primary osteoarthritis of knee: Secondary | ICD-10-CM | POA: Diagnosis not present

## 2018-05-19 NOTE — H&P (Signed)
KNEE ARTHROPLASTY ADMISSION H&P  Patient ID: Tara Sloan MRN: [id number] DOB/AGE: Apr 24, 1946 72 y.o.  Chief Complaint: right knee pain.  Planned Procedure Date: 06/08/2018 Medical Clearance by Dr. Glori Bickers Cardiac Clearance by Dr. Rockey Situ  HPI: Tara Sloan is a 72 y.o. female with a history of GERD, B-cell lymphoma, dyslipidemia, and LBBB who presents for evaluation of OA RIGHT KNEE. The patient has a history of pain and functional disability in the right knee due to arthritis and has failed non-surgical conservative treatments for greater than 12 weeks to include NSAID's and/or analgesics, corticosteriod injections and activity modification.  Onset of symptoms was gradual, starting 5 + years ago with gradually worsening course since that time.  Patient currently rates pain at 7 out of 10 with activity. Patient has night pain, worsening of pain with activity and weight bearing and pain that interferes with activities of daily living.  Patient has evidence of periarticular osteophytes and joint space narrowing by imaging studies.  There is no active infection.  Past Medical History:  Diagnosis Date  . Allergic rhinitis   . Arthritis    "hands; probably qwhere" (03/12/2015)  . Basal cell carcinoma ~ 2010   "leg"  . Chronic back pain   . 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 External hemorrhoid   . GERD (gastroesophageal reflux disease)   . History of radiation therapy 03/22/12 - 04/09/12   right oropharynx/neck  . HLD (hyperlipidemia)   . LBBB (left bundle branch block) dx'd 05/2014  . Non Hodgkin's lymphoma ([place])   . Osteopenia    Past Surgical History:  Procedure Laterality Date  . ABDOMINAL HYSTERECTOMY  1988   "partial; for fibroids"  . BASAL CELL CARCINOMA EXCISION Right ~ 2010   "leg"  . BREAST BIOPSY Left 12/03   Negative  . COLONOSCOPY  5/06   Ext. hemms  . DILATION AND CURETTAGE OF UTERUS  1987  . TONSILLECTOMY  11/07/2011   Procedure: TONSILLECTOMY;  Surgeon: Rozetta Nunnery, MD;  Location: [facility];  Service: ENT;  Laterality: N/A;  . TONSILLECTOMY  10/2011   "right was cancerous"  . TUBAL LIGATION  1987   Allergies  Allergen Reactions  . Pnu-Imune [Pneumococcal Polysaccharide Vaccine] Hives   Prior to Admission medications   Medication Sig Start Date End Date Taking? Authorizing Provider  acetaminophen (TYLENOL) 500 MG tablet Take 1,000 mg by mouth every 8 (eight) hours as needed for mild pain or headache.    [provider]  alendronate (FOSAMAX) 70 MG tablet TAKE 1 TABLET BY MOUTH EVERY 7 DAYS ON AN EMPTY STOMACH WITH A FULL GLASS OF WATER 07/31/17   Tower, Wynelle Fanny, MD  aspirin 81 MG tablet Take 81 mg by mouth every evening.     [provider]  atorvastatin (LIPITOR) 80 MG tablet TAKE 1 TABLET (80 MG TOTAL) BY MOUTH DAILY. 03/08/18   Minna Merritts, MD  Calcium-Vitamin D-Vitamin K 500-100-40 MG-UNT-MCG CHEW Chew 2 tablets by mouth daily.     [provider]  Cholecalciferol (VITAMIN D-3) 1000 UNITS CAPS Take 2,000 Units by mouth daily.     [provider]  donepezil (ARICEPT) 5 MG tablet Take 1 tablet (5 mg total) by mouth at bedtime. 02/08/18   Tower, Wynelle Fanny, MD  meloxicam (MOBIC) 7.5 MG tablet Take 1 tablet (7.5 mg total) by mouth daily. Patient taking differently: Take 7.5 mg by mouth daily as needed.  07/14/17  Tower, Wynelle Fanny, MD  Multiple Vitamin (MULTIVITAMIN) tablet Take 1 tablet by mouth daily.      [provider]  omeprazole (PRILOSEC) 20 MG capsule TAKE 1 CAPSULE BY MOUTH DAILY AS NEEDED FOR HEARTBURN 07/31/17   Tower, Wynelle Fanny, MD  prochlorperazine (COMPAZINE) 10 MG tablet Take 1 tablet (10 mg total) by mouth every 6 (six) hours as needed (Nausea or vomiting). 12/19/11 03/15/12  Nobie Putnam, MD   Social History   Socioeconomic History  . Marital status: Married    Spouse name: Not on file  . Number of children: 2  . Years of  education: Not on file  . Highest education level: Not on file  Occupational History  . Occupation: Employed    Comment: [employer], Glass blower/designer  Social Needs  . Financial resource strain: Not on file  . Food insecurity:    Worry: Not on file    Inability: Not on file  . Transportation needs:    Medical: Not on file    Non-medical: Not on file  Tobacco Use  . Smoking status: Former Smoker    Packs/day: 0.25    Years: 10.00    Pack years: 2.50    Types: Cigarettes    Last attempt to quit: 11/04/1974    Years since quitting: 43.5  . Smokeless tobacco: Never Used  Substance and Sexual Activity  . Alcohol use: Yes    Alcohol/week: 0.0 oz    Comment: rare  . Drug use: No  . Sexual activity: Never    Birth control/protection: None  Lifestyle  . Physical activity:    Days per week: Not on file    Minutes per session: Not on file  . Stress: Not on file  Relationships  . Social connections:    Talks on phone: Not on file    Gets together: Not on file    Attends religious service: Not on file    Active member of club or organization: Not on file    Attends meetings of clubs or organizations: Not on file    Relationship status: Not on file  Other Topics Concern  . Not on file  Social History Narrative   Married (husband with a lot of health problems and CAD)      2 children      Exercise-going to [employer]      Employed-plans to retire at 67   Family History  Problem Relation Age of Onset  . Heart attack Father 39  . Heart disease Father   . Stroke Mother 53  . Breast cancer Paternal Aunt   . Cancer Paternal Aunt   . Cancer Paternal Aunt   . Hyperlipidemia Unknown        Family history  . Colon cancer Neg Hx   . Colon polyps Neg Hx     ROS: Currently denies lightheadedness, dizziness, Fever, chills, CP, SOB.   No personal history of DVT, PE, MI, or CVA. No loose teeth or dentures All other systems have been reviewed and were otherwise currently  negative with the exception of those mentioned in the HPI and as above.  Objective: Vitals: Ht: 5'4" Wt: 132 lb Temp: 97.8 BP: 134/79 Pulse: 76 O2 99% on room air.   Physical Exam: General: Alert, NAD.  Antalgic Gait  HEENT: EOMI, Good Neck Extension  Pulm: No increased work of breathing.  Clear B/L A/P w/o crackle or wheeze.  CV: RRR, No m/g/r appreciated  GI: soft, NT,  ND Neuro: Neuro without gross focal deficit.  Sensation intact distally Skin: No lesions in the area of chief complaint MSK/Surgical Site: Right knee w/o redness or effusion.  + Medial JLT. ROM 5-120.  5/5 strength in extension and flexion.  +EHL/FHL.  NVI.  Stable varus and valgus stress.    Imaging Review Plain radiographs demonstrate severe anteromedial osteoarthritis right knee.  Preserved lateral space and good medial opening on stress views.  Preoperative templating of the joint replacement has been completed, documented, and submitted to the Operating Room personnel in order to optimize intra-operative equipment management.  Assessment: OA RIGHT KNEE Principal Problem:   Primary osteoarthritis of right knee Active Problems:   Dyslipidemia   GERD   Osteopenia   History of B-cell lymphoma   Left bundle branch block (LBBB)   Aortic atherosclerosis (HCC)   Plan: Plan for Procedure(s): UNICOMPARTMENTAL RIGHT KNEE  The patient history, physical exam, clinical judgement of the provider and imaging are consistent with end stage degenerative joint disease and unicompartmental joint arthroplasty is deemed medically necessary. The treatment options including medical management, injection therapy, and arthroplasty were discussed at length. The risks and benefits of Procedure(s): UNICOMPARTMENTAL RIGHT KNEE were presented and reviewed.  The risks of nonoperative treatment, versus surgical intervention including but not limited to continued pain, aseptic loosening, stiffness, dislocation/subluxation, infection,  bleeding, nerve injury, blood clots, cardiopulmonary complications, morbidity, mortality, among others were discussed. The patient verbalizes understanding and wishes to proceed with the plan.  Patient is being admitted for inpatient treatment for surgery, pain control, PT, OT, prophylactic antibiotics, VTE prophylaxis, progressive ambulation, ADL's and discharge planning.   Dental prophylaxis discussed and recommended for 2 years postoperatively.   The patient does meet the criteria for TXA which will be used perioperatively.    ASA 81 mg BID will be used postoperatively for DVT prophylaxis in addition to SCDs, and early ambulation.  The patient is planning to be discharged home with home health services ([facility]) in care of her son and husband.   Patient's anticipated LOS is less than 2 midnights, meeting these requirements: - Lives within 1 hour of care - Has a competent adult at home to recover with post-op recover - NO history of  - Chronic pain requiring opiods  - Diabetes  - Coronary Artery Disease  - Heart failure  - Heart attack  - Stroke  - DVT/VTE  - Cardiac arrhythmia  - Respiratory Failure/COPD  - Renal failure  - Anemia  - Advanced Liver disease   Prudencio Burly III, PA-C 05/19/2018 8:27 AM

## 2018-05-24 ENCOUNTER — Other Ambulatory Visit: Payer: Self-pay | Admitting: Family Medicine

## 2018-05-24 NOTE — Telephone Encounter (Signed)
Copied from [place] [id number]. Topic: Quick Communication - Rx Refill/Question >> May 24, 2018  9:41 AM Oliver Pila B wrote: Medication: busPIRone (BUSPAR) 15 MG tablet [[id number]]  DISCONTINUED   Has the patient contacted their pharmacy? Yes.   (Agent: If no, request that the patient contact the pharmacy for the refill.) (Agent: If yes, when and what did the pharmacy advise?)  [facility]  Agent: Please be advised that RX refills may take up to 3 business days. We ask that you follow-up with your pharmacy.

## 2018-05-25 MED ORDER — BUSPIRONE HCL 15 MG PO TABS: 7.5000 mg | ORAL_TABLET | Freq: Two times a day (BID) | ORAL | 3 refills | Status: DC

## 2018-05-25 NOTE — Telephone Encounter (Signed)
Buspar refill  Last OV:01/25/18 Last refill:01/08/18 90 tab/3 refill (expired prescription) CNO:[id number] [facility]acy[id number] [place], [place] [phone] (Phone) [phone] (Fax)

## 2018-05-25 NOTE — Telephone Encounter (Signed)
Per hx med list d/c buspar, course completed.Please advise.

## 2018-05-28 ENCOUNTER — Other Ambulatory Visit: Payer: Self-pay

## 2018-05-28 ENCOUNTER — Encounter (HOSPITAL_COMMUNITY): Payer: Self-pay

## 2018-05-28 ENCOUNTER — Encounter (HOSPITAL_COMMUNITY)
Admission: RE | Admit: 2018-05-28 | Discharge: 2018-05-28 | Disposition: A | Discharge status: Home / Self Care | Payer: Medicare Other | Source: Ambulatory Visit | Attending: Orthopedic Surgery | Admitting: Orthopedic Surgery

## 2018-05-28 DIAGNOSIS — Z01812 Encounter for preprocedural laboratory examination: Secondary | ICD-10-CM | POA: Diagnosis not present

## 2018-05-28 HISTORY — DX: Unilateral primary osteoarthritis, right knee: M17.11

## 2018-05-28 LAB — BASIC METABOLIC PANEL
Anion gap: 10 (ref 5–15)
BUN: 22 mg/dL — ABNORMAL HIGH (ref 6–20)
CO2: 23 mmol/L (ref 22–32)
Calcium: 8.8 mg/dL — ABNORMAL LOW (ref 8.9–10.3)
Chloride: 110 mmol/L (ref 101–111)
Creatinine, Ser: 0.87 mg/dL (ref 0.44–1.00)
GFR calc Af Amer: >60 mL/min (ref >60)
GFR calc non Af Amer: >60 mL/min (ref >60)
Glucose, Bld: 89 mg/dL (ref 65–99)
Potassium: 3.8 mmol/L (ref 3.5–5.1)
Sodium: 143 mmol/L (ref 135–145)

## 2018-05-28 LAB — SURGICAL PCR SCREEN
MRSA, PCR: NEGATIVE
Staphylococcus aureus: NEGATIVE

## 2018-05-28 LAB — CBC
HCT: 39.8 % (ref 36.0–46.0)
Hemoglobin: 12.9 g/dL (ref 12.0–15.0)
MCH: 29.8 pg (ref 26.0–34.0)
MCHC: 32.4 g/dL (ref 30.0–36.0)
MCV: 91.9 fL (ref 78.0–100.0)
Platelets: 251 10*3/uL (ref 150–400)
RBC: 4.33 MIL/uL (ref 3.87–5.11)
RDW: 13.4 % (ref 11.5–15.5)
WBC: 8.1 10*3/uL (ref 4.0–10.5)

## 2018-05-28 NOTE — Pre-Procedure Instructions (Signed)
Tara Sloan  05/28/2018      [id number] - [place], Tara Sloan - [place] Tara Sloan Tara Sloan Tara Sloan Phone: [phone] Fax: Tara Sloan - Tara Sloan Tara Sloan Tara Sloan Phone: [phone] Fax: [phone]    Your procedure is scheduled on Tues., June 08, 2018 from 10:00AM-11:45AM  Report to [facility] Admitting Entrance "A" at 8:00AM  Call this number if you have problems the morning of surgery:  [phone]   Remember:  No food or drinks after midnight on June 17th   Take these medicines the morning of surgery with A SIP OF WATER By 7:00AM: BusPIRone (BUSPAR). If needed Omeprazole (PRILOSEC) and Acetaminophen (TYLENOL)   Follow your doctors instructions regarding your Aspirin.  If no instructions were given by your doctor, then you will need to call the prescribing office office to get instructions.    7 days before surgery (6/11), stop taking all Other Aspirin Products, Vitamins, Fish oils, and Herbal medications. Also stop all NSAIDS i.e. Advil, Ibuprofen, Motrin, Aleve, Anaprox, Naproxen, BC, Goody Powders, and all Supplements.    Do not wear jewelry, make-up, or nail polish (fingers).   Do not wear lotions, powders, perfumes, or deodorant.  Do not shave 48 hours prior to surgery.    Do not bring valuables to the hospital.  [name] is not responsible for any belongings or valuables.  Contacts, dentures or bridgework may not be worn into surgery.  Leave your suitcase in the car.  After surgery it may be brought to your room.  For patients admitted to the hospital, discharge time will be determined by your treatment team.  Patients discharged the day of surgery will not be allowed to drive home.   Special instructions: [name]  Before surgery, you can play an important role. Because skin is not sterile, your skin needs to be as  free of germs as possible. You can reduce the number of germs on your skin by washing with CHG (chlorahexidine gluconate) Soap before surgery.  CHG is an antiseptic cleaner which kills germs and bonds with the skin to continue killing germs even after washing.    Oral Hygiene is also important to reduce your risk of infection.  Remember - BRUSH YOUR TEETH THE MORNING OF SURGERY WITH YOUR REGULAR TOOTHPASTE  Please do not use if you have an allergy to CHG or antibacterial soaps. If your skin becomes reddened/irritated stop using the CHG.  Do not shave (including legs and underarms) for at least 48 hours prior to first CHG shower. It is OK to shave your face.  Please follow these instructions carefully.   1. Shower the NIGHT BEFORE SURGERY and the MORNING OF SURGERY with CHG.   2. If you chose to wash your hair, wash your hair first as usual with your normal shampoo.  3. After you shampoo, rinse your hair and body thoroughly to remove the shampoo.  4. Use CHG as you would any other liquid soap. You can apply CHG directly to the skin and wash gently with a scrungie or a clean washcloth.   5. Apply the CHG Soap to your body ONLY FROM THE NECK DOWN.  Do not use on open wounds or open sores. Avoid contact with your eyes, ears, mouth and genitals (private parts). Wash Face and genitals (private parts)  with your normal soap.  6. Wash thoroughly, paying special attention to  the area where your surgery will be performed.  7. Thoroughly rinse your body with warm water from the neck down.  8. DO NOT shower/wash with your normal soap after using and rinsing off the CHG Soap.  9. Pat yourself dry with a CLEAN TOWEL.  10. Wear CLEAN PAJAMAS to bed the night before surgery, wear comfortable clothes the morning of surgery  11. Place CLEAN SHEETS on your bed the night of your first shower and DO NOT SLEEP WITH PETS.  Day of Surgery:  Do not apply any deodorants/lotions.  Please wear clean clothes to  the hospital/surgery center.   Remember to brush your teeth WITH YOUR REGULAR TOOTHPASTE.  Please read over the following fact sheets that you were given. Pain Booklet, Coughing and Deep Breathing, MRSA Information and Surgical Site Infection Prevention

## 2018-05-28 NOTE — Progress Notes (Signed)
PCP - Dr. Loura Pardon  Cardiologist - Dr. Rockey Situ  Chest x-ray - Denies  EKG - 06/29/17 (E)  Stress Test - 07/13/14 (E)  ECHO - 03/12/15 (E)  Cardiac Cath - Denies  Sleep Study - Denies CPAP - None   LABS- 05/28/18: CBC, BMP  ASA- LD- 6/11  Anesthesia- No  Pt denies having chest pain, sob, or fever at this time. All instructions explained to the pt, with a verbal understanding of the material. Pt agrees to go over the instructions while at home for a better understanding. The opportunity to ask questions was provided.

## 2018-06-07 MED ORDER — TRANEXAMIC ACID 1000 MG/10ML IV SOLN
1000.0000 mg | INTRAVENOUS | Status: AC
  Administered 2018-06-08: 1000 mg via INTRAVENOUS
  Filled 2018-06-07: qty 1100

## 2018-06-08 ENCOUNTER — Encounter (HOSPITAL_COMMUNITY): Payer: Self-pay | Admitting: *Deleted

## 2018-06-08 ENCOUNTER — Encounter (HOSPITAL_COMMUNITY)
Admission: RE | Disposition: A | Discharge status: Home / Self Care | Payer: Self-pay | Source: Ambulatory Visit | Attending: Orthopedic Surgery

## 2018-06-08 ENCOUNTER — Ambulatory Visit (HOSPITAL_COMMUNITY): Payer: Medicare Other | Admitting: Anesthesiology

## 2018-06-08 ENCOUNTER — Observation Stay (HOSPITAL_COMMUNITY): Payer: Medicare Other

## 2018-06-08 ENCOUNTER — Other Ambulatory Visit: Payer: Self-pay

## 2018-06-08 ENCOUNTER — Observation Stay (HOSPITAL_COMMUNITY)
Admission: RE | Admit: 2018-06-08 | Discharge: 2018-06-09 | Disposition: A | Discharge status: Home / Self Care | Payer: Medicare Other | Source: Ambulatory Visit | Attending: Orthopedic Surgery | Admitting: Orthopedic Surgery

## 2018-06-08 DIAGNOSIS — Z79899 Other long term (current) drug therapy: Secondary | ICD-10-CM | POA: Insufficient documentation

## 2018-06-08 DIAGNOSIS — M1711 Unilateral primary osteoarthritis, right knee: Secondary | ICD-10-CM | POA: Diagnosis not present

## 2018-06-08 DIAGNOSIS — E785 Hyperlipidemia, unspecified: Secondary | ICD-10-CM | POA: Diagnosis present

## 2018-06-08 DIAGNOSIS — M8580 Other specified disorders of bone density and structure, unspecified site: Secondary | ICD-10-CM | POA: Insufficient documentation

## 2018-06-08 DIAGNOSIS — K219 Gastro-esophageal reflux disease without esophagitis: Secondary | ICD-10-CM | POA: Diagnosis present

## 2018-06-08 DIAGNOSIS — Z7982 Long term (current) use of aspirin: Secondary | ICD-10-CM | POA: Diagnosis not present

## 2018-06-08 DIAGNOSIS — Z96659 Presence of unspecified artificial knee joint: Secondary | ICD-10-CM

## 2018-06-08 DIAGNOSIS — I700 Atherosclerosis of aorta: Secondary | ICD-10-CM | POA: Diagnosis present

## 2018-06-08 DIAGNOSIS — Z96651 Presence of right artificial knee joint: Secondary | ICD-10-CM | POA: Diagnosis not present

## 2018-06-08 DIAGNOSIS — M1710 Unilateral primary osteoarthritis, unspecified knee: Secondary | ICD-10-CM | POA: Diagnosis present

## 2018-06-08 DIAGNOSIS — Z7983 Long term (current) use of bisphosphonates: Secondary | ICD-10-CM | POA: Insufficient documentation

## 2018-06-08 DIAGNOSIS — Z87891 Personal history of nicotine dependence: Secondary | ICD-10-CM | POA: Insufficient documentation

## 2018-06-08 DIAGNOSIS — Z471 Aftercare following joint replacement surgery: Secondary | ICD-10-CM | POA: Diagnosis not present

## 2018-06-08 DIAGNOSIS — Z8572 Personal history of non-Hodgkin lymphomas: Secondary | ICD-10-CM | POA: Insufficient documentation

## 2018-06-08 DIAGNOSIS — I447 Left bundle-branch block, unspecified: Secondary | ICD-10-CM | POA: Diagnosis present

## 2018-06-08 DIAGNOSIS — G8918 Other acute postprocedural pain: Secondary | ICD-10-CM | POA: Diagnosis not present

## 2018-06-08 DIAGNOSIS — I2510 Atherosclerotic heart disease of native coronary artery without angina pectoris: Secondary | ICD-10-CM | POA: Diagnosis not present

## 2018-06-08 HISTORY — PX: REPLACEMENT UNICONDYLAR JOINT KNEE: SUR1227

## 2018-06-08 HISTORY — PX: PARTIAL KNEE ARTHROPLASTY: SHX2174

## 2018-06-08 SURGERY — ARTHROPLASTY, KNEE, UNICOMPARTMENTAL
Anesthesia: Regional | Site: Knee | Laterality: Right

## 2018-06-08 MED ORDER — CEFAZOLIN SODIUM-DEXTROSE 2-4 GM/100ML-% IV SOLN
2.0000 g | Freq: Four times a day (QID) | INTRAVENOUS | Status: AC
  Administered 2018-06-08 – 2018-06-09 (×2): 2 g via INTRAVENOUS
  Filled 2018-06-08 (×3): qty 100

## 2018-06-08 MED ORDER — OXYCODONE HCL 5 MG PO TABS
5.0000 mg | ORAL_TABLET | ORAL | Status: DC | PRN
  Administered 2018-06-09 (×2): 10 mg via ORAL
  Filled 2018-06-08 (×2): qty 2

## 2018-06-08 MED ORDER — METOCLOPRAMIDE HCL 5 MG/ML IJ SOLN: 5.0000 mg | Freq: Three times a day (TID) | INTRAMUSCULAR | Status: DC | PRN

## 2018-06-08 MED ORDER — FENTANYL CITRATE (PF) 250 MCG/5ML IJ SOLN
INTRAMUSCULAR | Status: AC
  Filled 2018-06-08: qty 5

## 2018-06-08 MED ORDER — ONDANSETRON HCL 4 MG PO TABS: 4.0000 mg | ORAL_TABLET | Freq: Four times a day (QID) | ORAL | Status: DC | PRN

## 2018-06-08 MED ORDER — FENTANYL CITRATE (PF) 100 MCG/2ML IJ SOLN
INTRAMUSCULAR | Status: DC | PRN
  Administered 2018-06-08: 50 ug via INTRAVENOUS

## 2018-06-08 MED ORDER — PANTOPRAZOLE SODIUM 40 MG PO TBEC
40.0000 mg | DELAYED_RELEASE_TABLET | Freq: Every day | ORAL | Status: DC
  Administered 2018-06-09: 40 mg via ORAL
  Filled 2018-06-08: qty 1

## 2018-06-08 MED ORDER — ACETAMINOPHEN 500 MG PO TABS
1000.0000 mg | ORAL_TABLET | Freq: Three times a day (TID) | ORAL | Status: DC
  Administered 2018-06-08 – 2018-06-09 (×3): 1000 mg via ORAL
  Filled 2018-06-08 (×3): qty 2

## 2018-06-08 MED ORDER — METOCLOPRAMIDE HCL 5 MG PO TABS: 5.0000 mg | ORAL_TABLET | Freq: Three times a day (TID) | ORAL | Status: DC | PRN

## 2018-06-08 MED ORDER — HYDROMORPHONE HCL 2 MG/ML IJ SOLN: 0.2500 mg | INTRAMUSCULAR | Status: DC | PRN

## 2018-06-08 MED ORDER — BUPIVACAINE LIPOSOME 1.3 % IJ SUSP
20.0000 mL | Freq: Once | INTRAMUSCULAR | Status: AC
Start: 2018-06-08 — End: 2018-06-08
  Administered 2018-06-08: 20 mL
  Filled 2018-06-08: qty 20

## 2018-06-08 MED ORDER — DEXAMETHASONE SODIUM PHOSPHATE 10 MG/ML IJ SOLN
10.0000 mg | Freq: Once | INTRAMUSCULAR | Status: AC
  Administered 2018-06-09: 10 mg via INTRAVENOUS
  Filled 2018-06-08: qty 1

## 2018-06-08 MED ORDER — METHOCARBAMOL 500 MG PO TABS: 500.0000 mg | ORAL_TABLET | Freq: Four times a day (QID) | ORAL | 0 refills | Status: DC | PRN

## 2018-06-08 MED ORDER — ONDANSETRON HCL 4 MG PO TABS: 4.0000 mg | ORAL_TABLET | Freq: Three times a day (TID) | ORAL | 0 refills | Status: DC | PRN

## 2018-06-08 MED ORDER — METHOCARBAMOL 1000 MG/10ML IJ SOLN
500.0000 mg | Freq: Four times a day (QID) | INTRAVENOUS | Status: DC | PRN
  Filled 2018-06-08: qty 5

## 2018-06-08 MED ORDER — PROMETHAZINE HCL 25 MG/ML IJ SOLN
INTRAMUSCULAR | Status: AC
  Filled 2018-06-08: qty 1

## 2018-06-08 MED ORDER — SODIUM CHLORIDE 0.9 % IR SOLN
Status: DC | PRN
  Administered 2018-06-08: 3000 mL

## 2018-06-08 MED ORDER — PHENYLEPHRINE HCL 10 MG/ML IJ SOLN
INTRAVENOUS | Status: DC | PRN
  Administered 2018-06-08: 25 ug/min via INTRAVENOUS

## 2018-06-08 MED ORDER — DONEPEZIL HCL 5 MG PO TABS
5.0000 mg | ORAL_TABLET | Freq: Every day | ORAL | Status: DC
  Administered 2018-06-08: 5 mg via ORAL
  Filled 2018-06-08: qty 1

## 2018-06-08 MED ORDER — ASPIRIN 81 MG PO CHEW
81.0000 mg | CHEWABLE_TABLET | Freq: Two times a day (BID) | ORAL | Status: DC
  Administered 2018-06-08 – 2018-06-09 (×2): 81 mg via ORAL
  Filled 2018-06-08 (×2): qty 1

## 2018-06-08 MED ORDER — DOCUSATE SODIUM 100 MG PO CAPS
100.0000 mg | ORAL_CAPSULE | Freq: Two times a day (BID) | ORAL | Status: DC
  Administered 2018-06-08 – 2018-06-09 (×2): 100 mg via ORAL
  Filled 2018-06-08 (×2): qty 1

## 2018-06-08 MED ORDER — CELECOXIB 200 MG PO CAPS
200.0000 mg | ORAL_CAPSULE | Freq: Two times a day (BID) | ORAL | Status: DC
  Administered 2018-06-08 – 2018-06-09 (×2): 200 mg via ORAL
  Filled 2018-06-08 (×2): qty 1

## 2018-06-08 MED ORDER — 0.9 % SODIUM CHLORIDE (POUR BTL) OPTIME
TOPICAL | Status: DC | PRN
  Administered 2018-06-08: 1000 mL

## 2018-06-08 MED ORDER — LIDOCAINE 2% (20 MG/ML) 5 ML SYRINGE
INTRAMUSCULAR | Status: AC
  Filled 2018-06-08: qty 5

## 2018-06-08 MED ORDER — ONDANSETRON HCL 4 MG/2ML IJ SOLN
INTRAMUSCULAR | Status: AC
  Filled 2018-06-08: qty 2

## 2018-06-08 MED ORDER — ONDANSETRON HCL 4 MG/2ML IJ SOLN
INTRAMUSCULAR | Status: DC | PRN
  Administered 2018-06-08: 4 mg via INTRAVENOUS

## 2018-06-08 MED ORDER — ATORVASTATIN CALCIUM 80 MG PO TABS
80.0000 mg | ORAL_TABLET | Freq: Every day | ORAL | Status: DC
  Administered 2018-06-08: 80 mg via ORAL
  Filled 2018-06-08: qty 1

## 2018-06-08 MED ORDER — OXYCODONE HCL 5 MG/5ML PO SOLN: 5.0000 mg | Freq: Once | ORAL | Status: AC | PRN

## 2018-06-08 MED ORDER — PROPOFOL 500 MG/50ML IV EMUL
INTRAVENOUS | Status: DC | PRN
  Administered 2018-06-08: 75 ug/kg/min via INTRAVENOUS

## 2018-06-08 MED ORDER — ONDANSETRON HCL 4 MG/2ML IJ SOLN: 4.0000 mg | Freq: Four times a day (QID) | INTRAMUSCULAR | Status: DC | PRN

## 2018-06-08 MED ORDER — OXYCODONE HCL 5 MG PO TABS: 5.0000 mg | ORAL_TABLET | ORAL | 0 refills | Status: AC | PRN

## 2018-06-08 MED ORDER — GABAPENTIN 300 MG PO CAPS
300.0000 mg | ORAL_CAPSULE | Freq: Once | ORAL | Status: AC
  Administered 2018-06-08: 300 mg via ORAL
  Filled 2018-06-08: qty 1

## 2018-06-08 MED ORDER — CELECOXIB 100 MG PO CAPS: 100.0000 mg | ORAL_CAPSULE | Freq: Two times a day (BID) | ORAL | 0 refills | Status: AC

## 2018-06-08 MED ORDER — SODIUM CHLORIDE FLUSH 0.9 % IV SOLN
INTRAVENOUS | Status: DC | PRN
  Administered 2018-06-08: 30 mL

## 2018-06-08 MED ORDER — METHOCARBAMOL 500 MG PO TABS
ORAL_TABLET | ORAL | Status: AC
  Filled 2018-06-08: qty 1

## 2018-06-08 MED ORDER — PROPOFOL 10 MG/ML IV BOLUS
INTRAVENOUS | Status: AC
  Filled 2018-06-08: qty 20

## 2018-06-08 MED ORDER — MIDAZOLAM HCL 2 MG/2ML IJ SOLN
INTRAMUSCULAR | Status: AC
  Filled 2018-06-08: qty 2

## 2018-06-08 MED ORDER — SORBITOL 70 % SOLN: 30.0000 mL | Freq: Every day | Status: DC | PRN

## 2018-06-08 MED ORDER — HYDROMORPHONE HCL 2 MG/ML IJ SOLN
0.5000 mg | INTRAMUSCULAR | Status: DC | PRN
  Administered 2018-06-08: 1 mg via INTRAVENOUS
  Filled 2018-06-08: qty 1

## 2018-06-08 MED ORDER — ACETAMINOPHEN 500 MG PO TABS
1000.0000 mg | ORAL_TABLET | Freq: Once | ORAL | Status: AC
  Administered 2018-06-08: 1000 mg via ORAL
  Filled 2018-06-08: qty 2

## 2018-06-08 MED ORDER — BUSPIRONE HCL 5 MG PO TABS
7.5000 mg | ORAL_TABLET | Freq: Two times a day (BID) | ORAL | Status: DC
  Administered 2018-06-08 – 2018-06-09 (×2): 7.5 mg via ORAL
  Filled 2018-06-08 (×3): qty 2

## 2018-06-08 MED ORDER — POLYETHYLENE GLYCOL 3350 17 G PO PACK: 17.0000 g | PACK | Freq: Every day | ORAL | Status: DC | PRN

## 2018-06-08 MED ORDER — OXYCODONE HCL 5 MG PO TABS
ORAL_TABLET | ORAL | Status: AC
  Filled 2018-06-08: qty 1

## 2018-06-08 MED ORDER — ROPIVACAINE HCL 5 MG/ML IJ SOLN
INTRAMUSCULAR | Status: DC | PRN
  Administered 2018-06-08: 20 mL via PERINEURAL

## 2018-06-08 MED ORDER — PROMETHAZINE HCL 25 MG/ML IJ SOLN
6.2500 mg | INTRAMUSCULAR | Status: DC | PRN
  Administered 2018-06-08: 6.25 mg via INTRAVENOUS

## 2018-06-08 MED ORDER — ACETAMINOPHEN 500 MG PO TABS: 1000.0000 mg | ORAL_TABLET | Freq: Three times a day (TID) | ORAL | 0 refills | Status: AC

## 2018-06-08 MED ORDER — LIDOCAINE 2% (20 MG/ML) 5 ML SYRINGE
INTRAMUSCULAR | Status: DC | PRN
  Administered 2018-06-08: 20 mg via INTRAVENOUS

## 2018-06-08 MED ORDER — METHOCARBAMOL 500 MG PO TABS
500.0000 mg | ORAL_TABLET | Freq: Four times a day (QID) | ORAL | Status: DC | PRN
  Administered 2018-06-08 – 2018-06-09 (×2): 500 mg via ORAL
  Filled 2018-06-08: qty 1

## 2018-06-08 MED ORDER — DOCUSATE SODIUM 100 MG PO CAPS: 100.0000 mg | ORAL_CAPSULE | Freq: Two times a day (BID) | ORAL | 0 refills | Status: DC

## 2018-06-08 MED ORDER — CHLORHEXIDINE GLUCONATE 4 % EX LIQD: 60.0000 mL | Freq: Once | CUTANEOUS | Status: DC

## 2018-06-08 MED ORDER — OXYCODONE HCL 5 MG PO TABS
5.0000 mg | ORAL_TABLET | Freq: Once | ORAL | Status: AC | PRN
  Administered 2018-06-08: 5 mg via ORAL

## 2018-06-08 MED ORDER — POVIDONE-IODINE 10 % EX SWAB: 2.0000 "application " | Freq: Once | CUTANEOUS | Status: DC

## 2018-06-08 MED ORDER — LACTATED RINGERS IV SOLN: INTRAVENOUS | Status: DC

## 2018-06-08 MED ORDER — FENTANYL CITRATE (PF) 100 MCG/2ML IJ SOLN
INTRAMUSCULAR | Status: AC
  Administered 2018-06-08: 50 ug
  Filled 2018-06-08: qty 2

## 2018-06-08 MED ORDER — MAGNESIUM CITRATE PO SOLN: 1.0000 | Freq: Once | ORAL | Status: DC | PRN

## 2018-06-08 MED ORDER — BUPIVACAINE IN DEXTROSE 0.75-8.25 % IT SOLN
INTRATHECAL | Status: DC | PRN
  Administered 2018-06-08: 2 mL via INTRATHECAL

## 2018-06-08 MED ORDER — CEFAZOLIN SODIUM-DEXTROSE 2-4 GM/100ML-% IV SOLN
2.0000 g | INTRAVENOUS | Status: AC
  Administered 2018-06-08: 2 g via INTRAVENOUS
  Filled 2018-06-08: qty 100

## 2018-06-08 MED ORDER — LACTATED RINGERS IV SOLN
INTRAVENOUS | Status: DC
  Administered 2018-06-08: 23:00:00 via INTRAVENOUS

## 2018-06-08 MED ORDER — DIPHENHYDRAMINE HCL 12.5 MG/5ML PO ELIX: 12.5000 mg | ORAL_SOLUTION | ORAL | Status: DC | PRN

## 2018-06-08 MED ORDER — PHENOL 1.4 % MT LIQD: 1.0000 | OROMUCOSAL | Status: DC | PRN

## 2018-06-08 MED ORDER — ASPIRIN EC 81 MG PO TBEC: 81.0000 mg | DELAYED_RELEASE_TABLET | Freq: Two times a day (BID) | ORAL | 0 refills | Status: AC

## 2018-06-08 MED ORDER — MIDAZOLAM HCL 2 MG/2ML IJ SOLN
INTRAMUSCULAR | Status: AC
  Administered 2018-06-08: 2 mg
  Filled 2018-06-08: qty 2

## 2018-06-08 MED ORDER — LACTATED RINGERS IV SOLN
INTRAVENOUS | Status: DC
  Administered 2018-06-08 (×2): via INTRAVENOUS

## 2018-06-08 MED ORDER — ACETAMINOPHEN 325 MG PO TABS: 325.0000 mg | ORAL_TABLET | Freq: Four times a day (QID) | ORAL | Status: DC | PRN

## 2018-06-08 MED ORDER — MENTHOL 3 MG MT LOZG: 1.0000 | LOZENGE | OROMUCOSAL | Status: DC | PRN

## 2018-06-08 SURGICAL SUPPLY — 52 items
BANDAGE ELASTIC 6 VELCRO ST LF (GAUZE/BANDAGES/DRESSINGS) ×2 IMPLANT
BANDAGE ESMARK 6X9 LF (GAUZE/BANDAGES/DRESSINGS) ×1 IMPLANT
BNDG ESMARK 6X9 LF (GAUZE/BANDAGES/DRESSINGS) ×2
BOWL SMART MIX CTS (DISPOSABLE) ×2 IMPLANT
CEMENT BONE R 1X40 (Cement) ×2 IMPLANT
CHLORAPREP W/TINT 26ML (MISCELLANEOUS) ×2 IMPLANT
CLSR STERI-STRIP ANTIMIC 1/2X4 (GAUZE/BANDAGES/DRESSINGS) ×2 IMPLANT
COVER SURGICAL LIGHT HANDLE (MISCELLANEOUS) ×2 IMPLANT
CUFF TOURNIQUET SINGLE 34IN LL (TOURNIQUET CUFF) ×2 IMPLANT
DRAPE ARTHROSCOPY W/POUCH 114 (DRAPES) ×2 IMPLANT
DRAPE U-SHAPE 47X51 STRL (DRAPES) ×2 IMPLANT
DRSG MEPILEX BORDER 4X4 (GAUZE/BANDAGES/DRESSINGS) IMPLANT
DRSG MEPILEX BORDER 4X8 (GAUZE/BANDAGES/DRESSINGS) ×2 IMPLANT
ELECT CAUTERY BLADE 6.4 (BLADE) ×2 IMPLANT
ELECT REM PT RETURN 9FT ADLT (ELECTROSURGICAL) ×2
ELECTRODE REM PT RTRN 9FT ADLT (ELECTROSURGICAL) ×1 IMPLANT
FACESHIELD WRAPAROUND (MASK) ×4 IMPLANT
GLOVE BIO SURGEON STRL SZ7.5 (GLOVE) ×4 IMPLANT
GLOVE BIOGEL PI IND STRL 8 (GLOVE) ×2 IMPLANT
GLOVE BIOGEL PI INDICATOR 8 (GLOVE) ×2
GLOVE SURG SS PI 7.5 STRL IVOR (GLOVE) ×2 IMPLANT
GOWN STRL REUS W/ TWL LRG LVL3 (GOWN DISPOSABLE) ×2 IMPLANT
GOWN STRL REUS W/ TWL XL LVL3 (GOWN DISPOSABLE) ×1 IMPLANT
GOWN STRL REUS W/TWL LRG LVL3 (GOWN DISPOSABLE) ×2
GOWN STRL REUS W/TWL XL LVL3 (GOWN DISPOSABLE) ×1
HANDPIECE INTERPULSE COAX TIP (DISPOSABLE) ×1
IMMOBILIZER KNEE 22 UNIV (SOFTGOODS) ×2 IMPLANT
KIT BASIN OR (CUSTOM PROCEDURE TRAY) ×2 IMPLANT
KIT TURNOVER KIT B (KITS) ×2 IMPLANT
KNEE OXFORD UNI MENISCAL (Joint) ×2 IMPLANT
MANIFOLD NEPTUNE II (INSTRUMENTS) ×2 IMPLANT
NS IRRIG 1000ML POUR BTL (IV SOLUTION) ×2 IMPLANT
PACK BLADE SAW RECIP 70 3 PT (BLADE) ×2 IMPLANT
PACK TOTAL JOINT (CUSTOM PROCEDURE TRAY) ×2 IMPLANT
PAD ARMBOARD 7.5X6 YLW CONV (MISCELLANEOUS) ×2 IMPLANT
PEG FEMORAL PEGGED STRL SM (Knees) ×2 IMPLANT
SET HNDPC FAN SPRY TIP SCT (DISPOSABLE) ×1 IMPLANT
STAPLER VISISTAT 35W (STAPLE) IMPLANT
SUCTION FRAZIER HANDLE 10FR (MISCELLANEOUS) ×1
SUCTION TUBE FRAZIER 10FR DISP (MISCELLANEOUS) ×1 IMPLANT
SUT MNCRL AB 4-0 PS2 18 (SUTURE) ×2 IMPLANT
SUT MON AB 2-0 CT1 27 (SUTURE) ×2 IMPLANT
SUT VIC AB 0 CT1 27 (SUTURE) ×2
SUT VIC AB 0 CT1 27XBRD ANBCTR (SUTURE) ×1 IMPLANT
SUT VIC AB 0 CT1 27XBRD ANTBC (SUTURE) ×1 IMPLANT
SUT VIC AB 1 CT1 27 (SUTURE) ×2
SUT VIC AB 1 CT1 27XBRD ANTBC (SUTURE) ×2 IMPLANT
SUT VIC AB 2-0 CT1 27 (SUTURE) ×1
SUT VIC AB 2-0 CT1 TAPERPNT 27 (SUTURE) ×1 IMPLANT
TOWEL OR 17X26 10 PK STRL BLUE (TOWEL DISPOSABLE) ×2 IMPLANT
TRAY TIBIAL OXFORD SZ C RT (Joint) ×2 IMPLANT
TRAY URETHRAL FOLEY CATH 14FR (CATHETERS) ×2 IMPLANT

## 2018-06-08 NOTE — Discharge Instructions (Signed)
You may bear weight as tolerated. °Keep your dressing on and dry until follow up. °Take medicine to prevent blood clots as directed. °Take pain medicine as needed with the goal of transitioning to over the counter medicines.  °If needed, you may increase pain medication for the first few days post up to 2 tablets every 4 hours. ° °INSTRUCTIONS AFTER JOINT REPLACEMENT  ° °o Remove items at home which could result in a fall. This includes throw rugs or furniture in walking pathways °o ICE to the affected joint every three hours while awake for 30 minutes at a time, for at least the first 3-5 days, and then as needed for pain and swelling.  Continue to use ice for pain and swelling. You may notice swelling that will progress down to the foot and ankle.  This is normal after surgery.  Elevate your leg when you are not up walking on it.   °o Continue to use the breathing machine you got in the hospital (incentive spirometer) which will help keep your temperature down.  It is common for your temperature to cycle up and down following surgery, especially at night when you are not up moving around and exerting yourself.  The breathing machine keeps your lungs expanded and your temperature down. ° ° °DIET:  As you were doing prior to hospitalization, we recommend a well-balanced diet. ° °DRESSING / WOUND CARE / SHOWERING ° °You may shower 3 days after surgery, but keep the wounds dry during showering.  You may use an occlusive plastic wrap (Press'n Seal for example) with blue painter's tape at edges, NO SOAKING/SUBMERGING IN THE BATHTUB.  If the bandage gets wet, change with a clean dry gauze.  If the incision gets wet, pat the wound dry with a clean towel. ° °ACTIVITY ° °o Increase activity slowly as tolerated, but follow the weight bearing instructions below.   °o No driving for 6 weeks or until further direction given by your physician.  You cannot drive while taking narcotics.  °o No lifting or carrying greater than 10  lbs. until further directed by your surgeon. °o Avoid periods of inactivity such as sitting longer than an hour when not asleep. This helps prevent blood clots.  °o You may return to work once you are authorized by your doctor.  ° ° ° °WEIGHT BEARING  ° °Weight bearing as tolerated with assist device (walker, cane, etc) as directed, use it as long as suggested by your surgeon or therapist, typically at least 4-6 weeks. ° ° °EXERCISES ° °Results after joint replacement surgery are often greatly improved when you follow the exercise, range of motion and muscle strengthening exercises prescribed by your doctor. Safety measures are also important to protect the joint from further injury. Any time any of these exercises cause you to have increased pain or swelling, decrease what you are doing until you are comfortable again and then slowly increase them. If you have problems or questions, call your caregiver or physical therapist for advice.  ° °Rehabilitation is important following a joint replacement. After just a few days of immobilization, the muscles of the leg can become weakened and shrink (atrophy).  These exercises are designed to build up the tone and strength of the thigh and leg muscles and to improve motion. Often times heat used for twenty to thirty minutes before working out will loosen up your tissues and help with improving the range of motion but do not use heat for the first two   weeks following surgery (sometimes heat can increase post-operative swelling).  ° °These exercises can be done on a training (exercise) mat, on the floor, on a table or on a bed. Use whatever works the best and is most comfortable for you.    Use music or television while you are exercising so that the exercises are a pleasant break in your day. This will make your life better with the exercises acting as a break in your routine that you can look forward to.   Perform all exercises about fifteen times, three times per day or as  directed.  You should exercise both the operative leg and the other leg as well. ° °Exercises include: °  °• Quad Sets - Tighten up the muscle on the front of the thigh (Quad) and hold for 5-10 seconds.   °• Straight Leg Raises - With your knee straight (if you were given a brace, keep it on), lift the leg to 60 degrees, hold for 3 seconds, and slowly lower the leg.  Perform this exercise against resistance later as your leg gets stronger.  °• Leg Slides: Lying on your back, slowly slide your foot toward your buttocks, bending your knee up off the floor (only go as far as is comfortable). Then slowly slide your foot back down until your leg is flat on the floor again.  °• Angel Wings: Lying on your back spread your legs to the side as far apart as you can without causing discomfort.  °• Hamstring Strength:  Lying on your back, push your heel against the floor with your leg straight by tightening up the muscles of your buttocks.  Repeat, but this time bend your knee to a comfortable angle, and push your heel against the floor.  You may put a pillow under the heel to make it more comfortable if necessary.  ° °A rehabilitation program following joint replacement surgery can speed recovery and prevent re-injury in the future due to weakened muscles. Contact your doctor or a physical therapist for more information on knee rehabilitation.  ° ° °CONSTIPATION ° °Constipation is defined medically as fewer than three stools per week and severe constipation as less than one stool per week.  Even if you have a regular bowel pattern at home, your normal regimen is likely to be disrupted due to multiple reasons following surgery.  Combination of anesthesia, postoperative narcotics, change in appetite and fluid intake all can affect your bowels.  ° °YOU MUST use at least one of the following options; they are listed in order of increasing strength to get the job done.  They are all available over the counter, and you may need to  use some, POSSIBLY even all of these options:   ° °Drink plenty of fluids (prune juice may be helpful) and high fiber foods °Colace 100 mg by mouth twice a day  °Senokot for constipation as directed and as needed Dulcolax (bisacodyl), take with full glass of water  °Miralax (polyethylene glycol) once or twice a day as needed. ° °If you have tried all these things and are unable to have a bowel movement in the first 3-4 days after surgery call either your surgeon or your primary doctor.   ° °If you experience loose stools or diarrhea, hold the medications until you stool forms back up.  If your symptoms do not get better within 1 week or if they get worse, check with your doctor.  If you experience "the worst abdominal pain ever" or   develop nausea or vomiting, please contact the office immediately for further recommendations for treatment. ° ° °ITCHING:  If you experience itching with your medications, try taking only a single pain pill, or even half a pain pill at a time.  You can also use Benadryl over the counter for itching or also to help with sleep.  ° °TED HOSE STOCKINGS:  Use stockings on both legs until for at least 2 weeks or as directed by physician office. They may be removed at night for sleeping. ° °MEDICATIONS:  See your medication summary on the “After Visit Summary” that nursing will review with you.  You may have some home medications which will be placed on hold until you complete the course of blood thinner medication.  It is important for you to complete the blood thinner medication as prescribed. ° °PRECAUTIONS:  If you experience chest pain or shortness of breath - call 911 immediately for transfer to the hospital emergency department.  ° °If you develop a fever greater that 101 F, purulent drainage from wound, increased redness or drainage from wound, foul odor from the wound/dressing, or calf pain - CONTACT YOUR SURGEON.   °                                                °FOLLOW-UP  APPOINTMENTS:  If you do not already have a post-op appointment, please call the office for an appointment to be seen by your surgeon.  Guidelines for how soon to be seen are listed in your “After Visit Summary”, but are typically between 1-4 weeks after surgery. ° °OTHER INSTRUCTIONS:  ° ° ° °MAKE SURE YOU:  °• Understand these instructions.  °• Get help right away if you are not doing well or get worse.  ° ° °Thank you for letting us be a part of your medical care team.  It is a privilege we respect greatly.  We hope these instructions will help you stay on track for a fast and full recovery!  ° ° ° ° °

## 2018-06-08 NOTE — Anesthesia Procedure Notes (Signed)
Spinal  Patient location during procedure: OR Start time: 06/08/2018 12:52 PM End time: 06/08/2018 12:57 PM Staffing Anesthesiologist: Lynda Rainwater, MD Performed: anesthesiologist  Preanesthetic Checklist Completed: patient identified, site marked, surgical consent, pre-op evaluation, timeout performed, IV checked, risks and benefits discussed and monitors and equipment checked Spinal Block Patient position: sitting Prep: ChloraPrep Patient monitoring: heart rate, cardiac monitor, continuous pulse ox and blood pressure Approach: midline Location: L3-4 Injection technique: single-shot Needle Needle type: Pencan  Needle gauge: 24 G Needle length: 9 cm

## 2018-06-08 NOTE — Interval H&P Note (Signed)
History and Physical Interval Note:  06/08/2018 8:51 AM  Tara Sloan  has presented today for surgery, with the diagnosis of OA RIGHT KNEE  The various methods of treatment have been discussed with the patient and family. After consideration of risks, benefits and other options for treatment, the patient has consented to  Procedure(s): UNICOMPARTMENTAL RIGHT KNEE (Right) as a surgical intervention .  The patient's history has been reviewed, patient examined, no change in status, stable for surgery.  I have reviewed the patient's chart and labs.  Questions were answered to the patient's satisfaction.     [name] D

## 2018-06-08 NOTE — Progress Notes (Signed)
Patient transferred from PACU at this time. Patient is alert and oriented x 4. Patient stated that pain level is 2 out of 10. VS is stable. Patient is resting and call light is within reach. Will monitor

## 2018-06-08 NOTE — Progress Notes (Signed)
Orthopedic Tech Progress Note Patient Details:  Tara Sloan 1946-09-30 [id number]  CPM Right Knee CPM Right Knee: On Right Knee Flexion (Degrees): 90 Right Knee Extension (Degrees): 0  Post Interventions Patient Tolerated: Well Instructions Provided: Care of device Ortho Devices Type of Ortho Device: Bone foam zero knee Ortho Device/Splint Interventions: Ordered, Application, Adjustment   Post Interventions Patient Tolerated: Well Instructions Provided: Care of device   Braulio Bosch 06/08/2018, 4:37 PM

## 2018-06-08 NOTE — Anesthesia Procedure Notes (Signed)
Procedure Name: MAC Date/Time: 06/08/2018 12:58 PM Performed by: Colin Benton, CRNA Pre-anesthesia Checklist: Patient identified, Emergency Drugs available, Suction available and Patient being monitored Patient Re-evaluated:Patient Re-evaluated prior to induction Oxygen Delivery Method: Simple face mask Induction Type: IV induction Placement Confirmation: positive ETCO2

## 2018-06-08 NOTE — Anesthesia Preprocedure Evaluation (Signed)
Anesthesia Evaluation  Patient identified by MRN, date of birth, ID band Patient awake    Reviewed: Allergy & Precautions, H&P , Patient's Chart, lab work & pertinent test results  Airway Mallampati: I  TM Distance: >3 FB Neck ROM: Full    Dental no notable dental hx. (+) Teeth Intact   Pulmonary former smoker,    Pulmonary exam normal breath sounds clear to auscultation       Cardiovascular + CAD  Normal cardiovascular exam Rhythm:Regular Rate:Normal     Neuro/Psych Anxiety    GI/Hepatic GERD  ,  Endo/Other    Renal/GU      Musculoskeletal  (+) Arthritis ,   Abdominal   Peds  Hematology   Anesthesia Other Findings   Reproductive/Obstetrics                             Anesthesia Physical  Anesthesia Plan  ASA: III  Anesthesia Plan: Spinal and Regional   Post-op Pain Management:  Regional for Post-op pain   Induction: Intravenous  PONV Risk Score and Plan: 2 and Ondansetron, Treatment may vary due to age or medical condition and Propofol infusion  Airway Management Planned: Simple Face Mask  Additional Equipment:   Intra-op Plan:   Post-operative Plan:   Informed Consent: I have reviewed the patients History and Physical, chart, labs and discussed the procedure including the risks, benefits and alternatives for the proposed anesthesia with the patient or authorized representative who has indicated his/her understanding and acceptance.   Dental advisory given  Plan Discussed with:   Anesthesia Plan Comments:         Anesthesia Quick Evaluation

## 2018-06-08 NOTE — Transfer of Care (Signed)
Immediate Anesthesia Transfer of Care Note  Patient: Tara Sloan  Procedure(s) Performed: UNICOMPARTMENTAL RIGHT KNEE (Right Knee)  Patient Location: PACU  Anesthesia Type:Spinal and MAC combined with regional for post-op pain  Level of Consciousness: awake, alert  and oriented  Airway & Oxygen Therapy: Patient Spontanous Breathing and Patient connected to face mask oxygen  Post-op Assessment: Report given to RN and Post -op Vital signs reviewed and stable  Post vital signs: Reviewed and stable  Last Vitals:  Vitals Value Taken Time  BP 100/66 06/08/2018  2:59 PM  Temp    Pulse 70 06/08/2018  3:00 PM  Resp 12 06/08/2018  3:00 PM  SpO2 97 % 06/08/2018  3:00 PM  Vitals shown include unvalidated device data.  Last Pain:  Vitals:   06/08/18 0846  TempSrc:   PainSc: 0-No pain      Patients Stated Pain Goal: 3 (15/05/69 7948)  Complications: No apparent anesthesia complications

## 2018-06-08 NOTE — Op Note (Signed)
06/08/2018  2:06 PM  PATIENT:  Tara Sloan    PRE-OPERATIVE DIAGNOSIS:  OA RIGHT KNEE  POST-OPERATIVE DIAGNOSIS:  Same  PROCEDURE:  UNICOMPARTMENTAL RIGHT KNEE  SURGEON:  MURPHY, Ernesta Amble, MD  PHYSICIAN ASSISTANT: Roxan Hockey, PA-C, he was present and scrubbed throughout the case, critical for completion in a timely fashion, and for retraction, instrumentation, and closure.   ANESTHESIA:   General  PREOPERATIVE INDICATIONS:  Tara Sloan is a  72 y.o. female with a diagnosis of OA RIGHT KNEE who failed conservative measures and elected for surgical management.    The risks benefits and alternatives were discussed with the patient preoperatively including but not limited to the risks of infection, bleeding, nerve injury, cardiopulmonary complications, blood clots, the need for revision surgery, among others, and the patient was willing to proceed.  OPERATIVE IMPLANTS: Biomet Oxford mobile bearing medial compartment arthroplasty. Femoral Component: small. Tibial tray: C, Size 3 poly.   OPERATIVE FINDINGS: Endstage grade 4 medial compartment osteoarthritis. No significant changes in the lateral or patellofemoral joint  OPERATIVE PROCEDURE: The patient was brought to the operating room placed in supine position. General anesthesia was administered. IV antibiotics were given. The lower extremity was placed in the legholder and prepped and draped in usual sterile fashion.  Time out was performed.  The leg was elevated and exsanguinated and the tourniquet was inflated. Anteromedial incision was performed, and I took care to preserve the MCL. Parapatellar incision was carried out, and the osteophytes were excised, along with the medial meniscus and a small portion of the fat pad.  The extra medullary tibial cutting jig was applied, using the spoon and the 68mm G-Clamp, and I took care to protect the anterior cruciate ligament insertion and the tibial spine. The medial  collateral ligament was also protected, and I resected my proximal tibia, matching the anatomic slope.   The proximal tibial bony cut was removed in one piece, and I turned my attention to the femur.  The intramedullary femoral rod was placed using the drill, and then using the appropriate reference, I assembled the femoral jig, setting my posterior cutting block. I resected my posterior femur, and then measured my gap.   I then used the mill to match the extension gap to the flexion gap. The gaps were then measured again with the appropriate feeler gauges. Once I had balanced flexion and extension gaps, I then completed the preparation of the femur.  I milled off the anterior aspect of the distal femur to prevent impingement. I also exposed the tibia, and selected the above-named component, and then used the cutting jig to prepare the keel slot on the tibia. I also used the awl to curette out the bone to complete the preparation of the keel. The back wall was intact.  I then placed trial components, and it was found to have excellent motion, and appropriate balance.  I then cemented the components into place, cementing the tibia first, removing all excess cement, and then cementing the femur.  All loose cement was removed.  The real polyethylene insert was applied manually, and the knee was taken through functional range of motion, and found to have excellent stability and restoration of joint motion, with excellent balance.  The wounds were irrigated copiously, and the parapatellar tissue closed with Vicryl, followed by Vicryl for the subcutaneous tissue, with routine closure with Steri-Strips and sterile gauze.  The tourniquet was released, and the patient was awakened and extubated and returned to PACU  in stable and satisfactory condition. There were no complications.  POSTOPERATIVE PLAN: DVT px will consist of SCD's, mobiliation and chemical px, WBAT     Renette Butters, MD

## 2018-06-08 NOTE — Anesthesia Procedure Notes (Signed)
Anesthesia Regional Block: Adductor canal block   Pre-Anesthetic Checklist: ,, timeout performed, Correct Patient, Correct Site, Correct Laterality, Correct Procedure, Correct Position, site marked, Risks and benefits discussed,  Surgical consent,  Pre-op evaluation,  At surgeon's request and post-op pain management  Laterality: Right  Prep: chloraprep       Needles:  Injection technique: Single-shot  Needle Type: Stimiplex     Needle Length: 9cm  Needle Gauge: 21     Additional Needles:   Procedures:,,,, ultrasound used (permanent image in chart),,,,  Narrative:  Start time: 06/08/2018 10:55 AM End time: 06/08/2018 11:00 AM Injection made incrementally with aspirations every 5 mL.  Performed by: Personally  Anesthesiologist: Lynda Rainwater, MD

## 2018-06-09 ENCOUNTER — Encounter (HOSPITAL_COMMUNITY): Payer: Self-pay | Admitting: Orthopedic Surgery

## 2018-06-09 DIAGNOSIS — E785 Hyperlipidemia, unspecified: Secondary | ICD-10-CM | POA: Diagnosis not present

## 2018-06-09 DIAGNOSIS — M1711 Unilateral primary osteoarthritis, right knee: Secondary | ICD-10-CM | POA: Diagnosis not present

## 2018-06-09 DIAGNOSIS — M8580 Other specified disorders of bone density and structure, unspecified site: Secondary | ICD-10-CM | POA: Diagnosis not present

## 2018-06-09 DIAGNOSIS — Z79899 Other long term (current) drug therapy: Secondary | ICD-10-CM | POA: Diagnosis not present

## 2018-06-09 DIAGNOSIS — K219 Gastro-esophageal reflux disease without esophagitis: Secondary | ICD-10-CM | POA: Diagnosis not present

## 2018-06-09 DIAGNOSIS — Z87891 Personal history of nicotine dependence: Secondary | ICD-10-CM | POA: Diagnosis not present

## 2018-06-09 DIAGNOSIS — Z7982 Long term (current) use of aspirin: Secondary | ICD-10-CM | POA: Diagnosis not present

## 2018-06-09 DIAGNOSIS — Z7983 Long term (current) use of bisphosphonates: Secondary | ICD-10-CM | POA: Diagnosis not present

## 2018-06-09 DIAGNOSIS — I700 Atherosclerosis of aorta: Secondary | ICD-10-CM | POA: Diagnosis not present

## 2018-06-09 DIAGNOSIS — Z8572 Personal history of non-Hodgkin lymphomas: Secondary | ICD-10-CM | POA: Diagnosis not present

## 2018-06-09 NOTE — Progress Notes (Signed)
Physical Therapy Treatment Patient Details Name: Tara Sloan MRN: [id number] DOB: August 22, 1946 Today's Date: 06/09/2018    History of Present Illness Pt is a 72 y.o. female s/p R Unicompartmental Knee. PMHx: Arthritis, Basal cell cercinoma, Chronic back pain, Diffuse large B cell lymphoma, GERD, HLD, L BBB, Non Hodgkin's lymphoma, Osteopenia.     PT Comments    Patient seen for additional session in order to progress gait training, review HEP, and negotiate steps in preparation for discharge home. Patient climbed 5 steps with use of right railing with supervision. Demonstrates overall good pain control and is progressing well towards physical therapy goals. Patient is an excellent candidate for outpatient PT in order to address deficits.    Follow Up Recommendations  Follow surgeon's recommendation for DC plan and follow-up therapies     Equipment Recommendations  Rolling walker with 5" wheels    Recommendations for Other Services       Precautions / Restrictions Precautions Precautions: Knee Precaution Booklet Issued: No Precaution Comments: Educated pt on no pillow under knee and use of zero degree knee Restrictions Weight Bearing Restrictions: Yes RLE Weight Bearing: Weight bearing as tolerated    Mobility  Bed Mobility Overal bed mobility: Modified Independent                Transfers Overall transfer level: Needs assistance Equipment used: Rolling walker (2 wheeled);None Transfers: Sit to/from Stand Sit to Stand: Supervision         General transfer comment: Supervision for safety  Ambulation/Gait Ambulation/Gait assistance: Supervision Gait Distance (Feet): 410 Feet Assistive device: Rolling walker (2 wheeled) Gait Pattern/deviations: Step-through pattern;Decreased step length - left;Decreased stance time - right     General Gait Details: Cueing for decreased RLE circumduction and increased right knee flexion for swing  through   Stairs Stairs: Yes Stairs assistance: Supervision Stair Management: One rail Right Number of Stairs: 5 General stair comments: cued on sequencing pattern and step by step   Wheelchair Mobility    Modified Rankin (Stroke Patients Only)       Balance Overall balance assessment: Needs assistance Sitting-balance support: Feet supported;No upper extremity supported Sitting balance-Leahy Scale: Good     Standing balance support: Bilateral upper extremity supported Standing balance-Leahy Scale: Good                              Cognition Arousal/Alertness: Awake/alert Behavior During Therapy: WFL for tasks assessed/performed Overall Cognitive Status: Within Functional Limits for tasks assessed                                        Exercises Other Exercises Other Exercises: reviewed HEP including supine hip abduction Other Exercises: reviewed activity recommendations/pacing     General Comments        Pertinent Vitals/Pain Pain Assessment: 0-10 Pain Score: 2  Pain Location: R knee Pain Descriptors / Indicators: Discomfort;Sore Pain Intervention(s): Monitored during session    Home Living                      Prior Function            PT Goals (current goals can now be found in the care plan section) Acute Rehab PT Goals Patient Stated Goal: return home PT Goal Formulation: With patient Time For Goal Achievement: 06/11/18 Potential to Achieve  Goals: Good Progress towards PT goals: Progressing toward goals    Frequency    7X/week      PT Plan Current plan remains appropriate    Co-evaluation              AM-PAC PT "6 Clicks" Daily Activity  Outcome Measure  Difficulty turning over in bed (including adjusting bedclothes, sheets and blankets)?: None Difficulty moving from lying on back to sitting on the side of the bed? : None Difficulty sitting down on and standing up from a chair with arms  (e.g., wheelchair, bedside commode, etc,.)?: A Little Help needed moving to and from a bed to chair (including a wheelchair)?: A Little Help needed walking in hospital room?: A Little Help needed climbing 3-5 steps with a railing? : A Little 6 Click Score: 20    End of Session Equipment Utilized During Treatment: Gait belt Activity Tolerance: Patient tolerated treatment well Patient left: with call bell/phone within reach;in bed Nurse Communication: Mobility status PT Visit Diagnosis: Unsteadiness on feet (R26.81);Pain Pain - Right/Left: Right Pain - part of body: Knee     Time: 1445-1520 PT Time Calculation (min) (ACUTE ONLY): 35 min  Charges:  $Gait Training: 8-22 mins $Therapeutic Activity: 8-22 mins                    G Codes:       Ellamae Sia, PT, DPT Acute Rehabilitation Services  Pager: [phone]    Willy Eddy 06/09/2018, 3:36 PM

## 2018-06-09 NOTE — Evaluation (Signed)
Physical Therapy Evaluation Patient Details Name: Tara Sloan MRN: [id number] DOB: 10/26/1946 Today's Date: 06/09/2018   History of Present Illness  Pt is a 72 y.o. female s/p R Unicompartmental Knee. PMHx: Arthritis, Basal cell cercinoma, Chronic back pain, Diffuse large B cell lymphoma, GERD, HLD, L BBB, Non Hodgkin's lymphoma, Osteopenia.   Clinical Impression  Pt is s/p TKA resulting in the deficits listed below (see PT Problem List). On PT evaluation, patient requiring supervision with functional mobility. Main deficits include right knee pain, decreased range of motion, gait abnormalities, and functional strength deficits. Ambulating 270 feet with RW and supervision. Suspect patient will progress well based on PLOF and current status. Goniometric ROM seated: 0 degrees extension - 92 degrees flexion. Will need stair training prior to discharge. Pt will benefit from skilled PT to increase their independence and safety with mobility to allow discharge to the venue listed below.      Follow Up Recommendations Follow surgeon's recommendation for DC plan and follow-up therapies    Equipment Recommendations  Rolling walker with 5" wheels    Recommendations for Other Services       Precautions / Restrictions Precautions Precautions: Knee Precaution Booklet Issued: No Precaution Comments: Educated pt on no pillow under knee and use of zero degree knee Restrictions Weight Bearing Restrictions: Yes RLE Weight Bearing: Weight bearing as tolerated      Mobility  Bed Mobility Overal bed mobility: Needs Assistance Bed Mobility: Supine to Sit     Supine to sit: Supervision;HOB elevated     General bed mobility comments: OOB in recliner  Transfers Overall transfer level: Needs assistance Equipment used: Rolling walker (2 wheeled) Transfers: Sit to/from Stand Sit to Stand: Supervision         General transfer comment: Supervision for  safety  Ambulation/Gait Ambulation/Gait assistance: Supervision Gait Distance (Feet): 270 Feet Assistive device: Rolling walker (2 wheeled) Gait Pattern/deviations: Step-through pattern;Decreased step length - left;Decreased stance time - right     General Gait Details: Cueing for quadricep contraction at initial contact for knee extension as well as decreased right step length.   Stairs            Wheelchair Mobility    Modified Rankin (Stroke Patients Only)       Balance Overall balance assessment: Needs assistance Sitting-balance support: Feet supported;No upper extremity supported Sitting balance-Leahy Scale: Good     Standing balance support: Bilateral upper extremity supported Standing balance-Leahy Scale: Fair Standing balance comment: RW for support                             Pertinent Vitals/Pain Pain Assessment: 0-10 Pain Score: 8  Pain Location: R knee Pain Descriptors / Indicators: Discomfort;Sore Pain Intervention(s): Monitored during session;Premedicated before session;Patient requesting pain meds-RN notified    Home Living Family/patient expects to be discharged to:: Private residence Living Arrangements: Spouse/significant other Available Help at Discharge: Family;Available 24 hours/day Type of Home: House Home Access: Stairs to enter   [employer] of Steps: 5 in back, 2 in front Home Layout: Two level;Able to live on main level with bedroom/bathroom Home Equipment: Bedside commode;Shower seat - built in      Prior Function Level of Independence: Independent               Hand Dominance        Extremity/Trunk Assessment   Upper Extremity Assessment Upper Extremity Assessment: Overall WFL for tasks assessed  Lower Extremity Assessment Lower Extremity Assessment: RLE deficits/detail;Overall WFL for tasks assessed RLE Deficits / Details: expected post op deficits       Communication    Communication: No difficulties  Cognition Arousal/Alertness: Awake/alert Behavior During Therapy: WFL for tasks assessed/performed Overall Cognitive Status: Within Functional Limits for tasks assessed                                        General Comments      Exercises Total Joint Exercises Ankle Circles/Pumps: AROM;20 reps;Both Quad Sets: Right;10 reps;Seated Heel Slides: 5 reps;Seated;Right Long Arc Quad: 10 reps;Right;Seated Goniometric ROM: Seated: 0 degrees - 92 degrees flexion   Assessment/Plan    PT Assessment Patient needs continued PT services  PT Problem List Decreased strength;Decreased range of motion;Decreased activity tolerance;Decreased balance;Decreased mobility;Pain       PT Treatment Interventions DME instruction;Gait training;Stair training;Functional mobility training;Therapeutic activities;Therapeutic exercise;Balance training;Patient/family education;Neuromuscular re-education    PT Goals (Current goals can be found in the Care Plan section)  Acute Rehab PT Goals Patient Stated Goal: return home PT Goal Formulation: With patient Time For Goal Achievement: 06/11/18 Potential to Achieve Goals: Good    Frequency 7X/week   Barriers to discharge        Co-evaluation               AM-PAC PT "6 Clicks" Daily Activity  Outcome Measure Difficulty turning over in bed (including adjusting bedclothes, sheets and blankets)?: None Difficulty moving from lying on back to sitting on the side of the bed? : None Difficulty sitting down on and standing up from a chair with arms (e.g., wheelchair, bedside commode, etc,.)?: A Little Help needed moving to and from a bed to chair (including a wheelchair)?: A Little Help needed walking in hospital room?: A Little Help needed climbing 3-5 steps with a railing? : A Little 6 Click Score: 20    End of Session Equipment Utilized During Treatment: Gait belt Activity Tolerance: Patient tolerated  treatment well Patient left: in chair;with call bell/phone within reach Nurse Communication: Patient requests pain meds PT Visit Diagnosis: Unsteadiness on feet (R26.81);Pain Pain - Right/Left: Right Pain - part of body: Knee    Time: 0852-0915 PT Time Calculation (min) (ACUTE ONLY): 23 min   Charges:   PT Evaluation $PT Eval Low Complexity: 1 Low PT Treatments $Therapeutic Exercise: 8-22 mins   PT G Codes:        Tara Sloan, PT, DPT Acute Rehabilitation Services  Pager: [phone]   Willy Eddy 06/09/2018, 9:38 AM

## 2018-06-09 NOTE — Progress Notes (Signed)
    Subjective: Patient reports pain as mild to moderate.  Tolerating diet.  Urinating.  +Flatus.  No CP, SOB.  Not yet OOB.  Objective:   VITALS:   Vitals:   06/08/18 2037 06/08/18 2045 06/08/18 2340 06/09/18 0356  BP: (!) 169/84 (!) 154/76 (!) 150/77 139/83  Pulse: 75 83 76 77  Resp: 18 16  14   Temp:  99.8 F (37.7 C) 98.6 F (37 C) 98.2 F (36.8 C)  TempSrc:  Oral Oral Oral  SpO2: 99% 97% 98% 100%  Weight:      Height:       CBC Latest Ref Rng & Units 05/28/2018 03/10/2017 06/30/2016  WBC 4.0 - 10.5 K/uL 8.1 7.5 7.2  Hemoglobin 12.0 - 15.0 g/dL 12.9 13.2 14.1  Hematocrit 36.0 - 46.0 % 39.8 39.3 42.0  Platelets 150 - 400 K/uL 251 189 214.0   BMP Latest Ref Rng & Units 05/28/2018 01/25/2018 07/27/2017  Glucose 65 - 99 mg/dL 89 110(H) 94  BUN 6 - 20 mg/dL 22(H) 20 21  Creatinine 0.44 - 1.00 mg/dL 0.87 0.93 0.96(H)  Sodium 135 - 145 mmol/L 143 141 141  Potassium 3.5 - 5.1 mmol/L 3.8 3.5 4.4  Chloride 101 - 111 mmol/L 110 104 106  CO2 22 - 32 mmol/L 23 28 23   Calcium 8.9 - 10.3 mg/dL 8.8(L) 9.8 9.1   Intake/Output      06/18 0701 - 06/19 0700   P.O. 200   Total Intake(mL/kg) 200 (3.4)   Urine (mL/kg/hr) 1050   Blood 50   Total Output 1100   Net -900       Urine Occurrence 4 x      Physical Exam: General: NAD.  Upright in bed on arrival.  Calm, conversant.  CPM in place. Resp: No increased wob Cardio: regular rate and rhythm ABD soft Neurologically intact MSK Neurovascularly intact Sensation intact distally Feet warm Dorsiflexion/Plantar flexion intact Incision: dressing C/D/I   Assessment: 1 Day Post-Op  S/P Procedure(s) (LRB): UNICOMPARTMENTAL RIGHT KNEE (Right) by Dr. Ernesta Amble. Percell Miller on 06/08/2018  Principal Problem:   Primary osteoarthritis of right knee Active Problems:   Dyslipidemia   GERD   Osteopenia   History of B-cell lymphoma   Left bundle branch block (LBBB)   Aortic atherosclerosis (HCC)   Primary osteoarthritis of knee   Primary  osteoarthritis, status post right unicompartmental knee arthroplasty Doing well postop day 1. Eating, drinking, and voiding. Pain controlled. Late arrival to the floor yesterday-she has not yet mobilized.  I expect that this will go well, but would plan on a.m. and p.m. therapy sessions today before discharge.  Plan: Advance diet Up with therapy Incentive Spirometry Elevate and Apply ice CPM, bone foam  Weight Bearing: Weight Bearing as Tolerated (WBAT) RLE Dressings: Maintain Mepilex.  Please apply thigh high TED hose to operative leg prior to discharge. VTE prophylaxis: Aspirin, SCDs, ambulation Dispo: Home after p.m. therapy sessions today    Patient's anticipated LOS is less than 2 midnights, meeting these requirements: - Lives within 1 hour of care - Has a competent adult at home to recover with post-op recover - NO history of  - Chronic pain requiring opiods  - Diabetes  - Coronary Artery Disease  - Heart failure  - Heart attack  - Stroke  - DVT/VTE  - Cardiac arrhythmia  - Respiratory Failure/COPD  - Renal failure  - Anemia  - Advanced Liver disease   Prudencio Burly III, PA-C 06/09/2018, 7:00 AM

## 2018-06-09 NOTE — Anesthesia Postprocedure Evaluation (Signed)
Anesthesia Post Note  Patient: ENNIS DELPOZO  Procedure(s) Performed: UNICOMPARTMENTAL RIGHT KNEE (Right Knee)     Patient location during evaluation: PACU Anesthesia Type: Regional Level of consciousness: awake and alert, oriented and patient cooperative Pain management: pain level controlled Vital Signs Assessment: post-procedure vital signs reviewed and stable Respiratory status: spontaneous breathing, nonlabored ventilation, patient connected to nasal cannula oxygen and respiratory function stable Cardiovascular status: blood pressure returned to baseline and stable Postop Assessment: spinal receding and no apparent nausea or vomiting Anesthetic complications: no Comments: Delayed entry, pt eval in PACU    Last Vitals:  Vitals:   06/09/18 0356 06/09/18 1247  BP: 139/83 138/72  Pulse: 77 68  Resp: 14 14  Temp: 36.8 C 36.8 C  SpO2: 100% 97%    Last Pain:  Vitals:   06/09/18 1247  TempSrc: Oral  PainSc:                  Kimmora Risenhoover,E. Janyiah Silveri

## 2018-06-09 NOTE — Care Management Note (Signed)
Case Management Note  Patient Details  Name: Tara Sloan MRN: [id number] Date of Birth: 10-05-1946  Subjective/Objective:  73 yr old female s/p right unicompartmental knee.    Action/Plan: Patient was preoperatively setup with [facility], no changes. She will have support at discharge.   Expected Discharge Date:  06/09/18               Expected Discharge Plan:  [place]  In-House Referral:  NA  Discharge planning Services  CM Consult  Post Acute Care Choice:  Durable Medical Equipment, Home Health Choice offered to:  Patient  DME Arranged:  Walker rolling, CPM DME Agency:  [employer]  HH Arranged:  PT HH Agency:  [facility] [employer]  Status of Service:  Completed, signed off  If discussed at [employer], dates discussed:    Additional Comments:  Ninfa Meeker, RN 06/09/2018, 3:01 PM

## 2018-06-09 NOTE — Plan of Care (Signed)
  Problem: Education: Goal: Knowledge of General Education information will improve Outcome: Progressing   

## 2018-06-09 NOTE — Plan of Care (Signed)
  Problem: Education: Goal: Knowledge of General Education information will improve 06/09/2018 1747 by Rance Muir, RN Outcome: Adequate for Discharge 06/09/2018 1032 by Rance Muir, RN Outcome: Progressing

## 2018-06-09 NOTE — Evaluation (Signed)
Occupational Therapy Evaluation and Discharge  Patient Details Name: Tara Sloan MRN: [id number] DOB: 1946-07-31 Today's Date: 06/09/2018    History of Present Illness Pt is a 72 y.o. female s/p R Unicompartmental Knee. PMHx: Arthritis, Basal cell cercinoma, Chronic back pain, Diffuse large B cell lymphoma, GERD, HLD, L BBB, Non Hodgkin's lymphoma, Osteopenia.    Clinical Impression   Pt reports she was active and independent with ADL PTA. Currently pt requires supervision for ADL and functional mobility. All ADL and safety education completed with pt; pt verbalized understanding. Pt planning to d/c home with 24/7 supervision from family. No further acute OT needs identified; signing off at this time. Please re-consult if needs change. Thank you for this referral.    Follow Up Recommendations  No OT follow up;Supervision - Intermittent    Equipment Recommendations  None recommended by OT    Recommendations for Other Services       Precautions / Restrictions Precautions Precautions: Knee Precaution Booklet Issued: No Precaution Comments: Educated pt on no pillow under knee and use of zero degree knee Restrictions Weight Bearing Restrictions: Yes RLE Weight Bearing: Weight bearing as tolerated      Mobility Bed Mobility Overal bed mobility: Needs Assistance Bed Mobility: Supine to Sit     Supine to sit: Supervision;HOB elevated     General bed mobility comments: Supervision for safety, pt able to manage RLE to EOB with increased time  Transfers Overall transfer level: Needs assistance Equipment used: Rolling walker (2 wheeled) Transfers: Sit to/from Stand Sit to Stand: Supervision         General transfer comment: Cues for hand placement. Supervision for safety    Balance Overall balance assessment: Needs assistance Sitting-balance support: Feet supported;No upper extremity supported Sitting balance-Leahy Scale: Good     Standing balance support:  Bilateral upper extremity supported Standing balance-Leahy Scale: Poor Standing balance comment: RW for support                           ADL either performed or assessed with clinical judgement   ADL Overall ADL's : Needs assistance/impaired Eating/Feeding: Set up;Sitting   Grooming: Supervision/safety;Standing   Upper Body Bathing: Set up;Sitting   Lower Body Bathing: Supervison/ safety;Sit to/from stand   Upper Body Dressing : Set up;Sitting   Lower Body Dressing: Supervision/safety;Sit to/from stand Lower Body Dressing Details (indicate cue type and reason): Educated on compensatory strategies for LB ADL; pt able to reach R foot in sitting without difficulty Toilet Transfer: Supervision/safety;Ambulation;RW Toilet Transfer Details (indicate cue type and reason): Simulated by sit to stand from EOB with functional mobiltiy in room. Educated on use of BSC next to bed at night or over toilet     Tub/ Shower Transfer: Supervision/safety;Walk-in shower;Ambulation;Rolling walker;Shower Scientist, research (medical) Details (indicate cue type and reason): Educated pt on safe shower transfer technique Functional mobility during ADLs: Supervision/safety;Rolling walker       Vision         Perception     Praxis      Pertinent Vitals/Pain Pain Assessment: 0-10 Pain Score: 5  Pain Location: R knee Pain Descriptors / Indicators: Discomfort;Sore Pain Intervention(s): Monitored during session;Repositioned;Ice applied     Hand Dominance     Extremity/Trunk Assessment Upper Extremity Assessment Upper Extremity Assessment: Overall WFL for tasks assessed   Lower Extremity Assessment Lower Extremity Assessment: Defer to PT evaluation       Communication Communication Communication: No difficulties  Cognition Arousal/Alertness: Awake/alert Behavior During Therapy: WFL for tasks assessed/performed Overall Cognitive Status: Within Functional Limits for tasks  assessed                                     General Comments       Exercises     Shoulder Instructions      Home Living Family/patient expects to be discharged to:: Private residence Living Arrangements: Spouse/significant other Available Help at Discharge: Family;Available 24 hours/day Type of Home: House Home Access: Stairs to enter [employer] of Steps: 5 in back, 2 in front   Home Layout: Two level;Able to live on main level with bedroom/bathroom     Bathroom Shower/Tub: Occupational psychologist: Handicapped height     Home Equipment: Bedside commode;Shower seat - built in          Prior Functioning/Environment Level of Independence: Independent                 OT Problem List:        OT Treatment/Interventions:      OT Goals(Current goals can be found in the care plan section) Acute Rehab OT Goals Patient Stated Goal: return home OT Goal Formulation: All assessment and education complete, DC therapy  OT Frequency:     Barriers to D/C:            Co-evaluation              AM-PAC PT "6 Clicks" Daily Activity     Outcome Measure Help from another person eating meals?: None Help from another person taking care of personal grooming?: A Little Help from another person toileting, which includes using toliet, bedpan, or urinal?: A Little Help from another person bathing (including washing, rinsing, drying)?: A Little Help from another person to put on and taking off regular upper body clothing?: None Help from another person to put on and taking off regular lower body clothing?: A Little 6 Click Score: 20   End of Session Equipment Utilized During Treatment: Rolling walker CPM Right Knee CPM Right Knee: Off Right Knee Flexion (Degrees): 0 Right Knee Extension (Degrees): 39  Activity Tolerance: Patient tolerated treatment well Patient left: in chair;with call bell/phone within reach  OT Visit Diagnosis:  Other abnormalities of gait and mobility (R26.89);Pain Pain - Right/Left: Right Pain - part of body: Knee                Time: 2633-3545 OT Time Calculation (min): 15 min Charges:  OT General Charges $OT Visit: 1 Visit OT Evaluation $OT Eval Low Complexity: 1 Low G-Codes:     Tymeer Vaquera A. Ulice Brilliant, M.S., OTR/L Acute Rehab Department: [phone]  Binnie Kand 06/09/2018, 9:21 AM

## 2018-06-09 NOTE — Discharge Summary (Signed)
Discharge Summary  Patient ID: Tara Sloan MRN: [id number] DOB/AGE: [birth year] 72 y.o.  Admit date: 06/08/2018 Discharge date: 06/09/2018  Admission Diagnoses:  Primary osteoarthritis of right knee  Discharge Diagnoses:  Principal Problem:   Primary osteoarthritis of right knee Active Problems:   Dyslipidemia   GERD   Osteopenia   History of B-cell lymphoma   Left bundle branch block (LBBB)   Aortic atherosclerosis (HCC)   Primary osteoarthritis of knee   Past Medical History:  Diagnosis Date  . Allergic rhinitis   . Arthritis    "hands; probably qwhere" (03/12/2015)  . Basal cell carcinoma ~ 2010   "leg"  . Chronic back pain   . Diffuse large B cell lymphoma ([place]) 11/07/11   dx from tonsilar biopsy  . External hemorrhoid   . GERD (gastroesophageal reflux disease)   . History of radiation therapy 03/22/12 - 04/09/12   right oropharynx/neck  . HLD (hyperlipidemia)   . LBBB (left bundle branch block) dx'd 05/2014  . Non Hodgkin's lymphoma ([place])   . Osteoarthritis of right knee   . Osteopenia     Surgeries: Procedure(s): UNICOMPARTMENTAL RIGHT KNEE on 06/08/2018   Consultants (if any):   Discharged Condition: Improved  Hospital Course: Tara Sloan is an 72 y.o. female who was admitted 06/08/2018 with a diagnosis of Primary osteoarthritis of right knee and went to the operating room on 06/08/2018 and underwent the above named procedures.    She was given perioperative antibiotics:  Anti-infectives (From admission, onward)   Start     Dose/Rate Route Frequency Ordered Stop   06/08/18 2200  ceFAZolin (ANCEF) IVPB 2g/100 mL premix     2 g 200 mL/hr over 30 Minutes Intravenous Every 6 hours 06/08/18 2124 06/09/18 0557   06/08/18 0830  ceFAZolin (ANCEF) IVPB 2g/100 mL premix     2 g 200 mL/hr over 30 Minutes Intravenous On call to O.R. 06/08/18 0818 06/08/18 1323    .  She was given sequential compression devices, early ambulation, and aspirin for DVT  prophylaxis.  She benefited maximally from the hospital stay and there were no complications.    Recent vital signs:  Vitals:   06/08/18 2340 06/09/18 0356  BP: (!) 150/77 139/83  Pulse: 76 77  Resp:  14  Temp: 98.6 F (37 C) 98.2 F (36.8 C)  SpO2: 98% 100%    Recent laboratory studies:  Lab Results  Component Value Date   HGB 12.9 05/28/2018   HGB 13.2 03/10/2017   HGB 14.1 06/30/2016   Lab Results  Component Value Date   WBC 8.1 05/28/2018   PLT 251 05/28/2018   No results found for: INR Lab Results  Component Value Date   NA 143 05/28/2018   K 3.8 05/28/2018   CL 110 05/28/2018   CO2 23 05/28/2018   BUN 22 (H) 05/28/2018   CREATININE 0.87 05/28/2018   GLUCOSE 89 05/28/2018    Discharge Medications:   Allergies as of 06/09/2018      Reactions   Pnu-imune [pneumococcal Polysaccharide Vaccine] Hives      Medication List    STOP taking these medications   GOODY HEADACHE PO   ibuprofen 200 MG tablet Commonly known as:  ADVIL,MOTRIN     TAKE these medications   acetaminophen 500 MG tablet Commonly known as:  TYLENOL Take 2 tablets (1,000 mg total) by mouth every 8 (eight) hours for 14 days. For Pain. What changed:    when to take this  reasons to take this  additional instructions   alendronate 70 MG tablet Commonly known as:  FOSAMAX TAKE 1 TABLET BY MOUTH EVERY 7 DAYS ON AN EMPTY STOMACH WITH A FULL GLASS OF WATER What changed:    how much to take  how to take this  when to take this  additional instructions   aspirin EC 81 MG tablet Take 1 tablet (81 mg total) by mouth 2 (two) times daily. For DVT prophylaxis What changed:    when to take this  additional instructions   atorvastatin 80 MG tablet Commonly known as:  LIPITOR TAKE 1 TABLET (80 MG TOTAL) BY MOUTH DAILY. What changed:  See the new instructions.   busPIRone 15 MG tablet Commonly known as:  BUSPAR Take 0.5 tablets (7.5 mg total) by mouth 2 (two) times daily.    CALCIUM + D PO Take 1 tablet by mouth daily.   celecoxib 100 MG capsule Commonly known as:  CELEBREX Take 1 capsule (100 mg total) by mouth 2 (two) times daily for 14 days.   docusate sodium 100 MG capsule Commonly known as:  COLACE Take 1 capsule (100 mg total) by mouth 2 (two) times daily. To prevent constipation while taking pain medication.   donepezil 5 MG tablet Commonly known as:  ARICEPT Take 1 tablet (5 mg total) by mouth at bedtime.   methocarbamol 500 MG tablet Commonly known as:  ROBAXIN Take 1 tablet (500 mg total) by mouth every 6 (six) hours as needed for muscle spasms.   multivitamin with minerals Tabs tablet Take 1 tablet by mouth every evening.   omeprazole 20 MG capsule Commonly known as:  PRILOSEC TAKE 1 CAPSULE BY MOUTH DAILY AS NEEDED FOR HEARTBURN   ondansetron 4 MG tablet Commonly known as:  ZOFRAN Take 1 tablet (4 mg total) by mouth every 8 (eight) hours as needed for nausea or vomiting.   oxyCODONE 5 MG immediate release tablet Commonly known as:  ROXICODONE Take 1 tablet (5 mg total) by mouth every 4 (four) hours as needed for breakthrough pain.   Vitamin D-3 1000 units Caps Take 1,000 Units by mouth daily.       Diagnostic Studies: Dg Knee Right Port  Result Date: 06/08/2018 CLINICAL DATA:  Unicompartmental arthroplasty EXAM: PORTABLE RIGHT KNEE - 1-2 VIEW COMPARISON:  03/23/2017 FINDINGS: Medial knee arthroplasty is noted. No acute bony abnormality is seen. Air is noted in the surgical bed. IMPRESSION: Status post medial knee arthroplasty Electronically Signed   By: Inez Catalina M.D.   On: 06/08/2018 15:29    Disposition: Discharge disposition: 01-Home or Self Care        Follow-up Information    Renette Butters, MD Follow up.   Specialty:  Orthopedic Surgery Contact information: [address] [place] [address] [phone]            Signed: Prudencio Burly III PA-C 06/09/2018, 7:03  AM

## 2018-06-10 DIAGNOSIS — M1711 Unilateral primary osteoarthritis, right knee: Secondary | ICD-10-CM | POA: Diagnosis not present

## 2018-06-12 DIAGNOSIS — M19041 Primary osteoarthritis, right hand: Secondary | ICD-10-CM | POA: Diagnosis not present

## 2018-06-12 DIAGNOSIS — Z471 Aftercare following joint replacement surgery: Secondary | ICD-10-CM | POA: Diagnosis not present

## 2018-06-12 DIAGNOSIS — M8580 Other specified disorders of bone density and structure, unspecified site: Secondary | ICD-10-CM | POA: Diagnosis not present

## 2018-06-12 DIAGNOSIS — I700 Atherosclerosis of aorta: Secondary | ICD-10-CM | POA: Diagnosis not present

## 2018-06-12 DIAGNOSIS — M19042 Primary osteoarthritis, left hand: Secondary | ICD-10-CM | POA: Diagnosis not present

## 2018-06-12 DIAGNOSIS — I447 Left bundle-branch block, unspecified: Secondary | ICD-10-CM | POA: Diagnosis not present

## 2018-06-23 DIAGNOSIS — M1711 Unilateral primary osteoarthritis, right knee: Secondary | ICD-10-CM | POA: Diagnosis not present

## 2018-06-30 DIAGNOSIS — R262 Difficulty in walking, not elsewhere classified: Secondary | ICD-10-CM | POA: Diagnosis not present

## 2018-06-30 DIAGNOSIS — M25661 Stiffness of right knee, not elsewhere classified: Secondary | ICD-10-CM | POA: Diagnosis not present

## 2018-06-30 DIAGNOSIS — M25561 Pain in right knee: Secondary | ICD-10-CM | POA: Diagnosis not present

## 2018-06-30 DIAGNOSIS — M6281 Muscle weakness (generalized): Secondary | ICD-10-CM | POA: Diagnosis not present

## 2018-07-05 DIAGNOSIS — M25561 Pain in right knee: Secondary | ICD-10-CM | POA: Diagnosis not present

## 2018-07-05 DIAGNOSIS — M6281 Muscle weakness (generalized): Secondary | ICD-10-CM | POA: Diagnosis not present

## 2018-07-05 DIAGNOSIS — M25661 Stiffness of right knee, not elsewhere classified: Secondary | ICD-10-CM | POA: Diagnosis not present

## 2018-07-05 DIAGNOSIS — R262 Difficulty in walking, not elsewhere classified: Secondary | ICD-10-CM | POA: Diagnosis not present

## 2018-07-08 DIAGNOSIS — M25661 Stiffness of right knee, not elsewhere classified: Secondary | ICD-10-CM | POA: Diagnosis not present

## 2018-07-08 DIAGNOSIS — R262 Difficulty in walking, not elsewhere classified: Secondary | ICD-10-CM | POA: Diagnosis not present

## 2018-07-08 DIAGNOSIS — M6281 Muscle weakness (generalized): Secondary | ICD-10-CM | POA: Diagnosis not present

## 2018-07-08 DIAGNOSIS — M1711 Unilateral primary osteoarthritis, right knee: Secondary | ICD-10-CM | POA: Diagnosis not present

## 2018-07-12 DIAGNOSIS — M6281 Muscle weakness (generalized): Secondary | ICD-10-CM | POA: Diagnosis not present

## 2018-07-12 DIAGNOSIS — M1711 Unilateral primary osteoarthritis, right knee: Secondary | ICD-10-CM | POA: Diagnosis not present

## 2018-07-12 DIAGNOSIS — M25661 Stiffness of right knee, not elsewhere classified: Secondary | ICD-10-CM | POA: Diagnosis not present

## 2018-07-12 DIAGNOSIS — R262 Difficulty in walking, not elsewhere classified: Secondary | ICD-10-CM | POA: Diagnosis not present

## 2018-07-15 DIAGNOSIS — M1711 Unilateral primary osteoarthritis, right knee: Secondary | ICD-10-CM | POA: Diagnosis not present

## 2018-07-15 DIAGNOSIS — M25661 Stiffness of right knee, not elsewhere classified: Secondary | ICD-10-CM | POA: Diagnosis not present

## 2018-07-15 DIAGNOSIS — R262 Difficulty in walking, not elsewhere classified: Secondary | ICD-10-CM | POA: Diagnosis not present

## 2018-07-15 DIAGNOSIS — M6281 Muscle weakness (generalized): Secondary | ICD-10-CM | POA: Diagnosis not present

## 2018-07-19 DIAGNOSIS — R262 Difficulty in walking, not elsewhere classified: Secondary | ICD-10-CM | POA: Diagnosis not present

## 2018-07-19 DIAGNOSIS — M6281 Muscle weakness (generalized): Secondary | ICD-10-CM | POA: Diagnosis not present

## 2018-07-19 DIAGNOSIS — M25661 Stiffness of right knee, not elsewhere classified: Secondary | ICD-10-CM | POA: Diagnosis not present

## 2018-07-19 DIAGNOSIS — M1711 Unilateral primary osteoarthritis, right knee: Secondary | ICD-10-CM | POA: Diagnosis not present

## 2018-07-21 ENCOUNTER — Other Ambulatory Visit: Payer: Self-pay | Admitting: *Deleted

## 2018-07-21 MED ORDER — OMEPRAZOLE 20 MG PO CPDR: DELAYED_RELEASE_CAPSULE | ORAL | 1 refills | Status: DC

## 2018-07-22 DIAGNOSIS — R262 Difficulty in walking, not elsewhere classified: Secondary | ICD-10-CM | POA: Diagnosis not present

## 2018-07-22 DIAGNOSIS — M6281 Muscle weakness (generalized): Secondary | ICD-10-CM | POA: Diagnosis not present

## 2018-07-22 DIAGNOSIS — M25661 Stiffness of right knee, not elsewhere classified: Secondary | ICD-10-CM | POA: Diagnosis not present

## 2018-07-22 DIAGNOSIS — M1711 Unilateral primary osteoarthritis, right knee: Secondary | ICD-10-CM | POA: Diagnosis not present

## 2018-08-17 ENCOUNTER — Telehealth: Payer: Self-pay | Admitting: Family Medicine

## 2018-08-17 DIAGNOSIS — Z0000 Encounter for general adult medical examination without abnormal findings: Secondary | ICD-10-CM

## 2018-08-17 DIAGNOSIS — Z8639 Personal history of other endocrine, nutritional and metabolic disease: Secondary | ICD-10-CM

## 2018-08-17 DIAGNOSIS — E785 Hyperlipidemia, unspecified: Secondary | ICD-10-CM

## 2018-08-17 DIAGNOSIS — R739 Hyperglycemia, unspecified: Secondary | ICD-10-CM

## 2018-08-17 NOTE — Telephone Encounter (Signed)
-----   Message from Eustace Pen, LPN sent at 3/88/8280 12:30 PM EDT ----- Regarding: Labs 8/28 Lab orders needed. Thank you.  Insurance:  [employer]

## 2018-08-18 ENCOUNTER — Ambulatory Visit (INDEPENDENT_AMBULATORY_CARE_PROVIDER_SITE_OTHER): Payer: Medicare Other

## 2018-08-18 VITALS — BP 116/70 | HR 79 | Temp 98.0°F | Ht 63.0 in | Wt 131.2 lb

## 2018-08-18 DIAGNOSIS — R739 Hyperglycemia, unspecified: Secondary | ICD-10-CM

## 2018-08-18 DIAGNOSIS — Z0000 Encounter for general adult medical examination without abnormal findings: Secondary | ICD-10-CM | POA: Diagnosis not present

## 2018-08-18 DIAGNOSIS — Z8639 Personal history of other endocrine, nutritional and metabolic disease: Secondary | ICD-10-CM

## 2018-08-18 DIAGNOSIS — E785 Hyperlipidemia, unspecified: Secondary | ICD-10-CM | POA: Diagnosis not present

## 2018-08-18 LAB — CBC WITH DIFFERENTIAL/PLATELET
Basophils Absolute: 0.1 10*3/uL (ref 0.0–0.1)
Basophils Relative: 0.6 % (ref 0.0–3.0)
Eosinophils Absolute: 0.1 10*3/uL (ref 0.0–0.7)
Eosinophils Relative: 0.6 % (ref 0.0–5.0)
HCT: 40 % (ref 36.0–46.0)
Hemoglobin: 13.2 g/dL (ref 12.0–15.0)
Lymphocytes Relative: 19.4 % (ref 12.0–46.0)
Lymphs Abs: 1.8 10*3/uL (ref 0.7–4.0)
MCHC: 33.1 g/dL (ref 30.0–36.0)
MCV: 89.7 fl (ref 78.0–100.0)
Monocytes Absolute: 0.9 10*3/uL (ref 0.1–1.0)
Monocytes Relative: 10.2 % (ref 3.0–12.0)
Neutro Abs: 6.3 10*3/uL (ref 1.4–7.7)
Neutrophils Relative %: 69.2 % (ref 43.0–77.0)
Platelets: 215 10*3/uL (ref 150.0–400.0)
RBC: 4.46 Mil/uL (ref 3.87–5.11)
RDW: 15.2 % (ref 11.5–15.5)
WBC: 9.1 10*3/uL (ref 4.0–10.5)

## 2018-08-18 LAB — COMPREHENSIVE METABOLIC PANEL
ALT: 15 U/L (ref 0–35)
AST: 21 U/L (ref 0–37)
Albumin: 3.9 g/dL (ref 3.5–5.2)
Alkaline Phosphatase: 61 U/L (ref 39–117)
BUN: 23 mg/dL (ref 6–23)
CO2: 24 mEq/L (ref 19–32)
Calcium: 9.1 mg/dL (ref 8.4–10.5)
Chloride: 107 mEq/L (ref 96–112)
Creatinine, Ser: 0.89 mg/dL (ref 0.40–1.20)
GFR: 66.12 mL/min (ref >60.00)
Glucose, Bld: 104 mg/dL — ABNORMAL HIGH (ref 70–99)
Potassium: 4.3 mEq/L (ref 3.5–5.1)
Sodium: 140 mEq/L (ref 135–145)
Total Bilirubin: 0.6 mg/dL (ref 0.2–1.2)
Total Protein: 6.6 g/dL (ref 6.0–8.3)

## 2018-08-18 LAB — LIPID PANEL
Cholesterol: 131 mg/dL (ref 0–200)
HDL: 37.2 mg/dL — ABNORMAL LOW (ref >39.00)
LDL Cholesterol: 54 mg/dL (ref 0–99)
NonHDL: 93.51
Total CHOL/HDL Ratio: 4
Triglycerides: 198 mg/dL — ABNORMAL HIGH (ref 0.0–149.0)
VLDL: 39.6 mg/dL (ref 0.0–40.0)

## 2018-08-18 LAB — HEMOGLOBIN A1C: Hgb A1c MFr Bld: 5.9 % (ref 4.6–6.5)

## 2018-08-18 LAB — TSH: TSH: 2 u[IU]/mL (ref 0.35–4.50)

## 2018-08-18 LAB — VITAMIN D 25 HYDROXY (VIT D DEFICIENCY, FRACTURES): VITD: 49.28 ng/mL (ref 30.00–100.00)

## 2018-08-18 NOTE — Progress Notes (Signed)
Subjective:   Tara Sloan is a 72 y.o. female who presents for Medicare Annual (Subsequent) preventive examination.  Review of Systems:  N/A Cardiac Risk Factors include: advanced age (>2men, >29 women);dyslipidemia     Objective:     Vitals: BP 116/70 (BP Location: Right Arm, Patient Position: Sitting, Cuff Size: Normal)   Pulse 79   Temp 98 F (36.7 C) (Oral)   Ht 5\' 3"  (1.6 m) Comment: no shoes  Wt 131 lb 4 oz (59.5 kg)   SpO2 98%   BMI 23.25 kg/m   Body mass index is 23.25 kg/m.  Advanced Directives 08/18/2018 05/28/2018 07/27/2017 07/15/2016 03/10/2016 08/29/2015 03/12/2015  Does Patient Have a Medical Advance Directive? Yes Yes Yes Yes Yes Yes Yes  Type of Paramedic of [place];Living will Living will [place];Living will [place];Living will Healthcare Power of [place];Living will Living will  Does patient want to make changes to medical advance directive? - No - Patient declined - No - Patient declined No - Patient declined No - Patient declined No - Patient declined  Copy of [place] in Chart? No - copy requested - No - copy requested - No - copy requested No - copy requested No - copy requested  Would patient like information on creating a medical advance directive? - - - - - - -    Tobacco Social History   Tobacco Use  Smoking Status Former Smoker  . Packs/day: 0.25  . Years: 10.00  . Pack years: 2.50  . Types: Cigarettes  . Last attempt to quit: 11/04/1974  . Years since quitting: 43.8  Smokeless Tobacco Never Used     Counseling given: No   Clinical Intake:  Pre-visit preparation completed: Yes  Pain : 0-10 Pain Score: 4  Pain Type: Acute pain Pain Location: Back Pain Orientation: Lower Pain Onset: More than a month ago Pain Frequency: Constant     Nutritional Status: BMI 25 -29 Overweight Nutritional Risks: None Diabetes:  No  How often do you need to have someone help you when you read instructions, pamphlets, or other written materials from your doctor or pharmacy?: 1 - Never What is the last grade level you completed in school?: Associates degree  Interpreter Needed?: No  Comments: pt lives with spouse Information entered by :: LPinson, LPN  Past Medical History:  Diagnosis Date  . Allergic rhinitis   . Arthritis    "hands; probably qwhere" (03/12/2015)  . Basal cell carcinoma ~ 2010   "leg"  . Chronic back pain   . 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 External hemorrhoid   . GERD (gastroesophageal reflux disease)   . History of radiation therapy 03/22/12 - 04/09/12   right oropharynx/neck  . HLD (hyperlipidemia)   . LBBB (left bundle branch block) dx'd 05/2014  . Non Hodgkin's lymphoma ([place])   . Osteoarthritis of right knee   . Osteopenia    Past Surgical History:  Procedure Laterality Date  . ABDOMINAL HYSTERECTOMY  1988   "partial; for fibroids"  . BASAL CELL CARCINOMA EXCISION Right ~ 2010   "leg"  . BREAST BIOPSY Left 12/03   Negative  . COLONOSCOPY  5/06   Ext. hemms  . DILATION AND CURETTAGE OF UTERUS  1987  . PARTIAL KNEE ARTHROPLASTY Right 06/08/2018   Procedure: UNICOMPARTMENTAL RIGHT KNEE;  Surgeon: Renette Butters, MD;  Location:  [facility];  Service: Orthopedics;  Laterality: Right;  . REPLACEMENT UNICONDYLAR JOINT KNEE Right 06/08/2018  . TONSILLECTOMY  11/07/2011   Procedure: TONSILLECTOMY;  Surgeon: Rozetta Nunnery, MD;  Location: [facility];  Service: ENT;  Laterality: N/A;  . TONSILLECTOMY  10/2011   "right was cancerous"  . TUBAL LIGATION  1987   Family History  Problem Relation Age of Onset  . Heart attack Father 70  . Heart disease Father   . Stroke Mother 6  . Breast cancer Paternal Aunt   . Cancer Paternal Aunt   . Cancer Paternal Aunt   . Hyperlipidemia Unknown        Family history  . Colon cancer  Neg Hx   . Colon polyps Neg Hx    Social History   Socioeconomic History  . Marital status: Married    Spouse name: Not on file  . Number of children: 2  . Years of education: Not on file  . Highest education level: Not on file  Occupational History  . Occupation: Employed    Comment: [employer], Glass blower/designer  Social Needs  . Financial resource strain: Not on file  . Food insecurity:    Worry: Not on file    Inability: Not on file  . Transportation needs:    Medical: Not on file    Non-medical: Not on file  Tobacco Use  . Smoking status: Former Smoker    Packs/day: 0.25    Years: 10.00    Pack years: 2.50    Types: Cigarettes    Last attempt to quit: 11/04/1974    Years since quitting: 43.8  . Smokeless tobacco: Never Used  Substance and Sexual Activity  . Alcohol use: Yes    Alcohol/week: 0.0 standard drinks    Comment: rare  . Drug use: No  . Sexual activity: Never    Birth control/protection: None  Lifestyle  . Physical activity:    Days per week: Not on file    Minutes per session: Not on file  . Stress: Not on file  Relationships  . Social connections:    Talks on phone: Not on file    Gets together: Not on file    Attends religious service: Not on file    Active member of club or organization: Not on file    Attends meetings of clubs or organizations: Not on file    Relationship status: Not on file  Other Topics Concern  . Not on file  Social History Narrative   Married (husband with a lot of health problems and CAD)      2 children      Exercise-going to [employer]      Employed-plans to retire at 58    Outpatient Encounter Medications as of 08/18/2018  Medication Sig  . alendronate (FOSAMAX) 70 MG tablet TAKE 1 TABLET BY MOUTH EVERY 7 DAYS ON AN EMPTY STOMACH WITH A FULL GLASS OF WATER (Patient taking differently: Take 70 mg by mouth every Wednesday. TAKE 1 TABLET BY MOUTH EVERY 7 DAYS ON AN EMPTY STOMACH WITH A FULL GLASS OF WATER)  .  aspirin EC 81 MG tablet Take 1 tablet (81 mg total) by mouth 2 (two) times daily. For DVT prophylaxis  . atorvastatin (LIPITOR) 80 MG tablet TAKE 1 TABLET (80 MG TOTAL) BY MOUTH DAILY. (Patient taking differently: TAKE 1 TABLET (80 MG TOTAL) BY MOUTH DAILY IN THE EVENING.)  . busPIRone (BUSPAR) 15 MG tablet Take 0.5  tablets (7.5 mg total) by mouth 2 (two) times daily.  . Calcium Citrate-Vitamin D (CALCIUM + D PO) Take 1 tablet by mouth daily.  . Cholecalciferol (VITAMIN D-3) 1000 UNITS CAPS Take 1,000 Units by mouth daily.   Marland Kitchen docusate sodium (COLACE) 100 MG capsule Take 1 capsule (100 mg total) by mouth 2 (two) times daily. To prevent constipation while taking pain medication. (Patient taking differently: Take 100 mg by mouth daily as needed. To prevent constipation while taking pain medication.)  . donepezil (ARICEPT) 5 MG tablet Take 1 tablet (5 mg total) by mouth at bedtime.  . methocarbamol (ROBAXIN) 500 MG tablet Take 1 tablet (500 mg total) by mouth every 6 (six) hours as needed for muscle spasms.  . Multiple Vitamin (MULTIVITAMIN WITH MINERALS) TABS tablet Take 1 tablet by mouth every evening.  Marland Kitchen omeprazole (PRILOSEC) 20 MG capsule TAKE 1 CAPSULE BY MOUTH DAILY AS NEEDED FOR HEARTBURN  . [DISCONTINUED] ondansetron (ZOFRAN) 4 MG tablet Take 1 tablet (4 mg total) by mouth every 8 (eight) hours as needed for nausea or vomiting.  . [DISCONTINUED] prochlorperazine (COMPAZINE) 10 MG tablet Take 1 tablet (10 mg total) by mouth every 6 (six) hours as needed (Nausea or vomiting).   No facility-administered encounter medications on file as of 08/18/2018.     Activities of Daily Living In your present state of health, do you have any difficulty performing the following activities: 08/18/2018 06/08/2018  Hearing? N -  Vision? N -  Difficulty concentrating or making decisions? N -  Walking or climbing stairs? N -  Dressing or bathing? N -  Doing errands, shopping? N N  Preparing Food and eating ? N -   Using the Toilet? N -  In the past six months, have you accidently leaked urine? N -  Do you have problems with loss of bowel control? N -  Managing your Medications? N -  Managing your Finances? N -  Housekeeping or managing your Housekeeping? N -  Some recent data might be hidden    Patient Care Team: Tower, Wynelle Fanny, MD as PCP - General Rockey Situ, Kathlene November, MD as Consulting Physician (Cardiology) Lyla Glassing, MD as Referring Physician (Ophthalmology)    Assessment:   This is a routine wellness examination for Merryl.   Hearing Screening   125Hz  250Hz  500Hz  1000Hz  2000Hz  3000Hz  4000Hz  6000Hz  8000Hz   Right ear:   40 40 40  40    Left ear:   40 40 40  40    Vision Screening Comments: Vision exam in Fall 2018 @ [facility]    Exercise Activities and Dietary recommendations Current Exercise Habits: The patient does not participate in regular exercise at present, Exercise limited by: orthopedic condition(s)  Goals    . Increase physical activity     When medically released, I will resume exercise for at least 60 minutes 3 days per week.        Fall Risk Fall Risk  08/18/2018 07/27/2017 07/15/2016 06/29/2015 06/27/2014  Falls in the past year? No No No No No   Depression Screen PHQ 2/9 Scores 08/18/2018 07/27/2017 07/15/2016 06/18/2016  PHQ - 2 Score 0 0 0 6  PHQ- 9 Score 0 - - 18     Cognitive Function MMSE - Mini Mental State Exam 08/18/2018 07/27/2017 07/15/2016  Orientation to time 5 5 5   Orientation to Place 5 5 5   Registration 3 3 3   Attention/ Calculation 0 0 0  Recall 1 3 3  Recall-comments unable to recall 2 of 3 words - -  Language- name 2 objects 0 0 0  Language- repeat 1 1 1   Language- follow 3 step command 3 3 3   Language- read & follow direction 0 0 0  Write a sentence 0 0 0  Copy design 0 0 0  Total score 18 20 20      PLEASE NOTE: A Mini-Cog screen was completed. Maximum score is 20. A value of 0 denotes this part of [place] MMSE was not completed  or the patient failed this part of the Mini-Cog screening.   Mini-Cog Screening Orientation to Time - Max 5 pts Orientation to Place - Max 5 pts Registration - Max 3 pts Recall - Max 3 pts Language Repeat - Max 1 pts Language Follow 3 Step Command - Max 3 pts     Immunization History  Administered Date(s) Administered  . Hepatitis B 04/08/2005, 05/06/2005, 10/08/2005  . Influenza Split 12/03/2011, 09/15/2012  . Influenza Whole 10/31/2002, 10/10/2008, 10/12/2009  . Influenza,inj,Quad PF,6+ Mos 10/10/2014, 10/30/2015, 10/15/2017  . Influenza-Unspecified 09/18/2013  . Pneumococcal Conjugate-13 06/27/2014  . Pneumococcal Polysaccharide-23 12/12/2010  . Td 11/14/2002, 03/16/2013  . Zoster 10/14/2012   Screening Tests Health Maintenance  Topic Date Due  . INFLUENZA VACCINE  03/23/2019 (Originally 07/22/2018)  . MAMMOGRAM  09/18/2018  . TETANUS/TDAP  03/17/2023  . COLONOSCOPY  09/11/2025  . DEXA SCAN  Completed  . Hepatitis C Screening  Completed  . PNA vac Low Risk Adult  Completed      Plan:   I have personally reviewed, addressed, and noted the following in the patient's chart:  A. Medical and social history B. Use of alcohol, tobacco or illicit drugs  C. Current medications and supplements D. Functional ability and status E.  Nutritional status F.  Physical activity G. Advance directives H. List of other physicians I.  Hospitalizations, surgeries, and ER visits in previous 12 months J.  [place] to include hearing, vision, cognitive, depression L. Referrals and appointments - none  In addition, I have reviewed and discussed with patient certain preventive protocols, quality metrics, and best practice recommendations. A written personalized care plan for preventive services as well as general preventive health recommendations were provided to patient.  See attached scanned questionnaire for additional information.   Signed,   Lindell Noe, MHA, BS,  LPN Health Coach

## 2018-08-18 NOTE — Patient Instructions (Signed)
Ms. Situ , Thank you for taking time to come for your Medicare Wellness Visit. I appreciate your ongoing commitment to your health goals. Please review the following plan we discussed and let me know if I can assist you in the future.   These are the goals we discussed: Goals    . Increase physical activity     When medically released, I will resume exercise for at least 60 minutes 3 days per week.        This is a list of the screening recommended for you and due dates:  Health Maintenance  Topic Date Due  . Flu Shot  03/23/2019*  . Mammogram  09/18/2018  . Tetanus Vaccine  03/17/2023  . Colon Cancer Screening  09/11/2025  . DEXA scan (bone density measurement)  Completed  .  Hepatitis C: One time screening is recommended by Center for Disease Control  (CDC) for  adults born from [birth year] through 1966.   Completed  . Pneumonia vaccines  Completed  *Topic was postponed. The date shown is not the original due date.   Preventive Care for Adults  A healthy lifestyle and preventive care can promote health and wellness. Preventive health guidelines for adults include the following key practices.  . A routine yearly physical is a good way to check with your health care provider about your health and preventive screening. It is a chance to share any concerns and updates on your health and to receive a thorough exam.  . Visit your dentist for a routine exam and preventive care every 6 months. Brush your teeth twice a day and floss once a day. Good oral hygiene prevents tooth decay and gum disease.  . The frequency of eye exams is based on your age, health, family medical history, use  of contact lenses, and other factors. Follow your health care provider's recommendations for frequency of eye exams.  . Eat a healthy diet. Foods like vegetables, fruits, whole grains, low-fat dairy products, and lean protein foods contain the nutrients you need without too many calories. Decrease your intake  of foods high in solid fats, added sugars, and salt. Eat the right amount of calories for you. Get information about a proper diet from your health care provider, if necessary.  . Regular physical exercise is one of the most important things you can do for your health. Most adults should get at least 150 minutes of moderate-intensity exercise (any activity that increases your heart rate and causes you to sweat) each week. In addition, most adults need muscle-strengthening exercises on 2 or more days a week.  Silver Sneakers may be a benefit available to you. To determine eligibility, you may visit the website: www.silversneakers.com or contact program at 7122985016 Mon-Fri between 8AM-8PM.   . Maintain a healthy weight. The body mass index (BMI) is a screening tool to identify possible weight problems. It provides an estimate of body fat based on height and weight. Your health care provider can find your BMI and can help you achieve or maintain a healthy weight.   For adults 20 years and older: ? A BMI below 18.5 is considered underweight. ? A BMI of 18.5 to 24.9 is normal. ? A BMI of 25 to 29.9 is considered overweight. ? A BMI of 30 and above is considered obese.   . Maintain normal blood lipids and cholesterol levels by exercising and minimizing your intake of saturated fat. Eat a balanced diet with plenty of fruit and vegetables. Blood tests  for lipids and cholesterol should begin at age 31 and be repeated every 5 years. If your lipid or cholesterol levels are high, you are over 50, or you are at high risk for heart disease, you may need your cholesterol levels checked more frequently. Ongoing high lipid and cholesterol levels should be treated with medicines if diet and exercise are not working.  . If you smoke, find out from your health care provider how to quit. If you do not use tobacco, please do not start.  . If you choose to drink alcohol, please do not consume more than 2 drinks  per day. One drink is considered to be 12 ounces (355 mL) of beer, 5 ounces (148 mL) of wine, or 1.5 ounces (44 mL) of liquor.  . If you are 20-41 years old, ask your health care provider if you should take aspirin to prevent strokes.  . Use sunscreen. Apply sunscreen liberally and repeatedly throughout the day. You should seek shade when your shadow is shorter than you. Protect yourself by wearing long sleeves, pants, a wide-brimmed hat, and sunglasses year round, whenever you are outdoors.  . Once a month, do a whole body skin exam, using a mirror to look at the skin on your back. Tell your health care provider of new moles, moles that have irregular borders, moles that are larger than a pencil eraser, or moles that have changed in shape or color.

## 2018-08-18 NOTE — Progress Notes (Signed)
PCP notes:   Health maintenance:  Flu vaccine - addressed  Abnormal screenings:   Mini-Cog score: 18/20 MMSE - Mini Mental State Exam 08/18/2018 07/27/2017 07/15/2016  Orientation to time 5 5 5   Orientation to Place 5 5 5   Registration 3 3 3   Attention/ Calculation 0 0 0  Recall 1 3 3   Recall-comments unable to recall 2 of 3 words - -  Language- name 2 objects 0 0 0  Language- repeat 1 1 1   Language- follow 3 step command 3 3 3   Language- read & follow direction 0 0 0  Write a sentence 0 0 0  Copy design 0 0 0  Total score 18 20 20     Patient concerns:   Patient is recovering from right knee replacement.  Nurse concerns:  None  Next PCP appt:   08/19/18 @ 0900  I reviewed health advisor's note, was available for consultation, and agree with documentation and plan. Loura Pardon MD

## 2018-08-19 ENCOUNTER — Other Ambulatory Visit: Payer: Self-pay | Admitting: Family Medicine

## 2018-08-19 ENCOUNTER — Ambulatory Visit (INDEPENDENT_AMBULATORY_CARE_PROVIDER_SITE_OTHER): Payer: Medicare Other | Admitting: Family Medicine

## 2018-08-19 ENCOUNTER — Encounter: Payer: Self-pay | Admitting: Family Medicine

## 2018-08-19 VITALS — BP 114/76 | HR 87 | Temp 98.0°F | Ht 63.0 in | Wt 131.2 lb

## 2018-08-19 DIAGNOSIS — R7303 Prediabetes: Secondary | ICD-10-CM | POA: Diagnosis not present

## 2018-08-19 DIAGNOSIS — G3184 Mild cognitive impairment, so stated: Secondary | ICD-10-CM | POA: Diagnosis not present

## 2018-08-19 DIAGNOSIS — Z8639 Personal history of other endocrine, nutritional and metabolic disease: Secondary | ICD-10-CM

## 2018-08-19 DIAGNOSIS — M8580 Other specified disorders of bone density and structure, unspecified site: Secondary | ICD-10-CM

## 2018-08-19 DIAGNOSIS — E2839 Other primary ovarian failure: Secondary | ICD-10-CM

## 2018-08-19 DIAGNOSIS — Z0000 Encounter for general adult medical examination without abnormal findings: Secondary | ICD-10-CM

## 2018-08-19 DIAGNOSIS — E785 Hyperlipidemia, unspecified: Secondary | ICD-10-CM

## 2018-08-19 DIAGNOSIS — Z1231 Encounter for screening mammogram for malignant neoplasm of breast: Secondary | ICD-10-CM

## 2018-08-19 MED ORDER — BUSPIRONE HCL 15 MG PO TABS: 7.5000 mg | ORAL_TABLET | Freq: Two times a day (BID) | ORAL | 3 refills | Status: DC

## 2018-08-19 MED ORDER — ATORVASTATIN CALCIUM 80 MG PO TABS: 80.0000 mg | ORAL_TABLET | Freq: Every day | ORAL | 3 refills | Status: DC

## 2018-08-19 MED ORDER — OMEPRAZOLE 20 MG PO CPDR: DELAYED_RELEASE_CAPSULE | ORAL | 1 refills | Status: DC

## 2018-08-19 MED ORDER — DONEPEZIL HCL 5 MG PO TABS: 5.0000 mg | ORAL_TABLET | Freq: Every day | ORAL | 3 refills | Status: DC

## 2018-08-19 MED ORDER — ALENDRONATE SODIUM 70 MG PO TABS: 70.0000 mg | ORAL_TABLET | ORAL | 3 refills | Status: DC

## 2018-08-19 NOTE — Assessment & Plan Note (Signed)
D level 49 Vitamin D level is therapeutic with current supplementation Disc importance of this to bone and overall health

## 2018-08-19 NOTE — Assessment & Plan Note (Signed)
Reviewed health habits including diet and exercise and skin cancer prevention Reviewed appropriate screening tests for age  Also reviewed health mt list, fam hx and immunization status , as well as social and family history   See HPI Labs reviewed  amw reviewed  dexa ordered  Enc flu shot in the fall  Enc exercise  Did well with knee replacement

## 2018-08-19 NOTE — Patient Instructions (Addendum)
Get a flu shot in the fall  Also schedule your mammogram   We will refer you for bone density test   Exercise will increase good cholesterol number- keep up the good work   To prevent diabetes  Try to get most of your carbohydrates from produce (with the exception of white potatoes)  Eat less bread/pasta/rice/snack foods/cereals/sweets and other items from the middle of the grocery store (processed carbs)

## 2018-08-19 NOTE — Assessment & Plan Note (Signed)
On year 5 of fosamax (can stop in a year)  No problems  Due for dexa- ref done No falls or fx  On ca and D Exercising

## 2018-08-19 NOTE — Assessment & Plan Note (Signed)
Lab Results  Component Value Date   HGBA1C 5.9 08/18/2018   disc imp of low glycemic diet and wt loss to prevent DM2

## 2018-08-19 NOTE — Assessment & Plan Note (Signed)
No clinical changes  Continue aricept  Rev mini cog

## 2018-08-19 NOTE — Assessment & Plan Note (Signed)
Disc goals for lipids and reasons to control them Rev last labs with pt Rev low sat fat diet in detail  HDL - coming up with exercise  Controlled with atorvastatin and diet

## 2018-08-19 NOTE — Progress Notes (Signed)
Subjective:    Patient ID: Tara Sloan, female    DOB: 03/08/1946, 72 y.o.   MRN: [id number]  HPI Here for health maintenance exam and to review chronic medical problems    Had a knee replacement 7 wk ago - is improved /went well  PT is done-going to the [facility] Readings from Last 3 Encounters:  08/19/18 131 lb 4 oz (59.5 kg)  08/18/18 131 lb 4 oz (59.5 kg)  06/08/18 129 lb (58.5 kg)  stable  Trying to take care of herself  Exercise/ good diet  23.25 kg/m   Had amw on 8/28 Mini cog 18 (unable to recall 2 of 3 words)-= taking aricept for known mci   Flu shot   Mammogram 9/18 -will be due next mo/she will schedule  Self breast exam -no lumps or changes   Colonoscopy 9/16 with 10 y recall   dexa 8/17 - osteopenia with improved hip score On fosamax and ca/D D level is 49 No falls or fractures   zostavax 10/13  BP BP Readings from Last 3 Encounters:  08/19/18 114/76  08/18/18 116/70  06/09/18 138/72   Hyperlipidemia Lab Results  Component Value Date   CHOL 131 08/18/2018   CHOL 127 07/27/2017   CHOL 186 06/30/2016   Lab Results  Component Value Date   HDL 37.20 (L) 08/18/2018   HDL 32 (L) 07/27/2017   HDL 36.60 (L) 06/30/2016   Lab Results  Component Value Date   LDLCALC 54 08/18/2018   LDLCALC 67 07/27/2017   LDLCALC 114 (H) 06/30/2016   Lab Results  Component Value Date   TRIG 198.0 (H) 08/18/2018   TRIG 142 07/27/2017   TRIG 176.0 (H) 06/30/2016   Lab Results  Component Value Date   CHOLHDL 4 08/18/2018   CHOLHDL 4.0 07/27/2017   CHOLHDL 5 06/30/2016   Lab Results  Component Value Date   LDLDIRECT 92.2 03/02/2012   atorvastatin and diet   Blood glucose control Lab Results  Component Value Date   HGBA1C 5.9 08/18/2018  up from 5.6  Fairly stable   Taking aricept for MCI  About the same-has not noticed any changes   Lab Results  Component Value Date   WBC 9.1 08/18/2018   HGB 13.2 08/18/2018   HCT 40.0 08/18/2018   MCV  89.7 08/18/2018   PLT 215.0 08/18/2018    Lab Results  Component Value Date   CREATININE 0.89 08/18/2018   BUN 23 08/18/2018   NA 140 08/18/2018   K 4.3 08/18/2018   CL 107 08/18/2018   CO2 24 08/18/2018    Lab Results  Component Value Date   ALT 15 08/18/2018   AST 21 08/18/2018   ALKPHOS 61 08/18/2018   BILITOT 0.6 08/18/2018    Lab Results  Component Value Date   TSH 2.00 08/18/2018      Patient Active Problem List   Diagnosis Date Noted  . Primary osteoarthritis of knee 06/08/2018  . Primary osteoarthritis of right knee 05/19/2018  . Pre-operative clearance 05/13/2018  . Knee osteoarthritis 05/03/2018  . Mild cognitive impairment 01/25/2018  . History of vitamin D deficiency 07/31/2017  . Stress reaction 08/04/2016  . Estrogen deficiency 07/04/2016  . Aortic atherosclerosis ([place]) 07/06/2015  . Colon cancer screening 06/29/2015  . Routine general medical examination at a health care facility 06/21/2015  . Chest pain 03/11/2015  . Left bundle branch block (LBBB) 07/06/2014  . Encounter for Medicare annual wellness exam 03/17/2013  .  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 Pain in joint, ankle and foot 03/16/2013  . Hyperglycemia 12/01/2011  . History of B-cell lymphoma 11/22/2011  . ARTHRITIS, HANDS, BILATERAL 12/12/2010  . Dyslipidemia 04/02/2008  . ALLERGIC RHINITIS 04/02/2008  . GERD 04/02/2008  . Osteopenia 09/13/2007  . ALLERGY 08/06/2007   Past Medical History:  Diagnosis Date  . Allergic rhinitis   . Arthritis    "hands; probably qwhere" (03/12/2015)  . Basal cell carcinoma ~ 2010   "leg"  . Chronic back pain   . 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 External hemorrhoid   . GERD (gastroesophageal reflux disease)   . History of radiation therapy 03/22/12 - 04/09/12   right oropharynx/neck  . HLD (hyperlipidemia)   . LBBB (left bundle branch block) dx'd 05/2014  . Non Hodgkin's lymphoma ([place])   .  Osteoarthritis of right knee   . Osteopenia    Past Surgical History:  Procedure Laterality Date  . ABDOMINAL HYSTERECTOMY  1988   "partial; for fibroids"  . BASAL CELL CARCINOMA EXCISION Right ~ 2010   "leg"  . BREAST BIOPSY Left 12/03   Negative  . COLONOSCOPY  5/06   Ext. hemms  . DILATION AND CURETTAGE OF UTERUS  1987  . PARTIAL KNEE ARTHROPLASTY Right 06/08/2018   Procedure: UNICOMPARTMENTAL RIGHT KNEE;  Surgeon: Renette Butters, MD;  Location: [facility];  Service: Orthopedics;  Laterality: Right;  . REPLACEMENT UNICONDYLAR JOINT KNEE Right 06/08/2018  . TONSILLECTOMY  11/07/2011   Procedure: TONSILLECTOMY;  Surgeon: Rozetta Nunnery, MD;  Location[facility];  Service: ENT;  Laterality: N/A;  . TONSILLECTOMY  10/2011   "right was cancerous"  . TUBAL LIGATION  1987   Social History   Tobacco Use  . Smoking status: Former Smoker    Packs/day: 0.25    Years: 10.00    Pack years: 2.50    Types: Cigarettes    Last attempt to quit: 11/04/1974    Years since quitting: 43.8  . Smokeless tobacco: Never Used  Substance Use Topics  . Alcohol use: Yes    Alcohol/week: 0.0 standard drinks    Comment: rare  . Drug use: No   Family History  Problem Relation Age of Onset  . Heart attack Father 14  . Heart disease Father   . Stroke Mother 50  . Breast cancer Paternal Aunt   . Cancer Paternal Aunt   . Cancer Paternal Aunt   . Hyperlipidemia Unknown        Family history  . Colon cancer Neg Hx   . Colon polyps Neg Hx    Allergies  Allergen Reactions  . Pnu-Imune [Pneumococcal Polysaccharide Vaccine] Hives   Current Outpatient Medications on File Prior to Visit  Medication Sig Dispense Refill  . aspirin EC 81 MG tablet Take 1 tablet (81 mg total) by mouth 2 (two) times daily. For DVT prophylaxis 60 tablet 0  . Calcium Citrate-Vitamin D (CALCIUM + D PO) Take 1 tablet by mouth daily.    . Cholecalciferol (VITAMIN D-3) 1000 UNITS CAPS Take 1,000 Units by  mouth daily.     Marland Kitchen docusate sodium (COLACE) 100 MG capsule Take 1 capsule (100 mg total) by mouth 2 (two) times daily. To prevent constipation while taking pain medication. (Patient taking differently: Take 100 mg by mouth daily as needed. To prevent constipation while taking pain medication.) 60 capsule 0  . methocarbamol (ROBAXIN) 500 MG tablet  Take 1 tablet (500 mg total) by mouth every 6 (six) hours as needed for muscle spasms. 40 tablet 0  . Multiple Vitamin (MULTIVITAMIN WITH MINERALS) TABS tablet Take 1 tablet by mouth every evening.    . [DISCONTINUED] prochlorperazine (COMPAZINE) 10 MG tablet Take 1 tablet (10 mg total) by mouth every 6 (six) hours as needed (Nausea or vomiting). 30 tablet 6   No current facility-administered medications on file prior to visit.     Review of Systems  Constitutional: Negative for activity change, appetite change, fatigue, fever and unexpected weight change.  HENT: Negative for congestion, ear pain, rhinorrhea, sinus pressure and sore throat.   Eyes: Negative for pain, redness and visual disturbance.  Respiratory: Negative for cough, shortness of breath and wheezing.   Cardiovascular: Negative for chest pain and palpitations.  Gastrointestinal: Negative for abdominal pain, blood in stool, constipation and diarrhea.  Endocrine: Negative for polydipsia and polyuria.  Genitourinary: Negative for dysuria, frequency and urgency.  Musculoskeletal: Positive for arthralgias. Negative for back pain and myalgias.       R knee pain is much improved s/p TKA Hips bother her from changed gait -getting better  Skin: Negative for pallor and rash.  Allergic/Immunologic: Negative for environmental allergies.  Neurological: Negative for dizziness, syncope and headaches.  Hematological: Negative for adenopathy. Does not bruise/bleed easily.  Psychiatric/Behavioral: Negative for decreased concentration and dysphoric mood. The patient is not nervous/anxious.          Objective:   Physical Exam  Constitutional: She appears well-developed and well-nourished. No distress.  Well appearing   HENT:  Head: Normocephalic and atraumatic.  Right Ear: External ear normal.  Left Ear: External ear normal.  Mouth/Throat: Oropharynx is clear and moist.  Eyes: Pupils are equal, round, and reactive to light. Conjunctivae and EOM are normal. No scleral icterus.  Neck: Normal range of motion. Neck supple. No JVD present. Carotid bruit is not present. No thyromegaly present.  Cardiovascular: Normal rate, regular rhythm, normal heart sounds and intact distal pulses. Exam reveals no gallop.  Pulmonary/Chest: Effort normal and breath sounds normal. No respiratory distress. She has no wheezes. She exhibits no tenderness. No breast tenderness, discharge or bleeding.  Abdominal: Soft. Bowel sounds are normal. She exhibits no distension, no abdominal bruit and no mass. There is no tenderness.  Genitourinary: No breast tenderness, discharge or bleeding.  Genitourinary Comments: Breast exam: No mass, nodules, thickening, tenderness, bulging, retraction, inflamation, nipple discharge or skin changes noted.  No axillary or clavicular LA.      Musculoskeletal: Normal range of motion. She exhibits no edema or tenderness.  Lymphadenopathy:    She has no cervical adenopathy.  Neurological: She is alert. She has normal reflexes. She displays normal reflexes. No cranial nerve deficit. She exhibits normal muscle tone. Coordination normal.  Skin: Skin is warm and dry. No rash noted. No erythema. No pallor.  Healing scar on R knee- looks good  SKs diffusely Some lentigines   Psychiatric: She has a normal mood and affect.  Pleasant  Mentally sharp today          Assessment & Plan:   Problem List Items Addressed This Visit      Nervous and Auditory   Mild cognitive impairment    No clinical changes  Continue aricept  Rev mini cog         Musculoskeletal and Integument    Osteopenia    On year 5 of fosamax (can stop in a year)  No problems  Due for dexa- ref done No falls or fx  On ca and D Exercising         Other   Dyslipidemia    Disc goals for lipids and reasons to control them Rev last labs with pt Rev low sat fat diet in detail  HDL - coming up with exercise  Controlled with atorvastatin and diet      Relevant Medications   atorvastatin (LIPITOR) 80 MG tablet   Estrogen deficiency   Relevant Orders   DG Bone Density   History of vitamin D deficiency    D level 49 Vitamin D level is therapeutic with current supplementation Disc importance of this to bone and overall health       Prediabetes    Lab Results  Component Value Date   HGBA1C 5.9 08/18/2018   disc imp of low glycemic diet and wt loss to prevent DM2       Routine general medical examination at a health care facility - Primary    Reviewed health habits including diet and exercise and skin cancer prevention Reviewed appropriate screening tests for age  Also reviewed health mt list, fam hx and immunization status , as well as social and family history   See HPI Labs reviewed  amw reviewed  dexa ordered  Enc flu shot in the fall  Enc exercise  Did well with knee replacement

## 2018-09-10 ENCOUNTER — Telehealth: Payer: Self-pay | Admitting: Family Medicine

## 2018-09-10 NOTE — Telephone Encounter (Signed)
Pt notified of Dr. Marliss Coots comments. She will check with pharmacy regarding prices of Tdap

## 2018-09-10 NOTE — Telephone Encounter (Signed)
She needs a Tdap (warn her she may have to pay out of pocket as medicare usually does not cover  Also a flu vaccine if she has not had one this season   Congratulations!

## 2018-09-10 NOTE — Telephone Encounter (Signed)
Copied from [place] [id number]. Topic: Quick Communication - See Telephone Encounter >> Sep 10, 2018 10:09 AM Antonieta Iba C wrote: CRM for notification. See Telephone encounter for: 09/10/18.  Pt's spouse called in to be advised. They are having a new grand baby and they would like to make sure that they have all the immunizations that they need to be around the baby.   Please assist.   CB: [phone]

## 2018-09-20 ENCOUNTER — Other Ambulatory Visit: Payer: Medicare Other

## 2018-09-20 ENCOUNTER — Ambulatory Visit
Admission: RE | Admit: 2018-09-20 | Discharge: 2018-09-20 | Disposition: A | Discharge status: Home / Self Care | Payer: Medicare Other | Source: Ambulatory Visit | Attending: Family Medicine | Admitting: Family Medicine

## 2018-09-20 ENCOUNTER — Ambulatory Visit (INDEPENDENT_AMBULATORY_CARE_PROVIDER_SITE_OTHER)
Admission: RE | Admit: 2018-09-20 | Discharge: 2018-09-20 | Disposition: A | Discharge status: Home / Self Care | Payer: Medicare Other | Source: Ambulatory Visit | Attending: Family Medicine | Admitting: Family Medicine

## 2018-09-20 DIAGNOSIS — E2839 Other primary ovarian failure: Secondary | ICD-10-CM | POA: Diagnosis not present

## 2018-09-20 DIAGNOSIS — Z1231 Encounter for screening mammogram for malignant neoplasm of breast: Secondary | ICD-10-CM

## 2018-09-23 DIAGNOSIS — E2839 Other primary ovarian failure: Secondary | ICD-10-CM | POA: Diagnosis not present

## 2018-09-30 ENCOUNTER — Ambulatory Visit (INDEPENDENT_AMBULATORY_CARE_PROVIDER_SITE_OTHER): Payer: Medicare Other

## 2018-09-30 DIAGNOSIS — Z23 Encounter for immunization: Secondary | ICD-10-CM | POA: Diagnosis not present

## 2018-10-06 ENCOUNTER — Ambulatory Visit: Payer: Medicare Other

## 2018-12-03 ENCOUNTER — Encounter: Payer: Self-pay | Admitting: Family Medicine

## 2018-12-03 ENCOUNTER — Ambulatory Visit: Payer: Medicare Other | Admitting: Family Medicine

## 2018-12-03 VITALS — BP 124/72 | HR 74 | Temp 98.0°F | Ht 63.0 in | Wt 132.0 lb

## 2018-12-03 DIAGNOSIS — R197 Diarrhea, unspecified: Secondary | ICD-10-CM | POA: Insufficient documentation

## 2018-12-03 NOTE — Progress Notes (Signed)
Subjective:    Patient ID: Tara Sloan, female    DOB: 07-02-1946, 72 y.o.   MRN: [id number]  HPI Here with c/o loose stool   It started with intestinal virus on 11/2 Vomiting-brief Then diarrhea  Stool has been loose since then (no longer watery)   2-3 bm per day  No pain - but occ cramping before bm  Urgency to go after eating  Not very hungry  No blood in stool  No black stool   No change with any particular foods  Eats healthy and avoids greasy and spicy foods   She has B cell lymphoma in remission Lab Results  Component Value Date   WBC 9.1 08/18/2018   HGB 13.2 08/18/2018   HCT 40.0 08/18/2018   MCV 89.7 08/18/2018   PLT 215.0 08/18/2018    Last colonoscopy 2016   Wt Readings from Last 3 Encounters:  12/03/18 132 lb (59.9 kg)  08/19/18 131 lb 4 oz (59.5 kg)  08/18/18 131 lb 4 oz (59.5 kg)   23.38 kg/m   Patient Active Problem List   Diagnosis Date Noted  . Diarrhea 12/03/2018  . Pre-operative clearance 05/13/2018  . Knee osteoarthritis 05/03/2018  . Mild cognitive impairment 01/25/2018  . History of vitamin D deficiency 07/31/2017  . Stress reaction 08/04/2016  . Estrogen deficiency 07/04/2016  . Aortic atherosclerosis ([place]) 07/06/2015  . Colon cancer screening 06/29/2015  . Routine general medical examination at a health care facility 06/21/2015  . Chest pain 03/11/2015  . Left bundle branch block (LBBB) 07/06/2014  . Encounter for Medicare annual wellness exam 03/17/2013  . 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 Pain in joint, ankle and foot 03/16/2013  . Prediabetes 12/01/2011  . History of B-cell lymphoma 11/22/2011  . ARTHRITIS, HANDS, BILATERAL 12/12/2010  . Dyslipidemia 04/02/2008  . ALLERGIC RHINITIS 04/02/2008  . GERD 04/02/2008  . Osteopenia 09/13/2007  . ALLERGY 08/06/2007   Past Medical History:  Diagnosis Date  . Allergic rhinitis   . Arthritis    "hands; probably qwhere" (03/12/2015)  . Basal cell carcinoma ~ 2010     "leg"  . Chronic back pain   . 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 External hemorrhoid   . GERD (gastroesophageal reflux disease)   . History of radiation therapy 03/22/12 - 04/09/12   right oropharynx/neck  . HLD (hyperlipidemia)   . LBBB (left bundle branch block) dx'd 05/2014  . Non Hodgkin's lymphoma ([place])   . Osteoarthritis of right knee   . Osteopenia    Past Surgical History:  Procedure Laterality Date  . ABDOMINAL HYSTERECTOMY  1988   "partial; for fibroids"  . BASAL CELL CARCINOMA EXCISION Right ~ 2010   "leg"  . BREAST BIOPSY Left 12/03   Negative  . COLONOSCOPY  5/06   Ext. hemms  . DILATION AND CURETTAGE OF UTERUS  1987  . PARTIAL KNEE ARTHROPLASTY Right 06/08/2018   Procedure: UNICOMPARTMENTAL RIGHT KNEE;  Surgeon: Renette Butters, MD;  Location: [facility];  Service: Orthopedics;  Laterality: Right;  . REPLACEMENT UNICONDYLAR JOINT KNEE Right 06/08/2018  . TONSILLECTOMY  11/07/2011   Procedure: TONSILLECTOMY;  Surgeon: Rozetta Nunnery, MD;  Location: [facility];  Service: ENT;  Laterality: N/A;  . TONSILLECTOMY  10/2011   "right was cancerous"  . TUBAL LIGATION  1987   Social History   Tobacco Use  . Smoking status: Former Smoker    Packs/day:  0.25    Years: 10.00    Pack years: 2.50    Types: Cigarettes    Last attempt to quit: 11/04/1974    Years since quitting: 44.1  . Smokeless tobacco: Never Used  Substance Use Topics  . Alcohol use: Yes    Alcohol/week: 0.0 standard drinks    Comment: rare  . Drug use: No   Family History  Problem Relation Age of Onset  . Heart attack Father 52  . Heart disease Father   . Stroke Mother 58  . Breast cancer Paternal Aunt   . Cancer Paternal Aunt   . Cancer Paternal Aunt   . Hyperlipidemia Unknown        Family history  . Colon cancer Neg Hx   . Colon polyps Neg Hx    Allergies  Allergen Reactions  . Pnu-Imune [Pneumococcal Polysaccharide Vaccine]  Hives   Current Outpatient Medications on File Prior to Visit  Medication Sig Dispense Refill  . alendronate (FOSAMAX) 70 MG tablet Take 1 tablet (70 mg total) by mouth every Wednesday. TAKE 1 TABLET BY MOUTH EVERY 7 DAYS ON AN EMPTY STOMACH WITH A FULL GLASS OF WATER 12 tablet 3  . aspirin EC 81 MG tablet Take 1 tablet (81 mg total) by mouth 2 (two) times daily. For DVT prophylaxis 60 tablet 0  . atorvastatin (LIPITOR) 80 MG tablet Take 1 tablet (80 mg total) by mouth daily at 6 PM. 90 tablet 3  . busPIRone (BUSPAR) 15 MG tablet Take 0.5 tablets (7.5 mg total) by mouth 2 (two) times daily. 90 tablet 3  . Calcium Citrate-Vitamin D (CALCIUM + D PO) Take 1 tablet by mouth daily.    . Cholecalciferol (VITAMIN D-3) 1000 UNITS CAPS Take 1,000 Units by mouth daily.     Marland Kitchen donepezil (ARICEPT) 5 MG tablet Take 1 tablet (5 mg total) by mouth at bedtime. 90 tablet 3  . Multiple Vitamin (MULTIVITAMIN WITH MINERALS) TABS tablet Take 1 tablet by mouth every evening.    Marland Kitchen omeprazole (PRILOSEC) 20 MG capsule TAKE 1 CAPSULE BY MOUTH DAILY AS NEEDED FOR HEARTBURN 90 capsule 1  . [DISCONTINUED] prochlorperazine (COMPAZINE) 10 MG tablet Take 1 tablet (10 mg total) by mouth every 6 (six) hours as needed (Nausea or vomiting). 30 tablet 6   No current facility-administered medications on file prior to visit.     Review of Systems  Constitutional: Negative for activity change, appetite change, fatigue, fever and unexpected weight change.  HENT: Negative for congestion, ear pain, rhinorrhea, sinus pressure and sore throat.   Eyes: Negative for pain, redness and visual disturbance.  Respiratory: Negative for cough, shortness of breath and wheezing.   Cardiovascular: Negative for chest pain and palpitations.  Gastrointestinal: Positive for diarrhea. Negative for abdominal pain, blood in stool, constipation, nausea, rectal pain and vomiting.  Endocrine: Negative for polydipsia and polyuria.  Genitourinary: Negative for  dysuria, frequency and urgency.  Musculoskeletal: Negative for arthralgias, back pain and myalgias.  Skin: Negative for pallor and rash.  Allergic/Immunologic: Negative for environmental allergies.  Neurological: Negative for dizziness, syncope and headaches.  Hematological: Negative for adenopathy. Does not bruise/bleed easily.  Psychiatric/Behavioral: Negative for decreased concentration and dysphoric mood. The patient is not nervous/anxious.        Objective:   Physical Exam Constitutional:      General: She is not in acute distress.    Appearance: She is well-developed.     Comments: Well appearing   HENT:  Head: Normocephalic and atraumatic.  Eyes:     General: No scleral icterus.    Conjunctiva/sclera: Conjunctivae normal.     Pupils: Pupils are equal, round, and reactive to light.  Neck:     Musculoskeletal: Normal range of motion and neck supple.  Cardiovascular:     Rate and Rhythm: Normal rate and regular rhythm.     Heart sounds: Normal heart sounds.  Pulmonary:     Effort: Pulmonary effort is normal. No respiratory distress.     Breath sounds: Normal breath sounds. No wheezing or rales.  Abdominal:     General: Bowel sounds are normal. There is no distension.     Palpations: Abdomen is soft. There is no mass.     Tenderness: There is no abdominal tenderness. There is no right CVA tenderness, left CVA tenderness, guarding or rebound.     Comments: bs are not hyperactive or high pitched  Lymphadenopathy:     Cervical: No cervical adenopathy.  Skin:    General: Skin is warm and dry.     Coloration: Skin is not pale.     Findings: No erythema.  Neurological:     Mental Status: She is alert.           Assessment & Plan:   Problem List Items Addressed This Visit      Other   Diarrhea - Primary    Suspect bacterial overgrowth/imbalance after gastroenteritis last month Disc imp of fluids Can try inc fiber in diet to bulk up stool  Alert [place] if  pain/fever  Start Align otc daily (probiotic)    Cannot have yogurt due to lactose intol Update in a month-if no further improvement will get labs and eval further

## 2018-12-03 NOTE — Patient Instructions (Addendum)
It sounds like you have a bacterial imbalance in your GI  This is very common  caused by the virus you had last month I expect it will gradually improve   Get Align over the counter - it is a probiotic to help GI function  Also be sure to eat some fiber to bulk up stool Avoid foods that make you worse   If not improved in a month let me know  If worse or pain or fever- let me know right away

## 2018-12-03 NOTE — Assessment & Plan Note (Signed)
Suspect bacterial overgrowth/imbalance after gastroenteritis last month Disc imp of fluids Can try inc fiber in diet to bulk up stool  Alert [place] if pain/fever  Start Align otc daily (probiotic)    Cannot have yogurt due to lactose intol Update in a month-if no further improvement will get labs and eval further

## 2019-04-26 ENCOUNTER — Telehealth: Payer: Self-pay

## 2019-04-26 NOTE — Telephone Encounter (Signed)
Spoke with patient regarding PRN F/U with Dr. Rockey Situ. Patient states she is doing well and does not feel she needs F/U cardiac care at this time. Patient states she really liked Dr. Rockey Situ and will call our office for future cardiac care if needed. Recall removed per patient request.

## 2019-05-29 ENCOUNTER — Other Ambulatory Visit: Payer: Self-pay | Admitting: Family Medicine

## 2019-07-08 ENCOUNTER — Other Ambulatory Visit: Payer: Self-pay | Admitting: *Deleted

## 2019-07-08 MED ORDER — OMEPRAZOLE 20 MG PO CPDR: DELAYED_RELEASE_CAPSULE | ORAL | 0 refills | Status: DC

## 2019-07-19 ENCOUNTER — Other Ambulatory Visit: Payer: Self-pay | Admitting: *Deleted

## 2019-07-19 NOTE — Telephone Encounter (Signed)
Faxed refill request. 1st refill was on 07/07/2014, it's at the 5 yr mark, please advise, CPE scheduled on 08/26/19

## 2019-07-19 NOTE — Telephone Encounter (Signed)
Please tell her it has been 5 years so she can stop it for now   (we will take a drug holiday from it)

## 2019-07-20 NOTE — Telephone Encounter (Signed)
Pt notified and fosamax removed from med list

## 2019-08-12 ENCOUNTER — Other Ambulatory Visit: Payer: Self-pay

## 2019-08-12 MED ORDER — ATORVASTATIN CALCIUM 80 MG PO TABS: 80.0000 mg | ORAL_TABLET | Freq: Every day | ORAL | 0 refills | Status: DC

## 2019-08-22 ENCOUNTER — Telehealth: Payer: Self-pay | Admitting: Family Medicine

## 2019-08-22 ENCOUNTER — Ambulatory Visit: Payer: Medicare Other

## 2019-08-22 DIAGNOSIS — Z8639 Personal history of other endocrine, nutritional and metabolic disease: Secondary | ICD-10-CM

## 2019-08-22 DIAGNOSIS — E785 Hyperlipidemia, unspecified: Secondary | ICD-10-CM

## 2019-08-22 DIAGNOSIS — Z0000 Encounter for general adult medical examination without abnormal findings: Secondary | ICD-10-CM

## 2019-08-22 DIAGNOSIS — R7303 Prediabetes: Secondary | ICD-10-CM

## 2019-08-22 NOTE — Telephone Encounter (Signed)
-----   Message from Ellamae Sia sent at 08/16/2019 11:12 AM EDT ----- Regarding: Lab orders for Tuesday, 9.1.20 Patient is scheduled for CPX labs, please order future labs, Thanks , Karna Christmas

## 2019-08-23 ENCOUNTER — Other Ambulatory Visit: Payer: Self-pay | Admitting: Family Medicine

## 2019-08-23 ENCOUNTER — Other Ambulatory Visit (INDEPENDENT_AMBULATORY_CARE_PROVIDER_SITE_OTHER): Payer: Medicare Other

## 2019-08-23 DIAGNOSIS — E785 Hyperlipidemia, unspecified: Secondary | ICD-10-CM | POA: Diagnosis not present

## 2019-08-23 DIAGNOSIS — R7303 Prediabetes: Secondary | ICD-10-CM | POA: Diagnosis not present

## 2019-08-23 DIAGNOSIS — Z0000 Encounter for general adult medical examination without abnormal findings: Secondary | ICD-10-CM | POA: Diagnosis not present

## 2019-08-23 DIAGNOSIS — Z8639 Personal history of other endocrine, nutritional and metabolic disease: Secondary | ICD-10-CM

## 2019-08-23 DIAGNOSIS — Z1231 Encounter for screening mammogram for malignant neoplasm of breast: Secondary | ICD-10-CM

## 2019-08-23 LAB — CBC WITH DIFFERENTIAL/PLATELET
Basophils Absolute: 0 10*3/uL (ref 0.0–0.1)
Basophils Relative: 0.5 % (ref 0.0–3.0)
Eosinophils Absolute: 0 10*3/uL (ref 0.0–0.7)
Eosinophils Relative: 0.5 % (ref 0.0–5.0)
HCT: 42 % (ref 36.0–46.0)
Hemoglobin: 13.9 g/dL (ref 12.0–15.0)
Lymphocytes Relative: 21.2 % (ref 12.0–46.0)
Lymphs Abs: 1.6 10*3/uL (ref 0.7–4.0)
MCHC: 33.2 g/dL (ref 30.0–36.0)
MCV: 90.1 fl (ref 78.0–100.0)
Monocytes Absolute: 0.7 10*3/uL (ref 0.1–1.0)
Monocytes Relative: 8.9 % (ref 3.0–12.0)
Neutro Abs: 5.2 10*3/uL (ref 1.4–7.7)
Neutrophils Relative %: 68.9 % (ref 43.0–77.0)
Platelets: 230 10*3/uL (ref 150.0–400.0)
RBC: 4.66 Mil/uL (ref 3.87–5.11)
RDW: 14.4 % (ref 11.5–15.5)
WBC: 7.6 10*3/uL (ref 4.0–10.5)

## 2019-08-23 LAB — TSH: TSH: 2.12 u[IU]/mL (ref 0.35–4.50)

## 2019-08-23 LAB — COMPREHENSIVE METABOLIC PANEL
ALT: 14 U/L (ref 0–35)
AST: 21 U/L (ref 0–37)
Albumin: 3.9 g/dL (ref 3.5–5.2)
Alkaline Phosphatase: 49 U/L (ref 39–117)
BUN: 25 mg/dL — ABNORMAL HIGH (ref 6–23)
CO2: 25 mEq/L (ref 19–32)
Calcium: 9.2 mg/dL (ref 8.4–10.5)
Chloride: 106 mEq/L (ref 96–112)
Creatinine, Ser: 0.86 mg/dL (ref 0.40–1.20)
GFR: 64.54 mL/min (ref >60.00)
Glucose, Bld: 99 mg/dL (ref 70–99)
Potassium: 4 mEq/L (ref 3.5–5.1)
Sodium: 140 mEq/L (ref 135–145)
Total Bilirubin: 0.6 mg/dL (ref 0.2–1.2)
Total Protein: 6.8 g/dL (ref 6.0–8.3)

## 2019-08-23 LAB — LIPID PANEL
Cholesterol: 132 mg/dL (ref 0–200)
HDL: 44.6 mg/dL (ref >39.00)
LDL Cholesterol: 64 mg/dL (ref 0–99)
NonHDL: 87.78
Total CHOL/HDL Ratio: 3
Triglycerides: 121 mg/dL (ref 0.0–149.0)
VLDL: 24.2 mg/dL (ref 0.0–40.0)

## 2019-08-23 LAB — HEMOGLOBIN A1C: Hgb A1c MFr Bld: 6.1 % (ref 4.6–6.5)

## 2019-08-23 LAB — VITAMIN D 25 HYDROXY (VIT D DEFICIENCY, FRACTURES): VITD: 51.26 ng/mL (ref 30.00–100.00)

## 2019-08-26 ENCOUNTER — Other Ambulatory Visit: Payer: Self-pay

## 2019-08-26 ENCOUNTER — Encounter: Payer: Self-pay | Admitting: Family Medicine

## 2019-08-26 ENCOUNTER — Ambulatory Visit (INDEPENDENT_AMBULATORY_CARE_PROVIDER_SITE_OTHER): Payer: Medicare Other | Admitting: Family Medicine

## 2019-08-26 VITALS — BP 116/64 | HR 80 | Temp 98.4°F | Ht 62.75 in | Wt 121.2 lb

## 2019-08-26 DIAGNOSIS — Z23 Encounter for immunization: Secondary | ICD-10-CM | POA: Diagnosis not present

## 2019-08-26 DIAGNOSIS — I700 Atherosclerosis of aorta: Secondary | ICD-10-CM

## 2019-08-26 DIAGNOSIS — Z0000 Encounter for general adult medical examination without abnormal findings: Secondary | ICD-10-CM

## 2019-08-26 DIAGNOSIS — G3184 Mild cognitive impairment, so stated: Secondary | ICD-10-CM

## 2019-08-26 DIAGNOSIS — M85859 Other specified disorders of bone density and structure, unspecified thigh: Secondary | ICD-10-CM

## 2019-08-26 DIAGNOSIS — Z8639 Personal history of other endocrine, nutritional and metabolic disease: Secondary | ICD-10-CM | POA: Diagnosis not present

## 2019-08-26 DIAGNOSIS — Z8572 Personal history of non-Hodgkin lymphomas: Secondary | ICD-10-CM

## 2019-08-26 DIAGNOSIS — E785 Hyperlipidemia, unspecified: Secondary | ICD-10-CM

## 2019-08-26 MED ORDER — BUSPIRONE HCL 15 MG PO TABS: 7.5000 mg | ORAL_TABLET | Freq: Two times a day (BID) | ORAL | 3 refills | Status: DC

## 2019-08-26 MED ORDER — ATORVASTATIN CALCIUM 80 MG PO TABS: 80.0000 mg | ORAL_TABLET | Freq: Every day | ORAL | 3 refills | Status: DC

## 2019-08-26 MED ORDER — DONEPEZIL HCL 5 MG PO TABS: 5.0000 mg | ORAL_TABLET | Freq: Every day | ORAL | 3 refills | Status: DC

## 2019-08-26 MED ORDER — OMEPRAZOLE 20 MG PO CPDR: DELAYED_RELEASE_CAPSULE | ORAL | 3 refills | Status: DC

## 2019-08-26 NOTE — Patient Instructions (Addendum)
Flu shot high dose   Replace your [employer] exercise with something at home or outdoors  Videos are good  (for instance yoga)   Makes sure to drink enough fluids   Call and let me know if /when you want to increase aricept dose   Be sure to eat enough protein /keep your calories up

## 2019-08-26 NOTE — Progress Notes (Signed)
Subjective:    Patient ID: Tara Sloan, female    DOB: 1946-03-17, 73 y.o.   MRN: [id number]  HPI Here for amw and routine health mt exam with ref of chronic health problems   I have personally reviewed the Medicare Annual Wellness questionnaire and have noted 1. The patient's medical and social history 2. Their use of alcohol, tobacco or illicit drugs 3. Their current medications and supplements 4. The patient's functional ability including ADL's, fall risks, home safety risks and hearing or visual             impairment. 5. Diet and physical activities 6. Evidence for depression or mood disorders  The patients weight, height, BMI have been recorded in the chart and visual acuity is per eye clinic.  I have made referrals, counseling and provided education to the patient based review of the above and I have provided the pt with a written personalized care plan for preventive services. Reviewed and updated provider list, see scanned forms.  See scanned forms.  Routine anticipatory guidance given to patient.  See health maintenance. Colon cancer screening colonoscopy 9/16  Breast cancer screening  Has mammogram scheduled for 10/07/19 Self breast exam- no lumps or changes  Flu vaccine-will get today Tetanus vaccine   Td 3/14 Pneumovax completed Zoster vaccine  zostavax 10/13 Dexa 9/19 (mild improved osteopenia)  Finished 5 y of fosamax [place]-none  Fractures- none  Supplements-takes vit D  D level is 51.2 -good  Exercise -works outside and walks   Advance directive- has living will and pos  Cognitive function addressed- see scanned forms- and if abnormal then additional documentation follows.  Has mild cog impairment  Takes aricept -no problems / does not want to increase dose yet No changes overall /nothing she notices  No confusion    PMH and SH reviewed  Meds, vitals, and allergies reviewed.   ROS: See HPI.  Otherwise negative.    Doing well overall  Cares  for 9 mo grandchild often    Weight : Wt Readings from Last 3 Encounters:  08/26/19 121 lb 4 oz (55 kg)  12/03/18 132 lb (59.9 kg)  08/19/18 131 lb 4 oz (59.5 kg)  [facility]  (this keeps her weight on and helps her appetite) is eating  She eats regularly- does not skip  Not a sweet eater  21.65 kg/m   Hearing/vision:  Hearing Screening   125Hz  250Hz  500Hz  1000Hz  2000Hz  3000Hz  4000Hz  6000Hz  8000Hz   Right ear:   40 40 40  40    Left ear:   40 0 40  40    Vision Screening Comments: Pt had eye exam in May 2020 at [facility]     H/o B cell lyphoma in the past Lab Results  Component Value Date   WBC 7.6 08/23/2019   HGB 13.9 08/23/2019   HCT 42.0 08/23/2019   MCV 90.1 08/23/2019   PLT 230.0 08/23/2019    Hyperlipidemia Lab Results  Component Value Date   CHOL 132 08/23/2019   CHOL 131 08/18/2018   CHOL 127 07/27/2017   Lab Results  Component Value Date   HDL 44.60 08/23/2019   HDL 37.20 (L) 08/18/2018   HDL 32 (L) 07/27/2017   Lab Results  Component Value Date   LDLCALC 64 08/23/2019   LDLCALC 54 08/18/2018   LDLCALC 67 07/27/2017   Lab Results  Component Value Date   TRIG 121.0 08/23/2019   TRIG 198.0 (  H) 08/18/2018   TRIG 142 07/27/2017   Lab Results  Component Value Date   CHOLHDL 3 08/23/2019   CHOLHDL 4 08/18/2018   CHOLHDL 4.0 07/27/2017   Lab Results  Component Value Date   LDLDIRECT 92.2 03/02/2012   Atorvastatin and diet Very good control    Prediabetes Lab Results  Component Value Date   HGBA1C 6.1 08/23/2019  up from 5.9 Does not eat sweets   Lab Results  Component Value Date   CREATININE 0.86 08/23/2019   BUN 25 (H) 08/23/2019   NA 140 08/23/2019   K 4.0 08/23/2019   CL 106 08/23/2019   CO2 25 08/23/2019    Lab Results  Component Value Date   ALT 14 08/23/2019   AST 21 08/23/2019   ALKPHOS 49 08/23/2019   BILITOT 0.6 08/23/2019    Lab Results  Component Value Date   TSH 2.12 08/23/2019      PHQ: 0  Mood has been good   Patient Active Problem List   Diagnosis Date Noted  . Diarrhea 12/03/2018  . Pre-operative clearance 05/13/2018  . Knee osteoarthritis 05/03/2018  . Mild cognitive impairment 01/25/2018  . History of vitamin D deficiency 07/31/2017  . Stress reaction 08/04/2016  . Estrogen deficiency 07/04/2016  . Aortic atherosclerosis ([place]) 07/06/2015  . Colon cancer screening 06/29/2015  . Routine general medical examination at a health care facility 06/21/2015  . Chest pain 03/11/2015  . Left bundle branch block (LBBB) 07/06/2014  . Encounter for Medicare annual wellness exam 03/17/2013  . 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 Pain in joint, ankle and foot 03/16/2013  . Prediabetes 12/01/2011  . History of B-cell lymphoma 11/22/2011  . ARTHRITIS, HANDS, BILATERAL 12/12/2010  . Dyslipidemia 04/02/2008  . ALLERGIC RHINITIS 04/02/2008  . GERD 04/02/2008  . Osteopenia 09/13/2007  . ALLERGY 08/06/2007   Past Medical History:  Diagnosis Date  . Allergic rhinitis   . Arthritis    "hands; probably qwhere" (03/12/2015)  . Basal cell carcinoma ~ 2010   "leg"  . Chronic back pain   . 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 External hemorrhoid   . GERD (gastroesophageal reflux disease)   . History of radiation therapy 03/22/12 - 04/09/12   right oropharynx/neck  . HLD (hyperlipidemia)   . LBBB (left bundle branch block) dx'd 05/2014  . Non Hodgkin's lymphoma ([place])   . Osteoarthritis of right knee   . Osteopenia    Past Surgical History:  Procedure Laterality Date  . ABDOMINAL HYSTERECTOMY  1988   "partial; for fibroids"  . BASAL CELL CARCINOMA EXCISION Right ~ 2010   "leg"  . BREAST BIOPSY Left 12/03   Negative  . COLONOSCOPY  5/06   Ext. hemms  . DILATION AND CURETTAGE OF UTERUS  1987  . PARTIAL KNEE ARTHROPLASTY Right 06/08/2018   Procedure: UNICOMPARTMENTAL RIGHT KNEE;  Surgeon: Renette Butters, MD;  Location: [facility];   Service: Orthopedics;  Laterality: Right;  . REPLACEMENT UNICONDYLAR JOINT KNEE Right 06/08/2018  . TONSILLECTOMY  11/07/2011   Procedure: TONSILLECTOMY;  Surgeon: Rozetta Nunnery, MD;  Location: [facility];  Service: ENT;  Laterality: N/A;  . TONSILLECTOMY  10/2011   "right was cancerous"  . TUBAL LIGATION  1987   Social History   Tobacco Use  . Smoking status: Former Smoker    Packs/day: 0.25    Years: 10.00    Pack years: 2.50  Types: Cigarettes    Quit date: 11/04/1974    Years since quitting: 44.8  . Smokeless tobacco: Never Used  Substance Use Topics  . Alcohol use: Yes    Alcohol/week: 0.0 standard drinks    Comment: rare  . Drug use: No   Family History  Problem Relation Age of Onset  . Heart attack Father 57  . Heart disease Father   . Stroke Mother 71  . Breast cancer Paternal Aunt   . Cancer Paternal Aunt   . Cancer Paternal Aunt   . Hyperlipidemia Unknown        Family history  . Colon cancer Neg Hx   . Colon polyps Neg Hx    Allergies  Allergen Reactions  . Pnu-Imune [Pneumococcal Polysaccharide Vaccine] Hives   Current Outpatient Medications on File Prior to Visit  Medication Sig Dispense Refill  . aspirin EC 81 MG tablet Take 1 tablet (81 mg total) by mouth 2 (two) times daily. For DVT prophylaxis 60 tablet 0  . Calcium Citrate-Vitamin D (CALCIUM + D PO) Take 1 tablet by mouth daily.    . Cholecalciferol (VITAMIN D-3) 1000 UNITS CAPS Take 1,000 Units by mouth daily.     . Multiple Vitamin (MULTIVITAMIN WITH MINERALS) TABS tablet Take 1 tablet by mouth every evening.    . [DISCONTINUED] prochlorperazine (COMPAZINE) 10 MG tablet Take 1 tablet (10 mg total) by mouth every 6 (six) hours as needed (Nausea or vomiting). 30 tablet 6   No current facility-administered medications on file prior to visit.     Review of Systems  Constitutional: Negative for activity change, appetite change, fatigue, fever and unexpected weight change.   HENT: Negative for congestion, ear pain, rhinorrhea, sinus pressure and sore throat.   Eyes: Negative for pain, redness and visual disturbance.  Respiratory: Negative for cough, shortness of breath and wheezing.   Cardiovascular: Negative for chest pain and palpitations.  Gastrointestinal: Negative for abdominal pain, blood in stool, constipation and diarrhea.  Endocrine: Negative for polydipsia and polyuria.  Genitourinary: Negative for dysuria, frequency and urgency.  Musculoskeletal: Negative for arthralgias, back pain and myalgias.  Skin: Negative for pallor and rash.  Allergic/Immunologic: Negative for environmental allergies.  Neurological: Negative for dizziness, syncope and headaches.  Hematological: Negative for adenopathy. Does not bruise/bleed easily.  Psychiatric/Behavioral: Negative for decreased concentration and dysphoric mood. The patient is not nervous/anxious.        Occ forgetful No problems with concentration        Objective:   Physical Exam Constitutional:      General: She is not in acute distress.    Appearance: Normal appearance. She is well-developed and normal weight. She is not ill-appearing or diaphoretic.  HENT:     Head: Normocephalic and atraumatic.     Right Ear: Tympanic membrane, ear canal and external ear normal.     Left Ear: Tympanic membrane, ear canal and external ear normal.     Nose: Nose normal. No congestion.     Mouth/Throat:     Mouth: Mucous membranes are moist.     Pharynx: Oropharynx is clear. No posterior oropharyngeal erythema.  Eyes:     General: No scleral icterus.    Extraocular Movements: Extraocular movements intact.     Conjunctiva/sclera: Conjunctivae normal.     Pupils: Pupils are equal, round, and reactive to light.  Neck:     Musculoskeletal: Normal range of motion and neck supple. No neck rigidity or muscular tenderness.     Thyroid:  No thyromegaly.     Vascular: No carotid bruit or JVD.  Cardiovascular:     Rate  and Rhythm: Normal rate and regular rhythm.     Pulses: Normal pulses.     Heart sounds: Normal heart sounds. No gallop.   Pulmonary:     Effort: Pulmonary effort is normal. No respiratory distress.     Breath sounds: Normal breath sounds. No wheezing.     Comments: Good air exch Chest:     Chest wall: No tenderness.  Abdominal:     General: Bowel sounds are normal. There is no distension or abdominal bruit.     Palpations: Abdomen is soft. There is no mass.     Tenderness: There is no abdominal tenderness.     Hernia: No hernia is present.  Genitourinary:    Comments: Breast exam: No mass, nodules, thickening, tenderness, bulging, retraction, inflamation, nipple discharge or skin changes noted.  No axillary or clavicular LA.     Musculoskeletal: Normal range of motion.        General: No tenderness.     Right lower leg: No edema.     Left lower leg: No edema.  Lymphadenopathy:     Cervical: No cervical adenopathy.  Skin:    General: Skin is warm and dry.     Coloration: Skin is not pale.     Findings: No erythema or rash.     Comments: Solar lentigines diffusely Some sks  Neurological:     Mental Status: She is alert. Mental status is at baseline.     Cranial Nerves: No cranial nerve deficit.     Motor: No abnormal muscle tone.     Coordination: Coordination normal.     Gait: Gait normal.     Deep Tendon Reflexes: Reflexes are normal and symmetric.  Psychiatric:        Mood and Affect: Mood normal.        Cognition and Memory: Cognition and memory normal.     Comments: Memory and cognition are normal today           Assessment & Plan:   Problem List Items Addressed This Visit      Cardiovascular and Mediastinum   Aortic atherosclerosis ([place])    Monitored by cardiology No clinical changes      Relevant Medications   atorvastatin (LIPITOR) 80 MG tablet     Musculoskeletal and Integument   Osteopenia    dexa 9/19 improved  Finished 5 y of fosamax No falls  or fx tx vit D level  Also exercises  r e check 1 y        Other   Dyslipidemia    Well controlled with atorvastatin and diet  Disc goals for lipids and reasons to control them Rev last labs with pt Rev low sat fat diet in detail       Relevant Medications   atorvastatin (LIPITOR) 80 MG tablet   History of B-cell lymphoma    No signs of re occurrence  Oncology signed off Will continue to monitor      Encounter for Medicare annual wellness exam - Primary    Reviewed health habits including diet and exercise and skin cancer prevention Reviewed appropriate screening tests for age  Also reviewed health mt list, fam hx and immunization status , as well as social and family history   See HPI Labs reviewed Mammogram planned for oct Flu vaccine today  Pt has advance directive  Cognition is  stable (mild cog imp on aricept low dose)-family monitors her Wt is down- enc muscle building exercise at home and more protein calories Hearing test re assuring utd eye exam          Routine general medical examination at a health care facility    Reviewed health habits including diet and exercise and skin cancer prevention Reviewed appropriate screening tests for age  Also reviewed health mt list, fam hx and immunization status , as well as social and family history   See HPI Labs reviewed Mammogram planned for oct Flu vaccine today  Pt has advance directive  Cognition is stable (mild cog imp on aricept low dose)-family monitors her Wt is down- enc muscle building exercise at home and more protein calories Hearing test re assuring utd eye exam      History of vitamin D deficiency    Vitamin D level is therapeutic with current supplementation Disc importance of this to bone and overall health Level in 50s      Mild cognitive impairment    Per pt no clinical changes  Seems stable on exam as well  Continue aricept 5 mg with option to inc to 10 any time Continue  socialization/puzzles/math       Other Visit Diagnoses    Need for influenza vaccination       Relevant Orders   Flu Vaccine QUAD High Dose(Fluad) (Completed)

## 2019-08-28 NOTE — Assessment & Plan Note (Signed)
dexa 9/19 improved  Finished 5 y of fosamax No falls or fx tx vit D level  Also exercises  r e check 1 y

## 2019-08-28 NOTE — Assessment & Plan Note (Signed)
Vitamin D level is therapeutic with current supplementation Disc importance of this to bone and overall health Level in 50s 

## 2019-08-28 NOTE — Assessment & Plan Note (Signed)
Monitored by cardiology No clinical changes

## 2019-08-28 NOTE — Assessment & Plan Note (Signed)
Per pt no clinical changes  Seems stable on exam as well  Continue aricept 5 mg with option to inc to 10 any time Continue socialization/puzzles/math

## 2019-08-28 NOTE — Assessment & Plan Note (Signed)
Well controlled with atorvastatin and diet Disc goals for lipids and reasons to control them Rev last labs with pt Rev low sat fat diet in detail  

## 2019-08-28 NOTE — Assessment & Plan Note (Signed)
No signs of re occurrence  Oncology signed off Will continue to monitor

## 2019-08-28 NOTE — Assessment & Plan Note (Signed)
Reviewed health habits including diet and exercise and skin cancer prevention Reviewed appropriate screening tests for age  Also reviewed health mt list, fam hx and immunization status , as well as social and family history   See HPI Labs reviewed Mammogram planned for oct Flu vaccine today  Pt has advance directive  Cognition is stable (mild cog imp on aricept low dose)-family monitors her Wt is down- enc muscle building exercise at home and more protein calories Hearing test re assuring utd eye exam

## 2019-10-07 ENCOUNTER — Other Ambulatory Visit: Payer: Self-pay

## 2019-10-07 ENCOUNTER — Ambulatory Visit
Admission: RE | Admit: 2019-10-07 | Discharge: 2019-10-07 | Disposition: A | Discharge status: Home / Self Care | Payer: Medicare Other | Source: Ambulatory Visit | Attending: Family Medicine | Admitting: Family Medicine

## 2019-10-07 DIAGNOSIS — Z1231 Encounter for screening mammogram for malignant neoplasm of breast: Secondary | ICD-10-CM | POA: Diagnosis not present

## 2020-01-17 ENCOUNTER — Ambulatory Visit: Payer: Medicare Other

## 2020-01-26 ENCOUNTER — Ambulatory Visit: Payer: Medicare Other | Attending: Internal Medicine

## 2020-01-26 DIAGNOSIS — Z23 Encounter for immunization: Secondary | ICD-10-CM

## 2020-01-26 NOTE — Progress Notes (Signed)
   Covid-19 Vaccination Clinic  Name:  Tara Sloan    MRN: [id number] DOB: 1946-01-03  01/26/2020  Tara Sloan was observed post Covid-19 immunization for 15 minutes without incidence. She was provided with Vaccine Information Sheet and instruction to access the V-Safe system.   Tara Sloan was instructed to call 911 with any severe reactions post vaccine: Marland Kitchen Difficulty breathing  . Swelling of your face and throat  . A fast heartbeat  . A bad rash all over your body  . Dizziness and weakness    Immunizations Administered    Name Date Dose VIS Date Route   Pfizer COVID-19 Vaccine 01/26/2020 10:11 AM 0.3 mL 12/02/2019 Intramuscular   Manufacturer: [place]   Lot: [id number]   [place]: [id number]

## 2020-01-28 ENCOUNTER — Ambulatory Visit: Payer: Medicare Other

## 2020-02-20 ENCOUNTER — Ambulatory Visit: Payer: Medicare Other | Attending: Internal Medicine

## 2020-02-20 DIAGNOSIS — Z23 Encounter for immunization: Secondary | ICD-10-CM

## 2020-02-20 NOTE — Progress Notes (Signed)
   Covid-19 Vaccination Clinic  Name:  Tara Sloan    MRN: [id number] DOB: 08/21/1946  02/20/2020  Tara Sloan was observed post Covid-19 immunization for 15 minutes without incidence. She was provided with Vaccine Information Sheet and instruction to access the V-Safe system.   Tara Sloan was instructed to call 911 with any severe reactions post vaccine: Marland Kitchen Difficulty breathing  . Swelling of your face and throat  . A fast heartbeat  . A bad rash all over your body  . Dizziness and weakness    Immunizations Administered    Name Date Dose VIS Date Route   Pfizer COVID-19 Vaccine 02/20/2020  2:32 PM 0.3 mL 12/02/2019 Intramuscular   Manufacturer: [place]   Lot: [id number]   [place]: [id number]

## 2020-02-27 ENCOUNTER — Telehealth: Payer: Self-pay | Admitting: *Deleted

## 2020-02-27 MED ORDER — DONEPEZIL HCL 10 MG PO TABS: 10.0000 mg | ORAL_TABLET | Freq: Every day | ORAL | 3 refills | Status: DC

## 2020-02-27 NOTE — Telephone Encounter (Signed)
Left VM letting pt's son know Rx was sent and advised on VM of Dr. Marliss Coots comments

## 2020-02-27 NOTE — Telephone Encounter (Signed)
Patient's [name] left a voicemail requesting that Dr. Glori Bickers increase his mom's memory medication. Tara Sloan stated that his mom seems to be struggling more. Pharmacy [facility]

## 2020-02-27 NOTE — Telephone Encounter (Signed)
Thanks for letting me know  We will increase aricept from 5 to 10 mg daily  I sent it   Keep me posted (especially if any side effects)

## 2020-03-20 DIAGNOSIS — Z0120 Encounter for dental examination and cleaning without abnormal findings: Secondary | ICD-10-CM | POA: Diagnosis not present

## 2020-06-18 ENCOUNTER — Telehealth: Payer: Self-pay | Admitting: Family Medicine

## 2020-06-18 NOTE — Telephone Encounter (Signed)
Hara with [facility] called with questions regarding a medication.  Pt has not filled her Atorvastatin since 01/12/20 They show that she transferred out to [facility] but they wanted to ensure that she was still receiving this medication even if at another pharmacy. Lindon Romp attempted to reach the patient but was unable to get in touch with her.   Per the chart, [id number] [place], [address]  [place], [place] [address]   08/26/2019  #90 x 3 refills  Please advise, thanks.

## 2020-06-21 NOTE — Telephone Encounter (Signed)
Please advise, thanks.

## 2020-06-24 NOTE — Telephone Encounter (Signed)
Please try and reach her again next week

## 2020-08-28 ENCOUNTER — Telehealth: Payer: Self-pay

## 2020-08-28 DIAGNOSIS — N390 Urinary tract infection, site not specified: Secondary | ICD-10-CM | POA: Diagnosis not present

## 2020-08-28 NOTE — Telephone Encounter (Signed)
I spoke with pt; pt did not go anywhere so far; pt said she is leaving now to go to [facility] in [place]. FYI to Dr Glori Bickers.

## 2020-08-28 NOTE — Telephone Encounter (Signed)
[place] Night - Client TELEPHONE ADVICE RECORD [facility] Patient Name: Tara Sloan Gender: Female DOB: [birth year] Age: 74 Y 9 M 23 D Return Phone Number: [phone] (Primary), [phone] (Secondary) [name] [address] Client [place] Night - Client Client Site [place] [facility], Roque Lias - MD Contact Type Call Who Is Calling Patient / Member / Family / Caregiver Call Type Triage / Clinical Relationship To Patient Self Return Phone Number [phone] (Primary) Chief Complaint Urination Pain Reason for Call Symptomatic / Request for [name] states she has a painful urination and it is really yellow Translation No Nurse Assessment Nurse: Jimmye Norman, RN, [place] Date/Time (Eastern Time): 08/25/2020 8:23:51 AM Confirm and document reason for call. If symptomatic, describe symptoms. ---Caller states she has a painful urination and it is really yellow symptoms started this morning. Has the patient had close contact with a person known or suspected to have the novel coronavirus illness OR traveled / lives in area with major community spread (including international travel) in the last 14 days from the onset of symptoms? * If Asymptomatic, screen for exposure and travel within the last 14 days. ---No Does the patient have any new or worsening symptoms? ---Yes Will a triage be completed? ---Yes Related visit to physician within the last 2 weeks? ---No Does the PT have any chronic conditions? (i.e. diabetes, asthma, this includes High risk factors for pregnancy, etc.) ---No Is this a behavioral health or substance abuse call? ---No Guidelines Guideline Title Affirmed Question Affirmed Notes Nurse Date/Time Eilene Ghazi Time) Urination Pain - Female Age > 66 years Jimmye Norman, Therapist, sports, Loree Fee 08/25/2020 8:25:23 AM Disp. Time Eilene Ghazi  Time) Disposition Final User 08/25/2020 8:30:08 AM See PCP within 24 Hours Yes Jimmye Norman, RN, [employer] Caller Disagree/Comply Comply PLEASE NOTE: All timestamps contained within this report are represented as [place] Standard Time. CONFIDENTIALTY NOTICE: This fax transmission is intended only for the addressee. It contains information that is legally privileged, confidential or otherwise protected from use or disclosure. If you are not the intended recipient, you are strictly prohibited from reviewing, disclosing, copying using or disseminating any of this information or taking any action in reliance on or regarding this information. If you have received this fax in error, please notify us immediately by telephone so that we can arrange for its return to [place]. Phone: [phone], Toll-Free: [phone], Fax: [phone] Page: 2 of 2 Call Id: [id number] [place] Understands Yes PreDisposition InappropriateToAsk Care Advice Given Per Guideline SEE PCP WITHIN 24 HOURS: * IF OFFICE WILL BE OPEN: You need to be examined within the next 24 hours. Call your doctor (or NP/PA) when the office opens and make an appointment. REASSURANCE AND EDUCATION: * This could be an urinary tract infection. Referrals [facility]

## 2020-09-27 DIAGNOSIS — Z0120 Encounter for dental examination and cleaning without abnormal findings: Secondary | ICD-10-CM | POA: Diagnosis not present

## 2020-10-16 ENCOUNTER — Other Ambulatory Visit: Payer: Self-pay | Admitting: Family Medicine

## 2020-10-17 NOTE — Telephone Encounter (Signed)
Pt hasn't been seen in over a year and no future appts., please advise  

## 2020-10-17 NOTE — Telephone Encounter (Signed)
Please schedule annual exam for fall or winter and refill until then

## 2020-10-18 NOTE — Telephone Encounter (Signed)
Med refilled once and Carrie will reach out to pt to try and get appt scheduled  

## 2020-10-23 NOTE — Telephone Encounter (Signed)
Per chart review tab pt seen 08/28/20 [facility].

## 2020-10-24 ENCOUNTER — Other Ambulatory Visit: Payer: Self-pay | Admitting: Family Medicine

## 2020-11-19 ENCOUNTER — Telehealth: Payer: Self-pay | Admitting: Family Medicine

## 2020-11-19 DIAGNOSIS — Z8639 Personal history of other endocrine, nutritional and metabolic disease: Secondary | ICD-10-CM

## 2020-11-19 DIAGNOSIS — R7303 Prediabetes: Secondary | ICD-10-CM

## 2020-11-19 DIAGNOSIS — Z0000 Encounter for general adult medical examination without abnormal findings: Secondary | ICD-10-CM

## 2020-11-19 DIAGNOSIS — E785 Hyperlipidemia, unspecified: Secondary | ICD-10-CM

## 2020-11-19 NOTE — Telephone Encounter (Signed)
-----   Message from Cloyd Stagers, RT sent at 11/06/2020 11:28 AM EST ----- Regarding: Lab Orders for Tuesday 11.30.2021 Please place lab orders for Tuesday 11.30.2021, office visit for physical on Tuesday 12.7.2021 Thank you, Dyke Maes RT(R)

## 2020-11-20 ENCOUNTER — Ambulatory Visit (INDEPENDENT_AMBULATORY_CARE_PROVIDER_SITE_OTHER): Payer: Medicare Other

## 2020-11-20 ENCOUNTER — Other Ambulatory Visit (INDEPENDENT_AMBULATORY_CARE_PROVIDER_SITE_OTHER): Payer: Medicare Other

## 2020-11-20 ENCOUNTER — Other Ambulatory Visit: Payer: Self-pay

## 2020-11-20 DIAGNOSIS — Z0000 Encounter for general adult medical examination without abnormal findings: Secondary | ICD-10-CM

## 2020-11-20 DIAGNOSIS — R7303 Prediabetes: Secondary | ICD-10-CM

## 2020-11-20 DIAGNOSIS — Z8639 Personal history of other endocrine, nutritional and metabolic disease: Secondary | ICD-10-CM | POA: Diagnosis not present

## 2020-11-20 DIAGNOSIS — E785 Hyperlipidemia, unspecified: Secondary | ICD-10-CM | POA: Diagnosis not present

## 2020-11-20 LAB — COMPREHENSIVE METABOLIC PANEL
ALT: 16 U/L (ref 0–35)
AST: 19 U/L (ref 0–37)
Albumin: 4.1 g/dL (ref 3.5–5.2)
Alkaline Phosphatase: 75 U/L (ref 39–117)
BUN: 27 mg/dL — ABNORMAL HIGH (ref 6–23)
CO2: 32 mEq/L (ref 19–32)
Calcium: 9.4 mg/dL (ref 8.4–10.5)
Chloride: 104 mEq/L (ref 96–112)
Creatinine, Ser: 0.95 mg/dL (ref 0.40–1.20)
GFR: 58.8 mL/min — ABNORMAL LOW (ref >60.00)
Glucose, Bld: 92 mg/dL (ref 70–99)
Potassium: 3.9 mEq/L (ref 3.5–5.1)
Sodium: 142 mEq/L (ref 135–145)
Total Bilirubin: 0.5 mg/dL (ref 0.2–1.2)
Total Protein: 6.6 g/dL (ref 6.0–8.3)

## 2020-11-20 LAB — CBC WITH DIFFERENTIAL/PLATELET
Basophils Absolute: 0.1 10*3/uL (ref 0.0–0.1)
Basophils Relative: 0.6 % (ref 0.0–3.0)
Eosinophils Absolute: 0 10*3/uL (ref 0.0–0.7)
Eosinophils Relative: 0.4 % (ref 0.0–5.0)
HCT: 42.8 % (ref 36.0–46.0)
Hemoglobin: 14.1 g/dL (ref 12.0–15.0)
Lymphocytes Relative: 16.7 % (ref 12.0–46.0)
Lymphs Abs: 1.6 10*3/uL (ref 0.7–4.0)
MCHC: 32.9 g/dL (ref 30.0–36.0)
MCV: 89.4 fl (ref 78.0–100.0)
Monocytes Absolute: 1 10*3/uL (ref 0.1–1.0)
Monocytes Relative: 10.7 % (ref 3.0–12.0)
Neutro Abs: 6.8 10*3/uL (ref 1.4–7.7)
Neutrophils Relative %: 71.6 % (ref 43.0–77.0)
Platelets: 253 10*3/uL (ref 150.0–400.0)
RBC: 4.79 Mil/uL (ref 3.87–5.11)
RDW: 14.7 % (ref 11.5–15.5)
WBC: 9.5 10*3/uL (ref 4.0–10.5)

## 2020-11-20 LAB — LIPID PANEL
Cholesterol: 131 mg/dL (ref 0–200)
HDL: 48.7 mg/dL (ref >39.00)
LDL Cholesterol: 54 mg/dL (ref 0–99)
NonHDL: 81.91
Total CHOL/HDL Ratio: 3
Triglycerides: 140 mg/dL (ref 0.0–149.0)
VLDL: 28 mg/dL (ref 0.0–40.0)

## 2020-11-20 LAB — VITAMIN D 25 HYDROXY (VIT D DEFICIENCY, FRACTURES): VITD: 46.01 ng/mL (ref 30.00–100.00)

## 2020-11-20 LAB — TSH: TSH: 2.05 u[IU]/mL (ref 0.35–4.50)

## 2020-11-20 LAB — HEMOGLOBIN A1C: Hgb A1c MFr Bld: 5.9 % (ref 4.6–6.5)

## 2020-11-20 NOTE — Patient Instructions (Signed)
Tara Sloan , Thank you for taking time to come for your Medicare Wellness Visit. I appreciate your ongoing commitment to your health goals. Please review the following plan we discussed and let me know if I can assist you in the future.   Screening recommendations/referrals: Colonoscopy: Up to date, completed 09/12/2015, due 08/2025 Mammogram: due, Please call and schedule appointment Bone Density: due, Please call and schedule appointment Recommended yearly ophthalmology/optometry visit for glaucoma screening and checkup Recommended yearly dental visit for hygiene and checkup  Vaccinations: Influenza vaccine: Up to date, completed 11/08/2020, due 07/2021 Pneumococcal vaccine: Completed series Tdap vaccine: Up to date, completed 09/30/2018, due 09/2028 Shingles vaccine: due, check with your insurance regarding coverage if interested    Covid-19:Completed series  Advanced directives: Please bring a copy of your POA (Power of [place]) and/or Living Will to your next appointment.   Conditions/risks identified: hyperlipidemia  Next appointment: Follow up in one year for your annual wellness visit    Preventive Care 65 Years and Older, Female Preventive care refers to lifestyle choices and visits with your health care provider that can promote health and wellness. What does preventive care include?  A yearly physical exam. This is also called an annual well check.  Dental exams once or twice a year.  Routine eye exams. Ask your health care provider how often you should have your eyes checked.  Personal lifestyle choices, including:  Daily care of your teeth and gums.  Regular physical activity.  Eating a healthy diet.  Avoiding tobacco and drug use.  Limiting alcohol use.  Practicing safe sex.  Taking low-dose aspirin every day.  Taking vitamin and mineral supplements as recommended by your health care provider. What happens during an annual well check? The services and  screenings done by your health care provider during your annual well check will depend on your age, overall health, lifestyle risk factors, and family history of disease. Counseling  Your health care provider may ask you questions about your:  Alcohol use.  Tobacco use.  Drug use.  Emotional well-being.  Home and relationship well-being.  Sexual activity.  Eating habits.  History of falls.  Memory and ability to understand (cognition).  Work and work Statistician.  Reproductive health. Screening  You may have the following tests or measurements:  Height, weight, and BMI.  Blood pressure.  Lipid and cholesterol levels. These may be checked every 5 years, or more frequently if you are over 52 years old.  Skin check.  Lung cancer screening. You may have this screening every year starting at age 57 if you have a 30-pack-year history of smoking and currently smoke or have quit within the past 15 years.  Fecal occult blood test (FOBT) of the stool. You may have this test every year starting at age 32.  Flexible sigmoidoscopy or colonoscopy. You may have a sigmoidoscopy every 5 years or a colonoscopy every 10 years starting at age 32.  Hepatitis C blood test.  Hepatitis B blood test.  Sexually transmitted disease (STD) testing.  Diabetes screening. This is done by checking your blood sugar (glucose) after you have not eaten for a while (fasting). You may have this done every 1-3 years.  Bone density scan. This is done to screen for osteoporosis. You may have this done starting at age 16.  Mammogram. This may be done every 1-2 years. Talk to your health care provider about how often you should have regular mammograms. Talk with your health care provider about your  test results, treatment options, and if necessary, the need for more tests. Vaccines  Your health care provider may recommend certain vaccines, such as:  Influenza vaccine. This is recommended every  year.  Tetanus, diphtheria, and acellular pertussis (Tdap, Td) vaccine. You may need a Td booster every 10 years.  Zoster vaccine. You may need this after age 77.  Pneumococcal 13-valent conjugate (PCV13) vaccine. One dose is recommended after age 75.  Pneumococcal polysaccharide (PPSV23) vaccine. One dose is recommended after age 53. Talk to your health care provider about which screenings and vaccines you need and how often you need them. This information is not intended to replace advice given to you by your health care provider. Make sure you discuss any questions you have with your health care provider. Document Released: 01/04/2016 Document Revised: 08/27/2016 Document Reviewed: 10/09/2015 Elsevier Interactive Patient Education  2017 [place] Prevention in the Home Falls can cause injuries. They can happen to people of all ages. There are many things you can do to make your home safe and to help prevent falls. What can I do on the outside of my home?  Regularly fix the edges of walkways and driveways and fix any cracks.  Remove anything that might make you trip as you walk through a door, such as a raised step or threshold.  Trim any bushes or trees on the path to your home.  Use bright outdoor lighting.  Clear any walking paths of anything that might make someone trip, such as rocks or tools.  Regularly check to see if handrails are loose or broken. Make sure that both sides of any steps have handrails.  Any raised decks and porches should have guardrails on the edges.  Have any leaves, snow, or ice cleared regularly.  Use sand or salt on walking paths during winter.  Clean up any spills in your garage right away. This includes oil or grease spills. What can I do in the bathroom?  Use night lights.  Install grab bars by the toilet and in the tub and shower. Do not use towel bars as grab bars.  Use non-skid mats or decals in the tub or shower.  If you  need to sit down in the shower, use a plastic, non-slip stool.  Keep the floor dry. Clean up any water that spills on the floor as soon as it happens.  Remove soap buildup in the tub or shower regularly.  Attach bath mats securely with double-sided non-slip rug tape.  Do not have throw rugs and other things on the floor that can make you trip. What can I do in the bedroom?  Use night lights.  Make sure that you have a light by your bed that is easy to reach.  Do not use any sheets or blankets that are too big for your bed. They should not hang down onto the floor.  Have a firm chair that has side arms. You can use this for support while you get dressed.  Do not have throw rugs and other things on the floor that can make you trip. What can I do in the kitchen?  Clean up any spills right away.  Avoid walking on wet floors.  Keep items that you use a lot in easy-to-reach places.  If you need to reach something above you, use a strong step stool that has a grab bar.  Keep electrical cords out of the way.  Do not use floor polish or wax that  makes floors slippery. If you must use wax, use non-skid floor wax.  Do not have throw rugs and other things on the floor that can make you trip. What can I do with my stairs?  Do not leave any items on the stairs.  Make sure that there are handrails on both sides of the stairs and use them. Fix handrails that are broken or loose. Make sure that handrails are as long as the stairways.  Check any carpeting to make sure that it is firmly attached to the stairs. Fix any carpet that is loose or worn.  Avoid having throw rugs at the top or bottom of the stairs. If you do have throw rugs, attach them to the floor with carpet tape.  Make sure that you have a light switch at the top of the stairs and the bottom of the stairs. If you do not have them, ask someone to add them for you. What else can I do to help prevent falls?  Wear shoes  that:  Do not have high heels.  Have rubber bottoms.  Are comfortable and fit you well.  Are closed at the toe. Do not wear sandals.  If you use a stepladder:  Make sure that it is fully opened. Do not climb a closed stepladder.  Make sure that both sides of the stepladder are locked into place.  Ask someone to hold it for you, if possible.  Clearly mark and make sure that you can see:  Any grab bars or handrails.  First and last steps.  Where the edge of each step is.  Use tools that help you move around (mobility aids) if they are needed. These include:  Canes.  Walkers.  Scooters.  Crutches.  Turn on the lights when you go into a dark area. Replace any light bulbs as soon as they burn out.  Set up your furniture so you have a clear path. Avoid moving your furniture around.  If any of your floors are uneven, fix them.  If there are any pets around you, be aware of where they are.  Review your medicines with your doctor. Some medicines can make you feel dizzy. This can increase your chance of falling. Ask your doctor what other things that you can do to help prevent falls. This information is not intended to replace advice given to you by your health care provider. Make sure you discuss any questions you have with your health care provider. Document Released: 10/04/2009 Document Revised: 05/15/2016 Document Reviewed: 01/12/2015 Elsevier Interactive Patient Education  2017 [employer].

## 2020-11-20 NOTE — Progress Notes (Signed)
PCP notes:  Health Maintenance: Mammogram- due Dexa- due   Abnormal Screenings: none   Patient concerns: none   Nurse concerns: none   Next PCP appt.: 11/27/2020 @ 10:30 am

## 2020-11-20 NOTE — Progress Notes (Signed)
Subjective:   Tara Sloan is a 74 y.o. female who presents for Medicare Annual (Subsequent) preventive examination.  Review of Systems: N/A      I connected with the patient today by telephone and verified that I am speaking with the correct person using two identifiers. Location patient: home Location nurse: work Persons participating in the telephone visit: patient, nurse.   I discussed the limitations, risks, security and privacy concerns of performing an evaluation and management service by telephone and the availability of in person appointments. I also discussed with the patient that there may be a patient responsible charge related to this service. The patient expressed understanding and verbally consented to this telephonic visit.        Cardiac Risk Factors include: advanced age (>33men, >31 women);Other (see comment), Risk factor comments: hyperlipidemia     Objective:    Today's Vitals   There is no height or weight on file to calculate BMI.  Advanced Directives 11/20/2020 08/18/2018 05/28/2018 07/27/2017 07/15/2016 03/10/2016 08/29/2015  Does Patient Have a Medical Advance Directive? Yes Yes Yes Yes Yes Yes Yes  Type of Paramedic of [place];Living will [place];Living will Living will [place];Living will [place];Living will Healthcare Power of [place];Living will  Does patient want to make changes to medical advance directive? - - No - Patient declined - No - Patient declined No - Patient declined No - Patient declined  Copy of [place] in Chart? No - copy requested No - copy requested - No - copy requested - No - copy requested No - copy requested  Would patient like information on creating a medical advance directive? - - - - - - -    Current Medications (verified) Outpatient Encounter Medications as of 11/20/2020  Medication Sig    . aspirin EC 81 MG tablet Take 1 tablet (81 mg total) by mouth 2 (two) times daily. For DVT prophylaxis  . atorvastatin (LIPITOR) 80 MG tablet TAKE 1 TABLET BY MOUTH ONCE A DAY AT SIXIN THE EVENING  . busPIRone (BUSPAR) 15 MG tablet TAKE HALF TABLET (7.5 MG TOTAL) BY MOUTHTWICE DAILY  . Calcium Citrate-Vitamin D (CALCIUM + D PO) Take 1 tablet by mouth daily.  . Cholecalciferol (VITAMIN D-3) 1000 UNITS CAPS Take 1,000 Units by mouth daily.   Marland Kitchen donepezil (ARICEPT) 10 MG tablet Take 1 tablet (10 mg total) by mouth at bedtime.  . Multiple Vitamin (MULTIVITAMIN WITH MINERALS) TABS tablet Take 1 tablet by mouth every evening.  Marland Kitchen omeprazole (PRILOSEC) 20 MG capsule TAKE 1 CAPSULE BY MOUTH DAILY AS NEEDED FOR HEARTBURN  . [DISCONTINUED] prochlorperazine (COMPAZINE) 10 MG tablet Take 1 tablet (10 mg total) by mouth every 6 (six) hours as needed (Nausea or vomiting).   No facility-administered encounter medications on file as of 11/20/2020.    Allergies (verified) Pnu-imune [pneumococcal polysaccharide vaccine]   History: Past Medical History:  Diagnosis Date  . Allergic rhinitis   . Arthritis    "hands; probably qwhere" (03/12/2015)  . Basal cell carcinoma ~ 2010   "leg"  . Chronic back pain   . 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 External hemorrhoid   . GERD (gastroesophageal reflux disease)   . History of radiation therapy 03/22/12 - 04/09/12   right oropharynx/neck  . HLD (hyperlipidemia)   . LBBB (left bundle branch block) dx'd 05/2014  .  Non Hodgkin's lymphoma ([place])   . Osteoarthritis of right knee   . Osteopenia    Past Surgical History:  Procedure Laterality Date  . ABDOMINAL HYSTERECTOMY  1988   "partial; for fibroids"  . BASAL CELL CARCINOMA EXCISION Right ~ 2010   "leg"  . BREAST BIOPSY Left 12/03   Negative  . COLONOSCOPY  5/06   Ext. hemms  . DILATION AND CURETTAGE OF UTERUS  1987  . PARTIAL KNEE ARTHROPLASTY Right 06/08/2018    Procedure: UNICOMPARTMENTAL RIGHT KNEE;  Surgeon: Renette Butters, MD;  Location: [facility];  Service: Orthopedics;  Laterality: Right;  . REPLACEMENT UNICONDYLAR JOINT KNEE Right 06/08/2018  . TONSILLECTOMY  11/07/2011   Procedure: TONSILLECTOMY;  Surgeon: Rozetta Nunnery, MD;  [facility];  Service: ENT;  Laterality: N/A;  . TONSILLECTOMY  10/2011   "right was cancerous"  . TUBAL LIGATION  1987   Family History  Problem Relation Age of Onset  . Heart attack Father 51  . Heart disease Father   . Stroke Mother 73  . Breast cancer Paternal Aunt   . Cancer Paternal Aunt   . Cancer Paternal Aunt   . Hyperlipidemia Other        Family history  . Colon cancer Neg Hx   . Colon polyps Neg Hx    Social History   Socioeconomic History  . Marital status: Married    Spouse name: Not on file  . Number of children: 2  . Years of education: Not on file  . Highest education level: Not on file  Occupational History  . Occupation: Employed    Comment: [employer], Glass blower/designer  Tobacco Use  . Smoking status: Former Smoker    Packs/day: 0.25    Years: 10.00    Pack years: 2.50    Types: Cigarettes    Quit date: 11/04/1974    Years since quitting: 46.0  . Smokeless tobacco: Never Used  Vaping Use  . Vaping Use: Never used  Substance and Sexual Activity  . Alcohol use: Not Currently    Alcohol/week: 0.0 standard drinks    Comment: none  . Drug use: No  . Sexual activity: Never    Birth control/protection: None  Other Topics Concern  . Not on file  Social History Narrative   Married (husband with a lot of health problems and CAD)      2 children      Exercise-going to [employer]      Employed-plans to retire at 37   Social Determinants of Health   Financial Resource Strain: [place]   . Difficulty of Paying Living Expenses: Not hard at all  Food Insecurity: No Food Insecurity  . Worried About Charity fundraiser in the Last Year: Never true   . Ran Out of Food in the Last Year: Never true  Transportation Needs: No Transportation Needs  . Lack of Transportation (Medical): No  . Lack of Transportation (Non-Medical): No  Physical Activity: Sufficiently Active  . Days of Exercise per Week: 3 days  . Minutes of Exercise per Session: 60 min  Stress: No Stress Concern Present  . Feeling of Stress : Not at all  Social Connections:   . Frequency of Communication with Friends and Family: Not on file  . Frequency of Social Gatherings with Friends and Family: Not on file  . Attends Religious Services: Not on file  . Active Member of Clubs or Organizations: Not on file  .  Attends Archivist Meetings: Not on file  . Marital Status: Not on file    Tobacco Counseling Counseling given: Not Answered   Clinical Intake:  Pre-visit preparation completed: Yes  Pain : No/denies pain     Nutritional Risks: None Diabetes: No  How often do you need to have someone help you when you read instructions, pamphlets, or other written materials from your doctor or pharmacy?: 1 - Never What is the last grade level you completed in school?: 2 years of college  Diabetic: No Nutrition Risk Assessment:  Has the patient had any N/V/D within the last 2 months?  No  Does the patient have any non-healing wounds?  No  Has the patient had any unintentional weight loss or weight gain?  No   Diabetes:  Is the patient diabetic?  No  If diabetic, was a CBG obtained today?  N/A Did the patient bring in their glucometer from home?  N/A How often do you monitor your CBG's? N/A.   Financial Strains and Diabetes Management:  Are you having any financial strains with the device, your supplies or your medication? N/A.  Does the patient want to be seen by Chronic Care Management for management of their diabetes?  N/A Would the patient like to be referred to a Nutritionist or for Diabetic Management?  N/A   Interpreter Needed?:  No  Information entered by :: CJohnson, LPN   Activities of Daily Living In your present state of health, do you have any difficulty performing the following activities: 11/20/2020  Hearing? N  Vision? N  Difficulty concentrating or making decisions? N  Walking or climbing stairs? N  Dressing or bathing? N  Doing errands, shopping? N  Preparing Food and eating ? N  Using the Toilet? N  In the past six months, have you accidently leaked urine? N  Do you have problems with loss of bowel control? N  Managing your Medications? N  Managing your Finances? N  Housekeeping or managing your Housekeeping? N  Some recent data might be hidden    Patient Care Team: Tower, Wynelle Fanny, MD as PCP - General Rockey Situ, Kathlene November, MD as Consulting Physician (Cardiology) Lyla Glassing, MD as Referring Physician (Ophthalmology)  Indicate any recent Medical Services you may have received from other than [facility] providers in the past year (date may be approximate).     Assessment:   This is a routine wellness examination for Kyna.  Hearing/Vision screen  Hearing Screening   125Hz  250Hz  500Hz  1000Hz  2000Hz  3000Hz  4000Hz  6000Hz  8000Hz   Right ear:           Left ear:           Vision Screening Comments: Patient gets annual eye exams   Dietary issues and exercise activities discussed: Current Exercise Habits: Structured exercise class, Type of exercise: strength training/weights, Time (Minutes): 60, Frequency (Times/Week): 3, Weekly Exercise (Minutes/Week): 180, Intensity: Moderate, Exercise limited by: None identified  Goals    . Increase physical activity     When medically released, I will resume exercise for at least 60 minutes 3 days per week.     . Patient Stated     11/20/2020, I will continue to go to the [facility] and do strength training exercises 3 days a week for 1 hour.       Depression Screen PHQ 2/9 Scores 11/20/2020 08/28/2019 08/18/2018 07/27/2017 07/15/2016 06/18/2016 06/29/2015  PHQ - 2  Score 0 0 0 0 0 6 0  PHQ-  9 Score 0 - 0 - - 18 -    Fall Risk Fall Risk  11/20/2020 08/26/2019 08/18/2018 07/27/2017 07/15/2016  Falls in the past year? 0 0 No No No  Number falls in past yr: 0 0 - - -  Injury with Fall? 0 0 - - -  Risk for fall due to : No Fall Risks - - - -  Follow up Falls evaluation completed;Falls prevention discussed Falls evaluation completed - - -    Any stairs in or around the home? Yes  If so, are there any without handrails? No  Home free of loose throw rugs in walkways, pet beds, electrical cords, etc? Yes  Adequate lighting in your home to reduce risk of falls? Yes   ASSISTIVE DEVICES UTILIZED TO PREVENT FALLS:  Life alert? No  Use of a cane, walker or w/c? No  Grab bars in the bathroom? No  Shower chair or bench in shower? No  Elevated toilet seat or a handicapped toilet? No   TIMED UP AND GO:  Was the test performed? N/A, telephonic visit .   Cognitive Function: MMSE - Mini Mental State Exam 11/20/2020 08/18/2018 07/27/2017 07/15/2016  Orientation to time 5 5 5 5   Orientation to Place 5 5 5 5   Registration 3 3 3 3   Attention/ Calculation 5 0 0 0  Recall 3 1 3 3   Recall-comments - unable to recall 2 of 3 words - -  Language- name 2 objects - 0 0 0  Language- repeat 1 1 1 1   Language- follow 3 step command - 3 3 3   Language- read & follow direction - 0 0 0  Write a sentence - 0 0 0  Copy design - 0 0 0  Total score - 18 20 20         Immunizations Immunization History  Administered Date(s) Administered  . Fluad Quad(high Dose 65+) 08/26/2019, 11/08/2020  . Hepatitis B 04/08/2005, 05/06/2005, 10/08/2005  . Influenza Split 12/03/2011, 09/15/2012  . Influenza Whole 10/31/2002, 10/10/2008, 10/12/2009  . Influenza,inj,Quad PF,6+ Mos 10/10/2014, 10/30/2015, 10/15/2017, 09/30/2018  . Influenza-Unspecified 09/18/2013  . PFIZER SARS-COV-2 Vaccination 01/26/2020, 02/20/2020, 11/08/2020  . Pneumococcal Conjugate-13 06/27/2014  . Pneumococcal  Polysaccharide-23 12/12/2010  . Td 11/14/2002, 03/16/2013  . Tdap 09/30/2018  . Zoster 10/14/2012    TDAP status: Up to date Flu Vaccine status: Up to date Pneumococcal vaccine status: Up to date Covid-19 vaccine status: Completed vaccines  Qualifies for Shingles Vaccine? Yes   Zostavax completed Yes   Shingrix Completed?: No.    Education has been provided regarding the importance of this vaccine. Patient has been advised to call insurance company to determine out of pocket expense if they have not yet received this vaccine. Advised may also receive vaccine at local pharmacy or Health Dept. Verbalized acceptance and understanding.  Screening Tests Health Maintenance  Topic Date Due  . MAMMOGRAM  10/06/2020  . COLONOSCOPY  09/11/2025  . TETANUS/TDAP  09/30/2028  . INFLUENZA VACCINE  Completed  . DEXA SCAN  Completed  . COVID-19 Vaccine  Completed  . Hepatitis C Screening  Completed  . PNA vac Low Risk Adult  Completed    Health Maintenance  Health Maintenance Due  Topic Date Due  . MAMMOGRAM  10/06/2020    Colorectal cancer screening: Completed 09/12/2015. Repeat every 10 years Mammogram status: due, will call and schedule appointment   Bone Density status: due, will call and schedule appointment  Lung Cancer Screening: (Low Dose CT Chest  recommended if Age 33-80 years, 23 pack-year currently smoking OR have quit w/in 15 years.) does not qualify.   Additional Screening:  Hepatitis C Screening: does qualify; Completed 11/24/2011  Vision Screening: Recommended annual ophthalmology exams for early detection of glaucoma and other disorders of the eye. Is the patient up to date with their annual eye exam?  Yes  Who is the provider or what is the name of the office in which the patient attends annual eye exams? Dr. Charlann Boxer If pt is not established with a provider, would they like to be referred to a provider to establish care? No .   Dental Screening: Recommended annual  dental exams for proper oral hygiene  Community Resource Referral / Chronic Care Management: CRR required this visit?  No   CCM required this visit?  No      Plan:     I have personally reviewed and noted the following in the patient's chart:   . Medical and social history . Use of alcohol, tobacco or illicit drugs  . Current medications and supplements . Functional ability and status . Nutritional status . Physical activity . Advanced directives . List of other physicians . Hospitalizations, surgeries, and ER visits in previous 12 months . Vitals . Screenings to include cognitive, depression, and falls . Referrals and appointments  In addition, I have reviewed and discussed with patient certain preventive protocols, quality metrics, and best practice recommendations. A written personalized care plan for preventive services as well as general preventive health recommendations were provided to patient.   Due to this being a telephonic visit, the after visit summary with patients personalized plan was offered to patient via office or my-chart. Patient preferred to pick up at office at next visit or via mychart.   Andrez Grime, LPN   [id number]

## 2020-11-27 ENCOUNTER — Ambulatory Visit (INDEPENDENT_AMBULATORY_CARE_PROVIDER_SITE_OTHER): Payer: Medicare Other | Admitting: Family Medicine

## 2020-11-27 ENCOUNTER — Other Ambulatory Visit: Payer: Self-pay

## 2020-11-27 ENCOUNTER — Encounter: Payer: Self-pay | Admitting: Family Medicine

## 2020-11-27 VITALS — BP 142/78 | HR 63 | Temp 96.9°F | Ht 62.5 in | Wt 118.3 lb

## 2020-11-27 DIAGNOSIS — I700 Atherosclerosis of aorta: Secondary | ICD-10-CM | POA: Diagnosis not present

## 2020-11-27 DIAGNOSIS — R7303 Prediabetes: Secondary | ICD-10-CM

## 2020-11-27 DIAGNOSIS — Z8639 Personal history of other endocrine, nutritional and metabolic disease: Secondary | ICD-10-CM

## 2020-11-27 DIAGNOSIS — Z8572 Personal history of non-Hodgkin lymphomas: Secondary | ICD-10-CM

## 2020-11-27 DIAGNOSIS — Z0000 Encounter for general adult medical examination without abnormal findings: Secondary | ICD-10-CM

## 2020-11-27 DIAGNOSIS — F430 Acute stress reaction: Secondary | ICD-10-CM

## 2020-11-27 DIAGNOSIS — M85859 Other specified disorders of bone density and structure, unspecified thigh: Secondary | ICD-10-CM | POA: Diagnosis not present

## 2020-11-27 DIAGNOSIS — K219 Gastro-esophageal reflux disease without esophagitis: Secondary | ICD-10-CM

## 2020-11-27 DIAGNOSIS — E2839 Other primary ovarian failure: Secondary | ICD-10-CM

## 2020-11-27 MED ORDER — BUSPIRONE HCL 15 MG PO TABS: ORAL_TABLET | ORAL | 3 refills | Status: AC

## 2020-11-27 MED ORDER — ATORVASTATIN CALCIUM 80 MG PO TABS: ORAL_TABLET | ORAL | 3 refills | Status: AC

## 2020-11-27 MED ORDER — OMEPRAZOLE 20 MG PO CPDR: DELAYED_RELEASE_CAPSULE | ORAL | 3 refills | Status: AC

## 2020-11-27 MED ORDER — DONEPEZIL HCL 10 MG PO TABS: 10.0000 mg | ORAL_TABLET | Freq: Every day | ORAL | 3 refills | Status: AC

## 2020-11-27 NOTE — Assessment & Plan Note (Signed)
Cbc is stable  No problems  oncol signed off

## 2020-11-27 NOTE — Progress Notes (Signed)
Subjective:    Patient ID: Tara Sloan, female    DOB: [birth year], 74 y.o.   MRN: [id number]  This visit occurred during the SARS-CoV-2 public health emergency.  Safety protocols were in place, including screening questions prior to the visit, additional usage of staff PPE, and extensive cleaning of exam room while observing appropriate contact time as indicated for disinfecting solutions.    HPI Here for health maintenance exam and to review chronic medical problems    Wt Readings from Last 3 Encounters:  11/27/20 118 lb 5 oz (53.7 kg)  08/26/19 121 lb 4 oz (55 kg)  12/03/18 132 lb (59.9 kg)   21.29 kg/m   Is eating regularly and snacking Good appetite  Tries not to eat after supper if she can  Most of the wt loss happened with exercise    Stays busy Keeps grandchild 2 d per week- 104 years old  Keeps her active  Can't sit still in general    Has amw on 11/30 Noted mam/dexa due  No other care gaps   covid im with booster  zostavax 10/13-interested in shingrix  Flu shot 11/21  Mammogram 10/20-plans to schedule  Self breast exam -no lumps   Colonoscopy 9/16   dexa 9/19 -very mild osteopenia/due for another  Past had 5 y of fosamax  Falls-none Fractures-none Supplements ca and D  D level is 46 [facility] -[place]  [place] , wants to return and gain some muscle  No equip at the house   Cholesterol  Lab Results  Component Value Date   CHOL 131 11/20/2020   CHOL 132 08/23/2019   CHOL 131 08/18/2018   Lab Results  Component Value Date   HDL 48.70 11/20/2020   HDL 44.60 08/23/2019   HDL 37.20 (L) 08/18/2018   Lab Results  Component Value Date   LDLCALC 54 11/20/2020   LDLCALC 64 08/23/2019   LDLCALC 54 08/18/2018   Lab Results  Component Value Date   TRIG 140.0 11/20/2020   TRIG 121.0 08/23/2019   TRIG 198.0 (H) 08/18/2018   Lab Results  Component Value Date   CHOLHDL 3 11/20/2020   CHOLHDL 3 08/23/2019   CHOLHDL 4 08/18/2018   Lab  Results  Component Value Date   LDLDIRECT 92.2 03/02/2012  lipitor 80 mg daily Diet is good     Avoids fatty and fast food   Prediabetes Lab Results  Component Value Date   HGBA1C 5.9 11/20/2020  stable  Continues to watch  No sweets   H/o mild cog impairment -does not think it has changed  aricept 5 -inc to 10 mg  Tolerates well  Mood  Takes buspar  Doing ok /stress is lower   H/o B cell lymphoma Lab Results  Component Value Date   WBC 9.5 11/20/2020   HGB 14.1 11/20/2020   HCT 42.8 11/20/2020   MCV 89.4 11/20/2020   PLT 253.0 11/20/2020  oncology signed off  Blood count has been stable   Patient Active Problem List   Diagnosis Date Noted  . Pre-operative clearance 05/13/2018  . Knee osteoarthritis 05/03/2018  . Mild cognitive impairment 01/25/2018  . History of vitamin D deficiency 07/31/2017  . Stress reaction 08/04/2016  . Estrogen deficiency 07/04/2016  . Aortic atherosclerosis ([place]) 07/06/2015  . Colon cancer screening 06/29/2015  . Routine general medical examination at a health care facility 06/21/2015  . Chest pain 03/11/2015  . Left bundle branch block (LBBB) 07/06/2014  .  Encounter for Medicare annual wellness exam 03/17/2013  . 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 Pain in joint, ankle and foot 03/16/2013  . Prediabetes 12/01/2011  . History of B-cell lymphoma 11/22/2011  . ARTHRITIS, HANDS, BILATERAL 12/12/2010  . Dyslipidemia 04/02/2008  . ALLERGIC RHINITIS 04/02/2008  . GERD 04/02/2008  . Osteopenia 09/13/2007  . ALLERGY 08/06/2007   Past Medical History:  Diagnosis Date  . Allergic rhinitis   . Arthritis    "hands; probably qwhere" (03/12/2015)  . Basal cell carcinoma ~ 2010   "leg"  . Chronic back pain   . 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 External hemorrhoid   . GERD (gastroesophageal reflux disease)   . History of radiation therapy 03/22/12 - 04/09/12   right oropharynx/neck  . HLD  (hyperlipidemia)   . LBBB (left bundle branch block) dx'd 05/2014  . Non Hodgkin's lymphoma ([place])   . Osteoarthritis of right knee   . Osteopenia    Past Surgical History:  Procedure Laterality Date  . ABDOMINAL HYSTERECTOMY  1988   "partial; for fibroids"  . BASAL CELL CARCINOMA EXCISION Right ~ 2010   "leg"  . BREAST BIOPSY Left 12/03   Negative  . COLONOSCOPY  5/06   Ext. hemms  . DILATION AND CURETTAGE OF UTERUS  1987  . PARTIAL KNEE ARTHROPLASTY Right 06/08/2018   Procedure: UNICOMPARTMENTAL RIGHT KNEE;  Surgeon: Renette Butters, MD;  Location: [facility];  Service: Orthopedics;  Laterality: Right;  . REPLACEMENT UNICONDYLAR JOINT KNEE Right 06/08/2018  . TONSILLECTOMY  11/07/2011   Procedure: TONSILLECTOMY;  Surgeon: Rozetta Nunnery, MD;  Location: [facility];  Service: ENT;  Laterality: N/A;  . TONSILLECTOMY  10/2011   "right was cancerous"  . TUBAL LIGATION  1987   Social History   Tobacco Use  . Smoking status: Former Smoker    Packs/day: 0.25    Years: 10.00    Pack years: 2.50    Types: Cigarettes    Quit date: 11/04/1974    Years since quitting: 46.0  . Smokeless tobacco: Never Used  Vaping Use  . Vaping Use: Never used  Substance Use Topics  . Alcohol use: Not Currently    Alcohol/week: 0.0 standard drinks    Comment: none  . Drug use: No   Family History  Problem Relation Age of Onset  . Heart attack Father 45  . Heart disease Father   . Stroke Mother 13  . Breast cancer Paternal Aunt   . Cancer Paternal Aunt   . Cancer Paternal Aunt   . Hyperlipidemia Other        Family history  . Colon cancer Neg Hx   . Colon polyps Neg Hx    Allergies  Allergen Reactions  . Pnu-Imune [Pneumococcal Polysaccharide Vaccine] Hives   Current Outpatient Medications on File Prior to Visit  Medication Sig Dispense Refill  . aspirin EC 81 MG tablet Take 1 tablet (81 mg total) by mouth 2 (two) times daily. For DVT prophylaxis 60 tablet 0  .  Calcium Citrate-Vitamin D (CALCIUM + D PO) Take 1 tablet by mouth daily.    . Cholecalciferol (VITAMIN D-3) 1000 UNITS CAPS Take 1,000 Units by mouth daily.     . Multiple Vitamin (MULTIVITAMIN WITH MINERALS) TABS tablet Take 1 tablet by mouth every evening.     No current facility-administered medications on file prior to visit.    Review of Systems  Constitutional: Negative  for activity change, appetite change, fatigue, fever and unexpected weight change.  HENT: Negative for congestion, ear pain, rhinorrhea, sinus pressure and sore throat.   Eyes: Negative for pain, redness and visual disturbance.  Respiratory: Negative for cough, shortness of breath and wheezing.   Cardiovascular: Negative for chest pain and palpitations.  Gastrointestinal: Negative for abdominal pain, blood in stool, constipation and diarrhea.  Endocrine: Negative for polydipsia and polyuria.  Genitourinary: Negative for dysuria, frequency and urgency.  Musculoskeletal: Negative for arthralgias, back pain and myalgias.  Skin: Negative for pallor and rash.  Allergic/Immunologic: Negative for environmental allergies.  Neurological: Negative for dizziness, syncope and headaches.  Hematological: Negative for adenopathy. Does not bruise/bleed easily.  Psychiatric/Behavioral: Negative for decreased concentration and dysphoric mood. The patient is not nervous/anxious.        MCI -can be forgetful       Objective:   Physical Exam Constitutional:      General: She is not in acute distress.    Appearance: Normal appearance. She is well-developed and normal weight. She is not ill-appearing or diaphoretic.  HENT:     Head: Normocephalic and atraumatic.     Right Ear: Tympanic membrane, ear canal and external ear normal.     Left Ear: Tympanic membrane, ear canal and external ear normal.     Nose: Nose normal. No congestion.     Mouth/Throat:     Mouth: Mucous membranes are moist.     Pharynx: Oropharynx is clear. No  posterior oropharyngeal erythema.  Eyes:     General: No scleral icterus.    Extraocular Movements: Extraocular movements intact.     Conjunctiva/sclera: Conjunctivae normal.     Pupils: Pupils are equal, round, and reactive to light.  Neck:     Thyroid: No thyromegaly.     Vascular: No carotid bruit or JVD.  Cardiovascular:     Rate and Rhythm: Normal rate and regular rhythm.     Pulses: Normal pulses.     Heart sounds: Normal heart sounds. No gallop.   Pulmonary:     Effort: Pulmonary effort is normal. No respiratory distress.     Breath sounds: Normal breath sounds. No wheezing.     Comments: Good air exch Chest:     Chest wall: No tenderness.  Abdominal:     General: Bowel sounds are normal. There is no distension or abdominal bruit.     Palpations: Abdomen is soft. There is no mass.     Tenderness: There is no abdominal tenderness.     Hernia: No hernia is present.  Genitourinary:    Comments: Breast exam: No mass, nodules, thickening, tenderness, bulging, retraction, inflamation, nipple discharge or skin changes noted.  No axillary or clavicular LA.     Musculoskeletal:        General: No tenderness. Normal range of motion.     Cervical back: Normal range of motion and neck supple. No rigidity. No muscular tenderness.     Right lower leg: No edema.     Left lower leg: No edema.     Comments: Mild kyphosis   Lymphadenopathy:     Cervical: No cervical adenopathy.  Skin:    General: Skin is warm and dry.     Coloration: Skin is not pale.     Findings: No erythema or rash.     Comments: Solar lentigines diffusely Many SKs on trunk  Neurological:     Mental Status: She is alert. Mental status is at baseline.  Cranial Nerves: No cranial nerve deficit.     Motor: No abnormal muscle tone.     Coordination: Coordination normal.     Gait: Gait normal.     Deep Tendon Reflexes: Reflexes are normal and symmetric. Reflexes normal.  Psychiatric:        Mood and Affect: Mood  normal.        Cognition and Memory: Cognition normal.     Comments: Fairly sharp today  Does repeat herself a few times             Assessment & Plan:   Problem List Items Addressed This Visit      Cardiovascular and Mediastinum   Aortic atherosclerosis ([place])    Monitored by cardiology  On high dose statin with good cholesterol control  No symptoms       Relevant Medications   atorvastatin (LIPITOR) 80 MG tablet     Digestive   GERD    Controlled with omeprazole      Relevant Medications   omeprazole (PRILOSEC) 20 MG capsule     Musculoskeletal and Integument   Osteopenia    dexa ordered  Past 5 y of fosamax  Compliant with ca and D Could use more exercise   Disc need for calcium/ vitamin D/ wt bearing exercise and bone density test every 2 y to monitor Disc safety/ fracture risk in detail          Other   History of B-cell lymphoma    Cbc is stable  No problems  oncol signed off       Prediabetes    Lab Results  Component Value Date   HGBA1C 5.9 11/20/2020   Stable  disc imp of low glycemic diet and wt loss to prevent DM2  Has lost weight (pt would like to go back to gym to retain muscle)       Routine general medical examination at a health care facility - Primary    Reviewed health habits including diet and exercise and skin cancer prevention Reviewed appropriate screening tests for age  Also reviewed health mt list, fam hx and immunization status , as well as social and family history   See HPI Labs rev  amw rev  Given # to call for mammogram and dexa scheduling (order is in)  imms are up to date  Interested in shingrix vaccine if covered  Disc options for resistance exercise  No falls or fractures  Cognitive status is stable per pt       Estrogen deficiency   Relevant Orders   DG Bone Density   Stress reaction    Still doing fairly well (stress is lower)  Takes buspar  Takes care of 2 yo grandchild which is a joy and keeps her  active        Relevant Medications   busPIRone (BUSPAR) 15 MG tablet   History of vitamin D deficiency    Level of 46 Vitamin D level is therapeutic with current supplementation Disc importance of this to bone and overall health

## 2020-11-27 NOTE — Assessment & Plan Note (Signed)
Controlled with omeprazole 

## 2020-11-27 NOTE — Patient Instructions (Addendum)
[facility]  Also your bone density test -[place] on [address]  Try lifting some weights or using exercise bands at home   If you are interested in the new shingles vaccine (Shingrix) - call your local pharmacy to check on coverage and availability  If affordable, get on a wait list at your pharmacy to get the vaccine.  Take care of yourself

## 2020-11-27 NOTE — Assessment & Plan Note (Signed)
dexa ordered  Past 5 y of fosamax  Compliant with ca and D Could use more exercise   Disc need for calcium/ vitamin D/ wt bearing exercise and bone density test every 2 y to monitor Disc safety/ fracture risk in detail

## 2020-11-27 NOTE — Assessment & Plan Note (Signed)
Level of 46 Vitamin D level is therapeutic with current supplementation Disc importance of this to bone and overall health

## 2020-11-27 NOTE — Assessment & Plan Note (Signed)
Still doing fairly well (stress is lower)  Takes buspar  Takes care of 75 yo grandchild which is a joy and keeps her active

## 2020-11-27 NOTE — Assessment & Plan Note (Signed)
Reviewed health habits including diet and exercise and skin cancer prevention Reviewed appropriate screening tests for age  Also reviewed health mt list, fam hx and immunization status , as well as social and family history   See HPI Labs rev  amw rev  Given # to call for mammogram and dexa scheduling (order is in)  imms are up to date  Interested in shingrix vaccine if covered  Disc options for resistance exercise  No falls or fractures  Cognitive status is stable per pt

## 2020-11-27 NOTE — Assessment & Plan Note (Signed)
Monitored by cardiology  On high dose statin with good cholesterol control  No symptoms

## 2020-11-27 NOTE — Assessment & Plan Note (Signed)
Lab Results  Component Value Date   HGBA1C 5.9 11/20/2020   Stable  disc imp of low glycemic diet and wt loss to prevent DM2  Has lost weight (pt would like to go back to gym to retain muscle)

## 2020-12-28 ENCOUNTER — Inpatient Hospital Stay (HOSPITAL_COMMUNITY)
Admission: EM | Admit: 2020-12-28 | Discharge: 2021-01-22 | DRG: 871 | Disposition: E | Payer: Medicare Other | Attending: Pulmonary Disease | Admitting: Pulmonary Disease

## 2020-12-28 ENCOUNTER — Emergency Department (HOSPITAL_COMMUNITY): Payer: Medicare Other

## 2020-12-28 ENCOUNTER — Other Ambulatory Visit: Payer: Self-pay

## 2020-12-28 ENCOUNTER — Encounter (HOSPITAL_COMMUNITY): Payer: Self-pay

## 2020-12-28 DIAGNOSIS — Z66 Do not resuscitate: Secondary | ICD-10-CM | POA: Diagnosis not present

## 2020-12-28 DIAGNOSIS — J9600 Acute respiratory failure, unspecified whether with hypoxia or hypercapnia: Secondary | ICD-10-CM

## 2020-12-28 DIAGNOSIS — R9431 Abnormal electrocardiogram [ECG] [EKG]: Secondary | ICD-10-CM | POA: Diagnosis present

## 2020-12-28 DIAGNOSIS — J1282 Pneumonia due to coronavirus disease 2019: Secondary | ICD-10-CM | POA: Diagnosis present

## 2020-12-28 DIAGNOSIS — G9340 Encephalopathy, unspecified: Secondary | ICD-10-CM | POA: Diagnosis not present

## 2020-12-28 DIAGNOSIS — G40909 Epilepsy, unspecified, not intractable, without status epilepticus: Secondary | ICD-10-CM | POA: Diagnosis present

## 2020-12-28 DIAGNOSIS — J690 Pneumonitis due to inhalation of food and vomit: Secondary | ICD-10-CM | POA: Diagnosis present

## 2020-12-28 DIAGNOSIS — R911 Solitary pulmonary nodule: Secondary | ICD-10-CM | POA: Diagnosis not present

## 2020-12-28 DIAGNOSIS — I771 Stricture of artery: Secondary | ICD-10-CM | POA: Diagnosis not present

## 2020-12-28 DIAGNOSIS — R34 Anuria and oliguria: Secondary | ICD-10-CM | POA: Diagnosis not present

## 2020-12-28 DIAGNOSIS — R509 Fever, unspecified: Secondary | ICD-10-CM | POA: Diagnosis not present

## 2020-12-28 DIAGNOSIS — R4701 Aphasia: Secondary | ICD-10-CM | POA: Diagnosis not present

## 2020-12-28 DIAGNOSIS — K219 Gastro-esophageal reflux disease without esophagitis: Secondary | ICD-10-CM | POA: Diagnosis present

## 2020-12-28 DIAGNOSIS — F0390 Unspecified dementia without behavioral disturbance: Secondary | ICD-10-CM | POA: Diagnosis present

## 2020-12-28 DIAGNOSIS — I5022 Chronic systolic (congestive) heart failure: Secondary | ICD-10-CM | POA: Diagnosis not present

## 2020-12-28 DIAGNOSIS — Z8249 Family history of ischemic heart disease and other diseases of the circulatory system: Secondary | ICD-10-CM

## 2020-12-28 DIAGNOSIS — C8330 Diffuse large B-cell lymphoma, unspecified site: Secondary | ICD-10-CM | POA: Diagnosis present

## 2020-12-28 DIAGNOSIS — Z823 Family history of stroke: Secondary | ICD-10-CM

## 2020-12-28 DIAGNOSIS — J9811 Atelectasis: Secondary | ICD-10-CM | POA: Diagnosis not present

## 2020-12-28 DIAGNOSIS — J3489 Other specified disorders of nose and nasal sinuses: Secondary | ICD-10-CM | POA: Diagnosis not present

## 2020-12-28 DIAGNOSIS — G09 Sequelae of inflammatory diseases of central nervous system: Secondary | ICD-10-CM | POA: Diagnosis present

## 2020-12-28 DIAGNOSIS — Z87891 Personal history of nicotine dependence: Secondary | ICD-10-CM

## 2020-12-28 DIAGNOSIS — B004 Herpesviral encephalitis: Secondary | ICD-10-CM | POA: Diagnosis not present

## 2020-12-28 DIAGNOSIS — Z923 Personal history of irradiation: Secondary | ICD-10-CM | POA: Diagnosis not present

## 2020-12-28 DIAGNOSIS — E162 Hypoglycemia, unspecified: Secondary | ICD-10-CM | POA: Diagnosis not present

## 2020-12-28 DIAGNOSIS — Z515 Encounter for palliative care: Secondary | ICD-10-CM | POA: Diagnosis not present

## 2020-12-28 DIAGNOSIS — J9690 Respiratory failure, unspecified, unspecified whether with hypoxia or hypercapnia: Secondary | ICD-10-CM | POA: Diagnosis not present

## 2020-12-28 DIAGNOSIS — J9601 Acute respiratory failure with hypoxia: Secondary | ICD-10-CM | POA: Diagnosis not present

## 2020-12-28 DIAGNOSIS — R400 Somnolence: Secondary | ICD-10-CM | POA: Diagnosis present

## 2020-12-28 DIAGNOSIS — M1711 Unilateral primary osteoarthritis, right knee: Secondary | ICD-10-CM | POA: Diagnosis present

## 2020-12-28 DIAGNOSIS — G0490 Encephalitis and encephalomyelitis, unspecified: Secondary | ICD-10-CM | POA: Diagnosis not present

## 2020-12-28 DIAGNOSIS — F32A Depression, unspecified: Secondary | ICD-10-CM | POA: Diagnosis present

## 2020-12-28 DIAGNOSIS — G8191 Hemiplegia, unspecified affecting right dominant side: Secondary | ICD-10-CM | POA: Diagnosis present

## 2020-12-28 DIAGNOSIS — E041 Nontoxic single thyroid nodule: Secondary | ICD-10-CM | POA: Diagnosis not present

## 2020-12-28 DIAGNOSIS — A419 Sepsis, unspecified organism: Secondary | ICD-10-CM | POA: Diagnosis not present

## 2020-12-28 DIAGNOSIS — U071 COVID-19: Secondary | ICD-10-CM | POA: Diagnosis present

## 2020-12-28 DIAGNOSIS — I708 Atherosclerosis of other arteries: Secondary | ICD-10-CM | POA: Diagnosis not present

## 2020-12-28 DIAGNOSIS — Z85828 Personal history of other malignant neoplasm of skin: Secondary | ICD-10-CM

## 2020-12-28 DIAGNOSIS — R7303 Prediabetes: Secondary | ICD-10-CM | POA: Diagnosis present

## 2020-12-28 DIAGNOSIS — G936 Cerebral edema: Secondary | ICD-10-CM | POA: Diagnosis not present

## 2020-12-28 DIAGNOSIS — R0602 Shortness of breath: Secondary | ICD-10-CM

## 2020-12-28 DIAGNOSIS — R410 Disorientation, unspecified: Secondary | ICD-10-CM

## 2020-12-28 DIAGNOSIS — R079 Chest pain, unspecified: Secondary | ICD-10-CM | POA: Diagnosis not present

## 2020-12-28 DIAGNOSIS — G8929 Other chronic pain: Secondary | ICD-10-CM | POA: Diagnosis present

## 2020-12-28 DIAGNOSIS — T380X5A Adverse effect of glucocorticoids and synthetic analogues, initial encounter: Secondary | ICD-10-CM | POA: Diagnosis not present

## 2020-12-28 DIAGNOSIS — R739 Hyperglycemia, unspecified: Secondary | ICD-10-CM | POA: Diagnosis not present

## 2020-12-28 DIAGNOSIS — I82A11 Acute embolism and thrombosis of right axillary vein: Secondary | ICD-10-CM | POA: Diagnosis not present

## 2020-12-28 DIAGNOSIS — G9389 Other specified disorders of brain: Secondary | ICD-10-CM | POA: Diagnosis not present

## 2020-12-28 DIAGNOSIS — E785 Hyperlipidemia, unspecified: Secondary | ICD-10-CM | POA: Diagnosis present

## 2020-12-28 DIAGNOSIS — R4182 Altered mental status, unspecified: Secondary | ICD-10-CM

## 2020-12-28 DIAGNOSIS — E876 Hypokalemia: Secondary | ICD-10-CM | POA: Diagnosis not present

## 2020-12-28 DIAGNOSIS — Z887 Allergy status to serum and vaccine status: Secondary | ICD-10-CM

## 2020-12-28 DIAGNOSIS — M8580 Other specified disorders of bone density and structure, unspecified site: Secondary | ICD-10-CM | POA: Diagnosis present

## 2020-12-28 DIAGNOSIS — I6523 Occlusion and stenosis of bilateral carotid arteries: Secondary | ICD-10-CM | POA: Diagnosis not present

## 2020-12-28 DIAGNOSIS — I517 Cardiomegaly: Secondary | ICD-10-CM | POA: Diagnosis not present

## 2020-12-28 DIAGNOSIS — J90 Pleural effusion, not elsewhere classified: Secondary | ICD-10-CM | POA: Diagnosis not present

## 2020-12-28 DIAGNOSIS — R0902 Hypoxemia: Secondary | ICD-10-CM

## 2020-12-28 DIAGNOSIS — Z803 Family history of malignant neoplasm of breast: Secondary | ICD-10-CM

## 2020-12-28 DIAGNOSIS — R000 Tachycardia, unspecified: Secondary | ICD-10-CM | POA: Diagnosis present

## 2020-12-28 DIAGNOSIS — G9349 Other encephalopathy: Secondary | ICD-10-CM | POA: Diagnosis present

## 2020-12-28 DIAGNOSIS — M549 Dorsalgia, unspecified: Secondary | ICD-10-CM | POA: Diagnosis present

## 2020-12-28 DIAGNOSIS — Z96651 Presence of right artificial knee joint: Secondary | ICD-10-CM | POA: Diagnosis present

## 2020-12-28 DIAGNOSIS — M7989 Other specified soft tissue disorders: Secondary | ICD-10-CM | POA: Diagnosis not present

## 2020-12-28 DIAGNOSIS — Z7982 Long term (current) use of aspirin: Secondary | ICD-10-CM

## 2020-12-28 DIAGNOSIS — R29818 Other symptoms and signs involving the nervous system: Secondary | ICD-10-CM | POA: Diagnosis not present

## 2020-12-28 DIAGNOSIS — I639 Cerebral infarction, unspecified: Secondary | ICD-10-CM | POA: Diagnosis not present

## 2020-12-28 LAB — COMPREHENSIVE METABOLIC PANEL
ALT: 21 U/L (ref 0–44)
AST: 30 U/L (ref 15–41)
Albumin: 3.7 g/dL (ref 3.5–5.0)
Alkaline Phosphatase: 50 U/L (ref 38–126)
Anion gap: 12 (ref 5–15)
BUN: 38 mg/dL — ABNORMAL HIGH (ref 8–23)
CO2: 24 mmol/L (ref 22–32)
Calcium: 8.7 mg/dL — ABNORMAL LOW (ref 8.9–10.3)
Chloride: 104 mmol/L (ref 98–111)
Creatinine, Ser: 0.87 mg/dL (ref 0.44–1.00)
GFR, Estimated: >60 mL/min (ref >60)
Glucose, Bld: 140 mg/dL — ABNORMAL HIGH (ref 70–99)
Potassium: 3.5 mmol/L (ref 3.5–5.1)
Sodium: 140 mmol/L (ref 135–145)
Total Bilirubin: 0.8 mg/dL (ref 0.3–1.2)
Total Protein: 7 g/dL (ref 6.5–8.1)

## 2020-12-28 LAB — DIFFERENTIAL
Abs Immature Granulocytes: 0.11 10*3/uL — ABNORMAL HIGH (ref 0.00–0.07)
Basophils Absolute: 0 10*3/uL (ref 0.0–0.1)
Basophils Relative: 0 %
Eosinophils Absolute: 0 10*3/uL (ref 0.0–0.5)
Eosinophils Relative: 0 %
Immature Granulocytes: 1 %
Lymphocytes Relative: 8 %
Lymphs Abs: 1.2 10*3/uL (ref 0.7–4.0)
Monocytes Absolute: 1.2 10*3/uL — ABNORMAL HIGH (ref 0.1–1.0)
Monocytes Relative: 8 %
Neutro Abs: 12.6 10*3/uL — ABNORMAL HIGH (ref 1.7–7.7)
Neutrophils Relative %: 83 %

## 2020-12-28 LAB — CBC
HCT: 44.1 % (ref 36.0–46.0)
Hemoglobin: 14.2 g/dL (ref 12.0–15.0)
MCH: 29.6 pg (ref 26.0–34.0)
MCHC: 32.2 g/dL (ref 30.0–36.0)
MCV: 92.1 fL (ref 80.0–100.0)
Platelets: 225 10*3/uL (ref 150–400)
RBC: 4.79 MIL/uL (ref 3.87–5.11)
RDW: 14 % (ref 11.5–15.5)
WBC: 15.1 10*3/uL — ABNORMAL HIGH (ref 4.0–10.5)
nRBC: 0 % (ref 0.0–0.2)

## 2020-12-28 LAB — I-STAT CHEM 8, ED
BUN: 39 mg/dL — ABNORMAL HIGH (ref 8–23)
Calcium, Ion: 1.07 mmol/L — ABNORMAL LOW (ref 1.15–1.40)
Chloride: 104 mmol/L (ref 98–111)
Creatinine, Ser: 0.8 mg/dL (ref 0.44–1.00)
Glucose, Bld: 135 mg/dL — ABNORMAL HIGH (ref 70–99)
HCT: 42 % (ref 36.0–46.0)
Hemoglobin: 14.3 g/dL (ref 12.0–15.0)
Potassium: 3.4 mmol/L — ABNORMAL LOW (ref 3.5–5.1)
Sodium: 143 mmol/L (ref 135–145)
TCO2: 25 mmol/L (ref 22–32)

## 2020-12-28 LAB — PROTIME-INR
INR: 1.1 (ref 0.8–1.2)
Prothrombin Time: 13.5 seconds (ref 11.4–15.2)

## 2020-12-28 LAB — APTT: aPTT: 25 seconds (ref 24–36)

## 2020-12-28 MED ORDER — DEXTROSE 5 % IV SOLN
10.0000 mg/kg | Freq: Two times a day (BID) | INTRAVENOUS | Status: DC
  Administered 2020-12-29 – 2020-12-31 (×6): 535 mg via INTRAVENOUS
  Filled 2020-12-28 (×11): qty 10.7

## 2020-12-28 MED ORDER — IOHEXOL 350 MG/ML SOLN
100.0000 mL | Freq: Once | INTRAVENOUS | Status: AC | PRN
  Administered 2020-12-28: 100 mL via INTRAVENOUS

## 2020-12-28 MED ORDER — SODIUM CHLORIDE 0.9 % IV SOLN
2.0000 g | Freq: Once | INTRAVENOUS | Status: AC
  Administered 2020-12-29: 2 g via INTRAVENOUS
  Filled 2020-12-28: qty 20

## 2020-12-28 MED ORDER — SODIUM CHLORIDE 0.9 % IV SOLN: 2.0000 g | Freq: Once | INTRAVENOUS | Status: DC

## 2020-12-28 MED ORDER — SODIUM CHLORIDE 0.9 % IV BOLUS
500.0000 mL | Freq: Once | INTRAVENOUS | Status: AC
  Administered 2020-12-29: 500 mL via INTRAVENOUS

## 2020-12-28 MED ORDER — LORAZEPAM 2 MG/ML IJ SOLN
1.0000 mg | Freq: Once | INTRAMUSCULAR | Status: AC
  Administered 2020-12-29: 1 mg via INTRAVENOUS
  Filled 2020-12-28: qty 1

## 2020-12-28 MED ORDER — SODIUM CHLORIDE 0.9 % IV SOLN: 1.0000 g | Freq: Once | INTRAVENOUS | Status: DC

## 2020-12-28 MED ORDER — ACETAMINOPHEN 650 MG RE SUPP
650.0000 mg | Freq: Once | RECTAL | Status: AC
  Administered 2020-12-29: 650 mg via RECTAL
  Filled 2020-12-28: qty 1

## 2020-12-28 NOTE — ED Provider Notes (Incomplete)
11:59 PM Assumed care from Dr. Ronnald Nian, please see their note for full history, physical and decision making until this point. In brief this is a 75 y.o. 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 Pending MRI and likely subsequent LP and admission.   Discharge instructions, including strict return precautions for new or worsening symptoms, given. Patient and/or family verbalized understanding and agreement with the plan as described.   Labs, studies and imaging reviewed by myself and considered in medical decision making if ordered. Imaging interpreted by radiology.  Labs Reviewed  CBC - Abnormal; Notable for the following components:      Result Value   WBC 15.1 (*)    All other components within normal limits  DIFFERENTIAL - Abnormal; Notable for the following components:   Neutro Abs 12.6 (*)    Monocytes Absolute 1.2 (*)    Abs Immature Granulocytes 0.11 (*)    All other components within normal limits  COMPREHENSIVE METABOLIC PANEL - Abnormal; Notable for the following components:   Glucose, Bld 140 (*)    BUN 38 (*)    Calcium 8.7 (*)    All other components within normal limits  I-STAT CHEM 8, ED - Abnormal; Notable for the following components:   Potassium 3.4 (*)    BUN 39 (*)    Glucose, Bld 135 (*)    Calcium, Ion 1.07 (*)    All other components within normal limits  RESP PANEL BY RT-PCR (FLU A&B, COVID) ARPGX2  URINE CULTURE  CULTURE, BLOOD (ROUTINE X 2)  CULTURE, BLOOD (ROUTINE X 2)  CSF CULTURE  GRAM STAIN  PROTIME-INR  APTT  RAPID URINE DRUG SCREEN, HOSP PERFORMED  URINALYSIS, ROUTINE W REFLEX MICROSCOPIC  LACTIC ACID, PLASMA  LACTIC ACID, PLASMA  HSV 1/2 PCR, CSF  CSF CELL COUNT WITH DIFFERENTIAL  CSF CELL COUNT WITH DIFFERENTIAL  PROTEIN AND GLUCOSE, CSF  HIV ANTIBODY (ROUTINE TESTING W REFLEX)    DG Chest Portable 1 View  Final Result    CT Head Wo Contrast   Final Result    MR BRAIN WO CONTRAST    (Results Pending)  CT CEREBRAL PERFUSION W CONTRAST    (Results Pending)  CT Angio Head W or Wo Contrast    (Results Pending)  CT Angio Neck W and/or Wo Contrast    (Results Pending)    No follow-ups on file.

## 2020-12-28 NOTE — Progress Notes (Signed)
Pharmacy Antibiotic Note  Tara Sloan is a 75 y.o. 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  Pharmacy has been consulted for Acyclovir dosing.  Plan: Acyclovir 535 mg (10mg /kg) q12h Will f/u renal function, micro data, and pt's clinical condition     Temp (24hrs), Avg:101.2 F (38.4 C), Min:100.1 F (37.8 C), Max:102.2 F (39 C)  Recent Labs  Lab 01/03/2021 2214 12/22/2020 2219  WBC  --  15.1*  CREATININE 0.80 0.87    CrCl cannot be calculated (Unknown ideal weight.).    Allergies  Allergen Reactions  . Pnu-Imune [Pneumococcal Polysaccharide Vaccine] Hives    Antimicrobials this admission: 1/8 Acyclovir >>  1/7 Ceftriaxone x1  Microbiology results:  BCx:   UCx:    CSF:   Thank you for allowing pharmacy to be a part of this patient's care.  Sherlon Handing, PharmD, BCPS Please see amion for complete clinical pharmacist phone list 01/03/2021 11:34 PM

## 2020-12-28 NOTE — ED Notes (Signed)
RN made aware of temp 

## 2020-12-28 NOTE — ED Provider Notes (Signed)
[facility] EMERGENCY DEPARTMENT Provider Note   CSN: [id number] Arrival date & time: 12-31-2020  2007     History Chief Complaint  Patient presents with  . Aphasia  . Stroke Symptoms    Tara Sloan is a 75 y.o. female.  LEVEL 5 Caveat due to inability to speak or make sense when talking. Hx provider by son.   The history is provided by a relative.  Neurologic Problem This is a new problem. Episode onset: 9 am yesterday, slurred speech, confusion. Progressive, unable to get words out but seems like trying too. Able to walk. No weakness. The problem occurs constantly. The problem has been gradually worsening.       Past Medical History:  Diagnosis Date  . Allergic rhinitis   . Arthritis    "hands; probably qwhere" (03/12/2015)  . Basal cell carcinoma ~ 2010   "leg"  . Chronic back pain   . 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 External hemorrhoid   . GERD (gastroesophageal reflux disease)   . History of radiation therapy 03/22/12 - 04/09/12   right oropharynx/neck  . HLD (hyperlipidemia)   . LBBB (left bundle branch block) dx'd 05/2014  . Non Hodgkin's lymphoma ([place])   . Osteoarthritis of right knee   . Osteopenia     Patient Active Problem List   Diagnosis Date Noted  . Pre-operative clearance 05/13/2018  . Knee osteoarthritis 05/03/2018  . Mild cognitive impairment 01/25/2018  . History of vitamin D deficiency 07/31/2017  . Stress reaction 08/04/2016  . Estrogen deficiency 07/04/2016  . Aortic atherosclerosis ([place]) 07/06/2015  . Colon cancer screening 06/29/2015  . Routine general medical examination at a health care facility 06/21/2015  . Chest pain 03/11/2015  . Left bundle branch block (LBBB) 07/06/2014  . Encounter for Medicare annual wellness exam 03/17/2013  . 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 Pain in joint, ankle and foot 03/16/2013  . Prediabetes 12/01/2011  . History of B-cell lymphoma  11/22/2011  . ARTHRITIS, HANDS, BILATERAL 12/12/2010  . Dyslipidemia 04/02/2008  . ALLERGIC RHINITIS 04/02/2008  . GERD 04/02/2008  . Osteopenia 09/13/2007  . ALLERGY 08/06/2007    Past Surgical History:  Procedure Laterality Date  . ABDOMINAL HYSTERECTOMY  1988   "partial; for fibroids"  . BASAL CELL CARCINOMA EXCISION Right ~ 2010   "leg"  . BREAST BIOPSY Left 12/03   Negative  . COLONOSCOPY  5/06   Ext. hemms  . DILATION AND CURETTAGE OF UTERUS  1987  . PARTIAL KNEE ARTHROPLASTY Right 06/08/2018   Procedure: UNICOMPARTMENTAL RIGHT KNEE;  Surgeon: Renette Butters, MD;  Location: [place];  Service: Orthopedics;  Laterality: Right;  . REPLACEMENT UNICONDYLAR JOINT KNEE Right 06/08/2018  . TONSILLECTOMY  11/07/2011   Procedure: TONSILLECTOMY;  Surgeon: Rozetta Nunnery, MD;  Location: [facility];  Service: ENT;  Laterality: N/A;  . TONSILLECTOMY  10/2011   "right was cancerous"  . TUBAL LIGATION  1987     OB History   No obstetric history on file.     Family History  Problem Relation Age of Onset  . Heart attack Father 89  . Heart disease Father   . Stroke Mother 81  . Breast cancer Paternal Aunt   . Cancer Paternal Aunt   . Cancer Paternal Aunt   . Hyperlipidemia Other        Family history  . Colon cancer Neg Hx   .  Colon polyps Neg Hx     Social History   Tobacco Use  . Smoking status: Former Smoker    Packs/day: 0.25    Years: 10.00    Pack years: 2.50    Types: Cigarettes    Quit date: 11/04/1974    Years since quitting: 46.1  . Smokeless tobacco: Never Used  Vaping Use  . Vaping Use: Never used  Substance Use Topics  . Alcohol use: Not Currently    Alcohol/week: 0.0 standard drinks    Comment: none  . Drug use: No    Home Medications Prior to Admission medications   Medication Sig Start Date End Date Taking? Authorizing Provider  aspirin EC 81 MG tablet Take 1 tablet (81 mg total) by mouth 2 (two) times daily. For DVT  prophylaxis 06/08/18  Yes Albina Billet III, PA-C  atorvastatin (LIPITOR) 80 MG tablet TAKE 1 TABLET BY MOUTH ONCE A DAY AT [facility] THE EVENING Patient taking differently: Take 80 mg by mouth every evening. 11/27/20  Yes Tower, Audrie Gallus, MD  busPIRone (BUSPAR) 15 MG tablet TAKE HALF TABLET (7.5 MG TOTAL) BY MOUTHTWICE DAILY Patient taking differently: Take 7.5 mg by mouth 2 (two) times daily. 11/27/20  Yes Tower, Audrie Gallus, MD  Calcium Citrate-Vitamin D (CALCIUM + D PO) Take 1 tablet by mouth at bedtime.   Yes [provider]  donepezil (ARICEPT) 10 MG tablet Take 1 tablet (10 mg total) by mouth at bedtime. 11/27/20  Yes Tower, Audrie Gallus, MD  Multiple Vitamin (MULTIVITAMIN WITH MINERALS) TABS tablet Take 1 tablet by mouth every evening.   Yes [provider]  omeprazole (PRILOSEC) 20 MG capsule TAKE 1 CAPSULE BY MOUTH DAILY AS NEEDED FOR HEARTBURN Patient taking differently: Take 20 mg by mouth daily as needed (heartburn). 11/27/20  Yes Tower, Audrie Gallus, MD    Allergies    Pnu-imune [pneumococcal polysaccharide vaccine]  Review of Systems   Review of Systems  Unable to perform ROS: Mental status change    Physical Exam Updated Vital Signs  ED Triage Vitals  Enc Vitals Group     BP 01/01/21 2044 (!) 161/82     Pulse Rate Jan 01, 2021 2044 87     Resp 01-01-21 2044 20     Temp 01-01-21 2044 100.1 F (37.8 C)     Temp Source Jan 01, 2021 2044 Oral     SpO2 2021-01-01 2044 97 %     Weight 01/01/2021 2300 118 lb 6.2 oz (53.7 kg)     Height --      Head Circumference --      Peak Flow --      Pain Score 2021/01/01 2044 0     Pain Loc --      Pain Edu? --      Excl. in GC? --     Physical Exam Vitals and nursing note reviewed.  Constitutional:      General: She is not in acute distress.    Appearance: She is well-developed and well-nourished. She is not ill-appearing.  HENT:     Head: Normocephalic and atraumatic.     Nose: Nose normal.     Mouth/Throat:     Mouth: Mucous  membranes are moist.  Eyes:     Extraocular Movements: Extraocular movements intact.     Conjunctiva/sclera: Conjunctivae normal.     Pupils: Pupils are equal, round, and reactive to light.  Cardiovascular:     Rate and Rhythm: Normal rate and regular rhythm.  Pulses: Normal pulses.     Heart sounds: Normal heart sounds. No murmur heard.   Pulmonary:     Effort: Pulmonary effort is normal. No respiratory distress.     Breath sounds: Normal breath sounds.  Abdominal:     Palpations: Abdomen is soft.     Tenderness: There is no abdominal tenderness.  Musculoskeletal:        General: No edema. Normal range of motion.     Cervical back: Normal range of motion and neck supple. No rigidity.  Skin:    General: Skin is warm and dry.     Capillary Refill: Capillary refill takes less than 2 seconds.  Neurological:     Mental Status: She is alert.     Cranial Nerves: No cranial nerve deficit.     Motor: No weakness.     Comments: Patient with aphasia, she appears to want to say certain words but gets frustrated when she just monitors, she moves all extremities fairly well with good strength, difficult to assess sensation due to speech difficulty, no obvious facial droop, she follows commands and she is alert  Psychiatric:        Mood and Affect: Mood and affect normal.     ED Results / Procedures / Treatments   Labs (all labs ordered are listed, but only abnormal results are displayed) Labs Reviewed  CBC - Abnormal; Notable for the following components:      Result Value   WBC 15.1 (*)    All other components within normal limits  DIFFERENTIAL - Abnormal; Notable for the following components:   Neutro Abs 12.6 (*)    Monocytes Absolute 1.2 (*)    Abs Immature Granulocytes 0.11 (*)    All other components within normal limits  COMPREHENSIVE METABOLIC PANEL - Abnormal; Notable for the following components:   Glucose, Bld 140 (*)    BUN 38 (*)    Calcium 8.7 (*)    All other  components within normal limits  I-STAT CHEM 8, ED - Abnormal; Notable for the following components:   Potassium 3.4 (*)    BUN 39 (*)    Glucose, Bld 135 (*)    Calcium, Ion 1.07 (*)    All other components within normal limits  RESP PANEL BY RT-PCR (FLU A&B, COVID) ARPGX2  URINE CULTURE  CULTURE, BLOOD (ROUTINE X 2)  CULTURE, BLOOD (ROUTINE X 2)  CSF CULTURE  GRAM STAIN  PROTIME-INR  APTT  RAPID URINE DRUG SCREEN, HOSP PERFORMED  URINALYSIS, ROUTINE W REFLEX MICROSCOPIC  LACTIC ACID, PLASMA  LACTIC ACID, PLASMA  HSV 1/2 PCR, CSF  CSF CELL COUNT WITH DIFFERENTIAL  CSF CELL COUNT WITH DIFFERENTIAL  PROTEIN AND GLUCOSE, CSF  HIV ANTIBODY (ROUTINE TESTING W REFLEX)    EKG EKG Interpretation  Date/Time:  Friday December 28 2020 20:27:24 EST Ventricular Rate:  89 PR Interval:    QRS Duration: 151 QT Interval:  412 QTC Calculation: 502 R Axis:   -37 Text Interpretation: Sinus rhythm Biatrial enlargement Left bundle branch block Confirmed by Lennice Sites [phone]) on 01/19/2021 9:48:00 PM   Radiology CT Angio Head W or Wo Contrast  Result Date: 01/11/2021 CLINICAL DATA:  Initial evaluation for acute stroke, aphasia. EXAM: CT ANGIOGRAPHY HEAD AND NECK CT PERFUSION BRAIN TECHNIQUE: Multidetector CT imaging of the head and neck was performed using the standard protocol during bolus administration of intravenous contrast. Multiplanar CT image reconstructions and MIPs were obtained to evaluate the vascular anatomy. Carotid stenosis measurements (  when applicable) are obtained utilizing NASCET criteria, using the distal internal carotid diameter as the denominator. Multiphase CT imaging of the brain was performed following IV bolus contrast injection. Subsequent parametric perfusion maps were calculated using RAPID software. CONTRAST:  175mL OMNIPAQUE IOHEXOL 350 MG/ML SOLN COMPARISON:  Comparison made with prior head CT from earlier the same day. FINDINGS: CTA NECK FINDINGS Aortic arch:  Visualized aortic arch of normal caliber with normal 3 vessel morphology. Mild atheromatous change about the arch and origin of the great vessels. Eccentric calcified plaque at the proximal left subclavian artery with short-segment stenosis of up to 70% (series 7, image 305). No other hemodynamically significant stenosis about the proximal great vessels. Right carotid system: Right CCA patent from its origin to the bifurcation without stenosis. Eccentric calcified plaque at the right bifurcation/proximal right ICA with associated stenosis of up to 50% by NASCET criteria. Right ICA otherwise widely patent distally without stenosis, dissection or occlusion. Left carotid system: Left CCA patent from its origin to the bifurcation without stenosis. Eccentric calcified plaque at the left bifurcation with associated stenosis of up to 40% by NASCET criteria. Left ICA otherwise widely patent distally to the skull base without stenosis, dissection or occlusion. Vertebral arteries: Both vertebral arteries arise from subclavian arteries. Vertebral arteries patent within the neck without significant stenosis, dissection or occlusion. Skeleton: No visible acute osseous abnormality. Advance degenerative spondylosis at C3-4 through C6-7, with progressive 4 mm anterolisthesis of C3 on C4. Heterotopic calcification adjacent to the right C3-4 facet suspected to reflect post treatment change. Other neck: No other acute soft tissue abnormality within the neck. No visible mass or adenopathy. Upper chest: Mild layering secretions noted within the subglottic trachea. Visualized upper chest demonstrates no other acute finding. Review of the MIP images confirms the above findings CTA HEAD FINDINGS Anterior circulation: Petrous segments widely patent bilaterally. Mild atheromatous change within the carotid siphons without hemodynamically significant stenosis. A1 segments patent bilaterally. Normal anterior communicating artery complex.  Anterior cerebral arteries widely patent to their distal aspects. No M1 stenosis or occlusion. Normal MCA bifurcations. Distal MCA branches well perfused and symmetric. Posterior circulation: Vertebral arteries widely patent to the vertebrobasilar junction without stenosis. Both PICA origins patent and normal. Basilar widely patent to its distal aspect. Superior cerebellar arteries patent bilaterally. Both PCAs primarily supplied via the basilar well perfused to their distal aspects. Venous sinuses: Patent. Anatomic variants: None significant.  No aneurysm. Review of the MIP images confirms the above findings CT Brain Perfusion Findings: ASPECTS: Not calculated. CBF (<30%) Volume: 37mL Perfusion (Tmax>6.0s) volume: 31mL Mismatch Volume: 37mL Infarction Location:Negative CT perfusion with no evidence for acute ischemia. There is question of elevated cerebral blood flow and cerebral blood volume within the left insular/subinsular region on dedicated perfusion maps, corresponding with signal change on prior noncontrast CT. Finding is indeterminate, but could reflect changes of acute seizure or possibly infection/encephalitis. IMPRESSION: CTA HEAD AND NECK IMPRESSION: 1. Negative CTA for emergent large vessel occlusion. 2. Atheromatous plaque about the carotid bifurcations with estimated stenoses of up to 50% on the right and 40% on the left. 3. Scattered secretions within the subglottic airway. Finding suggests that this patient may be at risk for aspiration. CT PERFUSION IMPRESSION: 1. Negative CT perfusion for acute ischemia. 2. Question elevated cerebral blood flow and cerebral blood volume within the left insular/subinsular region, corresponding with signal change on prior noncontrast head CT. Finding is nonspecific, but can be seen in the setting of seizure or possibly acute encephalitis/infection.  Possible herpes infection could be considered given location. Correlation with LP and CSF values as well as EEG  recommended as warranted. Additionally, further evaluation with dedicated brain MRI, with and without contrast, recommended for further evaluation. Case discussed by telephone at the time of interpretation on 01/17/2021 at 11:48 pm with provider Dr. Leonel Ramsay. Electronically Signed   By: Jeannine Boga M.D.   On: 01/20/2021 23:57   CT Head Wo Contrast  Result Date: 01/02/2021 CLINICAL DATA:  Aphasia. EXAM: CT HEAD WITHOUT CONTRAST TECHNIQUE: Contiguous axial images were obtained from the base of the skull through the vertex without intravenous contrast. COMPARISON:  None. FINDINGS: Brain: Indeterminate vague hypodensity within the left insular region (4:14). No evidence of large-territorial acute infarction. No parenchymal hemorrhage. No mass lesion. No extra-axial collection. No mass effect or midline shift. No hydrocephalus. Basilar cisterns are patent. Vascular: No hyperdense vessel. Skull: No acute fracture or focal lesion. Sinuses/Orbits: Paranasal sinuses and mastoid air cells are clear. The orbits are unremarkable. Other: None. IMPRESSION: 1. Indeterminate vague hypodensity within the left insular region. If high clinical suspicion for acute infarction, consider MRI brain noncontrast for further evaluation. 2. Otherwise no large territorial acute infarction or acute intracranial hemorrhage. Electronically Signed   By: Iven Finn M.D.   On: 01/18/2021 21:00   CT Angio Neck W and/or Wo Contrast  Result Date: 01/14/2021 CLINICAL DATA:  Initial evaluation for acute stroke, aphasia. EXAM: CT ANGIOGRAPHY HEAD AND NECK CT PERFUSION BRAIN TECHNIQUE: Multidetector CT imaging of the head and neck was performed using the standard protocol during bolus administration of intravenous contrast. Multiplanar CT image reconstructions and MIPs were obtained to evaluate the vascular anatomy. Carotid stenosis measurements (when applicable) are obtained utilizing NASCET criteria, using the distal internal carotid  diameter as the denominator. Multiphase CT imaging of the brain was performed following IV bolus contrast injection. Subsequent parametric perfusion maps were calculated using RAPID software. CONTRAST:  121mL OMNIPAQUE IOHEXOL 350 MG/ML SOLN COMPARISON:  Comparison made with prior head CT from earlier the same day. FINDINGS: CTA NECK FINDINGS Aortic arch: Visualized aortic arch of normal caliber with normal 3 vessel morphology. Mild atheromatous change about the arch and origin of the great vessels. Eccentric calcified plaque at the proximal left subclavian artery with short-segment stenosis of up to 70% (series 7, image 305). No other hemodynamically significant stenosis about the proximal great vessels. Right carotid system: Right CCA patent from its origin to the bifurcation without stenosis. Eccentric calcified plaque at the right bifurcation/proximal right ICA with associated stenosis of up to 50% by NASCET criteria. Right ICA otherwise widely patent distally without stenosis, dissection or occlusion. Left carotid system: Left CCA patent from its origin to the bifurcation without stenosis. Eccentric calcified plaque at the left bifurcation with associated stenosis of up to 40% by NASCET criteria. Left ICA otherwise widely patent distally to the skull base without stenosis, dissection or occlusion. Vertebral arteries: Both vertebral arteries arise from subclavian arteries. Vertebral arteries patent within the neck without significant stenosis, dissection or occlusion. Skeleton: No visible acute osseous abnormality. Advance degenerative spondylosis at C3-4 through C6-7, with progressive 4 mm anterolisthesis of C3 on C4. Heterotopic calcification adjacent to the right C3-4 facet suspected to reflect post treatment change. Other neck: No other acute soft tissue abnormality within the neck. No visible mass or adenopathy. Upper chest: Mild layering secretions noted within the subglottic trachea. Visualized upper  chest demonstrates no other acute finding. Review of the MIP images confirms the above findings  CTA HEAD FINDINGS Anterior circulation: Petrous segments widely patent bilaterally. Mild atheromatous change within the carotid siphons without hemodynamically significant stenosis. A1 segments patent bilaterally. Normal anterior communicating artery complex. Anterior cerebral arteries widely patent to their distal aspects. No M1 stenosis or occlusion. Normal MCA bifurcations. Distal MCA branches well perfused and symmetric. Posterior circulation: Vertebral arteries widely patent to the vertebrobasilar junction without stenosis. Both PICA origins patent and normal. Basilar widely patent to its distal aspect. Superior cerebellar arteries patent bilaterally. Both PCAs primarily supplied via the basilar well perfused to their distal aspects. Venous sinuses: Patent. Anatomic variants: None significant.  No aneurysm. Review of the MIP images confirms the above findings CT Brain Perfusion Findings: ASPECTS: Not calculated. CBF (<30%) Volume: 48mL Perfusion (Tmax>6.0s) volume: 46mL Mismatch Volume: 33mL Infarction Location:Negative CT perfusion with no evidence for acute ischemia. There is question of elevated cerebral blood flow and cerebral blood volume within the left insular/subinsular region on dedicated perfusion maps, corresponding with signal change on prior noncontrast CT. Finding is indeterminate, but could reflect changes of acute seizure or possibly infection/encephalitis. IMPRESSION: CTA HEAD AND NECK IMPRESSION: 1. Negative CTA for emergent large vessel occlusion. 2. Atheromatous plaque about the carotid bifurcations with estimated stenoses of up to 50% on the right and 40% on the left. 3. Scattered secretions within the subglottic airway. Finding suggests that this patient may be at risk for aspiration. CT PERFUSION IMPRESSION: 1. Negative CT perfusion for acute ischemia. 2. Question elevated cerebral blood flow and  cerebral blood volume within the left insular/subinsular region, corresponding with signal change on prior noncontrast head CT. Finding is nonspecific, but can be seen in the setting of seizure or possibly acute encephalitis/infection. Possible herpes infection could be considered given location. Correlation with LP and CSF values as well as EEG recommended as warranted. Additionally, further evaluation with dedicated brain MRI, with and without contrast, recommended for further evaluation. Case discussed by telephone at the time of interpretation on 12/31/2020 at 11:48 pm with provider Dr. Leonel Ramsay. Electronically Signed   By: Jeannine Boga M.D.   On: 12/30/2020 23:57   CT CEREBRAL PERFUSION W CONTRAST  Result Date: 01/07/2021 CLINICAL DATA:  Initial evaluation for acute stroke, aphasia. EXAM: CT ANGIOGRAPHY HEAD AND NECK CT PERFUSION BRAIN TECHNIQUE: Multidetector CT imaging of the head and neck was performed using the standard protocol during bolus administration of intravenous contrast. Multiplanar CT image reconstructions and MIPs were obtained to evaluate the vascular anatomy. Carotid stenosis measurements (when applicable) are obtained utilizing NASCET criteria, using the distal internal carotid diameter as the denominator. Multiphase CT imaging of the brain was performed following IV bolus contrast injection. Subsequent parametric perfusion maps were calculated using RAPID software. CONTRAST:  149mL OMNIPAQUE IOHEXOL 350 MG/ML SOLN COMPARISON:  Comparison made with prior head CT from earlier the same day. FINDINGS: CTA NECK FINDINGS Aortic arch: Visualized aortic arch of normal caliber with normal 3 vessel morphology. Mild atheromatous change about the arch and origin of the great vessels. Eccentric calcified plaque at the proximal left subclavian artery with short-segment stenosis of up to 70% (series 7, image 305). No other hemodynamically significant stenosis about the proximal great vessels.  Right carotid system: Right CCA patent from its origin to the bifurcation without stenosis. Eccentric calcified plaque at the right bifurcation/proximal right ICA with associated stenosis of up to 50% by NASCET criteria. Right ICA otherwise widely patent distally without stenosis, dissection or occlusion. Left carotid system: Left CCA patent from its origin to the bifurcation  without stenosis. Eccentric calcified plaque at the left bifurcation with associated stenosis of up to 40% by NASCET criteria. Left ICA otherwise widely patent distally to the skull base without stenosis, dissection or occlusion. Vertebral arteries: Both vertebral arteries arise from subclavian arteries. Vertebral arteries patent within the neck without significant stenosis, dissection or occlusion. Skeleton: No visible acute osseous abnormality. Advance degenerative spondylosis at C3-4 through C6-7, with progressive 4 mm anterolisthesis of C3 on C4. Heterotopic calcification adjacent to the right C3-4 facet suspected to reflect post treatment change. Other neck: No other acute soft tissue abnormality within the neck. No visible mass or adenopathy. Upper chest: Mild layering secretions noted within the subglottic trachea. Visualized upper chest demonstrates no other acute finding. Review of the MIP images confirms the above findings CTA HEAD FINDINGS Anterior circulation: Petrous segments widely patent bilaterally. Mild atheromatous change within the carotid siphons without hemodynamically significant stenosis. A1 segments patent bilaterally. Normal anterior communicating artery complex. Anterior cerebral arteries widely patent to their distal aspects. No M1 stenosis or occlusion. Normal MCA bifurcations. Distal MCA branches well perfused and symmetric. Posterior circulation: Vertebral arteries widely patent to the vertebrobasilar junction without stenosis. Both PICA origins patent and normal. Basilar widely patent to its distal aspect.  Superior cerebellar arteries patent bilaterally. Both PCAs primarily supplied via the basilar well perfused to their distal aspects. Venous sinuses: Patent. Anatomic variants: None significant.  No aneurysm. Review of the MIP images confirms the above findings CT Brain Perfusion Findings: ASPECTS: Not calculated. CBF (<30%) Volume: 70mL Perfusion (Tmax>6.0s) volume: 16mL Mismatch Volume: 70mL Infarction Location:Negative CT perfusion with no evidence for acute ischemia. There is question of elevated cerebral blood flow and cerebral blood volume within the left insular/subinsular region on dedicated perfusion maps, corresponding with signal change on prior noncontrast CT. Finding is indeterminate, but could reflect changes of acute seizure or possibly infection/encephalitis. IMPRESSION: CTA HEAD AND NECK IMPRESSION: 1. Negative CTA for emergent large vessel occlusion. 2. Atheromatous plaque about the carotid bifurcations with estimated stenoses of up to 50% on the right and 40% on the left. 3. Scattered secretions within the subglottic airway. Finding suggests that this patient may be at risk for aspiration. CT PERFUSION IMPRESSION: 1. Negative CT perfusion for acute ischemia. 2. Question elevated cerebral blood flow and cerebral blood volume within the left insular/subinsular region, corresponding with signal change on prior noncontrast head CT. Finding is nonspecific, but can be seen in the setting of seizure or possibly acute encephalitis/infection. Possible herpes infection could be considered given location. Correlation with LP and CSF values as well as EEG recommended as warranted. Additionally, further evaluation with dedicated brain MRI, with and without contrast, recommended for further evaluation. Case discussed by telephone at the time of interpretation on 01/03/2021 at 11:48 pm with provider Dr. Leonel Ramsay. Electronically Signed   By: Jeannine Boga M.D.   On: 12/27/2020 23:57   DG Chest Portable 1  View  Result Date: 12/27/2020 CLINICAL DATA:  Aphasia.  Acute mental status change. EXAM: PORTABLE CHEST 1 VIEW COMPARISON:  March 11, 2015 FINDINGS: The heart size and mediastinal contours are within normal limits. Both lungs are clear. The visualized skeletal structures are unremarkable. IMPRESSION: No active disease. Electronically Signed   By: Dorise Bullion III M.D   On: 01/03/2021 21:28    Procedures Procedures (including critical care time)  Medications Ordered in ED Medications  acetaminophen (TYLENOL) suppository 650 mg (has no administration in time range)  sodium chloride 0.9 % bolus 500 mL (has  no administration in time range)  cefTRIAXone (ROCEPHIN) 2 g in sodium chloride 0.9 % 100 mL IVPB (has no administration in time range)  acyclovir (ZOVIRAX) 535 mg in dextrose 5 % 100 mL IVPB (has no administration in time range)  LORazepam (ATIVAN) injection 1 mg (has no administration in time range)  iohexol (OMNIPAQUE) 350 MG/ML injection 100 mL (100 mLs Intravenous Contrast Given 01/17/2021 2309)    ED Course  I have reviewed the triage vital signs and the nursing notes.  Pertinent labs & imaging results that were available during my care of the patient were reviewed by me and considered in my medical decision making (see chart for details).    MDM Rules/Calculators/A&P                          MIREILY WICHERS is a 75 year old female with history of lymphoma in remission, reflux who presents to the ED with strokelike symptoms.  Patient arrives with fever of 102.2 but otherwise unremarkable vitals.  Patient with last known normal at 9 AM yesterday.  Has progressively gotten worse.  Patient woke up with some slurred speech yesterday and some confusion that seems to have gotten worse.  She appears to be getting frustrated as she does not able to get words out.  Does not seem like she is following commands very well.  She did eat earlier this afternoon and she has been walking around  without any issues.  She has not complained of any headaches.  They were unaware of any fever until we checked rectal temperature today.  Patient here with family who was able to provide history as she is too confused to provide history but she does not appear to be in any distress.  She appears to move all of her extremities.  She does not appear to have any meningeal signs on exam.  Sepsis work-up initiated as well as stroke work-up.  CT scan of the head shows vague hypodensity in the left insular region concerning for acute infarction.  Talked with Dr. Leonel Ramsay with neurology who recommends CT perfusion study and CTA of head and neck to further evaluate.  He came down to the ED to evaluate the patient.  In that time when patient originally arrived she did not have a fever but only checked rectal temperature she did have a fever.  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  Seems less likely to have bacterial meningitis.  Will empirically give IV Rocephin and IV acyclovir.  Will get MRI to rule out stroke and if there is no stroke patient will likely require an LP.  She does have a mild white count of 15.  Otherwise lab work is unremarkable.  Patient pending chest x-ray, MRI, urinalysis.  Overall patient handed off to oncoming ED staff with these things pending.  Neurology continues to follow.  Please see their note for further results, evaluation, disposition of the patient.  This chart was dictated using voice recognition software.  Despite best efforts to proofread,  errors can occur which can change the documentation meaning.   Final Clinical Impression(s) / ED Diagnoses Final diagnoses:  Fever, unspecified fever cause  Aphasia  Confusion    Rx / DC Orders ED Discharge Orders    None       Lennice Sites, DO 12/29/20 0003

## 2020-12-28 NOTE — ED Triage Notes (Signed)
Pt arrives to ED BIB [facility] due to Aphasia. Per EMS pt's LKW was at 0900 Yesterday (12/27/2020). Per EMS pt son stated that pt would "mumble and struggle to get words out when talked to.   BP 148/86 HR 88 CBG 256

## 2020-12-29 ENCOUNTER — Inpatient Hospital Stay (HOSPITAL_COMMUNITY): Payer: Medicare Other

## 2020-12-29 ENCOUNTER — Encounter (HOSPITAL_COMMUNITY): Payer: Self-pay | Admitting: Radiology

## 2020-12-29 DIAGNOSIS — Z85828 Personal history of other malignant neoplasm of skin: Secondary | ICD-10-CM | POA: Diagnosis not present

## 2020-12-29 DIAGNOSIS — A419 Sepsis, unspecified organism: Principal | ICD-10-CM

## 2020-12-29 DIAGNOSIS — J9811 Atelectasis: Secondary | ICD-10-CM | POA: Diagnosis present

## 2020-12-29 DIAGNOSIS — G936 Cerebral edema: Secondary | ICD-10-CM | POA: Diagnosis present

## 2020-12-29 DIAGNOSIS — B004 Herpesviral encephalitis: Secondary | ICD-10-CM | POA: Diagnosis present

## 2020-12-29 DIAGNOSIS — G9349 Other encephalopathy: Secondary | ICD-10-CM | POA: Diagnosis present

## 2020-12-29 DIAGNOSIS — M7989 Other specified soft tissue disorders: Secondary | ICD-10-CM | POA: Diagnosis not present

## 2020-12-29 DIAGNOSIS — J9601 Acute respiratory failure with hypoxia: Secondary | ICD-10-CM

## 2020-12-29 DIAGNOSIS — R9431 Abnormal electrocardiogram [ECG] [EKG]: Secondary | ICD-10-CM | POA: Diagnosis present

## 2020-12-29 DIAGNOSIS — G0490 Encephalitis and encephalomyelitis, unspecified: Secondary | ICD-10-CM | POA: Diagnosis present

## 2020-12-29 DIAGNOSIS — G9340 Encephalopathy, unspecified: Secondary | ICD-10-CM | POA: Diagnosis not present

## 2020-12-29 DIAGNOSIS — J690 Pneumonitis due to inhalation of food and vomit: Secondary | ICD-10-CM | POA: Diagnosis present

## 2020-12-29 DIAGNOSIS — R4701 Aphasia: Secondary | ICD-10-CM

## 2020-12-29 DIAGNOSIS — G40909 Epilepsy, unspecified, not intractable, without status epilepticus: Secondary | ICD-10-CM | POA: Diagnosis present

## 2020-12-29 DIAGNOSIS — E785 Hyperlipidemia, unspecified: Secondary | ICD-10-CM | POA: Diagnosis present

## 2020-12-29 DIAGNOSIS — R4182 Altered mental status, unspecified: Secondary | ICD-10-CM

## 2020-12-29 DIAGNOSIS — Z66 Do not resuscitate: Secondary | ICD-10-CM | POA: Diagnosis present

## 2020-12-29 DIAGNOSIS — C8330 Diffuse large B-cell lymphoma, unspecified site: Secondary | ICD-10-CM | POA: Diagnosis present

## 2020-12-29 DIAGNOSIS — I5022 Chronic systolic (congestive) heart failure: Secondary | ICD-10-CM | POA: Diagnosis present

## 2020-12-29 DIAGNOSIS — J1282 Pneumonia due to coronavirus disease 2019: Secondary | ICD-10-CM | POA: Diagnosis present

## 2020-12-29 DIAGNOSIS — Z515 Encounter for palliative care: Secondary | ICD-10-CM | POA: Diagnosis not present

## 2020-12-29 DIAGNOSIS — G8191 Hemiplegia, unspecified affecting right dominant side: Secondary | ICD-10-CM | POA: Diagnosis present

## 2020-12-29 DIAGNOSIS — Z923 Personal history of irradiation: Secondary | ICD-10-CM | POA: Diagnosis not present

## 2020-12-29 DIAGNOSIS — U071 COVID-19: Secondary | ICD-10-CM | POA: Diagnosis present

## 2020-12-29 DIAGNOSIS — I82A11 Acute embolism and thrombosis of right axillary vein: Secondary | ICD-10-CM | POA: Diagnosis not present

## 2020-12-29 DIAGNOSIS — F0390 Unspecified dementia without behavioral disturbance: Secondary | ICD-10-CM | POA: Diagnosis present

## 2020-12-29 DIAGNOSIS — K219 Gastro-esophageal reflux disease without esophagitis: Secondary | ICD-10-CM | POA: Diagnosis present

## 2020-12-29 LAB — URINALYSIS, ROUTINE W REFLEX MICROSCOPIC
Bilirubin Urine: NEGATIVE
Glucose, UA: 50 mg/dL — AB
Ketones, ur: 5 mg/dL — AB
Nitrite: NEGATIVE
Protein, ur: 100 mg/dL — AB
Specific Gravity, Urine: >1.046 — ABNORMAL HIGH (ref 1.005–1.030)
WBC, UA: >50 WBC/hpf — ABNORMAL HIGH (ref 0–5)
pH: 5 (ref 5.0–8.0)

## 2020-12-29 LAB — HIV ANTIBODY (ROUTINE TESTING W REFLEX): HIV Screen 4th Generation wRfx: NONREACTIVE

## 2020-12-29 LAB — RESP PANEL BY RT-PCR (FLU A&B, COVID) ARPGX2
Influenza A by PCR: NEGATIVE
Influenza B by PCR: NEGATIVE
SARS Coronavirus 2 by RT PCR: POSITIVE — AB

## 2020-12-29 LAB — TRIGLYCERIDES: Triglycerides: 93 mg/dL (ref <150)

## 2020-12-29 LAB — RESPIRATORY PANEL BY PCR

## 2020-12-29 LAB — D-DIMER, QUANTITATIVE: D-Dimer, Quant: 5.17 ug/mL-FEU — ABNORMAL HIGH (ref 0.00–0.50)

## 2020-12-29 LAB — LACTIC ACID, PLASMA
Lactic Acid, Venous: 2.3 mmol/L (ref 0.5–1.9)
Lactic Acid, Venous: 2.3 mmol/L (ref 0.5–1.9)

## 2020-12-29 LAB — CSF CELL COUNT WITH DIFFERENTIAL
Eosinophils, CSF: 0 % (ref 0–1)
Eosinophils, CSF: 0 % (ref 0–1)
Lymphs, CSF: 74 % (ref 40–80)
Lymphs, CSF: 81 % — ABNORMAL HIGH (ref 40–80)
Monocyte-Macrophage-Spinal Fluid: 19 % (ref 15–45)
Monocyte-Macrophage-Spinal Fluid: 20 % (ref 15–45)
RBC Count, CSF: 27 /mm3 — ABNORMAL HIGH
RBC Count, CSF: 50 /mm3 — ABNORMAL HIGH
Segmented Neutrophils-CSF: 0 % (ref 0–6)
Segmented Neutrophils-CSF: 0 % (ref 0–6)
Tube #: 1
Tube #: 4
WBC, CSF: 4 /mm3 (ref 0–5)
WBC, CSF: 9 /mm3 — ABNORMAL HIGH (ref 0–5)

## 2020-12-29 LAB — LACTATE DEHYDROGENASE: LDH: 279 U/L — ABNORMAL HIGH (ref 98–192)

## 2020-12-29 LAB — PROTEIN AND GLUCOSE, CSF
Glucose, CSF: 82 mg/dL — ABNORMAL HIGH (ref 40–70)
Total  Protein, CSF: 68 mg/dL — ABNORMAL HIGH (ref 15–45)

## 2020-12-29 LAB — RAPID URINE DRUG SCREEN, HOSP PERFORMED
Amphetamines: NOT DETECTED
Barbiturates: NOT DETECTED
Benzodiazepines: NOT DETECTED
Cocaine: NOT DETECTED
Opiates: NOT DETECTED
Tetrahydrocannabinol: NOT DETECTED

## 2020-12-29 LAB — GLUCOSE, CAPILLARY
Glucose-Capillary: 116 mg/dL — ABNORMAL HIGH (ref 70–99)
Glucose-Capillary: 138 mg/dL — ABNORMAL HIGH (ref 70–99)
Glucose-Capillary: 64 mg/dL — ABNORMAL LOW (ref 70–99)
Glucose-Capillary: 68 mg/dL — ABNORMAL LOW (ref 70–99)
Glucose-Capillary: 94 mg/dL (ref 70–99)

## 2020-12-29 LAB — CRYPTOCOCCAL ANTIGEN, CSF: Crypto Ag: NEGATIVE

## 2020-12-29 LAB — TROPONIN I (HIGH SENSITIVITY): Troponin I (High Sensitivity): 42 ng/L — ABNORMAL HIGH (ref <18)

## 2020-12-29 LAB — FIBRINOGEN: Fibrinogen: 534 mg/dL — ABNORMAL HIGH (ref 210–475)

## 2020-12-29 LAB — MRSA PCR SCREENING: MRSA by PCR: NEGATIVE

## 2020-12-29 LAB — FERRITIN: Ferritin: 278 ng/mL (ref 11–307)

## 2020-12-29 LAB — C-REACTIVE PROTEIN: CRP: 0.7 mg/dL (ref <1.0)

## 2020-12-29 LAB — PROCALCITONIN: Procalcitonin: <0.1 ng/mL

## 2020-12-29 MED ORDER — POLYETHYLENE GLYCOL 3350 17 G PO PACK
17.0000 g | PACK | Freq: Every day | ORAL | Status: DC | PRN
  Filled 2020-12-29: qty 1

## 2020-12-29 MED ORDER — LEVETIRACETAM IN NACL 500 MG/100ML IV SOLN
500.0000 mg | Freq: Two times a day (BID) | INTRAVENOUS | Status: DC
  Administered 2020-12-29 – 2020-12-30 (×2): 500 mg via INTRAVENOUS
  Filled 2020-12-29 (×2): qty 100

## 2020-12-29 MED ORDER — INSULIN ASPART 100 UNIT/ML ~~LOC~~ SOLN
0.0000 [IU] | SUBCUTANEOUS | Status: DC
  Administered 2020-12-29 – 2020-12-31 (×5): 2 [IU] via SUBCUTANEOUS
  Administered 2020-12-31: 3 [IU] via SUBCUTANEOUS
  Administered 2020-12-31 (×2): 2 [IU] via SUBCUTANEOUS
  Administered 2021-01-01 (×2): 3 [IU] via SUBCUTANEOUS
  Administered 2021-01-01: 2 [IU] via SUBCUTANEOUS
  Administered 2021-01-02: 3 [IU] via SUBCUTANEOUS
  Administered 2021-01-02 – 2021-01-03 (×5): 2 [IU] via SUBCUTANEOUS
  Administered 2021-01-03 (×2): 3 [IU] via SUBCUTANEOUS

## 2020-12-29 MED ORDER — SODIUM CHLORIDE 0.9 % IV SOLN
2.0000 g | Freq: Two times a day (BID) | INTRAVENOUS | Status: DC
  Filled 2020-12-29 (×3): qty 20

## 2020-12-29 MED ORDER — CHLORHEXIDINE GLUCONATE CLOTH 2 % EX PADS
6.0000 | MEDICATED_PAD | Freq: Every day | CUTANEOUS | Status: DC
  Administered 2020-12-30 – 2021-01-04 (×6): 6 via TOPICAL

## 2020-12-29 MED ORDER — SODIUM CHLORIDE 0.9 % IV SOLN
3.0000 g | Freq: Three times a day (TID) | INTRAVENOUS | Status: AC
  Administered 2020-12-29 – 2021-01-02 (×14): 3 g via INTRAVENOUS
  Filled 2020-12-29 (×3): qty 3
  Filled 2020-12-29: qty 0.06
  Filled 2020-12-29: qty 0.14
  Filled 2020-12-29 (×2): qty 0.06
  Filled 2020-12-29: qty 0.14
  Filled 2020-12-29 (×4): qty 3
  Filled 2020-12-29 (×2): qty 0.14

## 2020-12-29 MED ORDER — ENOXAPARIN SODIUM 40 MG/0.4ML ~~LOC~~ SOLN
40.0000 mg | SUBCUTANEOUS | Status: DC
  Administered 2020-12-29 – 2021-01-03 (×6): 40 mg via SUBCUTANEOUS
  Filled 2020-12-29 (×6): qty 0.4

## 2020-12-29 MED ORDER — VANCOMYCIN HCL 750 MG/150ML IV SOLN: 750.0000 mg | INTRAVENOUS | Status: DC

## 2020-12-29 MED ORDER — DEXTROSE 50 % IV SOLN
12.5000 g | INTRAVENOUS | Status: AC
  Administered 2020-12-29: 12.5 g via INTRAVENOUS

## 2020-12-29 MED ORDER — DEXTROSE 50 % IV SOLN: 12.5000 g | INTRAVENOUS | Status: AC

## 2020-12-29 MED ORDER — ACETAMINOPHEN 650 MG RE SUPP
650.0000 mg | RECTAL | Status: DC | PRN
  Administered 2020-12-29 – 2021-01-01 (×5): 650 mg via RECTAL
  Filled 2020-12-29 (×5): qty 1

## 2020-12-29 MED ORDER — DEXTROSE 50 % IV SOLN
INTRAVENOUS | Status: AC
  Administered 2020-12-29: 12.5 g via INTRAVENOUS
  Filled 2020-12-29: qty 50

## 2020-12-29 MED ORDER — CHLORHEXIDINE GLUCONATE 0.12 % MT SOLN
15.0000 mL | Freq: Two times a day (BID) | OROMUCOSAL | Status: DC
  Administered 2020-12-29 – 2021-01-03 (×12): 15 mL via OROMUCOSAL
  Filled 2020-12-29 (×9): qty 15

## 2020-12-29 MED ORDER — VANCOMYCIN HCL IN DEXTROSE 1-5 GM/200ML-% IV SOLN
1000.0000 mg | Freq: Once | INTRAVENOUS | Status: AC
  Administered 2020-12-29: 1000 mg via INTRAVENOUS
  Filled 2020-12-29: qty 200

## 2020-12-29 MED ORDER — DOCUSATE SODIUM 100 MG PO CAPS: 100.0000 mg | ORAL_CAPSULE | Freq: Two times a day (BID) | ORAL | Status: DC | PRN

## 2020-12-29 MED ORDER — SODIUM CHLORIDE 0.9 % IV SOLN: 2.0000 g | INTRAVENOUS | Status: DC

## 2020-12-29 MED ORDER — ORAL CARE MOUTH RINSE
15.0000 mL | Freq: Two times a day (BID) | OROMUCOSAL | Status: DC
  Administered 2020-12-29 – 2021-01-03 (×12): 15 mL via OROMUCOSAL

## 2020-12-29 MED ORDER — SODIUM CHLORIDE 0.9 % IV SOLN
2.0000 g | Freq: Four times a day (QID) | INTRAVENOUS | Status: DC
  Administered 2020-12-29: 2 g via INTRAVENOUS
  Filled 2020-12-29 (×5): qty 2000

## 2020-12-29 MED ORDER — LEVETIRACETAM IN NACL 1000 MG/100ML IV SOLN
1000.0000 mg | Freq: Once | INTRAVENOUS | Status: AC
  Administered 2020-12-29: 1000 mg via INTRAVENOUS
  Filled 2020-12-29: qty 100

## 2020-12-29 NOTE — Progress Notes (Signed)
EEG complete - results pending 

## 2020-12-29 NOTE — H&P (Signed)
NAME:  Tara Sloan, MRN:  [id number], DOB:  [birth year], LOS: 0 ADMISSION DATE:  01/03/2021, CONSULTATION DATE:  12/29/2020 REFERRING MD:  TRH, CHIEF COMPLAINT:  Airway protection   Brief History:  75 yo F with a history of diffuse large B cell lymphoma in remission (2012) and CHF with mildly reduced EF who presented to ED with slurring words, aphasia, and fever and MRI concerning for encephalitis.  Initially triaged to hospital medicine, however, had progressively worsening hypoxia and tachypnea, with concern for her ability to protect her airway. Retriaged to ICU. Will plan to admit to 4N ICU.   History of Present Illness:  74 yo F with a history of diffuse large B cell lymphoma in remission (2012) and CHF with mildly reduced EF (EF 45-50% in 2015) who presented with slurring words, aphasia, fever, and MRI concerning for encephalitis, with a typical distribution for HSV encephalitis.  I gathered all of the history from [name] at bedside.  Patient started not acting quite herself on Wednesday, but still was conversational.  When her son saw her Friday night around 6pm, she was slurring words and they called EMS.  On arrival to the ED, she was only able to follow directions intermittently, and was on room air.  Nonfocal exam.  She progressed to not being able to follow any directions, but she is responsive to noxious stimuli.  LP was done in the ED after Code stroke scans were negative but MRI showed possible encephalitis.  She has had increasing O2 requirements and upper airway gurgling, though no gross aspiration event was noted.  Because of concern for airway protection, hospitalist called ICU for admission.    She was found to be COVID positive, though exhibited no signs of COVID-19 illness.  She is fully vaccinated, and her son thinks she also got a booster.    When I saw patient, she did have gurgling upper airway noise with breathing. Tachypneic, satting 99% on 8L [place] + NRB.  Eyes open,  responsive to noxious stimuli.    Past Medical History:  DLBCL CHF (45-50% in 2015)   Significant Hospital Events:    Consults:  Neurology  Procedures:  Lumbar puncture 1/7  Significant Diagnostic Tests:  CSF: protein 68, glucose 83, WBCs present.  Cell count and diff pending - Crypto Ag - HSV PCR - VZV PCR - bacterial and fungal cultures pending  HIV pending  MRI 1/7: 1. Abnormal T2/FLAIR signal intensity involving the left insula and subinsular region, with extension to involve the mesial and anterior left temporal lobe, including the left hippocampus. Similar but less pronounced changes involve the right temporal lobe and insular cortex. Given provided history, finding is highly concerning for possible acute encephalitis, with herpes encephalitis a high consideration given the location of these findings. Correlation with CSF values recommended. Primary differential considerations include an alternate nonspecific meningoencephalitis versus seizure. 2. No other acute intracranial abnormality. 3. Underlying chronic microvascular ischemic disease.  Micro Data:  CSF: protein 68, glucose 82, WBCs present.  Cell count and diff pending - Crypto Ag - HSV PCR - VZV PCR - bacterial and fungal cultures pending  HIV pending  SARS COV2 positive  Full RVP pending  Antimicrobials:  Vancomycin Ceftriaxone Ampicillin Acyclovir  Interim History / Subjective:    Objective   Blood pressure 138/68, pulse 96, temperature (!) 102.2 F (39 C), temperature source Oral, resp. rate (!) 35, weight 53.7 kg, SpO2 98 %.  Intake/Output Summary (Last 24 hours) at 12/29/2020 0537 Last data filed at 12/29/2020 4696 Gross per 24 hour  Intake 1000 ml  Output --  Net 1000 ml   Filed Weights   2021/01/14 2300  Weight: 53.7 kg    Examination: General: gurgling upper airway noise, no distress, tachpneic HENT: no oral lesions seen, though exam is limited Lungs: bilateral  rhonchorous breath sounds Cardiovascular: regular rate and rhythm Abdomen: soft, nontender, nondistended Extremities: warm, no edema Neuro:  Aphasia, not following commands.  PERRL.  +blink to threat, tracks my hand with maybe some left gaze preference. Strength seems mostly intact.  GU: no abnormalities  Resolved Hospital Problem list     Assessment & Plan:  # Acute respiratory failure:  Likely due to silent aspiration and limited ability to control secretions.  Repeat CXR while in the ED shows some R sided atelectasis, mucus plugging.  Currently protecting airway enough, but discussed with her son that she may need to be intubated for airway protection.  I suspect that she is incidentally positive for COVID- would not pursue remdesivir or decadron at this time - abx, noted below for pneumonia - NT suctioning - ICU monitoring - switch to [facility] when able, high risk of intubation  # Acute encephalopathy, concern for encephalitis on MRI:  Aphasic.  - CSF studies: sent HSV, cultures.  Added on cryptococcal Ag, VZV PCR, and fungal culture - full RVP pending - continue acyclovir, ceftriaxone, ampicillin, and vancomycin  Chronic medical problems: # history of diffuse large B cell lymphoma # CHF, mildly reduced EF in 2015   Best practice (evaluated daily)  Diet: NPO Pain/Anxiety/Delirium protocol (if indicated): n/a VAP protocol (if indicated): n/a DVT prophylaxis: lovenox GI prophylaxis: n/a Glucose control: SSI Mobility: PT Disposition: ICU  Goals of Care:  Last date of multidisciplinary goals of care discussion: n/a Family and staff present: spoke with son [name] Summary of discussion: code status is full code Follow up goals of care discussion due: n/a Code Status: Full  Labs   CBC: Recent Labs  Lab 2021/01/14 2214 14-Jan-2021 2219  WBC  --  15.1*  NEUTROABS  --  12.6*  HGB 14.3 14.2  HCT 42.0 44.1  MCV  --  92.1  PLT  --  295    Basic Metabolic Panel: Recent Labs   Lab 01-14-21 2214 2021/01/14 2219  NA 143 140  K 3.4* 3.5  CL 104 104  CO2  --  24  GLUCOSE 135* 140*  BUN 39* 38*  CREATININE 0.80 0.87  CALCIUM  --  8.7*   GFR: Estimated Creatinine Clearance: 45.2 mL/min (by C-G formula based on SCr of 0.87 mg/dL). Recent Labs  Lab 01-14-21 2219 12/29/20 0338  WBC 15.1*  --   LATICACIDVEN  --  2.3*    Liver Function Tests: Recent Labs  Lab 2021/01/14 2219  AST 30  ALT 21  ALKPHOS 50  BILITOT 0.8  PROT 7.0  ALBUMIN 3.7   No results for input(s): LIPASE, AMYLASE in the last 168 hours. No results for input(s): AMMONIA in the last 168 hours.  ABG    Component Value Date/Time   TCO2 25 2021-01-14 2214     Coagulation Profile: Recent Labs  Lab 2021-01-14 2219  INR 1.1    Cardiac Enzymes: No results for input(s): CKTOTAL, CKMB, CKMBINDEX, TROPONINI in the last 168 hours.  HbA1C: Hgb A1c MFr Bld  Date/Time Value Ref Range Status  11/20/2020 09:40 AM 5.9 4.6 - 6.5 % Final  Comment:    Glycemic Control Guidelines for People with Diabetes:Non Diabetic:  <6%Goal of Therapy: <7%Additional Action Suggested:  >8%   08/23/2019 09:33 AM 6.1 4.6 - 6.5 % Final    Comment:    Glycemic Control Guidelines for People with Diabetes:Non Diabetic:  <6%Goal of Therapy: <7%Additional Action Suggested:  >8%     CBG: No results for input(s): GLUCAP in the last 168 hours.  Review of Systems:   Unable to obtain due to patient's confused status.    Past Medical History:  She,  has a past medical history of Allergic rhinitis, Arthritis, Basal cell carcinoma (~ 2010), Chronic back pain, Diffuse large B cell lymphoma ([place]) (11/07/11), External hemorrhoid, GERD (gastroesophageal reflux disease), History of radiation therapy (03/22/12 - 04/09/12), HLD (hyperlipidemia), LBBB (left bundle branch block) (dx'd 05/2014), Non Hodgkin's lymphoma ([place]), Osteoarthritis of right knee, and Osteopenia.   Surgical History:   Past Surgical History:  Procedure  Laterality Date  . ABDOMINAL HYSTERECTOMY  1988   "partial; for fibroids"  . BASAL CELL CARCINOMA EXCISION Right ~ 2010   "leg"  . BREAST BIOPSY Left 12/03   Negative  . COLONOSCOPY  5/06   Ext. hemms  . DILATION AND CURETTAGE OF UTERUS  1987  . PARTIAL KNEE ARTHROPLASTY Right 06/08/2018   Procedure: UNICOMPARTMENTAL RIGHT KNEE;  Surgeon: Renette Butters, MD;  Location: [facility];  Service: Orthopedics;  Laterality: Right;  . REPLACEMENT UNICONDYLAR JOINT KNEE Right 06/08/2018  . TONSILLECTOMY  11/07/2011   Procedure: TONSILLECTOMY;  Surgeon: Rozetta Nunnery, MD;  Location: [facility];  Service: ENT;  Laterality: N/A;  . TONSILLECTOMY  10/2011   "right was cancerous"  . TUBAL LIGATION  1987     Social History:   reports that she quit smoking about 46 years ago. Her smoking use included cigarettes. She has a 2.50 pack-year smoking history. She has never used smokeless tobacco. She reports previous alcohol use. She reports that she does not use drugs.   Family History:  Her family history includes Breast cancer in her paternal aunt; Cancer in her paternal aunt and paternal aunt; Heart attack (age of onset: 75) in her father; Heart disease in her father; Hyperlipidemia in an other family member; Stroke (age of onset: 61) in her mother. There is no history of Colon cancer or Colon polyps.   Allergies Allergies  Allergen Reactions  . Pnu-Imune [Pneumococcal Polysaccharide Vaccine] Hives     Home Medications  Prior to Admission medications   Medication Sig Start Date End Date Taking? Authorizing Provider  aspirin EC 81 MG tablet Take 1 tablet (81 mg total) by mouth 2 (two) times daily. For DVT prophylaxis 06/08/18  Yes Prudencio Burly III, PA-C  atorvastatin (LIPITOR) 80 MG tablet TAKE 1 TABLET BY MOUTH ONCE A DAY AT [place] Patient taking differently: Take 80 mg by mouth every evening. 11/27/20  Yes Tower, Wynelle Fanny, MD  busPIRone (BUSPAR) 15 MG tablet  TAKE HALF TABLET (7.5 MG TOTAL) BY MOUTHTWICE DAILY Patient taking differently: Take 7.5 mg by mouth 2 (two) times daily. 11/27/20  Yes Tower, Wynelle Fanny, MD  Calcium Citrate-Vitamin D (CALCIUM + D PO) Take 1 tablet by mouth at bedtime.   Yes [provider]  donepezil (ARICEPT) 10 MG tablet Take 1 tablet (10 mg total) by mouth at bedtime. 11/27/20  Yes Tower, Wynelle Fanny, MD  Multiple Vitamin (MULTIVITAMIN WITH MINERALS) TABS tablet Take 1 tablet by mouth every evening.   Yes  [provider]  omeprazole (PRILOSEC) 20 MG capsule TAKE 1 CAPSULE BY MOUTH DAILY AS NEEDED FOR HEARTBURN Patient taking differently: Take 20 mg by mouth daily as needed (heartburn). 11/27/20  Yes Tower, Wynelle Fanny, MD     Critical care time: 85 min     This patient is critically ill with acute hypoxic respiratory failure; which, requires frequent high complexity decision making, assessment, support, evaluation, and titration of therapies. This was completed through the application of advanced monitoring technologies and extensive interpretation of multiple databases. During this encounter critical care time was devoted to patient care services described in this note for 85 minutes.

## 2020-12-29 NOTE — ED Notes (Signed)
PT RR > 40, pt desat to 80's. This nurse placed NRB, pt 02 remained in 80's. [place] placed as well as NRB. PT 02 increased to upper 80's. RT called, EDP [place] notified. [place] paged.

## 2020-12-29 NOTE — ED Notes (Signed)
Pt now on 7 L [place], 15 L NRB

## 2020-12-29 NOTE — Progress Notes (Addendum)
Pharmacy Antibiotic Note  Tara Sloan is a 75 y.o. female admitted on 01-11-21 with AMS, currently covering for meningitis.  Pharmacy has been consulted for vancomycin and ampicillin dosing.  Already started on acyclovir.  Plan: Vancomycin 1000mg  x1 then 750mg  Q24H. Ampicillin 2g IV Q6H (adjusted for renal function).  Weight: 53.7 kg (118 lb 6.2 oz)  Temp (24hrs), Avg:101.2 F (38.4 C), Min:100.1 F (37.8 C), Max:102.2 F (39 C)  Recent Labs  Lab 01-11-21 2214 2021-01-11 2219  WBC  --  15.1*  CREATININE 0.80 0.87    Estimated Creatinine Clearance: 45.2 mL/min (by C-G formula based on SCr of 0.87 mg/dL).    Allergies  Allergen Reactions  . Pnu-Imune [Pneumococcal Polysaccharide Vaccine] Hives     Thank you for allowing pharmacy to be a part of this patient's care.  Wynona Neat, PharmD, BCPS  12/29/2020 2:10 AM

## 2020-12-29 NOTE — Progress Notes (Signed)
On high flow [place], sats acceptable. Able to maintain airway currently, EEG in progress. CSF reviewed only 9 WBCs, discussed with neurology, will DC bacterial meningitic coverage and changed to Unasyn for aspiration pneumonia, will continue acyclovir until HSV PCR back  Nazier Neyhart V. Elsworth Soho MD

## 2020-12-29 NOTE — Progress Notes (Signed)
So she was not following a NEUROLOGY CONSULTATION PROGRESS NOTE   Date of service: December 29, 2020 Patient Name: Tara Sloan MRN:  [id number] DOB:  1946/12/17  Brief HPI  Tara Sloan is a 75 y.o. 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 who presents with  Altered mental status   Interval Hx  Pt is still somnolent but arousable on sternal rub. Pt is not oriented to self, place or time. Unable to follow commands. Moves all four extremities and opens eyes occassionally. Pt is also found to be COVID positive. CT head reveals some edema in Temporal lobe. MRI revealed abnormal T2/FLAIR signal intensity involving the left insula and subinsular region, with extension to involve the mesial and anterior left temporal lobe, including the left hippocampus, Similar but less pronounced changes involve the right temporal lobe and insular cortex. Labs showed high inflammatory markers, urine analysis showed >50 WBCs with 21-50 RBCs. CSF revealed increased WBC of 9 and high RBC count of 27, lymphocytes normal at 74. CSF and blood cultures pending. CSF sent for HSV cultures, cryptococcal antigen, VZV PCR, fungal culture.  EEG showed 1 seizure without clinical signs at 1029, rising from left central-parietal region, lasted about 13 seconds.  Additionally there is moderate diffuse encephalopathy likely related to seizure, underlying structural abnormality.  Patient was started on acyclovir, ceftriaxone, vancomycin. Ceftriaxone and vancomycin stopped  and Unasyn was started. Patient's symptoms are most likely due to  encephalitis. We'll  also consider encephalopathy, Seizure, COVID infection as differentials.  Vitals   Vitals:   12/29/20 1200 12/29/20 1300 12/29/20 1400 12/29/20 1500  BP: 134/73 (!) 129/57 134/62 122/70  Pulse: (!) 104 100 99 97  Resp: (!) 33 (!) 33 (!) 28 (!) 25  Temp: (!) 101.1 F (38.4 C)  (!) 101 F (38.3 C)   TempSrc: Axillary  Axillary   SpO2: 93% 95% 93% 92%  Weight:      Height:         Body mass index is 20.09 kg/m.  Physical Exam   General: Laying in bed; Pt is in acute distress. Somnolent and not oriented to self, place or time. Unable to follow commands. Moving UE  and LE and opens eyes occassionally. HENT: Unable to examine. Normal external appearance of ears and nose.  Neck: Supple, no pain or tenderness CV: No JVD. No peripheral edema.  Pulmonary: Symmetric Chest rise. Normal respiratory effort.  Abdomen: Non distended. Ext: No cyanosis, edema, or deformity  Skin: No rash. Normal palpation of skin.  Musculoskeletal: Normal digits and nails by inspection. No clubbing.   Neurologic Examination  Mental status/Cognition: Somnolent but arosable on sternal rub. Pt is not oriented to self, place or time. Unable to follow commands. Moving UE and LE spontaneously and opens eyes occasionally.Marland Kitchen Speech/language: Doesn't respond. Cranial nerves:   CN II Pupils equal and reactive to light   CN III,IV,VI EOM- Unable to test   CN V Unable to test   CN VII no asymmetry, no nasolabial fold flattening    CN VIII Pt opens eyes to voice   CN IX & X Unable to test as Pt is not responding   CN  XI Unable to test as Pt is not responding   CN XII Unable to test as Pt is not responding   Motor:  Muscle bulk: normal, tone normal Unable to assess strength as Pt is somnolent. Pt is seen spontaneously moving UE and LE's  Sensation: Not able to assess as Pt is somnolent.  Coordination/Complex Motor:  Pt unable to follow commands. - Gait: Not tested.  Labs   Basic  Metabolic Panel:  Lab Results  Component Value Date   NA 140 01/02/2021   K 3.5 01/09/2021   CO2 24 01/05/2021   GLUCOSE 140 (H) 01/16/2021   BUN 38 (H) 01/16/2021   CREATININE 0.87 01/10/2021   CALCIUM 8.7 (L) 12/25/2020   GFRNONAA >60 01/06/2021   GFRAA >60 05/28/2018   HbA1c:  Lab Results  Component Value Date   HGBA1C 5.9 11/20/2020   LDL:  Lab Results  Component Value Date   LDLCALC 54 11/20/2020   Urine Drug Screen:     Component Value Date/Time   LABOPIA NONE DETECTED 12/29/2020 1459   COCAINSCRNUR NONE DETECTED 12/29/2020 1459   LABBENZ NONE DETECTED 12/29/2020 1459   AMPHETMU NONE DETECTED 12/29/2020 1459   THCU NONE DETECTED 12/29/2020 1459   LABBARB NONE DETECTED 12/29/2020 1459    Alcohol Level No results found for: ETH No results found for: PHENYTOIN, ZONISAMIDE, LAMOTRIGINE, LEVETIRACETA No results found for: PHENYTOIN, PHENOBARB, VALPROATE, CBMZ  Imaging and Diagnostic studies  Results for orders placed during the hospital encounter of 01/12/2021  CT Angio Neck W and/or Wo Contrast  Narrative CLINICAL DATA:  Initial evaluation for acute stroke, aphasia.  EXAM: CT ANGIOGRAPHY HEAD AND NECK  CT PERFUSION BRAIN  TECHNIQUE: Multidetector CT imaging of the head and neck was performed using the standard protocol during bolus administration of intravenous contrast. Multiplanar CT image reconstructions and MIPs were obtained to evaluate the vascular anatomy. Carotid stenosis measurements (when applicable) are obtained utilizing NASCET criteria, using the distal internal carotid diameter as the denominator.  Multiphase CT imaging of the brain was performed following IV bolus contrast injection. Subsequent parametric perfusion maps were calculated using RAPID software.  CONTRAST:  159mL OMNIPAQUE IOHEXOL 350 MG/ML SOLN  COMPARISON:  Comparison made with prior head CT from earlier the same day.  FINDINGS: CTA NECK FINDINGS  Aortic arch:  Visualized aortic arch of normal caliber with normal 3 vessel morphology. Mild atheromatous change about the arch and origin of the great vessels. Eccentric calcified plaque at the proximal left subclavian artery with short-segment stenosis of up to 70% (series 7, image 305). No other hemodynamically significant stenosis about the proximal great vessels.  Right carotid system: Right CCA patent from its origin to the bifurcation without stenosis. Eccentric calcified plaque at the right bifurcation/proximal right ICA with associated stenosis of up to 50% by NASCET criteria. Right ICA otherwise widely patent distally without stenosis, dissection or occlusion.  Left carotid system: Left CCA patent from its origin to the bifurcation without stenosis. Eccentric calcified plaque at the left bifurcation with associated stenosis of up to 40% by NASCET criteria. Left ICA otherwise widely patent distally to the skull base without stenosis, dissection or occlusion.  Vertebral arteries: Both vertebral arteries arise from subclavian arteries. Vertebral arteries patent within the neck without significant stenosis, dissection or occlusion.  Skeleton: No visible acute osseous abnormality. Advance degenerative spondylosis at C3-4 through C6-7, with progressive 4 mm anterolisthesis of C3 on C4. Heterotopic calcification adjacent to the right C3-4 facet suspected to reflect post treatment  change.  Other neck: No other acute soft tissue abnormality within the neck. No visible mass or adenopathy.  Upper chest: Mild layering secretions noted within the subglottic trachea. Visualized upper chest demonstrates no other acute finding.  Review of the MIP images confirms the above findings  CTA HEAD FINDINGS  Anterior circulation: Petrous segments widely patent bilaterally. Mild atheromatous change within the carotid siphons without hemodynamically significant stenosis. A1 segments patent bilaterally.  Normal anterior communicating artery complex. Anterior cerebral arteries widely patent to their distal aspects. No M1 stenosis or occlusion. Normal MCA bifurcations. Distal MCA branches well perfused and symmetric.  Posterior circulation: Vertebral arteries widely patent to the vertebrobasilar junction without stenosis. Both PICA origins patent and normal. Basilar widely patent to its distal aspect. Superior cerebellar arteries patent bilaterally. Both PCAs primarily supplied via the basilar well perfused to their distal aspects.  Venous sinuses: Patent.  Anatomic variants: None significant.  No aneurysm.  Review of the MIP images confirms the above findings  CT Brain Perfusion Findings:  ASPECTS: Not calculated.  CBF (<30%) Volume: 73mL  Perfusion (Tmax>6.0s) volume: 24mL  Mismatch Volume: 53mL  Infarction Location:Negative CT perfusion with no evidence for acute ischemia. There is question of elevated cerebral blood flow and cerebral blood volume within the left insular/subinsular region on dedicated perfusion maps, corresponding with signal change on prior noncontrast CT. Finding is indeterminate, but could reflect changes of acute seizure or possibly infection/encephalitis.  IMPRESSION: CTA HEAD AND NECK IMPRESSION:  1. Negative CTA for emergent large vessel occlusion. 2. Atheromatous plaque about the carotid bifurcations with estimated stenoses of up to 50% on the right and 40% on the left. 3. Scattered secretions within the subglottic airway. Finding suggests that this patient may be at risk for aspiration.  CT PERFUSION IMPRESSION:  1. Negative CT perfusion for acute ischemia. 2. Question elevated cerebral blood flow and cerebral blood volume within the left insular/subinsular region, corresponding with signal change on prior noncontrast head CT. Finding is nonspecific, but can be seen in the setting of seizure or possibly acute encephalitis/infection. Possible  herpes infection could be considered given location. Correlation with LP and CSF values as well as EEG recommended as warranted. Additionally, further evaluation with dedicated brain MRI, with and without contrast, recommended for further evaluation.  Case discussed by telephone at the time of interpretation on 01/07/2021 at 11:48 pm with provider Dr. Leonel Ramsay.   Electronically Signed By: Jeannine Boga M.D. On: 01/03/2021 23:57   Impression   Tara Sloan is a 75 y.o. female with PMH significant for diffuse large B-cell lymphoma in remission (2012), CHF. Her examination- patient is still somnolent and not oriented to self, place or time. Unable to follow commands. Moving UE's and opens eyes occassionally. She is COVID positive. CT head reveals some edema in Temporal lobe. MRI revealed abnormal T2/FLAIR signal intensity involving the left insula and subinsular region, with extension to left temporal lobe and left hippocampus, less pronounced changes involve the right temporal lobe and insular cortex.  Urine analysis showed high WBCs, CSF revealed increased wbc's of 9, high RBC count of 27. CSF and blood cultures pending.  EEG showed 1 seizure rising from left central-parietal region, lasted about 13 seconds. Additionally there is moderate diffuse encephalopathy likely related to seizure, underlying structural abnormality. Patient's symptoms are most likely due to Encephalitis. We'll  also consider encephalopathy, Seizure, COVID infection as differentials. At this point we will continue acyclovir and Keppra. F/u on CSF and Blood Cultures. Will get  Repeat MRI tomorrow morning to see worsening and order continuous EEG. Neurology will continue to follow.  Recommendations  - Frequent neuro checks. - F/u CSF HSV cultures, cryptococcal antigen, VZV PCR, fungal culture. - F/u Urine Culture and Blood C/s -Continue Acyclovir until CSF cultures rules out Herpes Encephalitis.  -HSV IgG and  IgM ordered. -Repeat MRI W and WO contrast tomorrow morning. -Continuous EEG.  -Continue Keppra  -Seizure precautions. -We'll continue to follow.  ________________________________________________________________   Thank you for the opportunity to take part in the care of this patient. If you have any further questions, please contact the neurology consultation attending.  Signed,  Dr. Armando Reichert MD PGY1 [facility] Neurology

## 2020-12-29 NOTE — Consult Note (Signed)
Neurology Consultation Reason for Consult: Altered mental status Referring Physician: Ronnald Nian, a  CC: Altered mental status  History is obtained from: Patient's son  HPI: Tara Sloan is a 75 y.o. female who was in her normal state of health until yesterday when they started noticing that she was slurring her speech her speech progressed and then today she was having difficulty with actually getting her words out as well.  She was brought to the emergency department where she was noted to be aphasic.  Her son states that when she first got here she was still able to smile and do other things on command, but seems to continue to worsen.  CT head reveals some edema in the temporal lobe.   LKW: 1/6   ROS:  Unable to obtain due to altered mental status.   Past Medical History:  Diagnosis Date  . Allergic rhinitis   . Arthritis    "hands; probably qwhere" (03/12/2015)  . Basal cell carcinoma ~ 2010   "leg"  . Chronic back pain   . Diffuse large B cell lymphoma ([place]) 11/07/11   dx from tonsilar biopsy  . External hemorrhoid   . GERD (gastroesophageal reflux disease)   . History of radiation therapy 03/22/12 - 04/09/12   right oropharynx/neck  . HLD (hyperlipidemia)   . LBBB (left bundle branch block) dx'd 05/2014  . Non Hodgkin's lymphoma ([place])   . Osteoarthritis of right knee   . Osteopenia      Family History  Problem Relation Age of Onset  . Heart attack Father 6  . Heart disease Father   . Stroke Mother 22  . Breast cancer Paternal Aunt   . Cancer Paternal Aunt   . Cancer Paternal Aunt   . Hyperlipidemia Other        Family history  . Colon cancer Neg Hx   . Colon polyps Neg Hx      Social History:  reports that she quit smoking about 46 years ago. Her smoking use included cigarettes. She has a 2.50 pack-year smoking history. She has never used smokeless tobacco. She reports previous alcohol use. She reports that she does not use drugs.   Exam: Current vital  signs: BP (!) 156/74 (BP Location: Right Arm)   Pulse 89   Temp (!) 102.2 F (39 C) (Oral)   Resp 20   Wt 53.7 kg   SpO2 97%   BMI 21.31 kg/m  Vital signs in last 24 hours: Temp:  [100.1 F (37.8 C)-102.2 F (39 C)] 102.2 F (39 C) (01/07 2249) Pulse Rate:  [84-89] 89 (01/07 2249) Resp:  [20-21] 20 (01/07 2249) BP: (156-161)/(73-82) 156/74 (01/07 2249) SpO2:  [96 %-97 %] 97 % (01/07 2249) Weight:  [53.7 kg] 53.7 kg (01/07 2300)   Physical Exam  Constitutional: Appears well-developed and well-nourished.  Psych: Affect appropriate to situation Eyes: No scleral injection HENT: No OP obstrucion MSK: no joint deformities.  Cardiovascular: Normal rate and regular rhythm.  Respiratory: Effort normal, non-labored breathing GI: Soft.  No distension. There is no tenderness.  Skin: WDI  Neuro: Mental Status: Patient is awake, alert, oriented to person, place, month, year, and situation. Patient is able to give a clear and coherent history. No signs of aphasia or neglect Cranial Nerves: II: Visual Fields are full. Pupils are equal, round, and reactive to light.   III,IV, VI: EOMI without ptosis or diploplia.  V: She responds to stimulation on both sides of the face VII: Facial movement  is mildly diminished nasolabial fold on the right VIII: hearing is intact to voice X: Uvula elevates symmetrically XI: Shoulder shrug is symmetric. XII: tongue is midline without atrophy or fasciculations.  Motor: Tone is normal. Bulk is normal. 5/5 strength was present on the left, on the right she has decreased movement of both the right arm and leg, but does have some movement at least 4/5 in the leg and 3/5 in the arm, difficult to be certain due to lack of participation Sensory: She responds to noxious stimulation in all four extremities Cerebellar: She does not perform     I have reviewed labs in epic and the results pertinent to this consultation are: WBC 15.1   I have reviewed  the images obtained: CT head-hypodensity in the left temporal lobe, CTA/P-no findings to suggest ischemia  Impression: 75 year old female with progressive aphasia and right-sided weakness consistent with a left hemispheric syndrome, without evidence of ischemia on CT A/P.  Given that she is continue to progress despite no clear etiology, I am concerned with her fever and white count that this might be an infectious process such as herpes.  She has been started on acyclovir by the ED physician and I feel that lumbar puncture is likely indicated.  Less likely, this could be stroke with aspiration causing fever, but with a completely negative CT A/P I think this is less likely.  Recommendations: 1) MRI brain 2) LP for cells, glucose, protein, hsv, culture 3) Empiric acyclovir, could also consider empiric meningitic coverage, though I think this is less likely given lack of meningeal signs. 4) will continue to follow.    Roland Rack, MD [facility] [phone]  If 7pm- 7am, please page neurology on call as listed in [place].

## 2020-12-29 NOTE — ED Notes (Signed)
Lab to add Ferritin , trop C-reactive protein Lactate dehydrogenase  Triglycerides  Procalcitonin

## 2020-12-29 NOTE — Plan of Care (Signed)
  Problem: Respiratory: Goal: Will maintain a patent airway Outcome: Progressing    Patient arrived to unit. Currently on 15L HFNC, spo2 90-95% Intermittent tachypnea RR 25-30. HOB elevated, suctioned PRN.

## 2020-12-29 NOTE — Progress Notes (Signed)
Received phone call from neurologist regarding Varicella PCR from CSF.  Per lab, fluid to be sent out to [facility].

## 2020-12-29 NOTE — H&P (Signed)
History and Physical    CHENILLE PARDE [id number] DOB: 26-Mar-1946 DOA: 01/18/2021  PCP: Abner Greenspan, MD Patient coming from: Home  Chief Complaint: Aphasia  HPI: DILANA MUKHERJEE is a 75 y.o. female with medical history significant of hyperlipidemia, GERD, depression, prediabetes, history of B-cell lymphoma in 2012 presented to ED via EMS for evaluation of aphasia and confusion.  Last known well at 0900 on 1/6.  Patient is obtunded.  No history could be obtained from her.  Son at bedside states patient was in her usual state of health on Wednesday and babysitting his child.  She did seem a little forgetful that day but was otherwise at her baseline.  States up until yesterday morning 9 AM she appeared normal to family but when son checked on her in the evening at 6 PM patient appeared confused and was not able to speak.  She was mumbling and was not able to get any words out.  She did eat something prior to coming into the ED and son states in the emergency room he has noticed that she is making gurgling sounds.  Son states patient is fully vaccinated against Covid including booster shot.  ED Course: Febrile with temperature 102.2 F.  Slightly tachycardic and tachypneic.  Not hypotensive.  Patient was initially not hypoxic and chest x-ray was negative for acute abnormality.  Later her sats dropped to the 80s and she was placed on HFNC/nonrebreather.  SARS-CoV-2 PCR test came back positive.  Influenza panel negative.  WBC 15.1, hemoglobin 14.2, platelet count 225K.  Sodium 140, potassium 3.5, chloride 104, bicarb 24, BUN 38, creatinine 0.8, glucose 140.  UA and urine culture pending.  UDS pending.  INR 1.1.  Blood culture x2 pending.  Lactic acid level pending.  Head CT showing indeterminate vague hypodensity within the left insular region.  CTA head and neck negative for LVO.  Did show scattered secretions within subglottic airway, findings suggesting that patient may be at risk for  aspiration.  CT cerebral perfusion study negative for acute ischemia.  Brain MRI showing findings concerning for possible acute encephalitis with herpes encephalitis a high consideration given the location of abnormal findings.  Neurology consulted.  Patient underwent LP in the ED.  Patient was given Tylenol, Ativan, vancomycin, ceftriaxone, ampicillin, acyclovir, and a 500 cc normal saline bolus.  Review of Systems:  All systems reviewed and apart from history of presenting illness, are negative.  Past Medical History:  Diagnosis Date  . Allergic rhinitis   . Arthritis    "hands; probably qwhere" (03/12/2015)  . Basal cell carcinoma ~ 2010   "leg"  . Chronic back pain   . 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 External hemorrhoid   . GERD (gastroesophageal reflux disease)   . History of radiation therapy 03/22/12 - 04/09/12   right oropharynx/neck  . HLD (hyperlipidemia)   . LBBB (left bundle branch block) dx'd 05/2014  . Non Hodgkin's lymphoma ([place])   . Osteoarthritis of right knee   . Osteopenia     Past Surgical History:  Procedure Laterality Date  . ABDOMINAL HYSTERECTOMY  1988   "partial; for fibroids"  . BASAL CELL CARCINOMA EXCISION Right ~ 2010   "leg"  . BREAST BIOPSY Left 12/03   Negative  . COLONOSCOPY  5/06   Ext. hemms  . DILATION AND CURETTAGE OF UTERUS  1987  . PARTIAL KNEE ARTHROPLASTY Right 06/08/2018   Procedure: UNICOMPARTMENTAL RIGHT  KNEE;  Surgeon: Sheral Apley, MD;  Location: [facility] OR;  Service: Orthopedics;  Laterality: Right;  . REPLACEMENT UNICONDYLAR JOINT KNEE Right 06/08/2018  . TONSILLECTOMY  11/07/2011   Procedure: TONSILLECTOMY;  Surgeon: Drema Halon, MD;  Location: [facility];  Service: ENT;  Laterality: N/A;  . TONSILLECTOMY  10/2011   "right was cancerous"  . TUBAL LIGATION  1987     reports that she quit smoking about 46 years ago. Her smoking use included cigarettes. She  has a 2.50 pack-year smoking history. She has never used smokeless tobacco. She reports previous alcohol use. She reports that she does not use drugs.  Allergies  Allergen Reactions  . Pnu-Imune [Pneumococcal Polysaccharide Vaccine] Hives    Family History  Problem Relation Age of Onset  . Heart attack Father 15  . Heart disease Father   . Stroke Mother 85  . Breast cancer Paternal Aunt   . Cancer Paternal Aunt   . Cancer Paternal Aunt   . Hyperlipidemia Other        Family history  . Colon cancer Neg Hx   . Colon polyps Neg Hx     Prior to Admission medications   Medication Sig Start Date End Date Taking? Authorizing Provider  aspirin EC 81 MG tablet Take 1 tablet (81 mg total) by mouth 2 (two) times daily. For DVT prophylaxis 06/08/18  Yes Albina Billet III, PA-C  atorvastatin (LIPITOR) 80 MG tablet TAKE 1 TABLET BY MOUTH ONCE A DAY AT [facility] THE EVENING Patient taking differently: Take 80 mg by mouth every evening. 11/27/20  Yes Tower, Audrie Gallus, MD  busPIRone (BUSPAR) 15 MG tablet TAKE HALF TABLET (7.5 MG TOTAL) BY MOUTHTWICE DAILY Patient taking differently: Take 7.5 mg by mouth 2 (two) times daily. 11/27/20  Yes Tower, Audrie Gallus, MD  Calcium Citrate-Vitamin D (CALCIUM + D PO) Take 1 tablet by mouth at bedtime.   Yes [provider]  donepezil (ARICEPT) 10 MG tablet Take 1 tablet (10 mg total) by mouth at bedtime. 11/27/20  Yes Tower, Audrie Gallus, MD  Multiple Vitamin (MULTIVITAMIN WITH MINERALS) TABS tablet Take 1 tablet by mouth every evening.   Yes [provider]  omeprazole (PRILOSEC) 20 MG capsule TAKE 1 CAPSULE BY MOUTH DAILY AS NEEDED FOR HEARTBURN Patient taking differently: Take 20 mg by mouth daily as needed (heartburn). 11/27/20  Yes Tower, Audrie Gallus, MD    Physical Exam: Vitals:   12/29/20 0115 12/29/20 0200 12/29/20 0409 12/29/20 0500  BP: (!) 162/75 (!) 167/91  138/68  Pulse: 90 (!) 101  96  Resp: (!) 25 (!) 22  (!) 35  Temp:      TempSrc:       SpO2: 90% 94% 100% 98%  Weight:        Physical Exam Constitutional:      Appearance: She is not diaphoretic.  HENT:     Head: Normocephalic and atraumatic.  Eyes:     Pupils: Pupils are equal, round, and reactive to light.  Cardiovascular:     Rate and Rhythm: Normal rate and regular rhythm.     Pulses: Normal pulses.  Pulmonary:     Breath sounds: No wheezing.     Comments: Tachypneic with respiratory rate up to 30 Coarse breath sounds bilaterally Satting in the upper 90s on 8 L HFNC/NRB Abdominal:     General: Bowel sounds are normal. There is no distension.     Palpations: Abdomen is soft.  Tenderness: There is no abdominal tenderness.  Musculoskeletal:        General: No swelling or tenderness.     Cervical back: Normal range of motion and neck supple.  Skin:    General: Skin is warm and dry.  Neurological:     General: No focal deficit present.     Mental Status: She is oriented to person, place, and time.     Comments: Obtunded, minimally responsive to painful stimuli     Labs on Admission: I have personally reviewed following labs and imaging studies  CBC: Recent Labs  Lab 12/31/2020 2214 01/21/2021 2219  WBC  --  15.1*  NEUTROABS  --  12.6*  HGB 14.3 14.2  HCT 42.0 44.1  MCV  --  92.1  PLT  --  123456   Basic Metabolic Panel: Recent Labs  Lab 12/31/2020 2214 01/06/2021 2219  NA 143 140  K 3.4* 3.5  CL 104 104  CO2  --  24  GLUCOSE 135* 140*  BUN 39* 38*  CREATININE 0.80 0.87  CALCIUM  --  8.7*   GFR: Estimated Creatinine Clearance: 45.2 mL/min (by C-G formula based on SCr of 0.87 mg/dL). Liver Function Tests: Recent Labs  Lab 01/15/2021 2219  AST 30  ALT 21  ALKPHOS 50  BILITOT 0.8  PROT 7.0  ALBUMIN 3.7   No results for input(s): LIPASE, AMYLASE in the last 168 hours. No results for input(s): AMMONIA in the last 168 hours. Coagulation Profile: Recent Labs  Lab 12/27/2020 2219  INR 1.1   Cardiac Enzymes: No results for input(s):  CKTOTAL, CKMB, CKMBINDEX, TROPONINI in the last 168 hours. BNP (last 3 results) No results for input(s): PROBNP in the last 8760 hours. HbA1C: No results for input(s): HGBA1C in the last 72 hours. CBG: No results for input(s): GLUCAP in the last 168 hours. Lipid Profile: No results for input(s): CHOL, HDL, LDLCALC, TRIG, CHOLHDL, LDLDIRECT in the last 72 hours. Thyroid Function Tests: No results for input(s): TSH, T4TOTAL, FREET4, T3FREE, THYROIDAB in the last 72 hours. Anemia Panel: No results for input(s): VITAMINB12, FOLATE, FERRITIN, TIBC, IRON, RETICCTPCT in the last 72 hours. Urine analysis:    Component Value Date/Time   COLORURINE yellow 03/29/2008 1523   APPEARANCEUR Clear 03/29/2008 1523   LABSPEC <1.005 03/29/2008 1523   PHURINE 7.0 03/29/2008 1523   HGBUR negative 03/29/2008 1523   BILIRUBINUR Negative 10/31/2016 0948   PROTEINUR Negative 10/31/2016 0948   UROBILINOGEN 0.2 10/31/2016 0948   UROBILINOGEN 0.2 03/29/2008 1523   NITRITE Negative 10/31/2016 0948   NITRITE negative 03/29/2008 1523   LEUKOCYTESUR Negative 10/31/2016 0948    Radiological Exams on Admission: CT Angio Head W or Wo Contrast  Result Date: 01/12/2021 CLINICAL DATA:  Initial evaluation for acute stroke, aphasia. EXAM: CT ANGIOGRAPHY HEAD AND NECK CT PERFUSION BRAIN TECHNIQUE: Multidetector CT imaging of the head and neck was performed using the standard protocol during bolus administration of intravenous contrast. Multiplanar CT image reconstructions and MIPs were obtained to evaluate the vascular anatomy. Carotid stenosis measurements (when applicable) are obtained utilizing NASCET criteria, using the distal internal carotid diameter as the denominator. Multiphase CT imaging of the brain was performed following IV bolus contrast injection. Subsequent parametric perfusion maps were calculated using RAPID software. CONTRAST:  173mL OMNIPAQUE IOHEXOL 350 MG/ML SOLN COMPARISON:  Comparison made with prior  head CT from earlier the same day. FINDINGS: CTA NECK FINDINGS Aortic arch: Visualized aortic arch of normal caliber with normal 3 vessel morphology.  Mild atheromatous change about the arch and origin of the great vessels. Eccentric calcified plaque at the proximal left subclavian artery with short-segment stenosis of up to 70% (series 7, image 305). No other hemodynamically significant stenosis about the proximal great vessels. Right carotid system: Right CCA patent from its origin to the bifurcation without stenosis. Eccentric calcified plaque at the right bifurcation/proximal right ICA with associated stenosis of up to 50% by NASCET criteria. Right ICA otherwise widely patent distally without stenosis, dissection or occlusion. Left carotid system: Left CCA patent from its origin to the bifurcation without stenosis. Eccentric calcified plaque at the left bifurcation with associated stenosis of up to 40% by NASCET criteria. Left ICA otherwise widely patent distally to the skull base without stenosis, dissection or occlusion. Vertebral arteries: Both vertebral arteries arise from subclavian arteries. Vertebral arteries patent within the neck without significant stenosis, dissection or occlusion. Skeleton: No visible acute osseous abnormality. Advance degenerative spondylosis at C3-4 through C6-7, with progressive 4 mm anterolisthesis of C3 on C4. Heterotopic calcification adjacent to the right C3-4 facet suspected to reflect post treatment change. Other neck: No other acute soft tissue abnormality within the neck. No visible mass or adenopathy. Upper chest: Mild layering secretions noted within the subglottic trachea. Visualized upper chest demonstrates no other acute finding. Review of the MIP images confirms the above findings CTA HEAD FINDINGS Anterior circulation: Petrous segments widely patent bilaterally. Mild atheromatous change within the carotid siphons without hemodynamically significant stenosis. A1  segments patent bilaterally. Normal anterior communicating artery complex. Anterior cerebral arteries widely patent to their distal aspects. No M1 stenosis or occlusion. Normal MCA bifurcations. Distal MCA branches well perfused and symmetric. Posterior circulation: Vertebral arteries widely patent to the vertebrobasilar junction without stenosis. Both PICA origins patent and normal. Basilar widely patent to its distal aspect. Superior cerebellar arteries patent bilaterally. Both PCAs primarily supplied via the basilar well perfused to their distal aspects. Venous sinuses: Patent. Anatomic variants: None significant.  No aneurysm. Review of the MIP images confirms the above findings CT Brain Perfusion Findings: ASPECTS: Not calculated. CBF (<30%) Volume: 46mL Perfusion (Tmax>6.0s) volume: 50mL Mismatch Volume: 59mL Infarction Location:Negative CT perfusion with no evidence for acute ischemia. There is question of elevated cerebral blood flow and cerebral blood volume within the left insular/subinsular region on dedicated perfusion maps, corresponding with signal change on prior noncontrast CT. Finding is indeterminate, but could reflect changes of acute seizure or possibly infection/encephalitis. IMPRESSION: CTA HEAD AND NECK IMPRESSION: 1. Negative CTA for emergent large vessel occlusion. 2. Atheromatous plaque about the carotid bifurcations with estimated stenoses of up to 50% on the right and 40% on the left. 3. Scattered secretions within the subglottic airway. Finding suggests that this patient may be at risk for aspiration. CT PERFUSION IMPRESSION: 1. Negative CT perfusion for acute ischemia. 2. Question elevated cerebral blood flow and cerebral blood volume within the left insular/subinsular region, corresponding with signal change on prior noncontrast head CT. Finding is nonspecific, but can be seen in the setting of seizure or possibly acute encephalitis/infection. Possible herpes infection could be considered  given location. Correlation with LP and CSF values as well as EEG recommended as warranted. Additionally, further evaluation with dedicated brain MRI, with and without contrast, recommended for further evaluation. Case discussed by telephone at the time of interpretation on 01/14/2021 at 11:48 pm with provider Dr. Leonel Ramsay. Electronically Signed   By: Jeannine Boga M.D.   On: 01/12/2021 23:57   CT Head Wo Contrast  Result  Date: 01/12/2021 CLINICAL DATA:  Aphasia. EXAM: CT HEAD WITHOUT CONTRAST TECHNIQUE: Contiguous axial images were obtained from the base of the skull through the vertex without intravenous contrast. COMPARISON:  None. FINDINGS: Brain: Indeterminate vague hypodensity within the left insular region (4:14). No evidence of large-territorial acute infarction. No parenchymal hemorrhage. No mass lesion. No extra-axial collection. No mass effect or midline shift. No hydrocephalus. Basilar cisterns are patent. Vascular: No hyperdense vessel. Skull: No acute fracture or focal lesion. Sinuses/Orbits: Paranasal sinuses and mastoid air cells are clear. The orbits are unremarkable. Other: None. IMPRESSION: 1. Indeterminate vague hypodensity within the left insular region. If high clinical suspicion for acute infarction, consider MRI brain noncontrast for further evaluation. 2. Otherwise no large territorial acute infarction or acute intracranial hemorrhage. Electronically Signed   By: Iven Finn M.D.   On: 01/21/2021 21:00   CT Angio Neck W and/or Wo Contrast  Result Date: 12/26/2020 CLINICAL DATA:  Initial evaluation for acute stroke, aphasia. EXAM: CT ANGIOGRAPHY HEAD AND NECK CT PERFUSION BRAIN TECHNIQUE: Multidetector CT imaging of the head and neck was performed using the standard protocol during bolus administration of intravenous contrast. Multiplanar CT image reconstructions and MIPs were obtained to evaluate the vascular anatomy. Carotid stenosis measurements (when applicable) are  obtained utilizing NASCET criteria, using the distal internal carotid diameter as the denominator. Multiphase CT imaging of the brain was performed following IV bolus contrast injection. Subsequent parametric perfusion maps were calculated using RAPID software. CONTRAST:  199mL OMNIPAQUE IOHEXOL 350 MG/ML SOLN COMPARISON:  Comparison made with prior head CT from earlier the same day. FINDINGS: CTA NECK FINDINGS Aortic arch: Visualized aortic arch of normal caliber with normal 3 vessel morphology. Mild atheromatous change about the arch and origin of the great vessels. Eccentric calcified plaque at the proximal left subclavian artery with short-segment stenosis of up to 70% (series 7, image 305). No other hemodynamically significant stenosis about the proximal great vessels. Right carotid system: Right CCA patent from its origin to the bifurcation without stenosis. Eccentric calcified plaque at the right bifurcation/proximal right ICA with associated stenosis of up to 50% by NASCET criteria. Right ICA otherwise widely patent distally without stenosis, dissection or occlusion. Left carotid system: Left CCA patent from its origin to the bifurcation without stenosis. Eccentric calcified plaque at the left bifurcation with associated stenosis of up to 40% by NASCET criteria. Left ICA otherwise widely patent distally to the skull base without stenosis, dissection or occlusion. Vertebral arteries: Both vertebral arteries arise from subclavian arteries. Vertebral arteries patent within the neck without significant stenosis, dissection or occlusion. Skeleton: No visible acute osseous abnormality. Advance degenerative spondylosis at C3-4 through C6-7, with progressive 4 mm anterolisthesis of C3 on C4. Heterotopic calcification adjacent to the right C3-4 facet suspected to reflect post treatment change. Other neck: No other acute soft tissue abnormality within the neck. No visible mass or adenopathy. Upper chest: Mild layering  secretions noted within the subglottic trachea. Visualized upper chest demonstrates no other acute finding. Review of the MIP images confirms the above findings CTA HEAD FINDINGS Anterior circulation: Petrous segments widely patent bilaterally. Mild atheromatous change within the carotid siphons without hemodynamically significant stenosis. A1 segments patent bilaterally. Normal anterior communicating artery complex. Anterior cerebral arteries widely patent to their distal aspects. No M1 stenosis or occlusion. Normal MCA bifurcations. Distal MCA branches well perfused and symmetric. Posterior circulation: Vertebral arteries widely patent to the vertebrobasilar junction without stenosis. Both PICA origins patent and normal. Basilar widely patent to its  distal aspect. Superior cerebellar arteries patent bilaterally. Both PCAs primarily supplied via the basilar well perfused to their distal aspects. Venous sinuses: Patent. Anatomic variants: None significant.  No aneurysm. Review of the MIP images confirms the above findings CT Brain Perfusion Findings: ASPECTS: Not calculated. CBF (<30%) Volume: 36mL Perfusion (Tmax>6.0s) volume: 64mL Mismatch Volume: 52mL Infarction Location:Negative CT perfusion with no evidence for acute ischemia. There is question of elevated cerebral blood flow and cerebral blood volume within the left insular/subinsular region on dedicated perfusion maps, corresponding with signal change on prior noncontrast CT. Finding is indeterminate, but could reflect changes of acute seizure or possibly infection/encephalitis. IMPRESSION: CTA HEAD AND NECK IMPRESSION: 1. Negative CTA for emergent large vessel occlusion. 2. Atheromatous plaque about the carotid bifurcations with estimated stenoses of up to 50% on the right and 40% on the left. 3. Scattered secretions within the subglottic airway. Finding suggests that this patient may be at risk for aspiration. CT PERFUSION IMPRESSION: 1. Negative CT perfusion  for acute ischemia. 2. Question elevated cerebral blood flow and cerebral blood volume within the left insular/subinsular region, corresponding with signal change on prior noncontrast head CT. Finding is nonspecific, but can be seen in the setting of seizure or possibly acute encephalitis/infection. Possible herpes infection could be considered given location. Correlation with LP and CSF values as well as EEG recommended as warranted. Additionally, further evaluation with dedicated brain MRI, with and without contrast, recommended for further evaluation. Case discussed by telephone at the time of interpretation on 01/14/2021 at 11:48 pm with provider Dr. Leonel Ramsay. Electronically Signed   By: Jeannine Boga M.D.   On: 01/07/2021 23:57   MR BRAIN WO CONTRAST  Result Date: 12/29/2020 CLINICAL DATA:  Initial evaluation for acute slurred speech, confusion. Patient with reported fever and concern for infection in the emergency room. EXAM: MRI HEAD WITHOUT CONTRAST TECHNIQUE: Multiplanar, multiecho pulse sequences of the brain and surrounding structures were obtained without intravenous contrast. COMPARISON:  Prior CTs from 01/21/2021 FINDINGS: Brain: Examination degraded by motion artifact. There is abnormal T2/FLAIR signal intensity involving the left insula and subinsular region, with extension to involve the mesial and anterior left temporal lobe, including the left hippocampus. Patchy signal abnormality also seen overlying the cortex of the left frontal operculum. Associated restricted diffusion seen within these areas on DWI sequence. Similar but less pronounced change seen involving the mesial right temporal lobe and hippocampus as well as the right insular cortex (series 5, images 69, 77). No associated hemorrhage or significant regional mass effect. Given provided history, finding is highly concerning for possible acute encephalitis, with herpes encephalitis a high consideration given the location of  these findings. A prominent 1.3 cm focus of FLAIR signal abnormality at the subcortical posterior right frontal lobe may represent involvement as well (series 15, image 16). Elsewhere, there is scattered patchy predominantly subcentimeter T2/FLAIR hyperintensity involving the periventricular deep white matter, nonspecific, but most like related chronic microvascular ischemic disease. No other evidence for acute vascular infarct. No encephalomalacia to suggest chronic cortical infarction elsewhere within the brain. No foci of susceptibility artifact to suggest acute or chronic intracranial hemorrhage. No mass lesion, midline shift or mass effect. Ventricles normal size without hydrocephalus. No visible intraventricular debris. No extra-axial fluid collection. Pituitary gland and suprasellar region within normal limits. Midline structures intact. Vascular: Major intracranial vascular flow voids are maintained. Skull and upper cervical spine: Craniocervical junction within normal limits. Bone marrow signal intensity normal. No scalp soft tissue abnormality. Sinuses/Orbits: Globes and orbital  soft tissues grossly within normal limits. Paranasal sinuses are largely clear. No significant mastoid effusion. Inner ear structures grossly normal. Other: None. IMPRESSION: 1. Abnormal T2/FLAIR signal intensity involving the left insula and subinsular region, with extension to involve the mesial and anterior left temporal lobe, including the left hippocampus. Similar but less pronounced changes involve the right temporal lobe and insular cortex. Given provided history, finding is highly concerning for possible acute encephalitis, with herpes encephalitis a high consideration given the location of these findings. Correlation with CSF values recommended. Primary differential considerations include an alternate nonspecific meningoencephalitis versus seizure. 2. No other acute intracranial abnormality. 3. Underlying chronic  microvascular ischemic disease. Electronically Signed   By: Jeannine Boga M.D.   On: 12/29/2020 01:09   CT CEREBRAL PERFUSION W CONTRAST  Result Date: 01/06/2021 CLINICAL DATA:  Initial evaluation for acute stroke, aphasia. EXAM: CT ANGIOGRAPHY HEAD AND NECK CT PERFUSION BRAIN TECHNIQUE: Multidetector CT imaging of the head and neck was performed using the standard protocol during bolus administration of intravenous contrast. Multiplanar CT image reconstructions and MIPs were obtained to evaluate the vascular anatomy. Carotid stenosis measurements (when applicable) are obtained utilizing NASCET criteria, using the distal internal carotid diameter as the denominator. Multiphase CT imaging of the brain was performed following IV bolus contrast injection. Subsequent parametric perfusion maps were calculated using RAPID software. CONTRAST:  147mL OMNIPAQUE IOHEXOL 350 MG/ML SOLN COMPARISON:  Comparison made with prior head CT from earlier the same day. FINDINGS: CTA NECK FINDINGS Aortic arch: Visualized aortic arch of normal caliber with normal 3 vessel morphology. Mild atheromatous change about the arch and origin of the great vessels. Eccentric calcified plaque at the proximal left subclavian artery with short-segment stenosis of up to 70% (series 7, image 305). No other hemodynamically significant stenosis about the proximal great vessels. Right carotid system: Right CCA patent from its origin to the bifurcation without stenosis. Eccentric calcified plaque at the right bifurcation/proximal right ICA with associated stenosis of up to 50% by NASCET criteria. Right ICA otherwise widely patent distally without stenosis, dissection or occlusion. Left carotid system: Left CCA patent from its origin to the bifurcation without stenosis. Eccentric calcified plaque at the left bifurcation with associated stenosis of up to 40% by NASCET criteria. Left ICA otherwise widely patent distally to the skull base without  stenosis, dissection or occlusion. Vertebral arteries: Both vertebral arteries arise from subclavian arteries. Vertebral arteries patent within the neck without significant stenosis, dissection or occlusion. Skeleton: No visible acute osseous abnormality. Advance degenerative spondylosis at C3-4 through C6-7, with progressive 4 mm anterolisthesis of C3 on C4. Heterotopic calcification adjacent to the right C3-4 facet suspected to reflect post treatment change. Other neck: No other acute soft tissue abnormality within the neck. No visible mass or adenopathy. Upper chest: Mild layering secretions noted within the subglottic trachea. Visualized upper chest demonstrates no other acute finding. Review of the MIP images confirms the above findings CTA HEAD FINDINGS Anterior circulation: Petrous segments widely patent bilaterally. Mild atheromatous change within the carotid siphons without hemodynamically significant stenosis. A1 segments patent bilaterally. Normal anterior communicating artery complex. Anterior cerebral arteries widely patent to their distal aspects. No M1 stenosis or occlusion. Normal MCA bifurcations. Distal MCA branches well perfused and symmetric. Posterior circulation: Vertebral arteries widely patent to the vertebrobasilar junction without stenosis. Both PICA origins patent and normal. Basilar widely patent to its distal aspect. Superior cerebellar arteries patent bilaterally. Both PCAs primarily supplied via the basilar well perfused to their distal aspects.  Venous sinuses: Patent. Anatomic variants: None significant.  No aneurysm. Review of the MIP images confirms the above findings CT Brain Perfusion Findings: ASPECTS: Not calculated. CBF (<30%) Volume: 78mL Perfusion (Tmax>6.0s) volume: 18mL Mismatch Volume: 1mL Infarction Location:Negative CT perfusion with no evidence for acute ischemia. There is question of elevated cerebral blood flow and cerebral blood volume within the left  insular/subinsular region on dedicated perfusion maps, corresponding with signal change on prior noncontrast CT. Finding is indeterminate, but could reflect changes of acute seizure or possibly infection/encephalitis. IMPRESSION: CTA HEAD AND NECK IMPRESSION: 1. Negative CTA for emergent large vessel occlusion. 2. Atheromatous plaque about the carotid bifurcations with estimated stenoses of up to 50% on the right and 40% on the left. 3. Scattered secretions within the subglottic airway. Finding suggests that this patient may be at risk for aspiration. CT PERFUSION IMPRESSION: 1. Negative CT perfusion for acute ischemia. 2. Question elevated cerebral blood flow and cerebral blood volume within the left insular/subinsular region, corresponding with signal change on prior noncontrast head CT. Finding is nonspecific, but can be seen in the setting of seizure or possibly acute encephalitis/infection. Possible herpes infection could be considered given location. Correlation with LP and CSF values as well as EEG recommended as warranted. Additionally, further evaluation with dedicated brain MRI, with and without contrast, recommended for further evaluation. Case discussed by telephone at the time of interpretation on 12/30/2020 at 11:48 pm with provider Dr. Leonel Ramsay. Electronically Signed   By: Jeannine Boga M.D.   On: 01/10/2021 23:57   DG Chest Port 1 View  Result Date: 12/29/2020 CLINICAL DATA:  COVID.  Shortness of breath EXAM: PORTABLE CHEST 1 VIEW COMPARISON:  Yesterday FINDINGS: Cardiomegaly. New opacity with volume loss in the right infrahilar lung. No edema, effusion, or pneumothorax. Spinal degeneration and scoliosis IMPRESSION: Worsened right infrahilar aeration which could be atelectasis or infection. Electronically Signed   By: Monte Fantasia M.D.   On: 12/29/2020 04:18   DG Chest Portable 1 View  Result Date: 12/29/2020 CLINICAL DATA:  Aphasia.  Acute mental status change. EXAM: PORTABLE CHEST  1 VIEW COMPARISON:  March 11, 2015 FINDINGS: The heart size and mediastinal contours are within normal limits. Both lungs are clear. The visualized skeletal structures are unremarkable. IMPRESSION: No active disease. Electronically Signed   By: Dorise Bullion III M.D   On: 01/13/2021 21:28    EKG: Independently reviewed.  Sinus rhythm, QTC 502.  LBBB similar to prior tracing from 2018.  ST depressions in lateral leads appears new.  Assessment/Plan Principal Problem:   Encephalitis Active Problems:   Sepsis ([place])   AMS (altered mental status)   Acute hypoxemic respiratory failure ([facility])   EKG abnormality   Sepsis and AMS secondary to suspected encephalitis Febrile, tachycardic, and tachypneic.  Labs showing leukocytosis. Brain MRI showing findings concerning for possible acute encephalitis with herpes encephalitis a high consideration given the location of abnormal findings.  Patient is currently obtunded. -Underwent LP in the ED and CSF analysis labs including CSF HSV PCR, cell count, protein, glucose, culture pending.  Patient received vancomycin, ceftriaxone, ampicillin, and acyclovir.  Lactic acid level pending.  Blood culture x2 pending.  Neurology following.  Discussed with critical care and patient is being admitted to the ICU for further care.  Acute hypoxemic respiratory failure, suspect due to aspiration: Patient was initially not hypoxic and chest x-ray was negative for acute abnormality.  While in the ED, nursing staff noticed that she was making gurgling sounds from her  throat.  Soon after she desatted to the 80s and now requiring 8 L HFNC/nonrebreather to maintain sats in the 90s.  She is tachypneic.  SARS-CoV-2 PCR test came back positive but per son patient is fully vaccinated including booster shot.  Repeated chest x-ray showing worsening right infrahilar aeration which could be atelectasis or infection. -Suspect hypoxemia is due to aspiration event.  Patient is currently obtunded  and I am concerned that she may not be able to protect her airway.  Discussed with critical care and patient is being admitted to the ICU for further management.  Continue antibiotics for coverage of aspiration pneumonia.  Airborne and contact precautions.  N.p.o., aspiration precautions.  Covid inflammatory markers ordered and currently pending.  EKG abnormality: EKG showing ST depressions in lateral leads which appear new compared to prior tracing from 2018. -High-sensitivity troponin ordered and currently pending.  DVT prophylaxis: SCDs Code Status: Full code-discussed with the patient's son at bedside. Family Communication: Son updated. Disposition Plan: Status is: Inpatient  Remains inpatient appropriate because:Altered mental status, IV treatments appropriate due to intensity of illness or inability to take PO and Inpatient level of care appropriate due to severity of illness   Dispo: The patient is from: Home              Anticipated d/c is to: SNF              Anticipated d/c date is: > 3 days              Patient currently is not medically stable to d/c.  The medical decision making on this patient was of high complexity and the patient is at high risk for clinical deterioration, therefore this is a level 3 visit.  Shela Leff MD [facility]  If 7PM-7AM, please contact night-coverage [email or web address]  12/29/2020, 5:23 AM

## 2020-12-29 NOTE — ED Provider Notes (Signed)
Stroke versus encephalitis.  Pending MRI, reevaluation and likely LP if needed.  Antivirals already ordered.   Physical Exam  BP (!) 167/91    Pulse (!) 101    Temp (!) 102.2 F (39 C) (Oral)    Resp (!) 22    Wt 53.7 kg    SpO2 94%    BMI 21.31 kg/m   Physical Exam Vitals and nursing note reviewed.  HENT:     Head: Normocephalic.     Mouth/Throat:     Mouth: Mucous membranes are moist.  Cardiovascular:     Rate and Rhythm: Tachycardia present.  Abdominal:     General: Abdomen is flat.  Musculoskeletal:        General: No swelling or tenderness. Normal range of motion.  Skin:    General: Skin is warm and dry.  Neurological:     General: No focal deficit present.     ED Course/Procedures     .Critical Care Performed by: Merrily Pew, MD Authorized by: Merrily Pew, MD   Critical care provider statement:    Critical care time (minutes):  60   Critical care was necessary to treat or prevent imminent or life-threatening deterioration of the following conditions:  Respiratory failure, CNS failure or compromise and sepsis   Critical care was time spent personally by me on the following activities:  Discussions with consultants, evaluation of patient's response to treatment, examination of patient, ordering and performing treatments and interventions, ordering and review of laboratory studies, ordering and review of radiographic studies, pulse oximetry, re-evaluation of patient's condition, obtaining history from patient or surrogate and review of old charts .Lumbar Puncture  Date/Time: 12/29/2020 2:30 AM Performed by: Merrily Pew, MD Authorized by: Merrily Pew, MD   Consent:    Consent obtained:  Verbal   Consent given by:  Patient   Risks discussed:  Bleeding, headache, infection, nerve damage, repeat procedure and pain   Alternatives discussed:  No treatment, delayed treatment and alternative treatment Universal protocol:    Procedure explained and questions answered  to patient or proxy's satisfaction: yes     Relevant documents present and verified: yes     Imaging studies available: yes     Immediately prior to procedure a time out was called: yes     Site/side marked: yes     Patient identity confirmed:  Verbally with patient and hospital-assigned identification number Pre-procedure details:    Procedure purpose:  Diagnostic   Preparation: Patient was prepped and draped in usual sterile fashion   Anesthesia:    Anesthesia method:  Local infiltration   Local anesthetic:  Lidocaine 1% w/o epi Procedure details:    Lumbar space:  L3-L4 interspace   Patient position:  Sitting   Needle gauge:  22   Needle type:  Spinal needle - Quincke tip   Needle length (in):  3.5   Number of attempts:  1   Opening pressure (cm H2O): high, not measured.   Fluid appearance:  Blood-tinged   Tubes of fluid:  4   Total volume (ml):  10 Post-procedure details:    Puncture site:  Adhesive bandage applied and direct pressure applied   Procedure completion:  Tolerated well, no immediate complications    MDM   MRI is consistent with likely encephalitis concern for HSV encephalitis.  LP performed as per procedure note below with significant high pressure.  Discussed with neurology who recommended adding on ampicillin as well for bacterial coverage as high pressures like that  are not usually consistent with viral sources.  Still recommended admission  With mild hypoxia.  Improved with nasal cannula oxygen however there is some concern that she may have aspirated prior to coming.  Her x-ray was reviewed does not show any evidence of pneumonitis.  We will continue to monitor.  Discussions had with son and she is full code and aggressive care unless it is thought that she has irreparable damage.  Discussed with hospitalist who will admit.       Kylee Umana, Corene Cornea, MD 12/30/20 [phone]

## 2020-12-29 NOTE — Procedures (Signed)
Patient Name: Tara Sloan  MRN: [id number]  Epilepsy Attending: Lora Havens  Referring Physician/Provider: Dr Roland Rack Date: 12/29/2020 Duration: 25.03 mins  Patient history: 75 year old female with progressive aphasia and right-sided weakness. EEG to evaluate for seizure  Level of alertness: Awake  AEDs during EEG study: None  Technical aspects: This EEG study was done with scalp electrodes positioned according to the 10-20 International system of electrode placement. Electrical activity was acquired at a sampling rate of 500Hz  and reviewed with a high frequency filter of 70Hz  and a low frequency filter of 1Hz . EEG data were recorded continuously and digitally stored.   Description: No posterior dominant rhythm was seen. Lateralized periodic epileptiform discharges were seen in left hemisphere, maximal left centro-parietal region, at 0.5-1Hz . One seizure without clinical signs was noted at 1029, arising from left centro-parietal region, lasted about 13 seconds. EEG also showed continuous generalized 3 to 6 Hz theta-delta slowing. Hyperventilation and photic stimulation were not performed.     ABNORMALITY - Seizure without clinical signs, left centro-parietal region - Lateralized periodic epileptiform discharges, left hemisphere, maximal left centro-parietal region -Continuous slow, generalized  IMPRESSION: This study showed one seizure without clinical signs at 1029, arising from left centro-parietal region, lasted about 13 seconds. There are also lateralized periodic epileptiform discharges, left hemisphere, maximal left centro-parietal region suggestive of underlying structural abnormality. Additionally there is moderate diffuse encephalopathy, nonspecific etiology but likely related to seizure, underlying structural abnormality.  Dr Lorrin Goodell was notified.   [name] Barbra Sarks

## 2020-12-29 NOTE — ED Notes (Signed)
Patient transported to MRI 

## 2020-12-29 NOTE — Progress Notes (Signed)
Sons Robbie and [place] updated.

## 2020-12-29 NOTE — ED Notes (Signed)
Saulter's placed.

## 2020-12-30 ENCOUNTER — Inpatient Hospital Stay (HOSPITAL_COMMUNITY): Payer: Medicare Other

## 2020-12-30 DIAGNOSIS — U071 COVID-19: Secondary | ICD-10-CM | POA: Diagnosis not present

## 2020-12-30 DIAGNOSIS — J9601 Acute respiratory failure with hypoxia: Secondary | ICD-10-CM | POA: Diagnosis not present

## 2020-12-30 DIAGNOSIS — G9340 Encephalopathy, unspecified: Secondary | ICD-10-CM

## 2020-12-30 LAB — GLUCOSE, CAPILLARY
Glucose-Capillary: 102 mg/dL — ABNORMAL HIGH (ref 70–99)
Glucose-Capillary: 133 mg/dL — ABNORMAL HIGH (ref 70–99)
Glucose-Capillary: 136 mg/dL — ABNORMAL HIGH (ref 70–99)
Glucose-Capillary: 138 mg/dL — ABNORMAL HIGH (ref 70–99)
Glucose-Capillary: 139 mg/dL — ABNORMAL HIGH (ref 70–99)
Glucose-Capillary: 40 mg/dL — CL (ref 70–99)
Glucose-Capillary: 78 mg/dL (ref 70–99)
Glucose-Capillary: 94 mg/dL (ref 70–99)

## 2020-12-30 LAB — MAGNESIUM: Magnesium: 2.5 mg/dL — ABNORMAL HIGH (ref 1.7–2.4)

## 2020-12-30 LAB — BASIC METABOLIC PANEL
Anion gap: 9 (ref 5–15)
Anion gap: 12 (ref 5–15)
BUN: 36 mg/dL — ABNORMAL HIGH (ref 8–23)
BUN: 36 mg/dL — ABNORMAL HIGH (ref 8–23)
CO2: 24 mmol/L (ref 22–32)
CO2: 22 mmol/L (ref 22–32)
Calcium: 7.7 mg/dL — ABNORMAL LOW (ref 8.9–10.3)
Calcium: 7.9 mg/dL — ABNORMAL LOW (ref 8.9–10.3)
Chloride: 110 mmol/L (ref 98–111)
Chloride: 110 mmol/L (ref 98–111)
Creatinine, Ser: 0.93 mg/dL (ref 0.44–1.00)
Creatinine, Ser: 0.92 mg/dL (ref 0.44–1.00)
GFR, Estimated: >60 mL/min (ref >60)
GFR, Estimated: >60 mL/min (ref >60)
Glucose, Bld: 137 mg/dL — ABNORMAL HIGH (ref 70–99)
Glucose, Bld: 197 mg/dL — ABNORMAL HIGH (ref 70–99)
Potassium: 3.1 mmol/L — ABNORMAL LOW (ref 3.5–5.1)
Potassium: 3.4 mmol/L — ABNORMAL LOW (ref 3.5–5.1)
Sodium: 143 mmol/L (ref 135–145)
Sodium: 144 mmol/L (ref 135–145)

## 2020-12-30 LAB — CBC
HCT: 37.7 % (ref 36.0–46.0)
Hemoglobin: 11.8 g/dL — ABNORMAL LOW (ref 12.0–15.0)
MCH: 29.4 pg (ref 26.0–34.0)
MCHC: 31.3 g/dL (ref 30.0–36.0)
MCV: 93.8 fL (ref 80.0–100.0)
Platelets: 168 10*3/uL (ref 150–400)
RBC: 4.02 MIL/uL (ref 3.87–5.11)
RDW: 14.5 % (ref 11.5–15.5)
WBC: 14.8 10*3/uL — ABNORMAL HIGH (ref 4.0–10.5)
nRBC: 0 % (ref 0.0–0.2)

## 2020-12-30 LAB — PHOSPHORUS: Phosphorus: 1.9 mg/dL — ABNORMAL LOW (ref 2.5–4.6)

## 2020-12-30 MED ORDER — DEXTROSE 10 % IV SOLN: INTRAVENOUS | Status: DC

## 2020-12-30 MED ORDER — IOHEXOL 350 MG/ML SOLN
75.0000 mL | Freq: Once | INTRAVENOUS | Status: AC | PRN
  Administered 2020-12-30: 75 mL via INTRAVENOUS

## 2020-12-30 MED ORDER — DEXTROSE 50 % IV SOLN
INTRAVENOUS | Status: AC
  Filled 2020-12-30: qty 50

## 2020-12-30 MED ORDER — VALPROATE SODIUM 100 MG/ML IV SOLN
500.0000 mg | Freq: Two times a day (BID) | INTRAVENOUS | Status: AC
  Administered 2020-12-31: 500 mg via INTRAVENOUS
  Filled 2020-12-30 (×3): qty 5

## 2020-12-30 MED ORDER — LEVETIRACETAM IN NACL 1000 MG/100ML IV SOLN
1000.0000 mg | Freq: Once | INTRAVENOUS | Status: AC
  Administered 2020-12-30: 1000 mg via INTRAVENOUS
  Filled 2020-12-30: qty 100

## 2020-12-30 MED ORDER — LEVETIRACETAM IN NACL 1000 MG/100ML IV SOLN
1000.0000 mg | Freq: Two times a day (BID) | INTRAVENOUS | Status: DC
  Administered 2020-12-31 – 2021-01-01 (×3): 1000 mg via INTRAVENOUS
  Filled 2020-12-30 (×3): qty 100

## 2020-12-30 MED ORDER — GADOBUTROL 1 MMOL/ML IV SOLN
5.5000 mL | Freq: Once | INTRAVENOUS | Status: AC | PRN
  Administered 2020-12-30: 5.5 mL via INTRAVENOUS

## 2020-12-30 MED ORDER — VALPROATE SODIUM 100 MG/ML IV SOLN
1000.0000 mg | Freq: Once | INTRAVENOUS | Status: AC
  Administered 2020-12-30: 1000 mg via INTRAVENOUS
  Filled 2020-12-30: qty 10

## 2020-12-30 MED ORDER — DEXTROSE 50 % IV SOLN
25.0000 g | INTRAVENOUS | Status: AC
  Administered 2020-12-30: 25 g via INTRAVENOUS

## 2020-12-30 MED ORDER — POTASSIUM PHOSPHATES 15 MMOLE/5ML IV SOLN
30.0000 mmol | Freq: Once | INTRAVENOUS | Status: AC
  Administered 2020-12-30: 30 mmol via INTRAVENOUS
  Filled 2020-12-30: qty 10

## 2020-12-30 MED ORDER — DEXTROSE-NACL 5-0.9 % IV SOLN: INTRAVENOUS | Status: DC

## 2020-12-30 MED ORDER — METHYLPREDNISOLONE SODIUM SUCC 125 MG IJ SOLR
60.0000 mg | Freq: Two times a day (BID) | INTRAMUSCULAR | Status: DC
  Administered 2020-12-30 – 2020-12-31 (×3): 60 mg via INTRAVENOUS
  Filled 2020-12-30 (×3): qty 2

## 2020-12-30 NOTE — Progress Notes (Addendum)
NAME:  Tara Sloan, MRN:  [id number], DOB:  [birth year], LOS: 1 ADMISSION DATE:  12/30/2020, CONSULTATION DATE:  12/29/2020 REFERRING MD:  TRH, CHIEF COMPLAINT:  Airway protection   Brief History:  75 yo F with a history of diffuse large B cell lymphoma in remission (2012) and CHF with mildly reduced EF who presented to ED with slurring words, aphasia, and fever and MRI concerning for encephalitis.  Initially triaged to hospital medicine, however, had progressively worsening hypoxia and tachypnea, with concern for her ability to protect her airway. Retriaged to ICU. Will plan to admit to 4N ICU.   History of Present Illness:  75 yo F with a history of diffuse large B cell lymphoma in remission (2012) and CHF with mildly reduced EF (EF 45-50% in 2015) who presented with slurring words, aphasia, fever, and MRI concerning for encephalitis, with a typical distribution for HSV encephalitis.  I gathered all of the history from [name] at bedside.  Patient started not acting quite herself on Wednesday, but still was conversational.  When her son saw her Friday night around 6pm, she was slurring words and they called EMS.  On arrival to the ED, she was only able to follow directions intermittently, and was on room air.  Nonfocal exam.  She progressed to not being able to follow any directions, but she is responsive to noxious stimuli.  LP was done in the ED after Code stroke scans were negative but MRI showed possible encephalitis.  She has had increasing O2 requirements and upper airway gurgling, though no gross aspiration event was noted.  Because of concern for airway protection, hospitalist called ICU for admission.    She was found to be COVID positive, though exhibited no signs of COVID-19 illness.  She is fully vaccinated, and her son thinks she also got a booster.        Past Medical History:  DLBCL CHF (45-50% in 2015)   Significant Hospital Events:    Consults:   Neurology  Procedures:  Lumbar puncture 1/7 EEG 1/ 8 >> one seizure without clinical signs at 1029, arising from left centro-parietal region, lasted about 13 seconds. There are also lateralized periodic epileptiform discharges, left hemisphere, maximal left centro-parietal region suggestive of underlying structural abnormality  Significant Diagnostic Tests:    HIV neg  MRI 1/7: 1. Abnormal T2/FLAIR signal intensity involving the left insula and subinsular region, with extension to involve the mesial and anterior left temporal lobe, including the left hippocampus. Similar but less pronounced changes involve the right temporal lobe and insular cortex. Given provided history, finding is highly concerning for possible acute encephalitis, with herpes encephalitis a high consideration given the location of these findings. Correlation with CSF values recommended. Primary differential considerations include an alternate nonspecific meningoencephalitis versus seizure. 2. No other acute intracranial abnormality. 3. Underlying chronic microvascular ischemic disease.  Micro Data:  CSF: protein 68, glucose 82, WBCs present. 5-9 WCs, lymphs  - Crypto Ag >> - HSV PCR >> - VZV PCR >> - CSF cx neg  SARS COV2 positive   RVP neg  Antimicrobials:  Vancomycin Ceftriaxone Ampicillin Acyclovir 1/8 >>  Interim History / Subjective:   T-max 1015  1/8 , low-grade febrile today. Remains on salter high flow nasal cannula saturation 92% range No seizure activity noted  Objective   Blood pressure (!) 105/53, pulse 83, temperature (!) 100.4 F (38 C), temperature source Axillary, resp. rate (!) 27, height 5\' 4"  (1.626 m), weight 53.1 kg,  SpO2 94 %.        Intake/Output Summary (Last 24 hours) at 12/30/2020 1037 Last data filed at 12/30/2020 0900 Gross per 24 hour  Intake 1048.35 ml  Output 1150 ml  Net -101.65 ml   Filed Weights   Jan 09, 2021 2300 12/29/20 0733  Weight: 53.7 kg 53.1 kg     Examination: General: More awake, eyes closed but will open to stimuli HENT: no oral lesions seen, limited exam, no thrush Lungs: Decreased rhonchi, no accessory muscle use, Cardiovascular: regular rate and rhythm Abdomen: soft, nontender, nondistended Extremities: warm, no edema Neuro:  Aphasia, not following commands.  PERRL.  Purposeful, grossly nonfocal moves all 4 extremities GU: no abnormalities  Labs 1/8 showed mild hypokalemia, mild leukocytosis, left shift  Resolved Hospital Problem list     Assessment & Plan:  # Acute respiratory failure:  Likely due to silent aspiration and limited ability to control secretions.  Repeat CXR while in the ED shows some R sided atelectasis, mucus plugging.  Currently protecting airway enough, but discussed with her son that she may need to be intubated for airway protection.  Not clear whether Covid is actually causing symptoms or is incidental -Keep Unasyn for aspiration pneumonia/ UTI -Add Solu-Medrol for Covid pneumonia. -If creatinine okay, proceed with CT angiogram chest -Able to maintain airway currently  Addendum - CT angio shows RLL atx , favor aspiration rather than covid Will add chest PT , hold off saline nebs since covid  # Acute encephalopathy, concern for encephalitis on MRI:  Aphasic.  Seizure?  Postictal -Await CSF studies:  HSV, cryptococcal Ag, VZV PCR, and fungal culture - continue acyclovir -Keppra per neurology, defer need for LTM EEG to them  Chronic medical problems: # history of diffuse large B cell lymphoma # CHF, mildly reduced EF in 2015   Best practice (evaluated daily)  Diet: NPO , if mental status remains poor by 1/10 place NG tube Pain/Anxiety/Delirium protocol (if indicated): n/a VAP protocol (if indicated): n/a DVT prophylaxis: lovenox GI prophylaxis: n/a Glucose control: SSI Mobility: PT Disposition: ICU  Goals of Care:  Last date of multidisciplinary goals of care discussion: 1/8 Family  and staff present:  son [name] Summary of discussion: code status is full code Follow up goals of care discussion due: son bryant updated 1/9 Code Status: Full  Labs   CBC: Recent Labs  Lab 01/09/21 2214 2021-01-09 2219  WBC  --  15.1*  NEUTROABS  --  12.6*  HGB 14.3 14.2  HCT 42.0 44.1  MCV  --  92.1  PLT  --  478    Basic Metabolic Panel: Recent Labs  Lab 09-Jan-2021 2214 01/09/2021 2219  NA 143 140  K 3.4* 3.5  CL 104 104  CO2  --  24  GLUCOSE 135* 140*  BUN 39* 38*  CREATININE 0.80 0.87  CALCIUM  --  8.7*   GFR: Estimated Creatinine Clearance: 46.8 mL/min (by C-G formula based on SCr of 0.87 mg/dL). Recent Labs  Lab January 09, 2021 2219 12/29/20 0338 12/29/20 0609 12/29/20 1331  PROCALCITON  --   --  <0.10  --   WBC 15.1*  --   --   --   LATICACIDVEN  --  2.3*  --  2.3*    Liver Function Tests: Recent Labs  Lab 2021/01/09 2219  AST 30  ALT 21  ALKPHOS 50  BILITOT 0.8  PROT 7.0  ALBUMIN 3.7   No results for input(s): LIPASE, AMYLASE in the last 168 hours.  No results for input(s): AMMONIA in the last 168 hours.  ABG    Component Value Date/Time   TCO2 25 01/08/2021 2214     Critical care time: 40 min     This patient is critically ill with acute hypoxic respiratory failure; which, requires frequent high complexity decision making, assessment, support, evaluation, and titration of therapies. This was completed through the application of advanced monitoring technologies and extensive interpretation of multiple databases. During this encounter critical care time was devoted to patient care services described in this note for 35 minutes.

## 2020-12-30 NOTE — Progress Notes (Signed)
NEUROLOGY CONSULTATION PROGRESS NOTE   Date of service: December 30, 2020 Patient Name: Tara Sloan MRN:  [id number] DOB:  September 05, 1946  Brief HPI  Tara Sloan is a 75 y.o. female presenting with acute onset slurred speech and aphasia over the course of a day(LKW 1/6). MRI brain with T2 flair hyperintensity in the left insula, subinsular region with extension into the mesial and anterior left temporal lobe and left hippocampus.  Also noted to have similar but less pronounced changes on the right temporal lobe and insula.  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  Work-up with LP with CSF profile on tube 4 with mild pleocytosis, mildly elevated protein to 68 but I think that may all be derived from blood given elevated RBCs to 50 in tube 1. CSF cultures with no growth as on 1/9.   HSV and VZV PCR and IgM and IgG results are still pending.   Interval Hx   Continues to be encephalopathic. HSV and VZV PCR and IgM and IgG results are still pending.  Vitals   Vitals:   12/30/20 0400 12/30/20 0500 12/30/20 0600 12/30/20 0700  BP: 105/68 (!) 107/57 (!) 114/59 (!) 112/59  Pulse: 91 85 84 90  Resp: (!) 24 (!) 29 (!) 24 (!) 21  Temp: (!) 100.5 F (38.1 C)     TempSrc: Axillary     SpO2: 97% 94% 97% 95%  Weight:      Height:         Body mass index is 20.09 kg/m.  Physical Exam   General: Laying comfortably in bed; in no acute distress. HENT: Normal oropharynx and mucosa. Normal external appearance of ears and nose. Neck: Supple, no pain or tenderness CV: No JVD. No peripheral edema. Pulmonary: Symmetric Chest rise. Normal respiratory effort. Abdomen: Soft to touch, non-tender. Ext: No cyanosis, edema, or deformity Skin: No rash. Normal palpation of skin.  Musculoskeletal: Normal digits and nails by inspection. No clubbing.  Neurologic Examination  Mental status/Cognition: Somnolent, eyes closed, does not open eyes to voice or loud clap, closes eyes tight shut when  attempting to open. Does not follow any commands even with encouragement. No speech. Facial: Symmetric grimace to Qtip in her nose and does bring her left hand up to pull it out of her nostril. Seems to be moving all extremities spontaneously. Mildly increased tone in all extremities.  Sensation:  Light touch Does not withdraw any extremities to nailbed pressure   Pin prick    Temperature    Vibration   Proprioception    Coordination/Complex Motor:  Unable to assess.  Labs   Basic Metabolic Panel:  Lab Results  Component Value Date   NA 140 30-Dec-2020   K 3.5 2020-12-30   CO2 24 2020/12/30   GLUCOSE 140 (H) 2020-12-30   BUN 38 (H) 12-30-20   CREATININE 0.87 12/30/2020   CALCIUM 8.7 (L) December 30, 2020   GFRNONAA >60 2020/12/30   GFRAA >60 05/28/2018   HbA1c:  Lab Results  Component Value Date   HGBA1C 5.9 11/20/2020   LDL:  Lab Results  Component Value Date   LDLCALC 54 11/20/2020   Urine Drug Screen:     Component Value Date/Time   LABOPIA NONE DETECTED 12/29/2020 1459   COCAINSCRNUR NONE DETECTED 12/29/2020 1459   LABBENZ NONE DETECTED 12/29/2020 1459   AMPHETMU NONE DETECTED 12/29/2020 1459   THCU NONE DETECTED 12/29/2020 1459   LABBARB NONE DETECTED 12/29/2020 1459    Alcohol Level No results found for:  ETH No results found for: PHENYTOIN, ZONISAMIDE, LAMOTRIGINE, LEVETIRACETA No results found for: PHENYTOIN, PHENOBARB, VALPROATE, CBMZ  Imaging and Diagnostic studies    MRI Brain without contrast:  IMPRESSION: 1. Abnormal T2/FLAIR signal intensity involving the left insula and subinsular region, with extension to involve the mesial and anterior left temporal lobe, including the left hippocampus. Similar but less pronounced changes involve the right temporal lobe and insular cortex. Given provided history, finding is highly concerning for possible acute encephalitis, with herpes encephalitis a high consideration given the location of these findings.  Correlation with CSF values recommended. Primary differential considerations include an alternate nonspecific meningoencephalitis versus seizure. 2. No other acute intracranial abnormality. 3. Underlying chronic microvascular ischemic disease.   Impression   Tara Sloan is a 75 y.o. female presenting with acute onset slurred speech and aphasia over the course of a day(LKW 1/6). MRI brain with T2 flair hyperintensity in the left insula, subinsular region with extension into the mesial and anterior left temporal lobe and left hippocampus.  Also noted to have similar but less pronounced changes on the right temporal lobe and insula.  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  Work-up with LP with CSF profile on tube 4 with mild pleocytosis, mildly elevated protein to 68 but I think that may all be derived from blood given elevated RBCs to 50 in tube 1. CSF cultures with no growth as on 1/9.   HSV and VZV PCR and IgM and IgG results are still pending. The Prelim CSF studies so far are not concerning for a bacterial meningitis and thus Vanc, ceftriaxone were discontinued. She is also being treated for aspiration Pneumonia with Ampicllin.  Recommendations  - continue Acyclovir - HSV and VZV PCR in CSF and serum IgM and IgG results are still pending. - Repeat MRI Brain with and without contrast is pending. - Continue Keppra 500mg  BID - Seizure precautions. ______________________________________________________________________   Thank you for the opportunity to take part in the care of this patient. If you have any further questions, please contact the neurology consultation attending.  Signed,  [place] Pager Number [phone]

## 2020-12-30 NOTE — Progress Notes (Signed)
[facility] Progress Note Patient Name: Tara Sloan DOB: 1946/01/25 MRN: [id number]   Date of Service  12/30/2020  HPI/Events of Note  Patient required treatment for 2x episodes of hypoglycemia this evening.   eICU Interventions  Start D5 NS at 75cc/hr to help prevent recurrences.     Intervention Category Intermediate Interventions: Other:  Charlott Rakes 12/30/2020, 3:43 AM

## 2020-12-30 NOTE — Progress Notes (Signed)
Patient straight cathed twice within 24 hours. No void since 2300 last night. IVF infusing 75cc/hr since 0430. Per CCM, place indwelling for close monitoring of UO. Minimal urine noted upon insertion. CCM aware, BMP to be done. Will closely monitor.

## 2020-12-30 NOTE — Progress Notes (Signed)
Hypoglycemic Event  CBG: 40  Treatment: Dextrose amp given. CCM aware.   Symptoms: Diaphoretic/lethargic  Follow-up CBG: Time:1235 CBG Result:78   Comments/MD notified:CCM aware. D10 ordered.     Tara Sloan

## 2020-12-31 ENCOUNTER — Inpatient Hospital Stay (HOSPITAL_COMMUNITY): Payer: Medicare Other

## 2020-12-31 ENCOUNTER — Inpatient Hospital Stay: Payer: Self-pay

## 2020-12-31 DIAGNOSIS — U071 COVID-19: Secondary | ICD-10-CM | POA: Diagnosis not present

## 2020-12-31 DIAGNOSIS — J9601 Acute respiratory failure with hypoxia: Secondary | ICD-10-CM | POA: Diagnosis not present

## 2020-12-31 DIAGNOSIS — G9340 Encephalopathy, unspecified: Secondary | ICD-10-CM | POA: Diagnosis not present

## 2020-12-31 LAB — VALPROIC ACID LEVEL: Valproic Acid Lvl: 38 ug/mL — ABNORMAL LOW (ref 50.0–100.0)

## 2020-12-31 LAB — MAGNESIUM: Magnesium: 2.4 mg/dL (ref 1.7–2.4)

## 2020-12-31 LAB — URINE CULTURE: Culture: NO GROWTH

## 2020-12-31 LAB — CBC
HCT: 40.1 % (ref 36.0–46.0)
Hemoglobin: 12.9 g/dL (ref 12.0–15.0)
MCH: 29.2 pg (ref 26.0–34.0)
MCHC: 32.2 g/dL (ref 30.0–36.0)
MCV: 90.7 fL (ref 80.0–100.0)
Platelets: 160 10*3/uL (ref 150–400)
RBC: 4.42 MIL/uL (ref 3.87–5.11)
RDW: 14.5 % (ref 11.5–15.5)
WBC: 16.1 10*3/uL — ABNORMAL HIGH (ref 4.0–10.5)
nRBC: 0 % (ref 0.0–0.2)

## 2020-12-31 LAB — BASIC METABOLIC PANEL
Anion gap: 14 (ref 5–15)
BUN: 28 mg/dL — ABNORMAL HIGH (ref 8–23)
CO2: 21 mmol/L — ABNORMAL LOW (ref 22–32)
Calcium: 7.6 mg/dL — ABNORMAL LOW (ref 8.9–10.3)
Chloride: 107 mmol/L (ref 98–111)
Creatinine, Ser: 0.78 mg/dL (ref 0.44–1.00)
GFR, Estimated: >60 mL/min (ref >60)
Glucose, Bld: 170 mg/dL — ABNORMAL HIGH (ref 70–99)
Potassium: 3.3 mmol/L — ABNORMAL LOW (ref 3.5–5.1)
Sodium: 142 mmol/L (ref 135–145)

## 2020-12-31 LAB — PHOSPHORUS: Phosphorus: <1 mg/dL — CL (ref 2.5–4.6)

## 2020-12-31 LAB — VARICELLA ZOSTER ANTIBODY, IGM: Varicella-Zoster Ab, IgM: <0.91 index (ref 0.00–0.90)

## 2020-12-31 LAB — GLUCOSE, CAPILLARY
Glucose-Capillary: 141 mg/dL — ABNORMAL HIGH (ref 70–99)
Glucose-Capillary: 140 mg/dL — ABNORMAL HIGH (ref 70–99)
Glucose-Capillary: 140 mg/dL — ABNORMAL HIGH (ref 70–99)
Glucose-Capillary: 174 mg/dL — ABNORMAL HIGH (ref 70–99)
Glucose-Capillary: 150 mg/dL — ABNORMAL HIGH (ref 70–99)
Glucose-Capillary: 114 mg/dL — ABNORMAL HIGH (ref 70–99)

## 2020-12-31 LAB — HSV(HERPES SIMPLEX VRS) I + II AB-IGG
HSV 1 Glycoprotein G Ab, IgG: 13.7 index — ABNORMAL HIGH (ref 0.00–0.90)
HSV 2 Glycoprotein G Ab, IgG: <0.91 index (ref 0.00–0.90)

## 2020-12-31 LAB — VARICELLA ZOSTER ANTIBODY, IGG: Varicella IgG: <135 index — ABNORMAL LOW (ref >165)

## 2020-12-31 MED ORDER — OSMOLITE 1.2 CAL PO LIQD
1000.0000 mL | ORAL | Status: DC
  Administered 2020-12-31 – 2021-01-02 (×3): 1000 mL
  Filled 2020-12-31 (×3): qty 1000

## 2020-12-31 MED ORDER — DEXTROSE 5 % IV SOLN
10.0000 mg/kg | Freq: Three times a day (TID) | INTRAVENOUS | Status: DC
  Administered 2020-12-31 – 2021-01-03 (×9): 535 mg via INTRAVENOUS
  Filled 2020-12-31 (×12): qty 10.7

## 2020-12-31 MED ORDER — VALPROATE SODIUM 100 MG/ML IV SOLN
500.0000 mg | Freq: Three times a day (TID) | INTRAVENOUS | Status: DC
  Administered 2020-12-31 – 2021-01-04 (×11): 500 mg via INTRAVENOUS
  Filled 2020-12-31 (×13): qty 5

## 2020-12-31 MED ORDER — POTASSIUM PHOSPHATES 15 MMOLE/5ML IV SOLN
30.0000 mmol | Freq: Once | INTRAVENOUS | Status: AC
  Administered 2021-01-01: 30 mmol via INTRAVENOUS
  Filled 2020-12-31: qty 10

## 2020-12-31 MED ORDER — DONEPEZIL HCL 10 MG PO TABS
10.0000 mg | ORAL_TABLET | Freq: Every day | ORAL | Status: DC
  Administered 2020-12-31 – 2021-01-02 (×3): 10 mg
  Filled 2020-12-31 (×3): qty 1

## 2020-12-31 MED ORDER — BUSPIRONE HCL 15 MG PO TABS
7.5000 mg | ORAL_TABLET | Freq: Two times a day (BID) | ORAL | Status: DC
  Administered 2020-12-31 – 2021-01-03 (×6): 7.5 mg
  Filled 2020-12-31 (×6): qty 1

## 2020-12-31 MED ORDER — SODIUM CHLORIDE 0.9 % IV BOLUS
500.0000 mL | Freq: Once | INTRAVENOUS | Status: AC
  Administered 2020-12-31: 500 mL via INTRAVENOUS

## 2020-12-31 MED ORDER — ATORVASTATIN CALCIUM 80 MG PO TABS
80.0000 mg | ORAL_TABLET | Freq: Every evening | ORAL | Status: DC
  Administered 2020-12-31 – 2021-01-02 (×3): 80 mg
  Filled 2020-12-31 (×3): qty 1

## 2020-12-31 MED ORDER — LACTATED RINGERS IV SOLN: INTRAVENOUS | Status: DC

## 2020-12-31 MED ORDER — BETHANECHOL CHLORIDE 10 MG PO TABS
10.0000 mg | ORAL_TABLET | Freq: Three times a day (TID) | ORAL | Status: DC
  Administered 2020-12-31 – 2021-01-03 (×9): 10 mg
  Filled 2020-12-31 (×9): qty 1

## 2020-12-31 MED ORDER — DEXAMETHASONE 4 MG PO TABS
6.0000 mg | ORAL_TABLET | Freq: Every day | ORAL | Status: DC
  Administered 2021-01-01 – 2021-01-03 (×3): 6 mg
  Filled 2020-12-31 (×3): qty 2

## 2020-12-31 MED ORDER — POTASSIUM CHLORIDE 10 MEQ/100ML IV SOLN
10.0000 meq | INTRAVENOUS | Status: AC
Start: 2020-12-31 — End: 2020-12-31
  Administered 2020-12-31 (×4): 10 meq via INTRAVENOUS
  Filled 2020-12-31 (×4): qty 100

## 2020-12-31 NOTE — Progress Notes (Signed)
NAME:  Tara Sloan, MRN:  [id number], DOB:  Apr 27, 1946, LOS: 2 ADMISSION DATE:  08-Jan-2021, CONSULTATION DATE:  12/29/2020 REFERRING MD:  TRH, CHIEF COMPLAINT:  Airway protection   Brief History:  75 yo F with a history of diffuse large B cell lymphoma in remission (2012) and CHF with mildly reduced EF (EF 45-50% in 2015) who presented with slurring words, aphasia, fever, and MRI concerning for encephalitis, with a typical distribution for HSV encephalitis.  I gathered all of the history from [name] at bedside.  Patient started not acting quite herself on Wednesday, but still was conversational.  When her son saw her Friday night around 6pm, she was slurring words and they called EMS.  On arrival to the ED, she was only able to follow directions intermittently, and was on room air.  Nonfocal exam.  She progressed to not being able to follow any directions, but she is responsive to noxious stimuli.  LP was done in the ED after Code stroke scans were negative but MRI showed possible encephalitis.  She has had increasing O2 requirements and upper airway gurgling, though no gross aspiration event was noted.  Because of concern for airway protection, hospitalist called ICU for admission.    She was found to be COVID positive, though exhibited no signs of COVID-19 illness.  She is fully vaccinated, and her son thinks she also got a booster.    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 There are also lateralized periodic epileptiform discharges, left hemisphere, maximal left centro-parietal region suggestive of underlying structural abnormality  Significant Diagnostic Tests:   MRI 1/7: 1. Abnormal T2/FLAIR signal intensity involving the left insula and subinsular region,  with extension to involve the mesial and anterior left temporal lobe, including the left hippocampus. Similar but less pronounced changes involve the right temporal lobe and insular cortex. Given provided history, finding is highly concerning for possible acute encephalitis, with herpes encephalitis a high consideration given the location of these findings.   MRI brain 1/9>>evolution of prior abnormal signal with decreased diffusion and increased vasogenic edema.  Micro Data:  CSF: protein 68, glucose 82, WBCs present. 5-9 WCs, lymphs  - Crypto Ag >>neg - HSV PCR >> - VZV PCR >> - CSF cx neg  SARS COV2 positive  Antimicrobials:  Vancomycin-1 dose 1/7 Ceftriaxone-1 dose 1/7 Ampicillin-one dose 1/7 Unasyn 1/8>> Acyclovir 1/7 >>  Interim History / Subjective:   No significant overnight events  Objective   Blood pressure 125/62, pulse 83, temperature 98.4 F (36.9 C), temperature source Axillary, resp. rate (!) 22, height 5\' 4"  (1.626 m), weight 54.5 kg, SpO2 97 %.        Intake/Output Summary (Last 24 hours) at 12/31/2020 0843 Last data filed at 12/31/2020 0800 Gross per 24 hour  Intake 2179.14 ml  Output 740 ml  Net 1439.14 ml   Filed Weights   January 08, 2021 2300 12/29/20 0733 12/31/20 0439  Weight: 53.7 kg 53.1 kg 54.5 kg    Examination: General: awake, in no acute distress HENT: dry MM Lungs: breathing comfortably on [place]. Crackles on the right Cardiovascular: regular rate and rhythm Abdomen: soft, nontender, nondistended Extremities: warm, no edema Neuro:  Awake and alert. Does not follow commands. PERRL. Withdraws to noxious stimuli in all four  extremities. Global aphasia GU: no abnormalities  Resolved Hospital Problem list     Assessment & Plan:  # Acute respiratory failure. Down to 10L [place] this morning. CXR shows worsened bibasilar atelectasis. # Aspiration pneumonia #COVID 19 + P: -continue unasyn -Solu-Medrol for Covid pneumonia. -maintain euvolemia  #  Acute encephalopathy, concern for encephalitis on MRI:   Cryptococcus Ag neg # Suspected seizure P: -LTM -Await CSF studies:  HSV, VZV PCR, and fungal culture - continue acyclovir, unasyn -Keppra, depakote per neurology -place cortrak to start nutrition  Hypokalemia--replete  Hypoglycemia--resolved. D/c D10 Steroid induced hyperglycemia--SSI  Chronic medical problems: # history of diffuse large B cell lymphoma # CHF, mildly reduced EF in 2015   Best practice (evaluated daily)  Diet: NPO, placing cortrak Pain/Anxiety/Delirium protocol (if indicated): n/a VAP protocol (if indicated): n/a DVT prophylaxis: lovenox GI prophylaxis: n/a Glucose control: SSI Mobility: PT Disposition: ICU  Goals of Care:  Last date of multidisciplinary goals of care discussion: 1/8 Family and staff present:  son [name] Summary of discussion: code status is full code Follow up goals of care discussion due: son bryant updated 1/9 Code Status: Full  Labs   CBC: Recent Labs  Lab January 02, 2021 2214 01/02/2021 2219 12/30/20 0831  WBC  --  15.1* 14.8*  NEUTROABS  --  12.6*  --   HGB 14.3 14.2 11.8*  HCT 42.0 44.1 37.7  MCV  --  92.1 93.8  PLT  --  225 834    Basic Metabolic Panel: Recent Labs  Lab 2021-01-02 2214 01-02-2021 2219 12/30/20 0831 12/30/20 1250  NA 143 140 143 144  K 3.4* 3.5 3.1* 3.4*  CL 104 104 110 110  CO2  --  24 24 22   GLUCOSE 135* 140* 137* 197*  BUN 39* 38* 36* 36*  CREATININE 0.80 0.87 0.93 0.92  CALCIUM  --  8.7* 7.7* 7.9*  MG  --   --  2.5*  --   PHOS  --   --  1.9*  --    GFR: Estimated Creatinine Clearance: 45.5 mL/min (by C-G formula based on SCr of 0.92 mg/dL). Recent Labs  Lab 01-02-2021 2219 12/29/20 0338 12/29/20 0609 12/29/20 1331 12/30/20 0831  PROCALCITON  --   --  <0.10  --   --   WBC 15.1*  --   --   --  14.8*  LATICACIDVEN  --  2.3*  --  2.3*  --     Liver Function Tests: Recent Labs  Lab 01-02-21 2219  AST 30  ALT 21  ALKPHOS 50   BILITOT 0.8  PROT 7.0  ALBUMIN 3.7   No results for input(s): LIPASE, AMYLASE in the last 168 hours. No results for input(s): AMMONIA in the last 168 hours.  ABG    Component Value Date/Time   TCO2 25 01-02-2021 2214     Critical care time: 40 min     This patient is critically ill with acute hypoxic respiratory failure; which, requires frequent high complexity decision making, assessment, support, evaluation, and titration of therapies. This was completed through the application of advanced monitoring technologies and extensive interpretation of multiple databases. During this encounter critical care time was devoted to patient care services described in this note for 35 minutes.

## 2020-12-31 NOTE — Procedures (Signed)
Cortrak  Person Inserting Tube:  Rosezetta Schlatter, RD Tube Type:  Cortrak - 43 inches Tube Location:  Right nare Initial Placement:  Stomach Secured by: Bridle Technique Used to Measure Tube Placement:  Documented cm marking at nare/ corner of mouth Cortrak Secured At:  67 cm Procedure Comments:  Cortrak Tube Team Note:  Consult received to place a Cortrak feeding tube.   No x-ray is required. RN may begin using tube.   If the tube becomes dislodged please keep the tube and contact the Cortrak team at [email or web address] ([email or web address]) for replacement.  If after hours and replacement cannot be delayed, place a NG tube and confirm placement with an abdominal x-ray.      Jarome Matin, MS, RD, LDN, CNSC Inpatient Clinical Dietitian RD pager # available in [place]  After hours/weekend pager # available in [facility]

## 2020-12-31 NOTE — Progress Notes (Signed)
NEUROLOGY CONSULTATION PROGRESS NOTE   Date of service: December 31, 2020 Patient Name: Tara Sloan MRN:  [id number] DOB:  Feb 17, 1946  Brief HPI  Tara Sloan is a 75 y.o. female presenting with acute onset slurred speech and aphasia over the course of a day(LKW 1/6). MRI brain with T2 flair hyperintensity in the left insula, subinsular region with extension into the mesial and anterior left temporal lobe and left hippocampus.  Also noted to have similar but less pronounced changes on the right temporal lobe and insula.  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  Work-up with LP with CSF profile on tube 4 with mild pleocytosis, mildly elevated protein to 68. HSV and VZV PCR and IgM and IgG results are still pending.   Interval Hx   Continues to be encephalopathic. HSV and VZV PCR pending.  Vitals   Vitals:   12/31/20 0600 12/31/20 0700 12/31/20 0737 12/31/20 0800  BP: (!) 108/56 (!) 112/58  125/62  Pulse: 75 77 76 83  Resp: 20 (!) 25 (!) 24 (!) 22  Temp:    98.4 F (36.9 C)  TempSrc:    Axillary  SpO2: 98% 97% 98% 97%  Weight:      Height:       Body mass index is 20.62 kg/m.  Physical Exam   General: Laying comfortably in bed; in no acute distress. HENT: Normal oropharynx and mucosa. Normal external appearance of ears and nose. Neck: Supple, no pain or tenderness CV: No JVD. No peripheral edema. Pulmonary: Symmetric Chest rise. Normal respiratory effort. Abdomen: Soft to touch, non-tender. Ext: No cyanosis, edema, or deformity Skin: No rash. Normal palpation of skin.  Musculoskeletal: Normal digits and nails by inspection. No clubbing.  Neurologic Examination  MS: opens eyes to voice, attempts to follow but not consistently following. Non verbal Cranial nerves: Pupils equal round react to light, extract movements intact, visual fields full to threat, face-mild asymmetry with right nasolabial fold flattening and mild twitching of the whole face. Motor  exam: She is able to keep both her upper extremities antigravity.  She is also able to raise her lower extremities antigravity but has poor attention concentration to keep them up for 5 seconds. Sensation: Intact to touch as evidenced by grimacing and withdrawal Coordination difficult to assess given her current mentation  Labs   Basic Metabolic Panel:  Lab Results  Component Value Date   NA 144 12/30/2020   K 3.4 (L) 12/30/2020   CO2 22 12/30/2020   GLUCOSE 197 (H) 12/30/2020   BUN 36 (H) 12/30/2020   CREATININE 0.92 12/30/2020   CALCIUM 7.9 (L) 12/30/2020   GFRNONAA >60 12/30/2020   GFRAA >60 05/28/2018  Urine Drug Screen:     Component Value Date/Time   LABOPIA NONE DETECTED 12/29/2020 1459   COCAINSCRNUR NONE DETECTED 12/29/2020 1459   LABBENZ NONE DETECTED 12/29/2020 1459   AMPHETMU NONE DETECTED 12/29/2020 1459   THCU NONE DETECTED 12/29/2020 1459   LABBARB NONE DETECTED 12/29/2020 1459    VPA level 38  Imaging and Diagnostic studies    MRI Brain without contrast: 12/29/2020  IMPRESSION: 1. Abnormal T2/FLAIR signal intensity involving the left insula and subinsular region, with extension to involve the mesial and anterior left temporal lobe, including the left hippocampus. Similar but less pronounced changes involve the right temporal lobe and insular cortex. Given provided history, finding is highly concerning for possible acute encephalitis, with herpes encephalitis a high consideration given the location of these findings. Correlation with  CSF values recommended. Primary differential considerations include an alternate nonspecific meningoencephalitis versus seizure. 2. No other acute intracranial abnormality. 3. Underlying chronic microvascular ischemic disease.  MR brain with and without contrast 12/30/2020 IMPRESSION: Some evolution of prior abnormal signal with decreased reduced diffusion and increased vasogenic edema. No corresponding enhancement or  hemorrhage. Differential considerations are unchanged and there may be some component of seizure effect.   Impression   Tara Sloan is a 75 y.o. female presenting with acute onset slurred speech and aphasia over the course of a day(LKW 1/6). MRI brain with T2 flair hyperintensity in the left insula, subinsular region with extension into the mesial and anterior left temporal lobe and left hippocampus.  Also noted to have similar but less pronounced changes on the right temporal lobe and insula.  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 The Prelim CSF studies so far are not concerning for a bacterial meningitis and thus Vanc, ceftriaxone were discontinued. She is also being treated for aspiration Pneumonia with Ampicllin.  Impression: HSV encephalitis Seizure Aspiration pneumonia  Recommendations  - continue Acyclovir - HSV and VZV PCR in CSF and serum IgM and IgG results are still pending. - Continue Keppra 1000 BID - cEEG - Depakote levl low. Will give additional load and continue 500 BID. Appreciate pharmacy assistance. - Seizure precautions.  Discussed with the ICU team on the unit.  -- Amie Portland, MD Neurologist [facility] Pager: [phone]    CRITICAL CARE ATTESTATION Performed by: Amie Portland, MD Total critical care time: 31 minutes Critical care time was exclusive of separately billable procedures and treating other patients and/or supervising APPs/Residents/Students Critical care was necessary to treat or prevent imminent or life-threatening deterioration due to HSV encephalitis, seizures This patient is critically ill and at significant risk for neurological worsening and/or death and care requires constant monitoring. Critical care was time spent personally by me on the following activities: development  of treatment plan with patient and/or surrogate as well as nursing, discussions with consultants, evaluation of patient's response to treatment, examination of patient, obtaining history from patient or surrogate, ordering and performing treatments and interventions, ordering and review of laboratory studies, ordering and review of radiographic studies, pulse oximetry, re-evaluation of patient's condition, participation in multidisciplinary rounds and medical decision making of high complexity in the care of this patient.

## 2020-12-31 NOTE — Progress Notes (Addendum)
Initial Nutrition Assessment  DOCUMENTATION CODES:   Not applicable  INTERVENTION:   Initiate tube feeding via Cortrak tube: Osmolite 1.2 at 55 ml/h (1320 ml per day)  Provides 1584 kcal, 73 gm protein, 1070 ml free water daily   NUTRITION DIAGNOSIS:   Inadequate oral intake related to inability to eat as evidenced by NPO status.  GOAL:   Patient will meet greater than or equal to 90% of their needs  MONITOR:   TF tolerance,I & O's  REASON FOR ASSESSMENT:   Rounds    ASSESSMENT:   Pt with PMH of diffuse large B cell lymphoma in remission, GERD, HLD, and CHF admitted with encephalopathy MRI concerning for encephalitis, aspiration PNA, and COVID+.  Pt discussed during ICU rounds and with RN. Per RN pt requiring HFNC 4-15 L O2.   1/10 cortrak placed; tip gastric   Neurology following, EEG previously showed seizures now on keppra.     Medications reviewed and include: decadron, SSI Labs reviewed: K+ 3.3 CBG's: 140-174   Diet Order:   Diet Order            Diet NPO time specified  Diet effective now                 EDUCATION NEEDS:   No education needs have been identified at this time  Skin:  Skin Assessment: Reviewed RN Assessment  Last BM:  unknown  Height:   Ht Readings from Last 1 Encounters:  12/29/20 5\' 4"  (1.626 m)    Weight:   Wt Readings from Last 1 Encounters:  12/31/20 54.5 kg    Ideal Body Weight:  54.4 kg  BMI:  Body mass index is 20.62 kg/m.  Estimated Nutritional Needs:   Kcal:  1400-1600  Protein:  65-80 grams  Fluid:  >1.5 L/day  Lockie Pares., RD, LDN, CNSC See AMiON for contact information

## 2020-12-31 NOTE — Progress Notes (Addendum)
LTM EEG hooked up and running - no initial skin breakdown - push button tested - neuro notified.  

## 2020-12-31 NOTE — Progress Notes (Signed)
Pharmacy Antibiotic Note  Tara Sloan is a 75 y.o. female admitted on 2021-01-19 with herpes encephalitis.  Pharmacy helping dose for Acyclovir. Given increase in WBC and improvement in SCr, will plan to increase acyclovir dose   Plan: Increase Acyclovir to 535 mg (10mg /kg) q8h Will f/u renal function, micro data, and pt's clinical condition  Height: 5\' 4"  (162.6 cm) Weight: 54.5 kg (120 lb 2.4 oz) IBW/kg (Calculated) : 54.7  Temp (24hrs), Avg:99.2 F (37.3 C), Min:97.7 F (36.5 C), Max:101 F (38.3 C)  Recent Labs  Lab Jan 19, 2021 2214 01/19/21 2219 12/29/20 0338 12/29/20 1331 12/30/20 0831 12/30/20 1250 12/31/20 0832  WBC  --  15.1*  --   --  14.8*  --  16.1*  CREATININE 0.80 0.87  --   --  0.93 0.92 0.78  LATICACIDVEN  --   --  2.3* 2.3*  --   --   --     Estimated Creatinine Clearance: 52.3 mL/min (by C-G formula based on SCr of 0.78 mg/dL).    Allergies  Allergen Reactions  . Pnu-Imune [Pneumococcal Polysaccharide Vaccine] Hives    Antibiotics   1/8 vanc x 1 1/8 Acyclovir >>  1/8 Ceftriaxone x 1 1/8 amp x 1 1/8 unasyn >>  Cultures   1/8 covid+ (unclear if incidental or not per CCM) 1/8 BCx: ngtd 1/8 CSF cx: ngtd 1/8 CSF cell ct - not consistent w/ bacterial meningitis 1/8 HSV pcr - ip 1/8 crypto ant - neg 1/8 VZV - ip 1/8 UC - negF  1/8 mrsa pcr - neg 1/8 resp panel - neg 1/7 BCx - pending   Thank you for allowing pharmacy to be a part of this patient's care.  Albertina Parr, PharmD., BCPS, BCCCP Clinical Pharmacist Please refer to [facility] for unit-specific pharmacist

## 2020-12-31 NOTE — Progress Notes (Signed)
[place] Progress Note Patient Name: Tara Sloan DOB: 1946-10-12 MRN: [id number]   Date of Service  12/31/2020  HPI/Events of Note  Hypophosphatemia  Hypokalemia - K+ = 3.3, PO4--- < 1.0 and Creatinine = 0.76.  eICU Interventions  Will replace K+ and PO4---.     Intervention Category Major Interventions: Electrolyte abnormality - evaluation and management  Corbet Hanley [place] 12/31/2020, 9:45 PM

## 2020-12-31 NOTE — Progress Notes (Signed)
Discussed low UOP with MD R. Agarwala during AM rounds; paged [phone] regarding continued low UOP, orders received for 5102ml NS bolus.

## 2021-01-01 DIAGNOSIS — G9340 Encephalopathy, unspecified: Secondary | ICD-10-CM | POA: Diagnosis not present

## 2021-01-01 DIAGNOSIS — U071 COVID-19: Secondary | ICD-10-CM | POA: Diagnosis not present

## 2021-01-01 DIAGNOSIS — J9601 Acute respiratory failure with hypoxia: Secondary | ICD-10-CM | POA: Diagnosis not present

## 2021-01-01 LAB — MAGNESIUM
Magnesium: 2.2 mg/dL (ref 1.7–2.4)
Magnesium: 2.2 mg/dL (ref 1.7–2.4)

## 2021-01-01 LAB — COMPREHENSIVE METABOLIC PANEL
ALT: 63 U/L — ABNORMAL HIGH (ref 0–44)
AST: 73 U/L — ABNORMAL HIGH (ref 15–41)
Albumin: 1.9 g/dL — ABNORMAL LOW (ref 3.5–5.0)
Alkaline Phosphatase: 45 U/L (ref 38–126)
Anion gap: 11 (ref 5–15)
BUN: 19 mg/dL (ref 8–23)
CO2: 21 mmol/L — ABNORMAL LOW (ref 22–32)
Calcium: 7.4 mg/dL — ABNORMAL LOW (ref 8.9–10.3)
Chloride: 112 mmol/L — ABNORMAL HIGH (ref 98–111)
Creatinine, Ser: 0.74 mg/dL (ref 0.44–1.00)
GFR, Estimated: >60 mL/min (ref >60)
Glucose, Bld: 132 mg/dL — ABNORMAL HIGH (ref 70–99)
Potassium: 3.6 mmol/L (ref 3.5–5.1)
Sodium: 144 mmol/L (ref 135–145)
Total Bilirubin: 0.6 mg/dL (ref 0.3–1.2)
Total Protein: 5.2 g/dL — ABNORMAL LOW (ref 6.5–8.1)

## 2021-01-01 LAB — CSF CULTURE W GRAM STAIN
Culture: NO GROWTH
Special Requests: NORMAL

## 2021-01-01 LAB — CBC
HCT: 37.7 % (ref 36.0–46.0)
Hemoglobin: 12.9 g/dL (ref 12.0–15.0)
MCH: 29.9 pg (ref 26.0–34.0)
MCHC: 34.2 g/dL (ref 30.0–36.0)
MCV: 87.5 fL (ref 80.0–100.0)
Platelets: 206 10*3/uL (ref 150–400)
RBC: 4.31 MIL/uL (ref 3.87–5.11)
RDW: 14.7 % (ref 11.5–15.5)
WBC: 15.5 10*3/uL — ABNORMAL HIGH (ref 4.0–10.5)
nRBC: 0 % (ref 0.0–0.2)

## 2021-01-01 LAB — GLUCOSE, CAPILLARY
Glucose-Capillary: 195 mg/dL — ABNORMAL HIGH (ref 70–99)
Glucose-Capillary: 154 mg/dL — ABNORMAL HIGH (ref 70–99)
Glucose-Capillary: 161 mg/dL — ABNORMAL HIGH (ref 70–99)
Glucose-Capillary: 67 mg/dL — ABNORMAL LOW (ref 70–99)
Glucose-Capillary: 150 mg/dL — ABNORMAL HIGH (ref 70–99)
Glucose-Capillary: 99 mg/dL (ref 70–99)
Glucose-Capillary: 102 mg/dL — ABNORMAL HIGH (ref 70–99)

## 2021-01-01 LAB — HSV 1/2 PCR, CSF
HSV-1 DNA: POSITIVE
HSV-2 DNA: NEGATIVE

## 2021-01-01 LAB — VARICELLA-ZOSTER BY PCR: Varicella-Zoster, PCR: NEGATIVE

## 2021-01-01 LAB — PHOSPHORUS
Phosphorus: 1.4 mg/dL — ABNORMAL LOW (ref 2.5–4.6)
Phosphorus: 3.4 mg/dL (ref 2.5–4.6)

## 2021-01-01 LAB — HSV(HERPES SIMPLEX VRS) I + II AB-IGM: HSVI/II Comb IgM: <0.91 Ratio (ref 0.00–0.90)

## 2021-01-01 MED ORDER — DEXTROSE 50 % IV SOLN: 12.5000 g | INTRAVENOUS | Status: AC

## 2021-01-01 MED ORDER — LEVETIRACETAM IN NACL 1500 MG/100ML IV SOLN
1500.0000 mg | Freq: Two times a day (BID) | INTRAVENOUS | Status: DC
  Administered 2021-01-01 – 2021-01-04 (×6): 1500 mg via INTRAVENOUS
  Filled 2021-01-01 (×6): qty 100

## 2021-01-01 MED ORDER — DEXTROSE 50 % IV SOLN
INTRAVENOUS | Status: AC
  Administered 2021-01-01: 12.5 g via INTRAVENOUS
  Filled 2021-01-01: qty 50

## 2021-01-01 MED ORDER — DOCUSATE SODIUM 50 MG/5ML PO LIQD: 100.0000 mg | Freq: Two times a day (BID) | ORAL | Status: DC | PRN

## 2021-01-01 MED ORDER — POTASSIUM PHOSPHATES 15 MMOLE/5ML IV SOLN
40.0000 mmol | Freq: Once | INTRAVENOUS | Status: AC
  Administered 2021-01-01: 40 mmol via INTRAVENOUS
  Filled 2021-01-01: qty 13.33

## 2021-01-01 MED ORDER — SODIUM CHLORIDE 0.9% FLUSH
10.0000 mL | Freq: Two times a day (BID) | INTRAVENOUS | Status: DC
  Administered 2021-01-01: 10 mL
  Administered 2021-01-02: 30 mL
  Administered 2021-01-02 – 2021-01-03 (×3): 10 mL

## 2021-01-01 MED ORDER — SODIUM CHLORIDE 0.9% FLUSH
10.0000 mL | INTRAVENOUS | Status: DC | PRN
Start: 2021-01-01 — End: 2021-01-04

## 2021-01-01 MED ORDER — POLYETHYLENE GLYCOL 3350 17 G PO PACK
17.0000 g | PACK | Freq: Every day | ORAL | Status: DC | PRN
Start: 2021-01-01 — End: 2021-01-04

## 2021-01-01 NOTE — Progress Notes (Signed)
Patient's primary contact for updates is son Heath Lark. [phone]

## 2021-01-01 NOTE — Progress Notes (Signed)
LTM maintenance completed; reprepped under Cz, Ref, ground, T8, T7, P7, P8, A1, C3, C4, and F3. No skin breakdown was seen.

## 2021-01-01 NOTE — Progress Notes (Addendum)
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  I gathered all of the history from [name] at bedside.  Patient started not acting quite herself on Wednesday, but still was conversational.  When her son saw her Friday night around 6pm, she was slurring words and they called EMS.  On arrival to the ED, she was only able to follow directions intermittently, and was on room air.  Nonfocal exam.  She progressed to not being able to follow any directions, but she is responsive to noxious stimuli.  LP was done in the ED after Code stroke scans were negative but MRI showed possible encephalitis.  She has had increasing O2 requirements and upper airway gurgling, though no gross aspiration event was noted.  Because of concern for airway protection, hospitalist called ICU for admission.    She was found to be COVID positive, though exhibited no signs of COVID-19 illness.  She is fully vaccinated, and her son thinks she also got a booster.    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 There are also lateralized periodic epileptiform discharges, left hemisphere, maximal left centro-parietal region suggestive of underlying structural abnormality  Significant Diagnostic Tests:   MRI 1/7: 1. Abnormal T2/FLAIR signal intensity involving the left insula and subinsular region,  with extension to involve the mesial and anterior left temporal lobe, including the left hippocampus. Similar but less pronounced changes involve the right temporal lobe and insular cortex. Given provided history, finding is highly concerning for possible acute encephalitis, with herpes encephalitis a high consideration given the location of these findings.   MRI brain 1/9>>evolution of prior abnormal signal with decreased diffusion and increased vasogenic edema.  Micro Data:  CSF: protein 68, glucose 82, WBCs present. 5-9 WCs, lymphs  - Crypto Ag >>neg - HSV PCR >> - VZV PCR >>negative - CSF cx neg  SARS COV2 positive  Antimicrobials:  Vancomycin-1 dose 1/7 Ceftriaxone-1 dose 1/7 Ampicillin-one dose 1/7 Unasyn 1/8>> Acyclovir 1/7 >>  Interim History / Subjective:   No significant overnight events  Objective   Blood pressure (!) 164/79, pulse 86, temperature 99 F (37.2 C), temperature source Axillary, resp. rate (!) 28, height 5\' 4"  (1.626 m), weight 59 kg, SpO2 97 %.        Intake/Output Summary (Last 24 hours) at 01/01/2021 0914 Last data filed at 01/01/2021 0900 Gross per 24 hour  Intake 4957.93 ml  Output 620 ml  Net 4337.93 ml   Filed Weights   12/29/20 0733 12/31/20 0439 01/01/21 0438  Weight: 53.1 kg 54.5 kg 59 kg    Examination: General: awake, in no acute distress HENT: EEG leads Lungs: breathing comfortably on 6L. Significant rhonchi auscultated bilaterally Cardiovascular: regular rate and rhythm Abdomen: soft, nontender, nondistended Extremities: cool, no edema Neuro:  More lethargic this morning compared to yesterday. Does not follow commands.  Moves all  extremities. Global aphasia GU: foley  Resolved Hospital Problem list     Assessment & Plan:  # Acute hypoxic respiratory failure. Down to 6L [place] this morning however she has developed bilateral rhonchi on exam today. She is not following commands and did not cough to clear her airway while I was in  the room which makes me concerned regarding her ability to protect her airway. # Hx of HFmrEF. Her last echo was in 2016 which showed a mrEF 45%. Wt is up 6kg since admission.The remainder of her exam is not consistent with hypervolemia however would consider repeating an echo.  # Aspiration pneumonia. Afebrile overnight. Decreasing supplemental O2 needs #COVID 19 +. Do not suspect this to be cause of her sx P: -continue unasyn -Solu-Medrol for Covid pneumonia. -maintain euvolemia -may need to diurese today  # Acute encephalopathy, concern for encephalitis on MRI:   Cryptococcus Ag neg. HSV 1 IgG is elevated. HSV PCR in process # Encephalitis induced epilepsy P: -LTM -Await CSF studies:  HSV, VZV PCR, and fungal culture -Continue acyclovir, unasyn -Keppra, depakote per neurology -check ammonia level  Steroid induced hyperglycemia--SSI High risk Malnutrition--continue tube feeds (started 1/10) Urinary Retention--noted on 1/8 for which foley was placed. UOP was poor and bladder scan showed minimal urine yesterday despite receiving 5L of fluids so I checked a renal US which is unremarkable. Hypokalemia--daily BMP, Magnesium, Phos  Chronic medical problems: # history of diffuse large B cell lymphoma # CHF, mildly reduced EF in 2015   Best practice (evaluated daily)  Diet: NPO, cortrak Pain/Anxiety/Delirium protocol (if indicated): n/a VAP protocol (if indicated): n/a DVT prophylaxis: lovenox GI prophylaxis: n/a Glucose control: SSI Mobility: PT Disposition: ICU  Goals of Care:  Last date of multidisciplinary goals of care discussion: 1/8 Family and staff present:  [name] Summary of discussion: code status is full code Follow up goals of care discussion due: son bryant updated 1/9 Code Status: Full  Labs   CBC: Recent Labs  Lab 01/11/2021 2214 01/02/2021 2219 12/30/20 0831 12/31/20 0832  WBC  --  15.1* 14.8* 16.1*  NEUTROABS  --  12.6*  --   --   HGB 14.3 14.2 11.8*  12.9  HCT 42.0 44.1 37.7 40.1  MCV  --  92.1 93.8 90.7  PLT  --  225 168 0000000    Basic Metabolic Panel: Recent Labs  Lab 01/21/2021 2214 01/12/2021 2219 12/30/20 0831 12/30/20 1250 12/31/20 0832 12/31/20 1837  NA 143 140 143 144 142  --   K 3.4* 3.5 3.1* 3.4* 3.3*  --   CL 104 104 110 110 107  --   CO2  --  24 24 22  21*  --   GLUCOSE 135* 140* 137* 197* 170*  --   BUN 39* 38* 36* 36* 28*  --   CREATININE 0.80 0.87 0.93 0.92 0.78  --   CALCIUM  --  8.7* 7.7* 7.9* 7.6*  --   MG  --   --  2.5*  --   --  2.4  PHOS  --   --  1.9*  --   --  <1.0*   GFR: Estimated Creatinine Clearance: 52.5 mL/min (by C-G formula based on SCr of 0.78 mg/dL). Recent Labs  Lab 01/21/2021 2219 12/29/20 0338 12/29/20 0609 12/29/20 1331 12/30/20 0831 12/31/20 0832  PROCALCITON  --   --  <0.10  --   --   --   WBC 15.1*  --   --   --  14.8* 16.1*  LATICACIDVEN  --  2.3*  --  2.3*  --   --     Liver Function Tests: Recent Labs  Lab 12/27/2020 2219  AST 30  ALT 21  ALKPHOS 50  BILITOT 0.8  PROT 7.0  ALBUMIN 3.7   No results for input(s): LIPASE, AMYLASE in the last 168 hours. No results for input(s): AMMONIA in the last 168 hours.  ABG    Component Value Date/Time   TCO2 25 12/30/2020 2214     Mitzi Hansen, MD Internal Medicine Resident PGY-2 [facility] Pager: [phone] 01/01/2021 10:03 AM

## 2021-01-01 NOTE — Progress Notes (Signed)
Received call from lab stating AM labs need to be repeated.   11:40 AM Repeat labs still not done. Phleb called and made aware, will be to unit shortly.

## 2021-01-01 NOTE — Plan of Care (Addendum)
  Problem: Respiratory: Goal: Will maintain a patent airway Outcome: Progressing    Patient sating 95-100% on 6L [place]. Patient switched to 3L O2 via [place], spo2 98% at present.  Rhonchi bilat lungs. CPT continues per schedule.  RR even and unlabored.

## 2021-01-01 NOTE — Progress Notes (Signed)
Peripherally Inserted Central Catheter Placement  The IV Nurse has discussed with the patient and/or persons authorized to consent for the patient, the purpose of this procedure and the potential benefits and risks involved with this procedure.  The benefits include less needle sticks, lab draws from the catheter, and the patient may be discharged home with the catheter. Risks include, but not limited to, infection, bleeding, blood clot (thrombus formation), and puncture of an artery; nerve damage and irregular heartbeat and possibility to perform a PICC exchange if needed/ordered by physician.  Alternatives to this procedure were also discussed.  Bard Power PICC patient education guide, fact sheet on infection prevention and patient information card has been provided to patient /or left at bedside.    PICC Placement Documentation  PICC Double Lumen 89/21/19 PICC Right Basilic 40 cm 0 cm (Active)  Indication for Insertion or Continuance of Line Prolonged intravenous therapies 01/01/21 1752  Exposed Catheter (cm) 0 cm 01/01/21 1752  Site Assessment Clean;Dry;Intact 01/01/21 1752  Lumen #1 Status Flushed;Blood return noted 01/01/21 1752  Lumen #2 Status Flushed;Blood return noted 01/01/21 1752  Dressing Type Transparent 01/01/21 1752  Dressing Status Clean;Dry;Intact 01/01/21 1752  Antimicrobial disc in place? Yes 01/01/21 1752  Safety Lock Not Applicable 41/74/08 1448  Dressing Intervention New dressing;Other (Comment) 01/01/21 1752   Telephone consent signed by son    Synthia Innocent 01/01/2021, 5:54 PM

## 2021-01-01 NOTE — Progress Notes (Signed)
NEUROLOGY CONSULTATION PROGRESS NOTE   Date of service: January 01, 2021 Patient Name: Tara Sloan MRN:  [id number] DOB:  [birth year]  Brief HPI  Tara Sloan is a 75 y.o. female presenting with acute onset slurred speech and aphasia over the course of a day(LKW 1/6). MRI brain with T2 flair hyperintensity in the left insula, subinsular region with extension into the mesial and anterior left temporal lobe and left hippocampus.  Also noted to have similar but less pronounced changes on the right temporal lobe and insula.  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  Work-up with LP with CSF profile on tube 4 with mild pleocytosis, mildly elevated protein to 68. HSV and VZV PCR and IgM and IgG results are still pending.   Interval Hx   Continues to be encephalopathic. HSV and VZV PCR pending.  Vitals   Vitals:   01/01/21 0600 01/01/21 0700 01/01/21 0800 01/01/21 0900  BP: 122/70 139/70 136/72 (!) 164/79  Pulse: 68 78 79 86  Resp: (!) 23 (!) 26 (!) 25 (!) 28  Temp:   99 F (37.2 C)   TempSrc:   Axillary   SpO2: 98% 98% (!) 88% 97%  Weight:      Height:       Body mass index is 22.33 kg/m.  Physical Exam   General: Laying comfortably in bed; in no acute distress. HENT: Normal oropharynx and mucosa. Normal external appearance of ears and nose. Neck: Supple, no pain or tenderness CV: No JVD. No peripheral edema. Pulmonary: Symmetric Chest rise. Normal respiratory effort. Abdomen: Soft to touch, non-tender. Ext: No cyanosis, edema, or deformity Skin: No rash. Normal palpation of skin.  Musculoskeletal: Normal digits and nails by inspection. No clubbing.  Neurologic Examination  MS: opens eyes to voice, attempts to follow but not consistently following. Non verbal Cranial nerves: Pupils equal round react to light, extract movements intact, visual fields full to threat, face-mild asymmetry with right nasolabial fold flattening and mild twitching of the whole  face. Motor exam: She is able to keep both her upper extremities antigravity.  She is also able to raise her lower extremities antigravity but has poor attention concentration to keep them up for 5 seconds. Sensation: Intact to touch as evidenced by grimacing and withdrawal Coordination difficult to assess given her current mentation Exam is unchanged from yesterday  Labs   Basic Metabolic Panel:  Lab Results  Component Value Date   NA 142 12/31/2020   K 3.3 (L) 12/31/2020   CO2 21 (L) 12/31/2020   GLUCOSE 170 (H) 12/31/2020   BUN 28 (H) 12/31/2020   CREATININE 0.78 12/31/2020   CALCIUM 7.6 (L) 12/31/2020   GFRNONAA >60 12/31/2020   GFRAA >60 05/28/2018  Urine Drug Screen:     Component Value Date/Time   LABOPIA NONE DETECTED 12/29/2020 1459   COCAINSCRNUR NONE DETECTED 12/29/2020 1459   LABBENZ NONE DETECTED 12/29/2020 1459   AMPHETMU NONE DETECTED 12/29/2020 1459   THCU NONE DETECTED 12/29/2020 1459   LABBARB NONE DETECTED 12/29/2020 1459    VPA level 38  Overnight EEG showed epileptogenicity in the left hemisphere, maximal left central parietal region due to underlying structural abnormality.  The lateralized periodic epileptiform discharges at times appear rhythmic which is on the ictal interictal continuum.  Additionally moderate diffuse encephalopathy nonspecific etiology but likely related to seizure and underlying structural abnormality.  Imaging and Diagnostic studies    MRI Brain without contrast: 12/29/2020  IMPRESSION: 1. Abnormal T2/FLAIR signal intensity involving  the left insula and subinsular region, with extension to involve the mesial and anterior left temporal lobe, including the left hippocampus. Similar but less pronounced changes involve the right temporal lobe and insular cortex. Given provided history, finding is highly concerning for possible acute encephalitis, with herpes encephalitis a high consideration given the location of these findings.  Correlation with CSF values recommended. Primary differential considerations include an alternate nonspecific meningoencephalitis versus seizure. 2. No other acute intracranial abnormality. 3. Underlying chronic microvascular ischemic disease.  MR brain with and without contrast 12/30/2020 IMPRESSION: Some evolution of prior abnormal signal with decreased reduced diffusion and increased vasogenic edema. No corresponding enhancement or hemorrhage. Differential considerations are unchanged and there may be some component of seizure effect.   Impression   Tara Sloan is a 75 y.o. female presenting with acute onset slurred speech and aphasia over the course of a day(LKW 1/6). MRI brain with T2 flair hyperintensity in the left insula, subinsular region with extension into the mesial and anterior left temporal lobe and left hippocampus.  Also noted to have similar but less pronounced changes on the right temporal lobe and insula.  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 The Prelim CSF studies so far are not concerning for a bacterial meningitis and thus Vanc, ceftriaxone were discontinued. She is also being treated for aspiration Pneumonia with Ampicllin.  EEG with continued left-sided epileptogenicity and lateralized periodic epileptiform discharges which at times are rhythmic concerning for being on the ictal interictal continuum.  Impression: HSV encephalitis Seizure Aspiration pneumonia  Recommendations  - continue Acyclovir - HSV and VZV PCR in CSF and serum IgM and IgG results are still pending. -Increase Keppra to 1500 twice daily. - Depakote levl low yesterday, supplemented. Appreciate pharmacy assistance.  Recheck levels in the a.m. - Seizure precautions. -Continue LTM  Treatment of COVID infection and other  comorbidities per primary team as you are.  Discussed with the ICU team on the unit.  -- Amie Portland, MD Neurologist [facility] Pager: [phone]   CRITICAL CARE ATTESTATION Performed by: Amie Portland, MD Total critical care time: 30 minutes Critical care time was exclusive of separately billable procedures and treating other patients and/or supervising APPs/Residents/Students Critical care was necessary to treat or prevent imminent or life-threatening deterioration due to HSV encephalitis, seizures This patient is critically ill and at significant risk for neurological worsening and/or death and care requires constant monitoring. Critical care was time spent personally by me on the following activities: development of treatment plan with patient and/or surrogate as well as nursing, discussions with consultants, evaluation of patient's response to treatment, examination of patient, obtaining history from patient or surrogate, ordering and performing treatments and interventions, ordering and review of laboratory studies, ordering and review of radiographic studies, pulse oximetry, re-evaluation of patient's condition, participation in multidisciplinary rounds and medical decision making of high complexity in the care of this patient.

## 2021-01-01 NOTE — Progress Notes (Signed)
[place] Progress Note Patient Name: Tara Sloan DOB: 1946-07-18 MRN: [id number]   Date of Service  01/01/2021  HPI/Events of Note  CSF culture positive for HSV by verbal lab report. I do not see an entry in Epic yet. Patient is already on Acyclovir.  eICU Interventions  Continue present management.      Intervention Category Major Interventions: Infection - evaluation and management  Sharise Lippy Eugene 01/01/2021, 9:26 PM

## 2021-01-01 NOTE — Procedures (Addendum)
Patient Name: Tara Sloan  MRN: [id number]  Epilepsy Attending: Lora Havens  Referring Physician/Provider: Dr Donnetta Simpers Duration: 12/31/2020 4098 01/01/2021 0903   Patient history: 75 year old female with progressive aphasia and right-sided weakness. EEG to evaluate for seizure  Level of alertness: Awake/lethargic  AEDs during EEG study: None  Technical aspects: This EEG study was done with scalp electrodes positioned according to the 10-20 International system of electrode placement. Electrical activity was acquired at a sampling rate of 500Hz  and reviewed with a high frequency filter of 70Hz  and a low frequency filter of 1Hz . EEG data were recorded continuously and digitally stored.   Description: No posterior dominant rhythm was seen. Lateralized periodic epileptiform discharges were seen in left hemisphere, maximal left centro-parietal region, at 1Hz , which at times appears rhythmic. EEG also showed continuous generalized 3 to 6 Hz theta-delta slowing. Hyperventilation and photic stimulation were not performed.     ABNORMALITY - Lateralized periodic epileptiform discharges, left hemisphere, maximal left centro-parietal region -Continuous slow, generalized  IMPRESSION: This study showed epileptogenicity in the left hemisphere, maximal left central parietal region due to underlying structural abnormality.  The lateralized periodic epileptiform discharges at times appear rhythmic which is on the ictal-interictal continuum.  Additionally there is moderate diffuse encephalopathy, nonspecific etiology but likely related to seizure, underlying structural abnormality.  Anush Wiedeman Barbra Sarks

## 2021-01-02 DIAGNOSIS — U071 COVID-19: Secondary | ICD-10-CM | POA: Diagnosis not present

## 2021-01-02 DIAGNOSIS — G9340 Encephalopathy, unspecified: Secondary | ICD-10-CM | POA: Diagnosis not present

## 2021-01-02 DIAGNOSIS — J9601 Acute respiratory failure with hypoxia: Secondary | ICD-10-CM | POA: Diagnosis not present

## 2021-01-02 LAB — CBC
HCT: 37.6 % (ref 36.0–46.0)
Hemoglobin: 12.8 g/dL (ref 12.0–15.0)
MCH: 30 pg (ref 26.0–34.0)
MCHC: 34 g/dL (ref 30.0–36.0)
MCV: 88.3 fL (ref 80.0–100.0)
Platelets: 209 10*3/uL (ref 150–400)
RBC: 4.26 MIL/uL (ref 3.87–5.11)
RDW: 14.9 % (ref 11.5–15.5)
WBC: 14 10*3/uL — ABNORMAL HIGH (ref 4.0–10.5)
nRBC: 0 % (ref 0.0–0.2)

## 2021-01-02 LAB — COMPREHENSIVE METABOLIC PANEL
ALT: 101 U/L — ABNORMAL HIGH (ref 0–44)
AST: 96 U/L — ABNORMAL HIGH (ref 15–41)
Albumin: 1.8 g/dL — ABNORMAL LOW (ref 3.5–5.0)
Alkaline Phosphatase: 46 U/L (ref 38–126)
Anion gap: 12 (ref 5–15)
BUN: 15 mg/dL (ref 8–23)
CO2: 20 mmol/L — ABNORMAL LOW (ref 22–32)
Calcium: 7.2 mg/dL — ABNORMAL LOW (ref 8.9–10.3)
Chloride: 109 mmol/L (ref 98–111)
Creatinine, Ser: 0.61 mg/dL (ref 0.44–1.00)
GFR, Estimated: >60 mL/min (ref >60)
Glucose, Bld: 142 mg/dL — ABNORMAL HIGH (ref 70–99)
Potassium: 3.9 mmol/L (ref 3.5–5.1)
Sodium: 141 mmol/L (ref 135–145)
Total Bilirubin: 0.6 mg/dL (ref 0.3–1.2)
Total Protein: 4.9 g/dL — ABNORMAL LOW (ref 6.5–8.1)

## 2021-01-02 LAB — PHOSPHORUS: Phosphorus: 2.1 mg/dL — ABNORMAL LOW (ref 2.5–4.6)

## 2021-01-02 LAB — GLUCOSE, CAPILLARY
Glucose-Capillary: 108 mg/dL — ABNORMAL HIGH (ref 70–99)
Glucose-Capillary: 120 mg/dL — ABNORMAL HIGH (ref 70–99)
Glucose-Capillary: 121 mg/dL — ABNORMAL HIGH (ref 70–99)
Glucose-Capillary: 127 mg/dL — ABNORMAL HIGH (ref 70–99)
Glucose-Capillary: 157 mg/dL — ABNORMAL HIGH (ref 70–99)
Glucose-Capillary: 173 mg/dL — ABNORMAL HIGH (ref 70–99)

## 2021-01-02 LAB — MAGNESIUM: Magnesium: 2.1 mg/dL (ref 1.7–2.4)

## 2021-01-02 MED ORDER — ACETAMINOPHEN 160 MG/5ML PO SOLN
650.0000 mg | Freq: Four times a day (QID) | ORAL | Status: DC | PRN
  Administered 2021-01-03: 650 mg
  Filled 2021-01-02: qty 20.3

## 2021-01-02 NOTE — Progress Notes (Signed)
Pt nasotracheal suctioned for small white secretions. No complications noted.

## 2021-01-02 NOTE — Progress Notes (Signed)
LTM EEG discontinued - no skin breakdown at unhook.   

## 2021-01-02 NOTE — Progress Notes (Addendum)
Spoke with son, Tara Sloan, who states he is not aware of the reason his brother, Tara Sloan, would be made the primary contact.  After discussing with Dr. Lynetta Mare, I spoke with Tara Sloan again and explained that he and Tara Sloan must decide between themselves who the primary contact will be as we will not be able to contact each with updates daily.   Tara Sloan continued to ask what had to be done to decide he will be the primary as his brother "gets things confused."  In further discussion with Dr. Darrick Meigs ([facility] resident), she stated that Tara Sloan is who she updated today and he will be the primary contact unless she is informed that the brothers have come to another decision.  1/12 1200 Addendum: Tara Sloan has stated he is okay with Tara Sloan being primary contact.

## 2021-01-02 NOTE — Procedures (Signed)
Patient Name:Tara Sloan NFA:[id number] Epilepsy Attending:Mariely Mahr Barbra Sarks Referring Physician/Provider:Dr Donnetta Simpers Duration:01/01/2021 0903 01/02/2021 1053   Patient history:75 year old female with progressive aphasia and right-sided weakness. EEG to evaluate for seizure  Level of alertness:Awake/lethargic  AEDs during EEG study:None  Technical aspects: This EEG study was done with scalp electrodes positioned according to the 10-20 International system of electrode placement. Electrical activity was acquired at a sampling rate of 500Hz  and reviewed with a high frequency filter of 70Hz  and a low frequency filter of 1Hz . EEG data were recorded continuously and digitally stored.    Description:Noposterior dominant rhythm was seen. Lateralized periodic epileptiform discharges were seen in left hemisphere, maximal left centro-parietal region, at 1Hz , which at times appears rhythmic. EEGalsoshowed continuous generalized 3 to 6 Hz theta-delta slowing. Hyperventilation and photic stimulation were not performed.   Of note, part of the study was uninterpretable between 01/01/2021 1030 to 1215 and 2140 due to electrode artifact.  ABNORMALITY - Lateralized periodic epileptiform discharges,left hemisphere, maximal left centro-parietal region -Continuousslow, generalized  IMPRESSION: This studyshowed epileptogenicity in the left hemisphere, maximal left central parietal region due to underlying structural abnormality.  The lateralized periodic epileptiform discharges at times appear rhythmic which is on the ictal-interictal continuum.  Additionally there ismoderate diffuse encephalopathy, nonspecific etiology but likely related toseizure, underlying structural abnormality.  EEG appears similar to previous day.  [name] Barbra Sarks

## 2021-01-02 NOTE — Progress Notes (Signed)
NAME:  Tara Sloan, MRN:  [id number], DOB:  1946/11/09, LOS: 4 ADMISSION DATE:  12/31/2020, CONSULTATION DATE:  12/29/2020 REFERRING MD:  TRH, CHIEF COMPLAINT:  Airway protection   Brief History:  75 yo F with a history of diffuse large B cell lymphoma in remission (2012) and CHF with mildly reduced EF (EF 45-50% in 2015) who presented with slurring words, aphasia, fever, and MRI concerning for encephalitis, with a typical distribution for HSV encephalitis.  I gathered all of the history from [name] at bedside.  Patient started not acting quite herself on Wednesday, but still was conversational.  When her son saw her Friday night around 6pm, she was slurring words and they called EMS.  On arrival to the ED, she was only able to follow directions intermittently, and was on room air.  Nonfocal exam.  She progressed to not being able to follow any directions, but she is responsive to noxious stimuli.  LP was done in the ED after Code stroke scans were negative but MRI showed possible encephalitis.  She has had increasing O2 requirements and upper airway gurgling, though no gross aspiration event was noted.  Because of concern for airway protection, hospitalist called ICU for admission.    She was found to be COVID positive, though exhibited no signs of COVID-19 illness.  She is fully vaccinated, and her son thinks she also got a booster.    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 There are also lateralized periodic epileptiform discharges, left hemisphere, maximal left centro-parietal region suggestive of underlying structural abnormality  PICC line 1/11>>  Significant Diagnostic Tests:   MRI 1/7: 1. Abnormal T2/FLAIR signal intensity involving the left insula and  subinsular region, with extension to involve the mesial and anterior left temporal lobe, including the left hippocampus. Similar but less pronounced changes involve the right temporal lobe and insular cortex. Given provided history, finding is highly concerning for possible acute encephalitis, with herpes encephalitis a high consideration given the location of these findings.   MRI brain 1/9>>evolution of prior abnormal signal with decreased diffusion and increased vasogenic edema.  Micro Data:  CSF: protein 68, glucose 82, WBCs present. 5-9 WCs, lymphs  - Crypto Ag >>neg - HSV PCR >> POSITIVE - VZV PCR >>negative - CSF cx neg  SARS COV2 positive  Antimicrobials:  Vancomycin-1 dose 1/7 Ceftriaxone-1 dose 1/7 Ampicillin-one dose 1/7 Unasyn 1/8>> Acyclovir 1/7 >>  Interim History / Subjective:   HSV PCR from CSF returned positive overnight.   Objective   Blood pressure (!) 158/73, pulse 88, temperature 98.8 F (37.1 C), temperature source Axillary, resp. rate (!) 32, height 5\' 4"  (1.626 m), weight 59.4 kg, SpO2 94 %.        Intake/Output Summary (Last 24 hours) at 01/02/2021 0746 Last data filed at 01/02/2021 0500 Gross per 24 hour  Intake 2420.06 ml  Output 2640 ml  Net -219.94 ml   Filed Weights   12/31/20 0439 01/01/21 0438 01/02/21 0500  Weight: 54.5 kg 59 kg 59.4 kg    Examination: General: awake, in no acute distress HENT: EEG leads Lungs: breathing comfortably on 3L. Bilateral upper respiratory sounds present Cardiovascular: regular rate and rhythm Abdomen: soft, nontender, nondistended Extremities: moves all extremities  Neuro: does not awaken  to voice. Opens eyes to sternal rub. Still not following any commands. Moves all extremities to noxious stimuli. Still seems globally aphasic GU: foley  Resolved Hospital Problem list     Assessment & Plan:  # Acute hypoxic respiratory failure. Improving. Down to 3L this morning.  # Hx of HFmrEF. Her last echo was in  2016 which showed a mrEF 45%. Wt is up 6kg since admission.The remainder of her exam is not consistent with hypervolemia however would consider repeating an echo.  # Aspiration pneumonia. Febrile yesterday afternoon. #COVID 19 +. Do not suspect this to be cause of her sx P: -continue unasyn -Decadron  -maintain euvolemia  # HSV-1 Encephalitis   CSF HSV-1 returned positive overnight. Neuro exam remains poor--still not following commands.  # Encephalitis induced epilepsy P: -appreciate neurology -LTM -Continue acyclovir--tx day #6. PICC line placed 1/11 so will have access for the 14-21d needed. -Keppra, depakote per neurology  Steroid induced hyperglycemia--SSI High risk Malnutrition--continue tube feeds (started 1/10)  Chronic medical problems: # history of diffuse large B cell lymphoma # CHF, mildly reduced EF in 2015. Net +5L since admission.   Best practice (evaluated daily)  Diet: NPO, cortrak Pain/Anxiety/Delirium protocol (if indicated): n/a VAP protocol (if indicated): n/a DVT prophylaxis: lovenox GI prophylaxis: n/a Glucose control: SSI Mobility: PT Disposition: likely stable for transfer out of the unit today  Goals of Care:  Last date of multidisciplinary goals of care discussion: 1/8 Family and staff present:  son [name] Summary of discussion: code status is full code Follow up goals of care discussion due: son bryant updated 1/11 Code Status: Full  Labs   CBC: Recent Labs  Lab 12/30/2020 2219 12/30/20 0831 12/31/20 0832 01/01/21 1303 01/02/21 0539  WBC 15.1* 14.8* 16.1* 15.5* 14.0*  NEUTROABS 12.6*  --   --   --   --   HGB 14.2 11.8* 12.9 12.9 12.8  HCT 44.1 37.7 40.1 37.7 37.6  MCV 92.1 93.8 90.7 87.5 88.3  PLT 225 168 160 206 169    Basic Metabolic Panel: Recent Labs  Lab 12/30/20 0831 12/30/20 1250 12/31/20 0832 12/31/20 1837 01/01/21 1303 01/01/21 1838 01/02/21 0539  NA 143 144 142  --  144  --  141  K 3.1* 3.4* 3.3*  --  3.6  --  3.9   CL 110 110 107  --  112*  --  109  CO2 24 22 21*  --  21*  --  20*  GLUCOSE 137* 197* 170*  --  132*  --  142*  BUN 36* 36* 28*  --  19  --  15  CREATININE 0.93 0.92 0.78  --  0.74  --  0.61  CALCIUM 7.7* 7.9* 7.6*  --  7.4*  --  7.2*  MG 2.5*  --   --  2.4 2.2 2.2 2.1  PHOS 1.9*  --   --  <1.0* 1.4* 3.4 2.1*   GFR: Estimated Creatinine Clearance: 52.5 mL/min (by C-G formula based on SCr of 0.61 mg/dL). Recent Labs  Lab 12/29/20 0338 12/29/20 0609 12/29/20 1331 12/30/20 0831 12/31/20 0832 01/01/21 1303 01/02/21 0539  PROCALCITON  --  <0.10  --   --   --   --   --   WBC  --   --   --  14.8* 16.1* 15.5* 14.0*  LATICACIDVEN 2.3*  --  2.3*  --   --   --   --     Liver Function Tests: Recent Labs  Lab 01-21-21 2219 01/01/21 1303 01/02/21 0539  AST 30 73* 96*  ALT 21 63* 101*  ALKPHOS 50 45 46  BILITOT 0.8 0.6 0.6  PROT 7.0 5.2* 4.9*  ALBUMIN 3.7 1.9* 1.8*   No results for input(s): LIPASE, AMYLASE in the last 168 hours. No results for input(s): AMMONIA in the last 168 hours.  ABG    Component Value Date/Time   TCO2 25 01/21/21 2214     Mitzi Hansen, MD Internal Medicine Resident PGY-2 [facility] Pager: [phone] 01/02/2021 7:46 AM

## 2021-01-02 NOTE — Progress Notes (Addendum)
TELEPHONE NOTE  I updated pt's son, Heath Lark, on HSV findings on CSF.  We discussed HSV-1 encephalitis and it's immediate and long term effects.  He asked if the HSV was the cause of her dementia, which she has had for two year. I explained that this infection is, at least in part, a cause of her acute mental status changes, however is not the cause of the 2 year history of functional decline.  He notes that she has dementia. She resides at home with her husband and is able to complete her ADLs at baseline. Dementia has caused her an inability to complete several of her iADLs including cooking, cleaning, medications and finances.  He notes that his father has mobility issues and that they are getting to the point of having difficulty caring for themselves at home.  She does not currently have a HCPOA. Based on my discussion with Heath Lark and nursing notes, there is conflict between him and his brother. He comments that Marymargaret's husband "is sharp as a tac". We discussed that it will be important for his family to work together as there are going to be several big decisions coming up in the near future and that they should elect one person to serve as the primary contact for communication between healthcare staff and family.  Heath Lark notes that he is ok with his brother, [name], to serve as the primary contact for health care providers. He felt that his brother is not as medically literate as he Heath Lark) is because he had worked in [employer] as a Insurance risk surveyor for 20+ years, but he is feeling that our conversation today has been helpful. He understands that he can continue to call with questions.  Mitzi Hansen, MD Internal Medicine Resident PGY-2 [facility] Pager: [phone] 01/02/2021 3:40 PM

## 2021-01-03 ENCOUNTER — Inpatient Hospital Stay (HOSPITAL_COMMUNITY): Payer: Medicare Other

## 2021-01-03 DIAGNOSIS — U071 COVID-19: Secondary | ICD-10-CM | POA: Diagnosis not present

## 2021-01-03 DIAGNOSIS — M7989 Other specified soft tissue disorders: Secondary | ICD-10-CM | POA: Diagnosis not present

## 2021-01-03 DIAGNOSIS — G9340 Encephalopathy, unspecified: Secondary | ICD-10-CM | POA: Diagnosis not present

## 2021-01-03 DIAGNOSIS — J9601 Acute respiratory failure with hypoxia: Secondary | ICD-10-CM | POA: Diagnosis not present

## 2021-01-03 LAB — COMPREHENSIVE METABOLIC PANEL
ALT: 175 U/L — ABNORMAL HIGH (ref 0–44)
AST: 134 U/L — ABNORMAL HIGH (ref 15–41)
Albumin: 1.8 g/dL — ABNORMAL LOW (ref 3.5–5.0)
Alkaline Phosphatase: 51 U/L (ref 38–126)
Anion gap: 11 (ref 5–15)
BUN: 16 mg/dL (ref 8–23)
CO2: 21 mmol/L — ABNORMAL LOW (ref 22–32)
Calcium: 7.6 mg/dL — ABNORMAL LOW (ref 8.9–10.3)
Chloride: 107 mmol/L (ref 98–111)
Creatinine, Ser: 0.59 mg/dL (ref 0.44–1.00)
GFR, Estimated: >60 mL/min (ref >60)
Glucose, Bld: 160 mg/dL — ABNORMAL HIGH (ref 70–99)
Potassium: 4.1 mmol/L (ref 3.5–5.1)
Sodium: 139 mmol/L (ref 135–145)
Total Bilirubin: 0.6 mg/dL (ref 0.3–1.2)
Total Protein: 4.8 g/dL — ABNORMAL LOW (ref 6.5–8.1)

## 2021-01-03 LAB — CULTURE, BLOOD (ROUTINE X 2)
Culture: NO GROWTH
Culture: NO GROWTH

## 2021-01-03 LAB — CBC
HCT: 41.2 % (ref 36.0–46.0)
Hemoglobin: 13.2 g/dL (ref 12.0–15.0)
MCH: 28.9 pg (ref 26.0–34.0)
MCHC: 32 g/dL (ref 30.0–36.0)
MCV: 90.4 fL (ref 80.0–100.0)
Platelets: 207 10*3/uL (ref 150–400)
RBC: 4.56 MIL/uL (ref 3.87–5.11)
RDW: 14.7 % (ref 11.5–15.5)
WBC: 16.7 10*3/uL — ABNORMAL HIGH (ref 4.0–10.5)
nRBC: 0 % (ref 0.0–0.2)

## 2021-01-03 LAB — GLUCOSE, CAPILLARY
Glucose-Capillary: 136 mg/dL — ABNORMAL HIGH (ref 70–99)
Glucose-Capillary: 147 mg/dL — ABNORMAL HIGH (ref 70–99)
Glucose-Capillary: 156 mg/dL — ABNORMAL HIGH (ref 70–99)
Glucose-Capillary: 183 mg/dL — ABNORMAL HIGH (ref 70–99)

## 2021-01-03 LAB — AMMONIA: Ammonia: 21 umol/L (ref 9–35)

## 2021-01-03 LAB — VALPROIC ACID LEVEL: Valproic Acid Lvl: 69 ug/mL (ref 50.0–100.0)

## 2021-01-03 MED ORDER — GLYCOPYRROLATE 0.2 MG/ML IJ SOLN
0.2000 mg | INTRAMUSCULAR | Status: DC | PRN
  Administered 2021-01-03: 0.2 mg via INTRAVENOUS
  Filled 2021-01-03 (×2): qty 1

## 2021-01-03 MED ORDER — LORAZEPAM 2 MG/ML PO CONC: 1.0000 mg | ORAL | Status: DC | PRN

## 2021-01-03 MED ORDER — LORAZEPAM 1 MG PO TABS: 1.0000 mg | ORAL_TABLET | ORAL | Status: DC | PRN

## 2021-01-03 MED ORDER — POLYVINYL ALCOHOL 1.4 % OP SOLN
1.0000 [drp] | Freq: Four times a day (QID) | OPHTHALMIC | Status: DC | PRN
  Filled 2021-01-03: qty 15

## 2021-01-03 MED ORDER — HALOPERIDOL 0.5 MG PO TABS
0.5000 mg | ORAL_TABLET | ORAL | Status: DC | PRN
  Filled 2021-01-03: qty 1

## 2021-01-03 MED ORDER — DIPHENHYDRAMINE HCL 50 MG/ML IJ SOLN
12.5000 mg | INTRAMUSCULAR | Status: DC | PRN
Start: 2021-01-03 — End: 2021-01-04

## 2021-01-03 MED ORDER — ONDANSETRON HCL 4 MG/2ML IJ SOLN: 4.0000 mg | Freq: Four times a day (QID) | INTRAMUSCULAR | Status: DC | PRN

## 2021-01-03 MED ORDER — BIOTENE DRY MOUTH MT LIQD: 15.0000 mL | OROMUCOSAL | Status: DC | PRN

## 2021-01-03 MED ORDER — MORPHINE BOLUS VIA INFUSION
2.0000 mg | INTRAVENOUS | Status: DC | PRN
  Administered 2021-01-03 (×4): 2 mg via INTRAVENOUS
  Filled 2021-01-03: qty 2

## 2021-01-03 MED ORDER — HALOPERIDOL LACTATE 2 MG/ML PO CONC
0.5000 mg | ORAL | Status: DC | PRN
Start: 2021-01-03 — End: 2021-01-04
  Filled 2021-01-03: qty 0.3

## 2021-01-03 MED ORDER — HEPARIN (PORCINE) 25000 UT/250ML-% IV SOLN
900.0000 [IU]/h | INTRAVENOUS | Status: DC
  Filled 2021-01-03: qty 250

## 2021-01-03 MED ORDER — MORPHINE 100MG IN NS 100ML (1MG/ML) PREMIX INFUSION
5.0000 mg/h | INTRAVENOUS | Status: DC
  Administered 2021-01-03 – 2021-01-04 (×2): 5 mg/h via INTRAVENOUS
  Filled 2021-01-03 (×2): qty 100

## 2021-01-03 MED ORDER — GLYCOPYRROLATE 0.2 MG/ML IJ SOLN: 0.2000 mg | INTRAMUSCULAR | Status: DC | PRN

## 2021-01-03 MED ORDER — ONDANSETRON 4 MG PO TBDP: 4.0000 mg | ORAL_TABLET | Freq: Four times a day (QID) | ORAL | Status: DC | PRN

## 2021-01-03 MED ORDER — HALOPERIDOL LACTATE 5 MG/ML IJ SOLN: 0.5000 mg | INTRAMUSCULAR | Status: DC | PRN

## 2021-01-03 MED ORDER — GLYCOPYRROLATE 1 MG PO TABS
1.0000 mg | ORAL_TABLET | ORAL | Status: DC | PRN
Start: 2021-01-03 — End: 2021-01-04
  Filled 2021-01-03: qty 1

## 2021-01-03 MED ORDER — LORAZEPAM 2 MG/ML IJ SOLN
1.0000 mg | INTRAMUSCULAR | Status: DC | PRN
  Administered 2021-01-03: 1 mg via INTRAVENOUS
  Filled 2021-01-03: qty 1

## 2021-01-03 NOTE — Progress Notes (Signed)
VASCULAR LAB    Left lower extremity venous duplex has been performed.  See CV proc for preliminary results.   Kaiven Vester, RVT 01/03/2021, 1:21 PM

## 2021-01-03 NOTE — Care Plan (Addendum)
GOALS OF CARE CHANGE  Head CT obtained this morning shows interval progression of edema in the left insular region since prior.   I attempted to contact her son, Tara Sloan, to provide an update in her clinical deterioration. Unfortunately, it went straight to VM so HIPPA complaint message left.  I was able to contact her other son, Tara Sloan, to discuss the significant change in her status from yesterday. I reviewed head CT findings with him and her worsening respiratory failure. We discussed the progressive cerebral edema and goals of care. He expressed understanding of an overall poor prognosis in light of the changes and new findings and expressed understanding of the impact on her morbidity should she survive this.  We discussed goals of care. Tara Sloan notes that he had discussed this with his family yesterday and that it was decided that a change to DNR/DNI would be consistent with her wishes.   Plan: code status changed to DNR/DNI. Will continue full scope of care for now and continue to monitor and update family accordingly.  Addendum #1 01/03/21 11:33AM : I was able to update Tara Sloan when he called back. Discussed code status changes made based on conversation with Tara Sloan, which Tara Sloan said "ok" to.  Addendum #2 01/03/21 1:29PM:  RUE duplex returned positive for right axillary DVT and superficial basalic and cephalic vein thrombus.  -remove PICC -heparin gtt per pharmacy -currently has one peripheral line present for access. Will work on obtaining a second.  Respiratory status continues to decline. Now up to [place], who is with his father, was called and updated with findings and plan. Confirmed DNR with pt's husband. Discussed that we will continue to monitor throughout this afternoon and may need to consider comfort care in the near future if she continues to deteriorate.   Addendum #3 01/03/21 3:26PM O2 sats have continued to drop on the 15L, now in upper 80s. I re-evaluated her at  bedside. Neuro exam is showing a decline in responsiveness even to noxious stimuli and gag reflex is minimal. Pupils remain equal and responsive. Given her significant rhonchi that can be heard even from a distance, I asked RT to try NP suction which produced thick bloody appearing secretions.   I spoke with pt's sons who are in agreement with a transition to comfort care. They were explained visitor policies for patient's with a positive COVID-19 test.   Comfort orders will be placed. Morphine gtt can be started shortly before family arrives and then oxygen can be weaned as morphine gtt is titrated.   Tara Hansen, MD Internal Medicine Resident PGY-2 [facility] Pager: [phone] 01/03/2021 11:44 AM

## 2021-01-03 NOTE — Progress Notes (Signed)
NAME:  Tara Sloan, MRN:  [id number], DOB:  September 07, 1946, LOS: 5 ADMISSION DATE:  01/01/2021, CONSULTATION DATE:  12/29/2020 REFERRING MD:  TRH, CHIEF COMPLAINT:  Airway protection   Brief History:  75 yo F with a history of diffuse large B cell lymphoma in remission (2012) and CHF with mildly reduced EF (EF 45-50% in 2015) who presented with slurring words, aphasia, fever, and MRI concerning for encephalitis, with a typical distribution for HSV encephalitis.  I gathered all of the history from her [name] at bedside.  Patient started not acting quite herself on Wednesday, but still was conversational.  When her son saw her Friday night around 6pm, she was slurring words and they called EMS.  On arrival to the ED, she was only able to follow directions intermittently, and was on room air.  Nonfocal exam.  She progressed to not being able to follow any directions, but she is responsive to noxious stimuli.  LP was done in the ED after Code stroke scans were negative but MRI showed possible encephalitis.  She has had increasing O2 requirements and upper airway gurgling, though no gross aspiration event was noted.  Because of concern for airway protection, hospitalist called ICU for admission.    She was found to be COVID positive, though exhibited no signs of COVID-19 illness.  She is fully vaccinated, and her son thinks she also got a booster.    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 There are also lateralized periodic epileptiform discharges, left hemisphere, maximal left centro-parietal region suggestive of underlying structural abnormality  PICC line 1/11>>  Significant Diagnostic Tests:   MRI 1/7: 1. Abnormal T2/FLAIR signal intensity involving the left insula and  subinsular region, with extension to involve the mesial and anterior left temporal lobe, including the left hippocampus. Similar but less pronounced changes involve the right temporal lobe and insular cortex. Given provided history, finding is highly concerning for possible acute encephalitis, with herpes encephalitis a high consideration given the location of these findings.   MRI brain 1/9>>evolution of prior abnormal signal with decreased diffusion and increased vasogenic edema.  Micro Data:  CSF: protein 68, glucose 82, WBCs present. 5-9 WCs, lymphs  - Crypto Ag >>neg - HSV PCR >> POSITIVE - VZV PCR >>negative - CSF cx neg  SARS COV2 positive  Antimicrobials:  Vancomycin-1 dose 1/7 Ceftriaxone-1 dose 1/7 Ampicillin-one dose 1/7 Unasyn 1/8>> Acyclovir 1/7 >>  Interim History / Subjective:  No overnight events Neurological decline this morning.   Objective   Blood pressure (!) 158/84, pulse 96, temperature 99.2 F (37.3 C), temperature source Axillary, resp. rate (!) 28, height 5\' 4"  (1.626 m), weight 59 kg, SpO2 93 %.        Intake/Output Summary (Last 24 hours) at 01/03/2021 1011 Last data filed at 01/03/2021 0900 Gross per 24 hour  Intake 2011.68 ml  Output 2800 ml  Net -788.32 ml   Filed Weights   01/01/21 0438 01/02/21 0500 01/03/21 0600  Weight: 59 kg 59.4 kg 59 kg    Examination: General: acutely ill appearing HENT: atraumatic Lungs: on 10L [place] this morning. Rhonchi bilaterally Cardiovascular: regular rate and rhythm. Peripheral pulses intact Abdomen: soft, nontender, nondistended Extremities: no LE edema Neuro: PERRL. unresponsive this morning. Is not following  commands. Localizes to noxious stimuli on the left upper extremity. Right upper and lower extremities not responsive to noxious stimuli GU: foley  Resolved Hospital Problem list   Aspiration pneumonia s/p 5d tx with unasyn 1/8-1/12  Assessment & Plan:  # Acute hypoxic respiratory failure.  Worsening respiratory status this morning--up to 10L [place] this morning from 3 yesterday. I suspect this is multifactorial involving persistent aspiration and maybe pulm edema. She does have a history of HFmrEF and is net + 5L since admission however does not appear volume overloaded on exam. #COVID 19 +.  P: -CXR -Decadron  -maintain euvolemia  # HSV-1 Encephalitis. Neuro exam has declined significantly from yesterday. She was unresponsive this morning and I could only really appreciate localization in the left upper extremity. # Encephalitis induced epilepsy. Liver enzymes trending up P: -appreciate neurology -stat head CT due to significant neurological changes -EEG pending CT results -Continue acyclovir--tx day #7.  -Keppra, depakote per neurology. Continue to monitor liver enzymes  Steroid induced hyperglycemia--SSI High risk Malnutrition--continue tube feeds (started 1/10)  Chronic medical problems: # history of diffuse large B cell lymphoma # CHF, mildly reduced EF in 2015. Net +5L since admission.   Best practice (evaluated daily)  Diet: NPO, cortrak Pain/Anxiety/Delirium protocol (if indicated): n/a VAP protocol (if indicated): n/a DVT prophylaxis: lovenox GI prophylaxis: n/a Glucose control: SSI Mobility: BR Disposition: ICU  Goals of Care:  Last date of multidisciplinary goals of care discussion: 1/8 Family and staff present:  [name] Summary of discussion: code status is full code Follow up goals of care discussion due: ongoing Code Status: Full  Labs   CBC: Recent Labs  Lab 01/22/21 2219 12/30/20 0831 12/31/20 0832 01/01/21 1303 01/02/21 0539 01/03/21 0412  WBC 15.1* 14.8* 16.1* 15.5* 14.0* 16.7*  NEUTROABS 12.6*  --   --   --   --   --   HGB 14.2 11.8* 12.9 12.9 12.8 13.2  HCT 44.1 37.7 40.1 37.7 37.6 41.2  MCV 92.1 93.8 90.7 87.5 88.3 90.4  PLT 225 168 160 206 209 528    Basic Metabolic Panel: Recent Labs  Lab 12/30/20 0831 12/30/20 1250  12/31/20 0832 12/31/20 1837 01/01/21 1303 01/01/21 1838 01/02/21 0539 01/03/21 0412  NA 143 144 142  --  144  --  141 139  K 3.1* 3.4* 3.3*  --  3.6  --  3.9 4.1  CL 110 110 107  --  112*  --  109 107  CO2 24 22 21*  --  21*  --  20* 21*  GLUCOSE 137* 197* 170*  --  132*  --  142* 160*  BUN 36* 36* 28*  --  19  --  15 16  CREATININE 0.93 0.92 0.78  --  0.74  --  0.61 0.59  CALCIUM 7.7* 7.9* 7.6*  --  7.4*  --  7.2* 7.6*  MG 2.5*  --   --  2.4 2.2 2.2 2.1  --   PHOS 1.9*  --   --  <1.0* 1.4* 3.4 2.1*  --    GFR: Estimated Creatinine Clearance: 52.5 mL/min (by C-G formula based on SCr of 0.59 mg/dL). Recent Labs  Lab 12/29/20 0338 12/29/20 0609 12/29/20 1331 12/30/20 0831 12/31/20 0832 01/01/21 1303 01/02/21 0539 01/03/21 0412  PROCALCITON  --  <0.10  --   --   --   --   --   --   WBC  --   --   --    < >  16.1* 15.5* 14.0* 16.7*  LATICACIDVEN 2.3*  --  2.3*  --   --   --   --   --    < > = values in this interval not displayed.    Liver Function Tests: Recent Labs  Lab 01/06/2021 2219 01/01/21 1303 01/02/21 0539 01/03/21 0412  AST 30 73* 96* 134*  ALT 21 63* 101* 175*  ALKPHOS 50 45 46 51  BILITOT 0.8 0.6 0.6 0.6  PROT 7.0 5.2* 4.9* 4.8*  ALBUMIN 3.7 1.9* 1.8* 1.8*   No results for input(s): LIPASE, AMYLASE in the last 168 hours. No results for input(s): AMMONIA in the last 168 hours.  ABG    Component Value Date/Time   TCO2 25 01/17/2021 2214     Mitzi Hansen, MD Internal Medicine Resident PGY-2 [facility] Pager: [phone] 01/03/2021 10:11 AM

## 2021-01-03 NOTE — Progress Notes (Signed)
ANTICOAGULATION CONSULT NOTE - Initial Consult  Pharmacy Consult for heparin Indication: Acute RUE DVT  Allergies  Allergen Reactions  . Pnu-Imune [Pneumococcal Polysaccharide Vaccine] Hives    Patient Measurements: Height: 5\' 4"  (162.6 cm) Weight: 59 kg (130 lb 1.1 oz) IBW/kg (Calculated) : 54.7 Heparin Dosing Weight: 59 kg   Vital Signs: Temp: 101 F (38.3 C) (01/13 1241) Temp Source: Axillary (01/13 1241) BP: 129/73 (01/13 1300) Pulse Rate: 101 (01/13 1300)  Labs: Recent Labs    01/01/21 1303 01/02/21 0539 01/03/21 0412  HGB 12.9 12.8 13.2  HCT 37.7 37.6 41.2  PLT 206 209 207  CREATININE 0.74 0.61 0.59    Estimated Creatinine Clearance: 52.5 mL/min (by C-G formula based on SCr of 0.59 mg/dL).   Medical History: Past Medical History:  Diagnosis Date  . Allergic rhinitis   . Arthritis    "hands; probably qwhere" (03/12/2015)  . Basal cell carcinoma ~ 2010   "leg"  . Chronic back pain   . Diffuse large B cell lymphoma ([place]) 11/07/11   dx from tonsilar biopsy  . External hemorrhoid   . GERD (gastroesophageal reflux disease)   . History of radiation therapy 03/22/12 - 04/09/12   right oropharynx/neck  . HLD (hyperlipidemia)   . LBBB (left bundle branch block) dx'd 05/2014  . Non Hodgkin's lymphoma ([place])   . Osteoarthritis of right knee   . Osteopenia     Medications:  Medications Prior to Admission  Medication Sig Dispense Refill Last Dose  . aspirin EC 81 MG tablet Take 1 tablet (81 mg total) by mouth 2 (two) times daily. For DVT prophylaxis 60 tablet 0 12/26/2020  . atorvastatin (LIPITOR) 80 MG tablet TAKE 1 TABLET BY MOUTH ONCE A DAY AT [facility] THE EVENING (Patient taking differently: Take 80 mg by mouth every evening.) 90 tablet 3 12/25/2020  . busPIRone (BUSPAR) 15 MG tablet TAKE HALF TABLET (7.5 MG TOTAL) BY MOUTHTWICE DAILY (Patient taking differently: Take 7.5 mg by mouth 2 (two) times daily.) 90 tablet 3 12/26/2020  . Calcium Citrate-Vitamin D (CALCIUM + D  PO) Take 1 tablet by mouth at bedtime.   12/25/2020  . donepezil (ARICEPT) 10 MG tablet Take 1 tablet (10 mg total) by mouth at bedtime. 90 tablet 3 12/25/2020  . Multiple Vitamin (MULTIVITAMIN WITH MINERALS) TABS tablet Take 1 tablet by mouth every evening.   12/25/2020  . omeprazole (PRILOSEC) 20 MG capsule TAKE 1 CAPSULE BY MOUTH DAILY AS NEEDED FOR HEARTBURN (Patient taking differently: Take 20 mg by mouth daily as needed (heartburn).) 90 capsule 3 unk    Assessment: 41 YOF being treated for HSV encephalitis now with acute RUE DVT. Pharmacy consulted to start IV heparin. Of note, a head CT this AM demonstrated progression of intracerebral edema with progressive infarct in the differential per radiologist impression. Will dose heparin per stroke protocol.    H/H and Plt wnl. SCr wnl   Goal of Therapy:  Heparin level 0.3-0.5 units/ml Monitor platelets by anticoagulation protocol: Yes   Plan:  -D/c prophylactic Lovenox  -Start IV heparin at 900 units/hr. No bolus  -F/u 8 hr HL -Monitor daily HL, CBC and s/s of bleeding   Albertina Parr, PharmD., BCPS, BCCCP Clinical Pharmacist Please refer to [facility] for unit-specific pharmacist

## 2021-01-03 NOTE — Progress Notes (Signed)
RT NT suctioned pt through left nare. RT could not pass suction catheter and seen blood. RT switched to right nare and could pass cath. RT obtained tenacious blood tinge from right nare. RT stopped at that point. Pt did not wake during procedure. PA of pt was notified.

## 2021-01-03 NOTE — Progress Notes (Signed)
NEUROLOGY CONSULTATION PROGRESS NOTE   Date of service: January 03, 2021 Patient Name: Tara Sloan MRN:  [id number] DOB:  [birth year]  Brief HPI  TRINITA DEVLIN is a 75 y.o. female presenting with acute onset slurred speech and aphasia over the course of a day(LKW 1/6). MRI brain with T2 flair hyperintensity in the left insula, subinsular region with extension into the mesial and anterior left temporal lobe and left hippocampus.  Also noted to have similar but less pronounced changes on the right temporal lobe and insula.  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  Work-up with LP with CSF profile on tube 4 with mild pleocytosis, mildly elevated protein to 68. HSV and VZV PCR and IgM and IgG results are still pending.   Interval Hx   Continues to be encephalopathic. HSV PCR +ve in CSF  Vitals   Vitals:   01/03/21 0500 01/03/21 0600 01/03/21 0700 01/03/21 0800  BP: (!) 141/73  140/89   Pulse: 88  91   Resp: (!) 28  (!) 23   Temp:    99.2 F (37.3 C)  TempSrc:    Axillary  SpO2: 94%  95%   Weight:  59 kg    Height:       Body mass index is 22.33 kg/m.  Physical Exam   General: Laying comfortably in bed; in no acute distress. HENT: Normal oropharynx and mucosa. Normal external appearance of ears and nose. Neck: Supple, no pain or tenderness CV: No JVD. No peripheral edema. Pulmonary: Symmetric Chest rise. Normal respiratory effort. Abdomen: Soft to touch, non-tender. Ext: cyanotic RUE. Difficult to palpate pedal pulse in LLE Skin: No rash. Normal palpation of skin.  Musculoskeletal: Normal digits and nails by inspection. No clubbing.  Neurologic Examination  MS: opens eyes to voice inconsistently, not attempt to follow commands. Non verbal Cranial nerves: Pupils equal round react to light, extract movements intact, visual fields full to threat, face-mild asymmetry with right nasolabial fold flattening and no twitching seen Motor exam: RUE - no movement  spontaneously or to nox stim. Weak withdrawal of RLE. Brisk LUE and LLE withdrawal. Sensation: above Coordination difficult to assess given her current mentation  Labs   Basic Metabolic Panel:  Lab Results  Component Value Date   NA 139 01/03/2021   K 4.1 01/03/2021   CO2 21 (L) 01/03/2021   GLUCOSE 160 (H) 01/03/2021   BUN 16 01/03/2021   CREATININE 0.59 01/03/2021   CALCIUM 7.6 (L) 01/03/2021   GFRNONAA >60 01/03/2021   GFRAA >60 05/28/2018  Urine Drug Screen:     Component Value Date/Time   LABOPIA NONE DETECTED 12/29/2020 1459   COCAINSCRNUR NONE DETECTED 12/29/2020 1459   LABBENZ NONE DETECTED 12/29/2020 1459   AMPHETMU NONE DETECTED 12/29/2020 1459   THCU NONE DETECTED 12/29/2020 1459   LABBARB NONE DETECTED 12/29/2020 1459    Imaging and Diagnostic studies    MRI Brain without contrast: 12/29/2020  IMPRESSION: 1. Abnormal T2/FLAIR signal intensity involving the left insula and subinsular region, with extension to involve the mesial and anterior left temporal lobe, including the left hippocampus. Similar but less pronounced changes involve the right temporal lobe and insular cortex. Given provided history, finding is highly concerning for possible acute encephalitis, with herpes encephalitis a high consideration given the location of these findings. Correlation with CSF values recommended. Primary differential considerations include an alternate nonspecific meningoencephalitis versus seizure. 2. No other acute intracranial abnormality. 3. Underlying chronic microvascular ischemic disease.  MR brain with  and without contrast 12/30/2020 IMPRESSION: Some evolution of prior abnormal signal with decreased reduced diffusion and increased vasogenic edema. No corresponding enhancement or hemorrhage. Differential considerations are unchanged and there may be some component of seizure effect.   Impression   Tara Sloan is a 75 y.o. female presenting with acute  onset slurred speech and aphasia over the course of a day(LKW 1/6). MRI brain with T2 flair hyperintensity in the left insula, subinsular region with extension into the mesial and anterior left temporal lobe and left hippocampus.  Also noted to have similar but less pronounced changes on the right temporal lobe and insula.  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 Needs further eval for concern for stroke or bleed in to the encephalitis area.  Other differentials incl seizures  Impression: HSV encephalitis Seizure Aspiration pneumonia Concern for new stroke v seizure  Recommendations  -continue Acyclovir -Continue Keppra and Depakote -Stat CTH due to RUE weakness which is new. If CTH unremarkable, will repeat spot EEG -Doppler RUE and LLE due to swelling and pulselessness - Seizure precautions.  Baseline she has dementia, takes care of disabled husband, who is physically disabled but cognitively sharp.  Given HSV and concurrent COVID-19, her recovery might be slow and prolonged. She has not shown any improvement, now on day 6 of acyclovir. We will continue to follow and continue to update family.  Discussed with the ICU team on the unit-Drs. Agarwala and Christian.  -- Amie Portland, MD Neurologist [facility] Pager: [phone]   CRITICAL CARE ATTESTATION Performed by: Amie Portland, MD Total critical care time: 44 minutes Critical care time was exclusive of separately billable procedures and treating other patients and/or supervising APPs/Residents/Students Critical care was necessary to treat or prevent imminent or life-threatening deterioration due to HSV encephalitis, seizures This patient is critically ill and at significant risk for neurological worsening and/or death and care requires  constant monitoring. Critical care was time spent personally by me on the following activities: development of treatment plan with patient and/or surrogate as well as nursing, discussions with consultants, evaluation of patient's response to treatment, examination of patient, obtaining history from patient or surrogate, ordering and performing treatments and interventions, ordering and review of laboratory studies, ordering and review of radiographic studies, pulse oximetry, re-evaluation of patient's condition, participation in multidisciplinary rounds and medical decision making of high complexity in the care of this patient.

## 2021-01-03 NOTE — Progress Notes (Signed)
Pharmacy Antibiotic Note  Tara Sloan is a 75 y.o. 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  Pharmacy helping dose for Acyclovir.   WBC up to 16.7, SCr wnl  Plan: Continue Acyclovir to 535 mg (10mg /kg) q8h Will f/u renal function, micro data, and pt's clinical condition  Height: 5\' 4"  (162.6 cm) Weight: 59 kg (130 lb 1.1 oz) IBW/kg (Calculated) : 54.7  Temp (24hrs), Avg:99.2 F (37.3 C), Min:98.6 F (37 C), Max:100.1 F (37.8 C)  Recent Labs  Lab 12/29/20 0338 12/29/20 1331 12/30/20 0831 12/30/20 1250 12/31/20 0832 01/01/21 1303 01/02/21 0539 01/03/21 0412  WBC  --   --  14.8*  --  16.1* 15.5* 14.0* 16.7*  CREATININE  --   --  0.93 0.92 0.78 0.74 0.61 0.59  LATICACIDVEN 2.3* 2.3*  --   --   --   --   --   --     Estimated Creatinine Clearance: 52.5 mL/min (by C-G formula based on SCr of 0.59 mg/dL).    Allergies  Allergen Reactions  . Pnu-Imune [Pneumococcal Polysaccharide Vaccine] Hives    Antibiotics   1/8 vanc x 1 1/8 Acyclovir >>  1/8 Ceftriaxone x 1 1/8 amp x 1 1/8 unasyn >>1/12  Cultures   1/8 covid+ (unclear if incidental or not per CCM) 1/8 BCx: ngtd 1/8 CSF cx: normal 1/8 CSF cell ct - not consistent w/ bacterial meningitis 1/8 HSV pcr - positive  1/8 crypto ant - neg 1/8 VZV - IgG neg  1/8 UC - negF  1/8 mrsa pcr - neg 1/8 resp panel - neg 1/7 BCx - pending 1/9 HSV1 IgG pos, IgM pending   Thank you for allowing pharmacy to be a part of this patient's care.  Albertina Parr, PharmD., BCPS, BCCCP Clinical Pharmacist Please refer to [facility] for unit-specific pharmacist

## 2021-01-03 NOTE — Progress Notes (Signed)
VASCULAR LAB    Right upper extremity venous duplex has been performed.  See CV proc for preliminary results.   Ellizabeth Dacruz, RVT 01/03/2021, 1:26 PM

## 2021-01-03 NOTE — Progress Notes (Signed)
VASCULAR LAB    Right upper extremity venous duplex has been performed.  See CV proc for preliminary results.  Messaged results via secure chat to Dr. Rory Percy.  Mitchelle Sultan, RVT 01/03/2021, 1:29 PM

## 2021-01-04 DIAGNOSIS — J9601 Acute respiratory failure with hypoxia: Secondary | ICD-10-CM | POA: Diagnosis not present

## 2021-01-04 DIAGNOSIS — U071 COVID-19: Secondary | ICD-10-CM

## 2021-01-04 DIAGNOSIS — B004 Herpesviral encephalitis: Secondary | ICD-10-CM

## 2021-01-04 DIAGNOSIS — G936 Cerebral edema: Secondary | ICD-10-CM

## 2021-01-04 DIAGNOSIS — G9340 Encephalopathy, unspecified: Secondary | ICD-10-CM | POA: Diagnosis not present

## 2021-01-22 NOTE — Progress Notes (Addendum)
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  I gathered all of the history from [name] at bedside.  Patient started not acting quite herself on Wednesday, but still was conversational.  When her son saw her Friday night around 6pm, she was slurring words and they called EMS.  On arrival to the ED, she was only able to follow directions intermittently, and was on room air.  Nonfocal exam.  She progressed to not being able to follow any directions, but she is responsive to noxious stimuli.  LP was done in the ED after Code stroke scans were negative but MRI showed possible encephalitis.  She has had increasing O2 requirements and upper airway gurgling, though no gross aspiration event was noted.  Because of concern for airway protection, hospitalist called ICU for admission.    She was found to be COVID positive, though exhibited no signs of COVID-19 illness.  She is fully vaccinated, and her son thinks she also got a booster.    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 There are also lateralized periodic epileptiform discharges, left hemisphere, maximal left centro-parietal region suggestive of underlying structural abnormality  PICC line 1/11>>1/13  Significant Diagnostic Tests:   MRI 1/7: 1. Abnormal T2/FLAIR signal  intensity involving the left insula and subinsular region, with extension to involve the mesial and anterior left temporal lobe, including the left hippocampus. Similar but less pronounced changes involve the right temporal lobe and insular cortex. Given provided history, finding is highly concerning for possible acute encephalitis, with herpes encephalitis a high consideration given the location of these findings.   MRI brain 1/9>>evolution of prior abnormal signal with decreased diffusion and increased vasogenic edema.  Micro Data:  CSF: protein 68, glucose 82, WBCs present. 5-9 WCs, lymphs  - Crypto Ag >>neg - HSV PCR >> POSITIVE - VZV PCR >>negative - CSF cx neg  SARS COV2 positive  Antimicrobials:  Vancomycin-1 dose 1/7 Ceftriaxone-1 dose 1/7 Ampicillin-one dose 1/7 Unasyn 1/8>> Acyclovir 1/7 >>  Interim History / Subjective:  Transitioned to comfort last evening  Objective   Blood pressure 105/64, pulse (!) 109, temperature 99 F (37.2 C), temperature source Oral, resp. rate 13, height 5\' 4"  (1.626 m), weight 59 kg, SpO2 (!) 59 %.        Intake/Output Summary (Last 24 hours) at 01-09-2021 0748 Last data filed at 01/03/2021 1800 Gross per 24 hour  Intake 591.43 ml  Output 1225 ml  Net -633.57 ml   Filed Weights   01/01/21 0438 01/02/21 0500 01/03/21 0600  Weight: 59 kg 59.4 kg 59 kg    Examination: General: acutely ill appearing HENT: atraumatic, dry MM Lungs: agonal breathing Cardiovascular: extremities cool, no motteling Abdomen: nondistended Extremities: no LE edema Neuro: unresponsive GU: foley  Resolved Hospital Problem list   Aspiration pneumonia s/p 5d tx  with unasyn 1/8-1/12  Assessment & Plan:   Comfort care only. Agonal breathing this morning. Son and pt's husband at bedside. Discussed dying process and expectations  -morphine gtt with 2mg  boluses hourly as needed -comfort care prn meds available -no IV/lab sticks -continue foley for urinary  retention -suspect death in <24 h, too unstable for tx to residential hospice  # Acute hypoxic respiratory failure # COVID 56 + # RUE DVT # HSV-1 Encephalitis. Repeat heat CT yesterday showed progressive cerebral edema # Encephalitis induced epilepsy.  -will continue keppra and valproate for comfort High risk Malnutrition--tube feeds d/c'd for comfort measures only  Chronic medical problems: # history of diffuse large B cell lymphoma # CHF, mildly reduced EF in 2015. Net +5L since admission.   Best practice (evaluated daily)  Diet: NPO Pain/Anxiety/Delirium protocol (if indicated): n/a VAP protocol (if indicated): n/a DVT prophylaxis: n/a GI prophylaxis: n/a Glucose control: n/a Mobility: BR Disposition: ICU  Goals of Care:  Last date of multidisciplinary goals of care discussion: 1/13 Family and staff present:  Nicoletta Dress, husband Summary of discussion: transitioned to comfort 1/13 Follow up goals of care discussion due: n/a Code Status: Comfort care only  Labs   CBC: Recent Labs  Lab 12/22/2020 2219 12/30/20 0831 12/31/20 0832 01/01/21 1303 01/02/21 0539 01/03/21 0412  WBC 15.1* 14.8* 16.1* 15.5* 14.0* 16.7*  NEUTROABS 12.6*  --   --   --   --   --   HGB 14.2 11.8* 12.9 12.9 12.8 13.2  HCT 44.1 37.7 40.1 37.7 37.6 41.2  MCV 92.1 93.8 90.7 87.5 88.3 90.4  PLT 225 168 160 206 209 778    Basic Metabolic Panel: Recent Labs  Lab 12/30/20 0831 12/30/20 1250 12/31/20 0832 12/31/20 1837 01/01/21 1303 01/01/21 1838 01/02/21 0539 01/03/21 0412  NA 143 144 142  --  144  --  141 139  K 3.1* 3.4* 3.3*  --  3.6  --  3.9 4.1  CL 110 110 107  --  112*  --  109 107  CO2 24 22 21*  --  21*  --  20* 21*  GLUCOSE 137* 197* 170*  --  132*  --  142* 160*  BUN 36* 36* 28*  --  19  --  15 16  CREATININE 0.93 0.92 0.78  --  0.74  --  0.61 0.59  CALCIUM 7.7* 7.9* 7.6*  --  7.4*  --  7.2* 7.6*  MG 2.5*  --   --  2.4 2.2 2.2 2.1  --   PHOS 1.9*  --   --  <1.0* 1.4* 3.4  2.1*  --    GFR: Estimated Creatinine Clearance: 52.5 mL/min (by C-G formula based on SCr of 0.59 mg/dL). Recent Labs  Lab 12/29/20 0338 12/29/20 0609 12/29/20 1331 12/30/20 0831 12/31/20 0832 01/01/21 1303 01/02/21 0539 01/03/21 0412  PROCALCITON  --  <0.10  --   --   --   --   --   --   WBC  --   --   --    < > 16.1* 15.5* 14.0* 16.7*  LATICACIDVEN 2.3*  --  2.3*  --   --   --   --   --    < > = values in this interval not displayed.    Liver Function Tests: Recent Labs  Lab 01/17/2021 2219 01/01/21 1303 01/02/21 0539 01/03/21 0412  AST 30 73* 96* 134*  ALT 21 63* 101* 175*  ALKPHOS 50  45 46 51  BILITOT 0.8 0.6 0.6 0.6  PROT 7.0 5.2* 4.9* 4.8*  ALBUMIN 3.7 1.9* 1.8* 1.8*   No results for input(s): LIPASE, AMYLASE in the last 168 hours. Recent Labs  Lab 01/03/21 1105  AMMONIA 21    ABG    Component Value Date/Time   TCO2 25 01/14/2021 [place], MD Internal Medicine Resident PGY-2 [facility] Pager: [phone] Jan 28, 2021 7:48 AM

## 2021-01-22 NOTE — Progress Notes (Addendum)
Time of death 36, verified with Almedia Balls RN.  Mitzi Hansen MD notified.

## 2021-01-22 NOTE — Death Summary Note (Addendum)
Patient ID: Tara Sloan MRN: [id number] DOB/AGE: 08/15/1946 75 y.o.  Admit date: 26-Jan-2021 Death date/time: 02-Feb-2021 [id number]  Admission Diagnoses: Encephalitis, COVID 19 pneumonia  Cause of Death:  Acute hypoxic respiratory failure  Pertinent Medical Diagnosis: Principal Problem:   Encephalitis Active Problems:   Sepsis ([place])   AMS (altered mental status)   Acute hypoxemic respiratory failure (HCC)   EKG abnormality   Acute encephalopathy   Herpes encephalitis   COVID-19 virus RNA test result positive at limit of detection   Cerebral edema [facility])   Hospital Course:  27 yof with dementia, HFmrEF, diffuse large B cell lymphoma in remission (2012) who presented with slurring of words, weakness and fever. MRI on admission was concerning for encephalitis. While in the ED, her respiratory declined and critical care was consulted. COVID 19 test was positive although had been asymptomatic in the preceding days. A lumbar puncture was preformed and antibiotics and antiviral therapy were started empirically   Lumbar puncture results returned with a +HSV-1 PCR.   Her mental status did not improve over the course of her hospitalization. On the day prior to death, she became unresponsive with right sided hemiplegia. A repeat head CT revealed worsening cerebral edema in the left insular region that had now extended to the left temporal and frontal lobes.   Her respiratory failure had initially improved however acutely worsened on the day prior to death. Repeat chest xray showed bibasilar infiltrates.  Furthermore, she was found to have a DVT in the right axillary vein on this same day.  After extensive discussion with her sons and her husband, she was transitioned to comfort care only on 02/01/21. She passed away on 02/02/21 with her son and husband at bedside.  Signed: Mitzi Hansen, MD Internal Medicine Resident PGY-2 [facility] Pager:  [phone] 02/02/2021 2:57 PM     Patient seen with resident. Reviewed note and agree with summary. Cause of death: Cerebral edema secondary herpes encephalitis. Covid positive with no respiratory symptoms.  Rodman Pickle, M.D. [facility] Pulmonary/Critical Care Medicine February 02, 2021 3:41 PM   Please see Amion for pager number to reach on-call Pulmonary and Critical Care Team.

## 2021-01-22 NOTE — Plan of Care (Signed)
Patient transitioned to [place]. Please call neurology as needed.  -- Amie Portland, MD Neurologist

## 2021-01-22 NOTE — Progress Notes (Signed)
60 mL morphine wasted in sink with Candace Cruise RN

## 2021-01-22 DEATH — deceased

## 2021-01-24 LAB — CULTURE, FUNGUS WITHOUT SMEAR

## 2021-11-22 ENCOUNTER — Ambulatory Visit: Payer: Medicare Other
